# Patient Record
Sex: Female | Born: 1970 | Race: White | Hispanic: No | Smoking: Former smoker
Health system: Southern US, Community
[De-identification: ages and names within clinical notes are randomized; demographics above are authoritative.]

## PROBLEM LIST (undated history)

## (undated) DIAGNOSIS — Z9889 Other specified postprocedural states: Secondary | ICD-10-CM

## (undated) DIAGNOSIS — Z915 Personal history of self-harm: Secondary | ICD-10-CM

## (undated) DIAGNOSIS — R112 Nausea with vomiting, unspecified: Secondary | ICD-10-CM

## (undated) DIAGNOSIS — Z973 Presence of spectacles and contact lenses: Secondary | ICD-10-CM

## (undated) DIAGNOSIS — M793 Panniculitis, unspecified: Secondary | ICD-10-CM

## (undated) DIAGNOSIS — K219 Gastro-esophageal reflux disease without esophagitis: Secondary | ICD-10-CM

## (undated) DIAGNOSIS — F419 Anxiety disorder, unspecified: Secondary | ICD-10-CM

## (undated) DIAGNOSIS — I10 Essential (primary) hypertension: Secondary | ICD-10-CM

## (undated) DIAGNOSIS — N641 Fat necrosis of breast: Secondary | ICD-10-CM

## (undated) DIAGNOSIS — M1990 Unspecified osteoarthritis, unspecified site: Secondary | ICD-10-CM

## (undated) DIAGNOSIS — F329 Major depressive disorder, single episode, unspecified: Secondary | ICD-10-CM

## (undated) DIAGNOSIS — Z8719 Personal history of other diseases of the digestive system: Secondary | ICD-10-CM

## (undated) DIAGNOSIS — L409 Psoriasis, unspecified: Secondary | ICD-10-CM

## (undated) DIAGNOSIS — J189 Pneumonia, unspecified organism: Secondary | ICD-10-CM

## (undated) HISTORY — DX: Unspecified osteoarthritis, unspecified site: M19.90

## (undated) HISTORY — PX: LAPAROSCOPIC REPAIR AND REMOVAL OF GASTRIC BAND: SHX5919

## (undated) HISTORY — PX: TOTAL ABDOMINAL HYSTERECTOMY W/ BILATERAL SALPINGOOPHORECTOMY: SHX83

## (undated) HISTORY — PX: ACHILLES TENDON REPAIR: SUR1153

## (undated) HISTORY — PX: COMBINED HYSTEROSCOPY DIAGNOSTIC / D&C: SUR297

## (undated) HISTORY — PX: LAPAROSCOPIC GASTRIC BANDING: SHX1100

## (undated) HISTORY — PX: CARPOMETACARPAL (CMC) FUSION OF THUMB: SHX6290

## (undated) HISTORY — PX: LAPAROSCOPIC CHOLECYSTECTOMY: SUR755

## (undated) HISTORY — PX: TONSILLECTOMY: SUR1361

---
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
## 1998-09-13 ENCOUNTER — Ambulatory Visit (HOSPITAL_COMMUNITY): Admission: RE | Admit: 1998-09-13 | Discharge: 1998-09-13 | Payer: Self-pay | Admitting: Family Medicine

## 1998-09-13 ENCOUNTER — Encounter: Payer: Self-pay | Admitting: Family Medicine

## 1999-08-19 ENCOUNTER — Other Ambulatory Visit: Admission: RE | Admit: 1999-08-19 | Discharge: 1999-08-19 | Payer: Self-pay | Admitting: Obstetrics and Gynecology

## 2000-01-31 ENCOUNTER — Encounter (INDEPENDENT_AMBULATORY_CARE_PROVIDER_SITE_OTHER): Payer: Self-pay

## 2000-01-31 ENCOUNTER — Inpatient Hospital Stay (HOSPITAL_COMMUNITY): Admission: RE | Admit: 2000-01-31 | Discharge: 2000-02-02 | Payer: Self-pay | Admitting: Obstetrics and Gynecology

## 2000-09-29 ENCOUNTER — Other Ambulatory Visit: Admission: RE | Admit: 2000-09-29 | Discharge: 2000-09-29 | Payer: Self-pay | Admitting: Obstetrics and Gynecology

## 2000-11-07 ENCOUNTER — Emergency Department (HOSPITAL_COMMUNITY): Admission: EM | Admit: 2000-11-07 | Discharge: 2000-11-07 | Payer: Self-pay | Admitting: Emergency Medicine

## 2000-12-05 ENCOUNTER — Emergency Department (HOSPITAL_COMMUNITY): Admission: EM | Admit: 2000-12-05 | Discharge: 2000-12-05 | Payer: Self-pay | Admitting: Emergency Medicine

## 2001-04-12 ENCOUNTER — Other Ambulatory Visit: Admission: RE | Admit: 2001-04-12 | Discharge: 2001-04-12 | Payer: Self-pay | Admitting: Obstetrics and Gynecology

## 2001-05-10 ENCOUNTER — Encounter (INDEPENDENT_AMBULATORY_CARE_PROVIDER_SITE_OTHER): Payer: Self-pay | Admitting: *Deleted

## 2001-05-10 ENCOUNTER — Ambulatory Visit (HOSPITAL_BASED_OUTPATIENT_CLINIC_OR_DEPARTMENT_OTHER): Admission: RE | Admit: 2001-05-10 | Discharge: 2001-05-10 | Payer: Self-pay | Admitting: Plastic Surgery

## 2001-08-20 ENCOUNTER — Other Ambulatory Visit: Admission: RE | Admit: 2001-08-20 | Discharge: 2001-08-20 | Payer: Self-pay | Admitting: Obstetrics and Gynecology

## 2002-06-29 ENCOUNTER — Encounter: Payer: Self-pay | Admitting: Obstetrics and Gynecology

## 2002-06-29 ENCOUNTER — Ambulatory Visit (HOSPITAL_COMMUNITY): Admission: RE | Admit: 2002-06-29 | Discharge: 2002-06-29 | Payer: Self-pay | Admitting: Obstetrics and Gynecology

## 2002-07-10 ENCOUNTER — Ambulatory Visit (HOSPITAL_COMMUNITY): Admission: RE | Admit: 2002-07-10 | Discharge: 2002-07-10 | Payer: Self-pay | Admitting: Obstetrics and Gynecology

## 2002-07-10 ENCOUNTER — Encounter: Payer: Self-pay | Admitting: Obstetrics and Gynecology

## 2002-09-21 ENCOUNTER — Ambulatory Visit (HOSPITAL_COMMUNITY): Admission: RE | Admit: 2002-09-21 | Discharge: 2002-09-21 | Payer: Self-pay | Admitting: Family Medicine

## 2002-09-21 ENCOUNTER — Encounter: Payer: Self-pay | Admitting: Family Medicine

## 2002-10-19 ENCOUNTER — Observation Stay (HOSPITAL_COMMUNITY): Admission: RE | Admit: 2002-10-19 | Discharge: 2002-10-20 | Payer: Self-pay

## 2002-10-19 ENCOUNTER — Encounter (INDEPENDENT_AMBULATORY_CARE_PROVIDER_SITE_OTHER): Payer: Self-pay | Admitting: Specialist

## 2002-11-23 ENCOUNTER — Emergency Department (HOSPITAL_COMMUNITY): Admission: EM | Admit: 2002-11-23 | Discharge: 2002-11-23 | Payer: Self-pay | Admitting: Emergency Medicine

## 2002-11-25 ENCOUNTER — Encounter: Admission: RE | Admit: 2002-11-25 | Discharge: 2002-11-25 | Payer: Self-pay | Admitting: Surgery

## 2002-11-25 ENCOUNTER — Encounter: Payer: Self-pay | Admitting: Surgery

## 2002-11-28 ENCOUNTER — Encounter: Admission: RE | Admit: 2002-11-28 | Discharge: 2002-11-28 | Payer: Self-pay | Admitting: Surgery

## 2002-11-28 ENCOUNTER — Encounter: Payer: Self-pay | Admitting: Surgery

## 2003-03-13 ENCOUNTER — Ambulatory Visit (HOSPITAL_COMMUNITY): Admission: RE | Admit: 2003-03-13 | Discharge: 2003-03-13 | Payer: Self-pay | Admitting: Obstetrics and Gynecology

## 2003-06-23 ENCOUNTER — Encounter (INDEPENDENT_AMBULATORY_CARE_PROVIDER_SITE_OTHER): Payer: Self-pay | Admitting: Specialist

## 2003-06-23 ENCOUNTER — Ambulatory Visit (HOSPITAL_COMMUNITY): Admission: RE | Admit: 2003-06-23 | Discharge: 2003-06-23 | Payer: Self-pay | Admitting: Obstetrics and Gynecology

## 2003-09-13 ENCOUNTER — Encounter: Admission: RE | Admit: 2003-09-13 | Discharge: 2003-12-12 | Payer: Self-pay | Admitting: *Deleted

## 2003-09-18 ENCOUNTER — Ambulatory Visit (HOSPITAL_COMMUNITY): Admission: RE | Admit: 2003-09-18 | Discharge: 2003-09-18 | Payer: Self-pay | Admitting: Surgery

## 2003-09-25 ENCOUNTER — Inpatient Hospital Stay (HOSPITAL_COMMUNITY): Admission: RE | Admit: 2003-09-25 | Discharge: 2003-09-27 | Payer: Self-pay | Admitting: *Deleted

## 2003-12-08 ENCOUNTER — Ambulatory Visit (HOSPITAL_COMMUNITY): Admission: RE | Admit: 2003-12-08 | Discharge: 2003-12-08 | Payer: Self-pay | Admitting: Obstetrics and Gynecology

## 2003-12-26 ENCOUNTER — Encounter (INDEPENDENT_AMBULATORY_CARE_PROVIDER_SITE_OTHER): Payer: Self-pay | Admitting: Specialist

## 2003-12-26 ENCOUNTER — Inpatient Hospital Stay (HOSPITAL_COMMUNITY): Admission: RE | Admit: 2003-12-26 | Discharge: 2003-12-28 | Payer: Self-pay | Admitting: Obstetrics and Gynecology

## 2004-02-27 ENCOUNTER — Ambulatory Visit (HOSPITAL_COMMUNITY): Admission: RE | Admit: 2004-02-27 | Discharge: 2004-02-27 | Payer: Self-pay | Admitting: Obstetrics and Gynecology

## 2004-04-07 HISTORY — PX: VAGOTOMY: SUR1431

## 2004-04-23 ENCOUNTER — Encounter: Admission: RE | Admit: 2004-04-23 | Discharge: 2004-04-23 | Payer: Self-pay | Admitting: *Deleted

## 2004-04-23 ENCOUNTER — Inpatient Hospital Stay (HOSPITAL_COMMUNITY): Admission: AD | Admit: 2004-04-23 | Discharge: 2004-04-25 | Payer: Self-pay | Admitting: *Deleted

## 2004-05-02 ENCOUNTER — Encounter: Admission: RE | Admit: 2004-05-02 | Discharge: 2004-07-31 | Payer: Self-pay | Admitting: *Deleted

## 2004-06-17 ENCOUNTER — Encounter: Admission: RE | Admit: 2004-06-17 | Discharge: 2004-06-17 | Payer: Self-pay | Admitting: *Deleted

## 2004-08-22 ENCOUNTER — Ambulatory Visit (HOSPITAL_COMMUNITY): Admission: RE | Admit: 2004-08-22 | Discharge: 2004-08-22 | Payer: Self-pay | Admitting: Surgery

## 2004-08-29 ENCOUNTER — Encounter: Admission: RE | Admit: 2004-08-29 | Discharge: 2004-08-29 | Payer: Self-pay | Admitting: *Deleted

## 2004-09-20 ENCOUNTER — Encounter: Admission: RE | Admit: 2004-09-20 | Discharge: 2004-12-19 | Payer: Self-pay | Admitting: *Deleted

## 2004-11-05 ENCOUNTER — Inpatient Hospital Stay (HOSPITAL_COMMUNITY): Admission: RE | Admit: 2004-11-05 | Discharge: 2004-11-07 | Payer: Self-pay | Admitting: *Deleted

## 2004-11-05 HISTORY — PX: OTHER SURGICAL HISTORY: SHX169

## 2004-12-19 ENCOUNTER — Encounter: Admission: RE | Admit: 2004-12-19 | Discharge: 2004-12-19 | Payer: Self-pay | Admitting: *Deleted

## 2004-12-20 ENCOUNTER — Ambulatory Visit (HOSPITAL_COMMUNITY): Admission: RE | Admit: 2004-12-20 | Discharge: 2004-12-20 | Payer: Self-pay | Admitting: Surgery

## 2005-02-21 ENCOUNTER — Encounter: Admission: RE | Admit: 2005-02-21 | Discharge: 2005-04-06 | Payer: Self-pay | Admitting: *Deleted

## 2005-04-18 ENCOUNTER — Encounter: Admission: RE | Admit: 2005-04-18 | Discharge: 2005-07-17 | Payer: Self-pay | Admitting: *Deleted

## 2005-11-18 ENCOUNTER — Encounter: Admission: RE | Admit: 2005-11-18 | Discharge: 2005-11-18 | Payer: Self-pay | Admitting: *Deleted

## 2005-11-27 ENCOUNTER — Ambulatory Visit (HOSPITAL_COMMUNITY): Admission: RE | Admit: 2005-11-27 | Discharge: 2005-11-27 | Payer: Self-pay | Admitting: Obstetrics and Gynecology

## 2005-12-02 ENCOUNTER — Ambulatory Visit: Admission: RE | Admit: 2005-12-02 | Discharge: 2005-12-02 | Payer: Self-pay | Admitting: Gynecologic Oncology

## 2005-12-23 ENCOUNTER — Inpatient Hospital Stay (HOSPITAL_COMMUNITY): Admission: RE | Admit: 2005-12-23 | Discharge: 2005-12-25 | Payer: Self-pay | Admitting: Obstetrics and Gynecology

## 2005-12-23 ENCOUNTER — Encounter (INDEPENDENT_AMBULATORY_CARE_PROVIDER_SITE_OTHER): Payer: Self-pay | Admitting: *Deleted

## 2006-01-19 ENCOUNTER — Ambulatory Visit (HOSPITAL_COMMUNITY): Admission: RE | Admit: 2006-01-19 | Discharge: 2006-01-19 | Payer: Self-pay | Admitting: Surgery

## 2006-01-26 ENCOUNTER — Inpatient Hospital Stay (HOSPITAL_COMMUNITY): Admission: EM | Admit: 2006-01-26 | Discharge: 2006-01-28 | Payer: Self-pay | Admitting: Emergency Medicine

## 2006-02-05 ENCOUNTER — Inpatient Hospital Stay (HOSPITAL_COMMUNITY): Admission: RE | Admit: 2006-02-05 | Discharge: 2006-02-09 | Payer: Self-pay | Admitting: Surgery

## 2008-02-13 ENCOUNTER — Emergency Department (HOSPITAL_COMMUNITY): Admission: EM | Admit: 2008-02-13 | Discharge: 2008-02-13 | Payer: Self-pay | Admitting: Emergency Medicine

## 2008-02-21 ENCOUNTER — Encounter: Admission: RE | Admit: 2008-02-21 | Discharge: 2008-02-21 | Payer: Self-pay | Admitting: Family Medicine

## 2008-11-16 ENCOUNTER — Emergency Department (HOSPITAL_COMMUNITY): Admission: EM | Admit: 2008-11-16 | Discharge: 2008-11-16 | Payer: Self-pay | Admitting: Podiatry

## 2009-08-30 ENCOUNTER — Emergency Department (HOSPITAL_COMMUNITY): Admission: EM | Admit: 2009-08-30 | Discharge: 2009-08-30 | Payer: Self-pay | Admitting: Emergency Medicine

## 2009-12-17 ENCOUNTER — Inpatient Hospital Stay (HOSPITAL_COMMUNITY): Admission: EM | Admit: 2009-12-17 | Discharge: 2009-12-30 | Payer: Self-pay | Admitting: Emergency Medicine

## 2009-12-27 ENCOUNTER — Encounter (INDEPENDENT_AMBULATORY_CARE_PROVIDER_SITE_OTHER): Payer: Self-pay | Admitting: Surgery

## 2009-12-27 HISTORY — PX: UMBILICAL HERNIA REPAIR: SHX196

## 2010-06-20 LAB — BASIC METABOLIC PANEL
BUN: 5 mg/dL — ABNORMAL LOW (ref 6–23)
BUN: 5 mg/dL — ABNORMAL LOW (ref 6–23)
CO2: 26 mEq/L (ref 19–32)
CO2: 30 mEq/L (ref 19–32)
CO2: 31 mEq/L (ref 19–32)
CO2: 31 mEq/L (ref 19–32)
CO2: 31 mEq/L (ref 19–32)
Calcium: 8.6 mg/dL (ref 8.4–10.5)
Calcium: 8.7 mg/dL (ref 8.4–10.5)
Calcium: 8.7 mg/dL (ref 8.4–10.5)
Calcium: 8.7 mg/dL (ref 8.4–10.5)
Calcium: 9 mg/dL (ref 8.4–10.5)
Calcium: 9 mg/dL (ref 8.4–10.5)
Chloride: 102 mEq/L (ref 96–112)
Chloride: 103 mEq/L (ref 96–112)
Chloride: 105 mEq/L (ref 96–112)
Chloride: 105 mEq/L (ref 96–112)
Chloride: 107 mEq/L (ref 96–112)
Creatinine, Ser: 1 mg/dL (ref 0.4–1.2)
Creatinine, Ser: 1.06 mg/dL (ref 0.4–1.2)
Creatinine, Ser: 1.14 mg/dL (ref 0.4–1.2)
GFR calc Af Amer: >60 mL/min (ref >60)
GFR calc Af Amer: >60 mL/min (ref >60)
GFR calc Af Amer: >60 mL/min (ref >60)
GFR calc Af Amer: >60 mL/min (ref >60)
GFR calc Af Amer: >60 mL/min (ref >60)
GFR calc Af Amer: >60 mL/min (ref >60)
GFR calc non Af Amer: >60 mL/min (ref >60)
Glucose, Bld: 112 mg/dL — ABNORMAL HIGH (ref 70–99)
Glucose, Bld: 118 mg/dL — ABNORMAL HIGH (ref 70–99)
Potassium: 4 mEq/L (ref 3.5–5.1)
Potassium: 4.2 mEq/L (ref 3.5–5.1)
Potassium: 4.6 mEq/L (ref 3.5–5.1)
Sodium: 139 mEq/L (ref 135–145)
Sodium: 140 mEq/L (ref 135–145)
Sodium: 140 mEq/L (ref 135–145)
Sodium: 142 mEq/L (ref 135–145)
Sodium: 142 mEq/L (ref 135–145)

## 2010-06-20 LAB — CBC
HCT: 39.1 % (ref 36.0–46.0)
HCT: 39.8 % (ref 36.0–46.0)
HCT: 44.5 % (ref 36.0–46.0)
Hemoglobin: 13.5 g/dL (ref 12.0–15.0)
Hemoglobin: 13.8 g/dL (ref 12.0–15.0)
Hemoglobin: 13.9 g/dL (ref 12.0–15.0)
Hemoglobin: 14.7 g/dL (ref 12.0–15.0)
Hemoglobin: 15.6 g/dL — ABNORMAL HIGH (ref 12.0–15.0)
MCH: 33 pg (ref 26.0–34.0)
MCH: 33.1 pg (ref 26.0–34.0)
MCH: 33.2 pg (ref 26.0–34.0)
MCH: 33.5 pg (ref 26.0–34.0)
MCH: 33.4 pg (ref 26.0–34.0)
MCHC: 34.2 g/dL (ref 30.0–36.0)
MCHC: 34.5 g/dL (ref 30.0–36.0)
MCHC: 34.9 g/dL (ref 30.0–36.0)
MCV: 95.9 fL (ref 78.0–100.0)
MCV: 96.3 fL (ref 78.0–100.0)
MCV: 96.4 fL (ref 78.0–100.0)
MCV: 96.8 fL (ref 78.0–100.0)
MCV: 95.6 fL (ref 78.0–100.0)
MCV: 95.6 fL (ref 78.0–100.0)
MCV: 96.5 fL (ref 78.0–100.0)
Platelets: 164 10*3/uL (ref 150–400)
Platelets: 173 10*3/uL (ref 150–400)
Platelets: 183 10*3/uL (ref 150–400)
Platelets: 186 10*3/uL (ref 150–400)
Platelets: 164 10*3/uL (ref 150–400)
Platelets: 170 10*3/uL (ref 150–400)
RBC: 4.04 MIL/uL (ref 3.87–5.11)
RBC: 4.06 MIL/uL (ref 3.87–5.11)
RBC: 4.14 MIL/uL (ref 3.87–5.11)
RBC: 4.14 MIL/uL (ref 3.87–5.11)
RBC: 4.38 MIL/uL (ref 3.87–5.11)
RBC: 4.26 MIL/uL (ref 3.87–5.11)
RBC: 4.66 MIL/uL (ref 3.87–5.11)
RDW: 13.6 % (ref 11.5–15.5)
RDW: 12.8 % (ref 11.5–15.5)
RDW: 13.6 % (ref 11.5–15.5)
WBC: 6.5 10*3/uL (ref 4.0–10.5)
WBC: 6.9 10*3/uL (ref 4.0–10.5)
WBC: 7.4 10*3/uL (ref 4.0–10.5)
WBC: 7.7 10*3/uL (ref 4.0–10.5)
WBC: 7.9 10*3/uL (ref 4.0–10.5)

## 2010-06-20 LAB — DIFFERENTIAL
Basophils Absolute: 0 10*3/uL (ref 0.0–0.1)
Basophils Absolute: 0.1 10*3/uL (ref 0.0–0.1)
Basophils Relative: 1 % (ref 0–1)
Eosinophils Absolute: 0.2 10*3/uL (ref 0.0–0.7)
Eosinophils Absolute: 0.2 10*3/uL (ref 0.0–0.7)
Eosinophils Absolute: 0.2 10*3/uL (ref 0.0–0.7)
Eosinophils Relative: 4 % (ref 0–5)
Eosinophils Relative: 2 % (ref 0–5)
Eosinophils Relative: 3 % (ref 0–5)
Lymphocytes Relative: 36 % (ref 12–46)
Lymphocytes Relative: 28 % (ref 12–46)
Lymphs Abs: 2.4 10*3/uL (ref 0.7–4.0)
Lymphs Abs: 2.8 10*3/uL (ref 0.7–4.0)
Lymphs Abs: 2.9 10*3/uL (ref 0.7–4.0)
Monocytes Absolute: 0.4 10*3/uL (ref 0.1–1.0)
Monocytes Relative: 5 % (ref 3–12)
Neutrophils Relative %: 54 % (ref 43–77)
Neutrophils Relative %: 65 % (ref 43–77)

## 2010-06-20 LAB — URINE CULTURE: Culture  Setup Time: 201109140522

## 2010-06-20 LAB — URINALYSIS, ROUTINE W REFLEX MICROSCOPIC
Bilirubin Urine: NEGATIVE
Glucose, UA: NEGATIVE mg/dL
Glucose, UA: NEGATIVE mg/dL
Hgb urine dipstick: NEGATIVE
Ketones, ur: NEGATIVE mg/dL
Leukocytes, UA: NEGATIVE
Protein, ur: NEGATIVE mg/dL
pH: 5 (ref 5.0–8.0)
pH: 5.5 (ref 5.0–8.0)

## 2010-06-20 LAB — COMPREHENSIVE METABOLIC PANEL
ALT: 23 U/L (ref 0–35)
AST: 25 U/L (ref 0–37)
Albumin: 3.2 g/dL — ABNORMAL LOW (ref 3.5–5.2)
Alkaline Phosphatase: 60 U/L (ref 39–117)
BUN: 10 mg/dL (ref 6–23)
CO2: 28 mEq/L (ref 19–32)
Calcium: 9 mg/dL (ref 8.4–10.5)
Chloride: 105 mEq/L (ref 96–112)
Chloride: 107 mEq/L (ref 96–112)
Creatinine, Ser: 0.93 mg/dL (ref 0.4–1.2)
GFR calc non Af Amer: >60 mL/min (ref >60)
Glucose, Bld: 95 mg/dL (ref 70–99)
Potassium: 4 mEq/L (ref 3.5–5.1)
Sodium: 140 mEq/L (ref 135–145)
Total Bilirubin: 0.4 mg/dL (ref 0.3–1.2)
Total Bilirubin: 0.7 mg/dL (ref 0.3–1.2)
Total Protein: 6.2 g/dL (ref 6.0–8.3)

## 2010-06-20 LAB — LIPASE, BLOOD: Lipase: 22 U/L (ref 11–59)

## 2010-07-13 LAB — CBC
Platelets: 129 10*3/uL — ABNORMAL LOW (ref 150–400)
RDW: 12.4 % (ref 11.5–15.5)
WBC: 11.3 10*3/uL — ABNORMAL HIGH (ref 4.0–10.5)

## 2010-07-13 LAB — BASIC METABOLIC PANEL
BUN: 11 mg/dL (ref 6–23)
Calcium: 8.9 mg/dL (ref 8.4–10.5)
GFR calc non Af Amer: >60 mL/min (ref >60)
Glucose, Bld: 99 mg/dL (ref 70–99)
Sodium: 137 mEq/L (ref 135–145)

## 2010-07-13 LAB — DIFFERENTIAL
Basophils Absolute: 0.1 10*3/uL (ref 0.0–0.1)
Lymphocytes Relative: 21 % (ref 12–46)
Neutro Abs: 8.2 10*3/uL — ABNORMAL HIGH (ref 1.7–7.7)

## 2010-08-23 NOTE — Op Note (Signed)
[name], [name]              ACCOUNT NO.:  [id number]   MEDICAL RECORD NO.:  [id number]          PATIENT TYPE:  AMB   LOCATION:  ENDO                         FACILITY:  [facility]   PHYSICIAN:  Sandria Bales. Ezzard Standing, M.D.  DATE OF BIRTH:  1970/08/14   DATE OF PROCEDURE:  12/20/2004  DATE OF DISCHARGE:                                 OPERATIVE REPORT   PREOPERATIVE DIAGNOSIS:  Status post laparoscopic banding procedure and  repair of a hiatal hernia x3 with what appears to be pouch outlet  obstruction.   POSTOPERATIVE DIAGNOSIS:  1.  Status post laparoscopic banding procedure with history of hiatal hernia      repair.  2.  Mildly dilated gastric pouch with esophagogastric junction at      approximately 40 cm, and outlet to pouch at about 45 cm.   PROCEDURE:  Esophagogastroscopy with dilatation to 15 mm using a balloon  dilator.  Access lap band port.   SURGEON:  Sandria Bales. Ezzard Standing, M.D.   FIRST ASSISTANT:  None.   ANESTHESIA:  Demerol 50 mg, Versed 5 mg.   INDICATIONS FOR PROCEDURE:  [name] is a 40 year old white female who  underwent a laparoscopic repair of a hiatal hernia and laparoscopic  adjustable gastric banding by Dr. Luan Pulling in June, 2005.  She had a  recurrence of a hiatal hernia, and [name] repaired this laparoscopically  of January, 2006.  She had another recurrence of a hiatal hernia, and this  was repaired for the third time on November 05, 2004, so she is approximately  six weeks postop.  As far as I am aware, the band has never been replaced  since it was initially placed.   The band has also not been filled post op.  I accessed the port to the lap  band before beginning the endoscopy to confirm that there was no fluid in th  band.  And there was no fluid in the band.   She has had successful weight loss from her bariatric surgery which exceeded  110 pounds, but over the last week, she has had progressive nausea and  vomiting of almost everything except  for the most thin of fluids.   OPERATIVE NOTE:  She presents to the endoscopy suite for upper endoscopy.  She has been n.p.o.  The indications and potential complications have  already been explained to the patient.  An IV is started in her right wrist,  and she is monitored with a pulse oximetry, blood pressure cuff, EKG, and  had nasal oxygen flow during the procedure.   I anesthetized the back of her throat with Cetacaine.  I used an Olympus  endoscope which was passed down her esophagus into the gastric pouch and  into the stomach and beyond into the pylorus and duodenum.   The duodenum was normal.  The pylorus, although somewhat tight, was normal  without ulcer or mass.  Her stomach pouch was unremarkable.  I retroflexed  the scope in the stomach.  I could see the imprint of the band on the  proximal stomach but saw no ulcers or  abnormality of the band location.  As  I pulled the scope through the opening between the proximal gastric patch  and the stomach, this was narrowed but not overly tight.  Actually, the  scope went in fairly easily.  The Olympus endoscope is approximately 9 mm in  diamter.  I saw no ulcer.  No evidence of erosion.  The gastric pouch itself  was mildly dilated, but I would say minimally so.   Again, the outlet of her pouch was at 45.  Her esophagogastric junction was  at 40.  I saw no evidence of a hiatel hernia.  I took a 15 mm dilating  balloon, blew it up in the stomach, pulled it back into the outlet between  the gastric pouch and stomach.  The balloon actually came through fairly  easily.  I left the balloon in the lumen of the opening betweeen the pouch  and stomach for about 90 seconds to try to dilate the opening.  [name]  tolerated the procedure well.   I would be very hesitant to take her band down at this time to see how she  does with her diet.  She is already on an antiulcer medicine, which she will  continue.   I talked to Durenda Hurt at the end of the procedure.  Her family was  paged, but was never available.  I will speak to Dr. Luan Pulling about these  findings also.   [Her husband did call me and I talked to him regarding the findings.]      Sandria Bales. Ezzard Standing, M.D.  Electronically Signed     DHN/MEDQ  D:  12/20/2004  T:  12/20/2004  Job:  [id number]   cc:   Vikki Ports, MD  [address], [address]  [place]  [place] [address]

## 2010-08-23 NOTE — Op Note (Signed)
NAMEKIMORA, [name]              ACCOUNT NO.:  [id number]   MEDICAL RECORD NO.:  [id number]          PATIENT TYPE:  OBV   LOCATION:  0098                         FACILITY:  [facility]   PHYSICIAN:  Thornton Park. Daphine Deutscher, MD  DATE OF BIRTH:  03-17-1971   DATE OF PROCEDURE:  04/23/2004  DATE OF DISCHARGE:                                 OPERATIVE REPORT   PREOPERATIVE DIAGNOSIS:  Recurrent hiatal hernia in a patient with a  laparoscopic gastric band.   POSTOPERATIVE DIAGNOSIS:  Recurrent type 3 mixed hiatal hernia in a  laparoscopic gastric band patient.   PROCEDURES:  Laparoscopic repair of type 3 hiatal hernia with pledgeted  sutures posteriorly and a micromesh Gore-Tex patch anteriorly.   SURGEON:  Thornton Park. Daphine Deutscher, M.D.   ASSISTANTS:  1.  Sandria Bales. Ezzard Standing, M.D.  2.  Vikki Ports, M.D.   ANESTHESIA:  General endotracheal.   ESTIMATED BLOOD LOSS:  35 mL.   COMPLICATIONS:  None noted.   ANCILLARY PROCEDURES:  Endoscopy per Dr. Ezzard Standing dictated separately.   DESCRIPTION OF PROCEDURE:  [name] is a 40 year old lady who  previously had a large hiatal hernia which was repaired at the same time as  she underwent laparoscopic gastric banding.  This was done back in June of  2005.  Apparently over the weekend she got some kind of virus with nausea  and vomiting and then had protracted abdominal pain.  She had an upper GI  which was reviewed by me and Dr. Luan Pulling and was found to be consistent  with recurrent hiatal hernia with stomach about the band and the chest.   The patient had actually been scheduled and permitted by Dr. Luan Pulling, who  was operating, but has had some orthopedic injuries and as her assistant and  partner, she asked me to perform the case.  Thus, after we initially put in  our trocars for the case, I went ahead and assumed that responsibility.  Access to the abdomen was gained with an Optiview technique in the left  upper quadrant.  Then  multiple ports were placed to avoid the previously  placed port for the laparoscopic gastric band.  A Nathanson retractor was  inserted.  We first began by taking down a lot of adhesions to the liver and  then delineating the right and left crux and dissecting free the hernia sac  and getting the herniated stomach out of the chest.  I was then able to  dissected posteriorly, put a Penrose drain around the esophagogastric  junction above the band and thereby able to further dissect the crura.  Once  this had been performed, I felt like the stomach was in the abdomen and the  esophagus was freed and was under no tension.  No pledgets had been used  before and I placed the three pledgets posteriorly, the sutures  approximately in the posterior crura, but there still remained a very large  anterior crural defect.  It was in fact anteriorly where her hernia had  recurred.   I felt that I would be unable to approximate this primarily with  pledgets  and sutures.  I cut a piece of micromesh Gore-Tex, placing the smooth side  in the abdomen and sutured it to the parameter with approximately six  sutures of Surgitek using the EndoStitch.  Then I sutured it down onto the  esophagus with Vicryl.  A nice repair was done.  At the end of the case, Dr.  Ezzard Standing endoscoped the patient.  We submerged it and there was no evidence of  any leaks.  Endoscopically everything appeared to be in order and the  stomach appeared to be below the diaphragm.  The abdomen was then deflated.  The patient was awakened and taken to the recovery room in satisfactory  condition.     Matt   MBM/MEDQ  D:  04/23/2004  T:  04/23/2004  Job:  [id number]

## 2010-08-23 NOTE — Op Note (Signed)
[name], PHILSON              ACCOUNT NO.:  [id number]   MEDICAL RECORD NO.:  [id number]          PATIENT TYPE:  INP   LOCATION:  1603                         FACILITY:  [facility]   PHYSICIAN:  Thornton Park. Daphine Deutscher, MD  DATE OF BIRTH:  26-Sep-1970   DATE OF PROCEDURE:  02/06/2006  DATE OF DISCHARGE:                                 OPERATIVE REPORT   PREOPERATIVE DIAGNOSIS:  Lap band with reflux and possible prolapse.   POSTOPERATIVE DIAGNOSIS:  Lap band with reflux and possible prolapse, lower  midline ventral hernia from prior Pfannenstiel incision and hernia from  upper abdominal port which was closed.   PROCEDURE:  Removal of lap band.   SURGEON:  [name]   ASSISTANT:  Dr. Ezzard Standing   DESCRIPTION OF PROCEDURE:  [name] was taken to room 1 on February 06, 2006, given general anesthesia.  The abdomen was prepped with chlorhexidine  and draped sterilely.  Access to the abdomen was gained through the upper  midline below the xiphoid with a 5 mm Optiview and insufflating and then  once insufflated, placing 11 mm ports below.  This enabled me to visualize  the band area.  There was a stroma which stuck out through the anterior  abdominal wall where I had previously plicated it, and the band was readily  visible and appeared that maybe it had slipped or she had herniated above  it.  We worked down on the port to the collar, and I was unable to unsnap  the collar and then pull out the band.  It was brought out in pieces, but it  came out nevertheless.  The stomach was left in situ.  The port site and the  band were removed as well as the tubing.  There was a dimple in the fascia  at this port site, and I went ahead and approximated it with a single #1  Novofil done under laparoscopic vision.  No bleeding or injuries were noted.  The abdomen was deflated, and the wounds were closed with 4-0 Vicryl with  Benzoin and Steri-Strips.  The patient seemed to tolerate the procedure well  and  was taken to the recovery room in satisfactory condition.      Thornton Park Daphine Deutscher, MD  Electronically Signed     MBM/MEDQ  D:  02/06/2006  T:  02/06/2006  Job:  [id number]

## 2010-08-23 NOTE — Op Note (Signed)
[name], [name]              ACCOUNT NO.:  [id number]   MEDICAL RECORD NO.:  [id number]          PATIENT TYPE:  AMB   LOCATION:  ENDO                         FACILITY:  [facility]   PHYSICIAN:  Sandria Bales. Ezzard Standing, M.D.  DATE OF BIRTH:  1971-04-01   DATE OF PROCEDURE:  01/19/2006  DATE OF DISCHARGE:                                 OPERATIVE REPORT   PREOPERATIVE DIAGNOSIS:  Nausea and vomiting, status post lap Band and prior  hiatel hernia repair.   POSTOPERATIVE DIAGNOSES:  1. Distal esophagitis with small ulcers.  2. Retained fluid in gastric pouch (consistent with poor gastric      emptying).  3. Tight, but not occluded gastrice orifice at level of Lap Band.  4. Bile reflux into stomach.  5. Gastroesophageal junction at 40 cm, with minimal, if any, hiatal      hernia.   OPERATION PERFORMED:  Upper esophagogastroduodenoscopy.   SURGEON:  Sandria Bales. Ezzard Standing, M.D.   FIRST ASSISTANT:  None.   ANESTHESIA:  Demerol 50 mg, Versed 4 mg.   INDICATIONS FOR PROCEDURE:  [name] is a 40 year old white female, who  underwent an initial Lap-Band with a hiatal hernia repair in June 2005 for  morbid obesity.  This was done by Dr. Colin Benton.  She underwent her first  revision by Dr. Wenda Low in January 2006, and a second revision in August  2006, again [name].  I last endoscoped [name] on 20 December 2004.  She has had persistent nausea and vomiting daily with most meals that she  eats.  She has sustained weight loss and is at 169 pounds; so, from a weight  loss standpoint, her surgery has been successful.  But from a chronic nausea  and vomiting standpoint, her surgery has been unsuccessful.   She underwent an upper GI on 14 August, which showed a Band, which appeared  to be in good position.  There was a somewhat generous pouch and trickling  emptying of her gastric outlet.   The patient now comes for upper endoscopy to try to better document, if  possible, what is going on  with her band/pouch.   OPERATIVE NOTE:  The patient was in a supine position with the back of her  throat anesthetized with Cetacaine spray, being rolled to her left side.  She had a pulse oximetry, EKG, blood pressure cuff, and was given nasal O2  during the procedure.   A flexible Olympus endoscope was passed into the back of her throat without  difficulty.  This was advanced down her esophagus.  I got into her stomach  pouch, which seemed large from a Band standpoint; and my guess is that at  least some gastric pouch dilatation has occured due to her repetitive nausea  and vomiting.  I was able to advance my scope through her gastrice orifice  of her Band.  Though it was tight, it permitted a scope, which was about 10  mm in diameter to go through without a lot of resistance.   Upon entering the stomach, I advanced into the duodenum, which is normal.  But in the stomach, I did notice that she had bile reflux in her stomach,  which is again consistent with her repetitive retching and vomiting.  I  retroflexed the scope, but I could see nothing wrong from the underneath  side of the Band, and on pulling through the Band, I saw no evidence of  ulceration in the band channel.  However, when I got back into the  stomach, it was noted the patient had a dilated gastric pouch.  When I first  entered the stomach, she did have retained fluid, which showed the pouch was  not emptying well.  Her Band placement was at about 45 cm from the incisors.  Her gastroesophageal junction was at 40 cm.  She did have some superficial  ulceration of her distal esophagus, consistent with gastroesophageal reflux  disease.   It is hard to know what to suggest for Colleen Wilshire.  She has had successful  weight loss, but clearly is compromised by her chronic nausea and vomiting.  I am not sure anything can be done with the Band that is in place to make it  behave better.  My guess is that she may even have  slippage of the Band, or  she has pouch dilatation.  She has already had three operations of her upper  abdomen, so surgically adjusting her Band placement will not be easy.  I  guess the question is, do we take the whole Band out and see how she does  with it out, or to try to do some kind of revision such as for a slip band,  replace her band with a larger band, or to try to do a bypass on her.  But I  do not have an answer to these questions at this time.      Sandria Bales. Ezzard Standing, M.D.  Electronically Signed     DHN/MEDQ  D:  01/19/2006  T:  01/19/2006  Job:  [id number]   cc:   Ernestina Penna, M.D.  Thornton Park Daphine Deutscher, MD

## 2010-08-23 NOTE — Op Note (Signed)
NAMEBILLEE, BALCERZAK              ACCOUNT NO.:  [id number]   MEDICAL RECORD NO.:  [id number]          PATIENT TYPE:  AMB   LOCATION:  ENDO                         [facility]AC[facility]   PHYSICIAN:  Sandria Bales. Ezzard Standing, M.D.  DATE OF BIRTH:  [birth year]   DATE OF PROCEDURE:  08/22/2004  DATE OF DISCHARGE:                                 OPERATIVE REPORT   PREOPERATIVE DIAGNOSES:  Status post laparoscopic banding procedure and  repair of hiatal hernia with reflux esophagitis symptoms.   POSTOPERATIVE DIAGNOSIS:  1.  Status post laparoscopic banding procedure with history of repair of      hiatal hernia.  2.  Dilated gastric pouch with esophagogastric junction at 35 cm, and severe      erosive esophagitis of distal esophagus.   PROCEDURE:  Esophagogastroduodenoscopy with CLO biopsy of proximal stomach  pouch.   SURGEON:  Dr. Ezzard Standing   FIRST ASSISTANT:  None.   ANESTHESIA:  1.  Demerol 50 mg.  2.  Versed 5 mg.   COMPLICATIONS:  None.   INDICATIONS FOR PROCEDURE:  Ms. Brede is a 40 year old white female, who  underwent a laparoscopic repair of a hiatal hernia and laparoscopic  adjustable gastric banding by Dr. Danna Hefty the 20th of June 2005.  She had a recurrent type 3 hiatal hernia which was repaired laparoscopically  by Dr. Wenda Low on the 17th of January 2006.  She has had successful  weight loss from her bariatric surgery; however, she has developed  increasing symptoms of reflux esophagitis with burning of her esophagus and  she feels that her food hangs in her esophagus.  These symptoms have  developed over the last 2-3 months.  She underwent an upper GI in March  2006, which showed a small residual hiatal hernia.  Everything looked in  stable position.   She has been NPO since midnight.  The indications and potential  complications explained to the patient.  She had an IV started in her left  wrist.  She was monitored with a pulse oximetry, blood pressure cuff,  EKGs,  and had nasal oxygen flow during the procedure.    OPERATIVE NOTE:   The back of her throat was anesthetized with Cetacaine, deadened to touch of  a finger.  I used the Q160 Olympus endoscope and passed this down into her  esophagus.   What I noticed initially was at 25-30 cm, she had significant erosions of  her mid to distal esophagus with retained food particles.  As I got down to  her esophagogastric junction, there was an angulation of the esophagus into  a stomach pouch.  The stomach pouch also had retained fluid and appeared  larger than a normal stomach pouch should have after a lap band.  But the  stomach size was hard to quantify because there was a greater curvature  which just sort of bowed out.  I then was able to pop through the band site  into the distal stomach.  I went into the duodenum which was unremarkable.  There was no evidence of any ulcer disease or pyloric obstruction.  The  distal stomach pouch was unremarkable for any ulcer inflammation.  I  retroflexed the scope and could see the band placement from below which,  again, looked all normal.  I pulled the scope up at the band site which was  40 cm from the incisors.  I popped the scope through the band site into the  stomach pouch, then pulled the scope up to the GE junction which was 35 cm  into the erosive esophagitis.   There was drag on pushing and pulling the scope through the band. I could  get the scope through the band , but it was tight getting through.  I am  guessing that the band is presenting a relative obstruction, particularly to  solid food, and this is leading to her reflux of retained food particles.   At the end of the procedure, I talked to her and her parents.  She is  already on Prevacid which we will continue at twice a day dosing.  I wrote  her for some Carafate to see if this will help some of the erosive  esophagitis.  I told her she needed to make sure with solid foods that  she  well-chewed or pureed and that any kind of particulate food may hang up and  cause problems.   She will be in touch with Dr. Luan Pulling.  Probably the first consideration  would be to repeat the upper GI to see if there has been any anatomic change  since the March, 2006, upper GI study and then consideration whether the  band can be adjusted or moved or ought to be removed or how she ought to be  handling it.  I did do a CLOtest biopsy of the stomach pouch which will be  sent off and analyzed.      DHN/MEDQ  D:  08/22/2004  T:  08/22/2004  Job:  [id number]   cc:   Deeann Cree., PA  [place]. Fam. Audelia Hives.   Ernestina Penna, M.D.  [address] [place]  [place] [address]  Fax: [phone]   Vikki Ports, MD  [phone]  [place]  [place] [address]

## 2010-08-23 NOTE — Consult Note (Signed)
[name], [name]              ACCOUNT NO.:  [id number]   MEDICAL RECORD NO.:  [id number]          PATIENT TYPE:  OUT   LOCATION:  GYN                          FACILITY:  [facility]   PHYSICIAN:  Paola A. Duard Brady, MD    DATE OF BIRTH:  04/05/1971   DATE OF CONSULTATION:  12/02/2005  DATE OF DISCHARGE:                                   CONSULTATION   HISTORY:  Ms. Alleman is a 40 year old gravida 0 whose last cycle was November 13, 2005.  She has a long history of dysmenorrhea, irregular cycles.  Her  irregular cycles have improved with her weight loss.  She has lost about 150  pounds after undergoing her lap band surgery in June 2005.  While her  irregular bleeding is no longer an issues, she continues to have significant  pain and dyspareunia.  She underwent surgery in September 2005, by Dr. Edward Jolly  for a right-sided endometrioma.  At that time, it was noted that she had  stage IV endometriosis with obliteration of her cul-de-sac.  She is desiring  definitive treatment consisting of a total abdominal hysterectomy and BSO.  Because of the concern of adhesive disease, Dr. Edward Jolly would like our service  involved.  Per the patient's report, she, other than the pain, is doing  well.  The pain has caused her to have to be out of work at this time.  She  denies any dyschesia.  She does have some lower quadrant pain with coughing.  She denies any significant change in her bowel or bladder habits.  She does  complain of dyspareunia.  She does have some nausea and vomiting which she  feels is secondary to her lap band surgery in 2005.  She denies any change  in the caliber of her stools, any post-coital bleeding, any rectal bleeding.   PAST SURGICAL HISTORY:  Right-sided endometrioma done via Pfannenstiel in  September 2005, lap band procedure done in June 2005.  She has had two  failed Nissen fundoplications.  She has had laparoscopic cholecystectomy.  She had a myomectomy in either 2002 or  2003.   PAST GYN HISTORY:  She has had normal Pap smears.  Her most recent one was  in August.  She does have a history of abnormal Paps in either 2002 or 2003.  No treatment was needed.  The Pap resolved with repeat.   PAST MEDICAL HISTORY:  Significant for her hiatal hernia.   MEDICATIONS:  Aciphex, hydrocodone p.r.n.   ALLERGIES:  None.   FAMILY HISTORY:  Significant for hypertension in her mother and father.  Her  father has diabetes and coronary disease.  She has a maternal aunt who died  of melanoma.  Hypercholesterol and coronary artery disease are rampant  through her family.   SOCIAL HISTORY:  She is married.  She smokes a pack per day.  She has done  for 10 days.  She denies use of alcohol.  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   PHYSICAL EXAMINATION:  VITAL SIGNS:  Height 5 feet 11 inches, weight 181  pounds.  Blood pressure 110/70, pulse 84.  GENERAL:  Well-nourished, well-developed female in no acute distress.  NECK:  Supple.  There is no lymphadenopathy.  No thyromegaly.  LUNGS:  Clear to auscultation bilaterally.  CARDIOVASCULAR:  Regular rate and rhythm.  ABDOMEN:  Notable for redundant skin and well-healed surgical incisions.  Abdomen is soft, nontender.  In the upper quadrant, she does have some  diffuse tenderness.  In the lower quadrants, there are no palpable masses or  hepatosplenomegaly.  Groins are negative for adenopathy.  EXTREMITIES:  There is no edema.  She does have notable varicosities in the  thighs primarily bilaterally, left greater than right.  PELVIC:  External genitalia is within normal limits.  The vagina is well  epithelialized.  The cervix is visualized.  There is no physiologic  discharge.  It is nulliparous.  There are no visible lesions.  Bimanual  examination is poorly tolerated by the patient.  There is some voluntary  guarding.  However, the corpus does not appear to be appreciably enlarged.  There are no adnexal  masses.  The parametria are soft and free.   ASSESSMENT:  [age]-year-old with history of endometriosis who has  chronic pelvic pain and desires to have definitive therapy consisting of a  TAH/BSO.  Secondary to concerns of adhesive disease, Dr. Edward Jolly would like to  have GYN oncology involved.  Risks of surgery, including bleeding,  infection, injury to surrounding organs, and thromboembolic disease were  discussed with the patient.  She does understand that we do believe that  this could be done via Pfannenstiel skin incision.  She will need to have a  separate incision from her prior Pfannenstiel which is too low in the  abdomen for this to be performed.  Risks of injury to the bowel secondary to  endometriosis and adhesive disease were discussed with the patient.  Her  questions were elicited and answered to her satisfaction.  She wishes to  proceed.  We will coordinate a surgical date with Dr. Edward Jolly, as well as Dr.  Kyla Balzarine, as we are contemplating September 18.  The patient understands that  if this is  date that works for those parties, I will not be performing her  surgery, that she will have the opportunity to meet Dr. Kyla Balzarine prior to her  surgery.  She was given our cards and encouraged to call if she has any  further questions.      Paola A. Duard Brady, MD  Electronically Signed     PAG/MEDQ  D:  12/02/2005  T:  12/03/2005  Job:  [id number]   cc:   Randye Lobo, M.D.  Fax: [phone]   Jackquline Denmark. Kyla Balzarine, M.D.  [address]  [place]  [place] [address]   Telford Nab, R.N.  [address]  [place], [place] [address]

## 2010-08-23 NOTE — Op Note (Signed)
[place]  Patient:    [name], [name] Visit Number: [id number] MRN: [id number]          Service Type: DSU Location: [facility] Attending Physician:  Chapman Moss Dictated by:   Teena Irani. Odis Luster, M.D. Proc. Date: 05/10/01 Admit Date:  05/10/2001                             Operative Report  PREOPERATIVE DIAGNOSES: 1. Complicated open wound in the anterior abdominal wall. 2. Possible retained foreign body or foreign bodies.  POSTOPERATIVE DIAGNOSES: 1. Complicated open wound in the anterior abdominal wall. 2. Multiple retained foreign bodies. 3. Complex open wound of the abdomen.  PROCEDURE PERFORMED: 1. Excisional wound debridement of a complicated chronically open abdominal    wound including down to fascia. 2. Removal of three separate foreign bodies with the wound complicated. 3. A complex wound closure of the abdomen greater than 7.5 cm in length.  SURGEON:  Teena Irani. Odis Luster, M.D.  ANESTHESIA:  General.  ESTIMATED BLOOD LOSS:  Minimal.  DRAINS:  One 7 mm Blake drain was left.  CLINICAL NOTE:  A 40 year old woman who has had a myomectomy by her GYN surgeon approximately a year and a half ago.  She has had some chronic wound problems in the abdomen and has had that wound re-explored on one occasion. She said that a little over a month ago, a suture came out of the wound.  This was a Panacryl suture which her Company secretary said should have been gone by the nine month mark.  In any event, this was discussed with him and he did agree with an attempted wound debridement and exploration.  This was discussed with the patient in detail.  She understood that because of her morbid obesity, as well as the fact that she has continued to smoke despite multiple warnings wound healing that she was at higher risk.  She also understood that it is very difficult to locate foreign bodies within tissue like this.  She understood that she may  be left with continued wound healing problems and continued problems with foreign material.  She wished to proceed.  DESCRIPTION OF PROCEDURE:  The patient was taken to the operating room and placed supine.  After successful induction of general anesthesia, she was prepped with Betadine and draped with sterile drapes.  The wound was excised in its entirety.  The dissection was carried down to the subcutaneous tissue down to the anterior fascia.  Dissection was then carried deep to the fascia. Multiple Panacryl sutures were identified.  Three separate sutures were removed including a very large, long suture that appeared to be a woven stitch. There was no frank purulence and no evidence of any abscess.  Small areas of granulation were gently debrided and cauterized.  A thorough irrigation was done with copious amounts of saline.  Hemostasis having been confirmed, a Blake drain 7 mm flap was positioned in the depth of the wound and brought out through a separate stab wound inferiorly and secured with a 3-0 Prolene suture. The wound closure was with 3-0 Prolene interrupted vertical mattress sutures.  This allowed approximation of the wound without leaving any deep material or any type of suture material in the depth of the wound since this had been a chronically inflamed wound over a long period of time.  Xeroform gauze and dry sterile dressings were applied.  She tolerated the procedure  well.  DISPOSITION:  No lifting, no vigorous activities, no shower yet.  Empty the drain three times a day and record.  Will recheck in the office next week.  MEDICATIONS: 1. Percocet 5 mg tablets, a total of 40 given, 1-2 p.o. q6h p.r.n. for pain. 2. Keflex 500 mg p.o. q.i.d. Dictated by:   Teena Irani. Odis Luster, M.D. Attending Physician:  Chapman Moss DD:  05/10/01 TD:  05/10/01 Job: [id number]

## 2010-08-23 NOTE — Op Note (Signed)
NAME:  [name], [name]                        ACCOUNT NO.:  [id number]   MEDICAL RECORD NO.:  [id number]                   PATIENT TYPE:  INP   LOCATION:  0471                                 FACILITY:  [facility]   PHYSICIAN:  Sandria Bales. Ezzard Standing, M.D.               DATE OF BIRTH:  1970-11-26   DATE OF PROCEDURE:  09/25/2003  DATE OF DISCHARGE:  09/27/2003                                 OPERATIVE REPORT   PREOPERATIVE DIAGNOSIS:  Morbid obesity with large hiatal hernia.   POSTOPERATIVE DIAGNOSIS:  Morbid obesity with large hiatal hernia, status  post laparoscopic repair of hiatal hernia, laparoscopically placed  adjustable gastric band.   OPERATION/PROCEDURE:  Esophagogastroscopy.   SURGEON:  Sandria Bales. Ezzard Standing, M.D.   FIRST ASSISTANT:  None.   ANESTHESIA:  General.   INDICATIONS FOR PROCEDURE:  [name] is a 40 year old morbidly obese  female who also has a large hiatal hernia.  Dr. Luan Pulling has successfully  reduced the hiatal hernia and done a repair of the hiatus.  Has also placed  a laparoscopic adjustable gastric band, the lap band, around the proximal  stomach.  I am doing an endoscopy for a couple of reasons; first to document  the GE junction.  Secondly, to make sure that the band has not too  tightened.  It appears to be well placed from an endoscopic standpoint and  third, to make sure there is no residual ulceration or trauma to the stomach  during the procedure.   OPERATION/PROCEDURE:  With the patient in the supine position, Dr.  Luan Pulling had the endoscope in the abdomen.  The lap band had been closed  over the first notch.  I passed an endoscope without difficulty down to the  GE junction.  The Z-line or GE junction was noted to be at approximately 41-  42 cm from the incisors.  There appeared to be a good placement of position  of the band externally to the avoid __________ position the GE junction was  endoscopically and this was documented with Dr. Luan Pulling  pressing on the  outside of the stomach.  Thirdly, there was no obvious ulceration or trauma  to the esophageal gastric lining and doing the reduction.   The scope was then withdrawn.  We again visualized the stomach this side of  the Z-line where it again was approximated between 40 and 42 cm and the  esophagus itself.   The scope was then withdrawn.  Dr. Luan Pulling dictated the remainder of the  portion of the operation.                                               Sandria Bales. Ezzard Standing, M.D.    DHN/MEDQ  D:  09/29/2003  T:  09/29/2003  Job:  [id number]  cc:   Vikki Ports, M.D.  [address], [address]  [place]  [place] [address]  Fax: [phone]

## 2010-08-23 NOTE — Op Note (Signed)
[facility]  Patient:    [name], [name]                   MRN: [id number] Proc. Date: 01/31/00 Adm. Date:  [id number] Attending:  Trevor Iha                           Operative Report  PREOPERATIVE DIAGNOSES:         1. Severe pelvic pain not responsive to                                    conservative management.                                 2. Abnormal uterine bleeding.                                 3. Probable fibroids by ultrasound.  POSTOPERATIVE DIAGNOSES:        1. Severe pelvic pain not responsive to                                    conservative management.                                 2. Abnormal uterine bleeding.                                 3. Probable fibroids by ultrasound.                                 4. Pelvic adhesions and endometriosis.  OPERATION:                      Laparotomy with lysis of adhesions and uterine biopsy.  SURGEON:                        Trevor Iha, M.D.  ASSISTANT:                      Freddy Finner, M.D.  ANESTHESIA:                     General endotracheal.  ESTIMATED BLOOD LOSS:           75 cc.  INDICATIONS:                    [name] is a 40 year old nulligravida white female with worsening pelvic pain with the right side _________, abnormal uterine bleeding not responsive to oral contraceptive agents, anti-inflammatory medications, currently on double dose oral contraceptive agents and anti-inflammatory medications and narcotics for pain.  She has probable fibroids by ultrasound.  She presents today for laparotomy, probably myomectomy for evaluation and treatment of the pain.  Risks and benefits were discussed at length.  Informed consent was obtained.  FINDINGS:  At the time of surgery, the cul-de-sac is frozen with adhesions.  The right fallopian tube and ovary were adhesed to the bowel and to the fundus and back of the uterus.  There  is a normal appearing left tube and ovary.  The appendix is retrocecal and not identified. Right-sided anterior uterine wall nodule which was biopsied approximately 1 cm in size which may represent a degenerated fibroid versus endometriosis.  DESCRIPTION OF PROCEDURE:       After adequate anesthesia, the patient was placed in the supine position.  She was sterilely prepped and draped.  Foley catheter was sterilely placed.  The Pfannenstiel skin incision she was made and taken down sharply to the fascia which was incised transversely, extended superiorly and inferiorly at the rectus muscle which was separated sharply in the midline.  The peritoneum was entered sharply.  The OConnor-OSullivan retractor was placed.  The bowel was packed cephalad and the above findings were noted.  Sharp dissection was carried out across the fallopian tubes, ovary and down the cul-de-sac resecting bowel from the posterior uterine wall down to the level of the uterosacral ligament.  Hemostasis was achieved with Bovie cautery care taken to avoid lumen and also ureter and major blood vessels.  After the cul-de-sac was freed from adhesion sharply and hemostasis achieved, a copious amount of irrigation was applied.  At this time, careful examination of the uterus did not reveal any large fibroids, however, a small area to the right fundus approximately 1 cm in size, firm that had a brownish-blue tint was biopsied.  The uterine wall biopsy was closed with two figure-of-eights of 0 Monocryl with good hemostasis.  At this time after adequate hemostasis was assured, the left and right fallopian tubes were free and normal.  The ovaries appeared normal.  Interceed was placed in the posterior cul-de-sac.  At this time, the packing was removed.  The retractors were then removed. The peritoneum was closed with 0 Monocryl.  Rectus muscles were plicated in the midline.  Irrigation was applied and after adequate hemostasis the  fascia was closed with a single running layer of 0 Panacryl. After adequate hemostasis 0 plain was used to closed to Campers fascia. After adequate hemostasis was achieved, the skin was stapled and Steri-Strips applied.  The patient received Cefotan preoperatively.  The sponge, needle and instrument counts were normal x 3.  The patient was stable on transfer to the recovery room. DD:  01/31/00 TD:  01/31/00 Job: [id number]

## 2010-08-23 NOTE — Op Note (Signed)
[name], [name]              ACCOUNT NO.:  [id number]   MEDICAL RECORD NO.:  [id number]          PATIENT TYPE:  INP   LOCATION:  9320                          FACILITY:  [facility]   PHYSICIAN:  Randye Lobo, M.D.   DATE OF BIRTH:  10-Jun-1970   DATE OF PROCEDURE:  12/26/2003  DATE OF DISCHARGE:                                 OPERATIVE REPORT   PREOPERATIVE DIAGNOSES:  1.  Right adnexal mass.  2.  Pelvic pain.  3.  Menometrorrhagia.  4.  History of endometriosis.   POSTOPERATIVE DIAGNOSES:  1.  Extensive pelvic adhesions.  2.  Stage 4 endometriosis.  3.  Right hydrosalpinx.   PROCEDURE:  Hysteroscopy, dilation and curettage, open laparoscopy with  lysis of adhesions, conversion to a laparotomy with lysis of adhesions and  right salpingectomy.   SURGEON:  Randye Lobo, M.D.   ASSISTANT:  Carrington Clamp, M.D.   ANESTHESIA:  General endotracheal.   IV FLUIDS:  4000 mL Ringer's lactate.   ESTIMATED BLOOD LOSS:  300 mL.   URINE OUTPUT:  300 mL.   COMPLICATIONS:  None.   INDICATIONS FOR PROCEDURE:  The patient is a 40 year old gravida 41 Caucasian  female with a known history of endometriosis by laparotomy in 2001 who  presented to the office reporting vaginal and rectal pain, dysmenorrhea, six  weeks of prolonged vaginal bleeding and a desire for eventual future  childbearing.  The patient has a history of recent laparoscopic bariatric  surgery in June of 2005.  The patient has also had a previous D&C for  abnormal uterine bleeding.  The patient has had a hysterosalpingogram also  performed which has documented an obstruction of the right fallopian tube.   The patient's pain has been significant requiring the use of Darvocet.  A  recent pelvic ultrasound has documented a right adnexal mass measuring 4.7  cm in the largest diameter.  This was suspicious for an endometrioma or a  pedunculated fibroid.   The patient wishes for surgical evaluation and treatment of her  pain and  bleeding and the right adnexal mass, and a plan is made to proceed with an  open laparoscopy and possible laparotomy if necessary.  The patient has  agreed to proceed including potential removal of a tube or ovary if  necessary.  Risks, benefits, and alternatives have been discussed with the  patient.   FINDINGS:  Hysteroscopy demonstrated a normal uterine cavity.  There was no  evidence of any fibroids nor endometrial polyps. A small amount of  endometrial curettings were obtained on the curettage.   Laparoscopy demonstrated complete obliteration of the cul-de-sac due to  extensive adhesions between the posterior uterus and the rectum along with  the right and left fallopian tube.  These adhesions were noted to be dense  in nature.  There was a right hydrosalpinx appreciated.  There was a 1 cm  cyst of endometriosis between the right ovary and the right fallopian tube  and uterine fundus.  Each of the ovaries were adherent to the pelvic  sidewall. The left fallopian tube was noted to be  tethered to the rectum and  the posterior lower uterine segment.  The left fallopian tube otherwise had  a fairly normal appearance to it.  Both of the ovaries were not enlarged.  The uterus did not demonstrate any obvious subserosal fibroids.   In the upper abdomen, there was evidence of a white Silastic appearing  appliance which was consistent with the patient's previous surgery.   SPECIMENS:  The endometrial curettings and the right fallopian tube were  sent to pathology.   DESCRIPTION OF PROCEDURE:  The patient was reidentified in the preoperative  hold area.  She did receive Ancef 1 g intravenously for antibiotic  prophylaxis. The patient did receive TED hose and PAS stockings for DVT  prophylaxis as well.  The patient was brought to the operating room where  general endotracheal anesthesia was induced. The abdomen and vagina were  then sterilely prepped and draped.  A Foley catheter  was placed inside the  bladder.  The patient was examined under anesthesia.  The uterus was noted  to be small and anteverted. No adnexal masses could be appreciated.   A speculum was placed inside the vagina and a single tooth tenaculum was  placed on the anterior cervical lip.  The uterus was sounded. The cervix was  then dilated to a #21 Pratt dilator.  The hysteroscope was then inserted  through the cervix to the level of the uterine fundus under the continuous  infusion of Sorbitol.  The findings are as noted above.  The hysteroscopy  portion was complete, the sorbitol deficit was noted to be 140 mL.  The  uterine cavity was then curetted in all four quadrants and a small amount of  curettings were sent to pathology.   A Hulka tenaculum was then used to replace the single tooth tenaculum.   Attention was then turned to the abdomen where open laparoscopy was  performed.  An transverse infraumbilical incision was created sharply with  the scalpel and was carried down through the subcutaneous tissue using a  combination of sharp and blunt dissection to reach the fascia which was  grasped with Kocher clamps and was then incised in a transverse fashion.  The abdominal wall in this region was noted to be very deep.  The  parietoperitoneum was ultimately identified and grasped with two hemostat  clamps and then entered sharply. Digital exam confirmed the absence of any  adhesive disease around the infraumbilical incision.  The #0 Vicryl sutures  were used to grasp the fascia so that the Hasson cannula could be secured in  place. The pneumoperitoneum was achieved with CO2 gas and the laparoscope  was placed and the patient was then placed in the Trendelenburg position.  The 5 mm trocar was then placed in each the right and the left lower  quadrants under visualization of the laparoscope.  In looking at the left anterior abdominal wall, it was thought that it was possible the patient had   possible hernia formation due to the involuted nature of the peritoneum in  the region lateral to the left round ligament. Inspection of the pelvic and  abdominal organs was performed and the findings are as noted above.  Sharp  dissection with a laparoscopic scissors was used to begin addressing the  adhesions noted in the pelvis.  It became clear that these were extremely  dense in nature and that to complete the procedure, a laparotomy would be  necessary.  The pelvis was irrigated and suctioned and hemostasis  was noted  to be good and at this point I briefly left the operating room with the  patient stable in order to have a discussion with the family regarding the  findings.  The family chose to proceed with laparotomy as the patient had  also indicated interest in order to complete the surgery to treat her pain.  I also indicated removal for the right fallopian tube and they agreed to  proceed.   Back in the operating room, the patient then had removal of the laparoscopic  instruments and a Pfannenstiel incision was created sharply with the scalpel  along the line of the patient's previous Pfannenstiel incision.  This was  carried down to the fascia using sharp dissection and monopolar cautery.  The fascia was then incised in the midline and the incision was carried out  bilaterally using the Mayo scissors. The rectus muscles were dissected off  of the overlying fascia superiorly and inferiorly.  A previous opening in  the peritoneum was identified and the peritoneal incision was extended  cranially and caudally.   A self retaining retractor was then placed in the pelvis and the bowel was  packed into the upper abdomen using moistened lap pads.  A combination of  sharp and blunt dissection were then used to normalize the anatomy in the  pelvis.  The ovaries were mobilized free from the pelvic sidewall.  The left  fallopian tube regained 100% mobility.  There was a small amount of  bleeding  on the serosa of the ampullary portion of the left fallopian tube which  responded to interrupted sutures of 4-0 Vicryl and limited use of monopolar  cautery in one area.  The right fallopian tube was sharply dissected from  the right ovary, the uterus and the rectum and was mobilized so that Kelly  clamps could be placed across the mesosalpinx and the isthmic portion of the  fallopian tube. The specimen was then sharply excised and transfixing  sutures of #0 Vicryl were used to create hemostasis.   The posterior lower uterine segment and cervix were somewhat mobilized from  the rectum although this could not be completed in entirety due to the  extensive nature of these adhesions.  A decision was made at this time to  irrigate and evaluate for hemostasis which was excellent.  Intercede was  placed around the left fallopian tube and ovary and the right ovary in  conclusion of the surgery.  The abdomen was closed at this time.  The fascial incision underneath the  umbilicus was noted and was closed with a running suture of #0 Vicryl in a  transverse fashion.  The peritoneum of the Pfannenstiel incision was then  closed with a running suture of 3-0 Vicryl.  The fascia of the Pfannenstiel  incision was closed with a running suture of #0 Vicryl.  The subcutaneous  tissue was irrigated and suctioned and made hemostatic with monopolar  cautery.  The skin was closed with staples in this region.  The skin of the  infraumbilical incision was also closed with staples.  The right and left  lower quadrant 5 mm laparoscopic incisions were closed with Dermabond.   Sterile bandages were placed over the incision.  The Hulka tenaculum was  removed from the cervix.  The patient was taken out of the dorsal lithotomy  position, awakened, and extubated. She went to the recovery room in stable  and awake condition. There were no complications to the procedure. All  needle,  instrument and lap counts  were correct.       ___________________________________________  Randye Lobo, M.D.    BES/MEDQ  D:  12/26/2003  T:  12/27/2003  Job:  [id number]

## 2010-08-23 NOTE — Op Note (Signed)
[name], [name]              ACCOUNT NO.:  [id number]   MEDICAL RECORD NO.:  [id number]          PATIENT TYPE:  INP   LOCATION:  1607                         FACILITY:  [facility]   PHYSICIAN:  John T. Kyla Balzarine, M.D.    DATE OF BIRTH:  05/08/1970   DATE OF PROCEDURE:  DATE OF DISCHARGE:                                 OPERATIVE REPORT   SURGEONS:  1. Ronita Hipps, M.D.  2. Conley Simmonds, M.D.   PREOPERATIVE DIAGNOSIS:  Pelvic pain and stage IV endometriosis with severe  pelvic adhesions.   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   PROCEDURE:  Exploratory laparotomy with total abdominal  hysterectomy/bilateral salpingo-oophorectomy, bilateral ureterolysis and  extensive intra-abdominal adhesiolysis.   ANESTHESIA:  General endotracheal.   DESCRIPTION OF FINDINGS AND INDICATIONS FOR SURGERY:  This 40 year old  nulligravida had prior documented endometriosis with increasing  dysmenorrhea, constant pelvic pain and dyspareunia, and rectal pain with  tenesmus.  She had undergone laparoscopy a couple of years ago revealing  obliteration of the posterior cul-de-sac with dense adhesions.  She elected  definitive treatment.   She was also post gastric banding procedure and we obtained an  intraoperative consultation with Dr. Baruch Merl, without found dense  adhesions from the stomach and liver, and elected to perform no surgery at  this time because of her low Pfannenstiel's incision.   In the pelvis, the patient had small bilateral endometriomas and dense  adhesions involving the distal sigmoid, with scarring of the anterior  sigmoid wall.  There were small peritoneal implants of endometriosis.  There  was reactive retroperitoneal fibrosis bilaterally.  The patient is status  post right salpingectomy.   To facilitate dissection, a reverse hysterectomy was performed, allowing  development of  the rectovaginal septum transvaginally after the ureters had  been mobilized well away from the uterine artery pedicles.  This allowed  resection of the endometriosis of a small portion of rectum, without  significant serotomy or enterotomies.   DESCRIPTION OF PROCEDURE:  The patient was prepped and draped in the low  lithotomy position with an indwelling Foley catheter and was explored  through a transverse Pfannenstiel incision along her old scar, which was  extended slightly.  The incision was carried to this fascia with a scalpel  and electrocautery was used for hemostasis.  The fascia was opened  transversely approximately 2-3 cm above the pubis.  The anterior rectus  fascia was stripped away from the rectus muscles above and below, midline  developed and peritoneum entered atraumatically.  Abdominal contents were  examined and the inflation catheter for the gastric band was noted to be  free and unobstructed.  At this juncture, Dr. Baruch Merl was consulted  because the patient had complained specifically of problems with function.  Manual and visual exploration of the area around stomach revealed dense  adhesions and Dr. Colin Benton did not feel that there was need for operative  intervention at this state.   Bowel was packed out of the pelvis with the above-noted findings.  The  uterine cornua bilaterally were grasped with long Kelly clamps.  The round  ligaments bilaterally were divided with electrocautery.  Anterior and  posterior leaves of the broad ligament were opened and the right and left  pararectal spaces were developed, identifying each ureter.  The ureters were  densely adherent to the medial leaf of the broad ligament, particularly in  their distal pelvic course, where they were intimately applied to the  endometriomas.  Each ureter was mobilized through its course in the pelvis  to insertion into the ureteral tunnel and during performance of the  hysterectomy, were  visualized on multiple occasions.  The infundibulopelvic  ligaments bilaterally were skeletonized, isolated, crossclamped and divided.  Each pedicle was controlled with a free tie and suture ligature.  All  sutures and ligatures were 2-0 Vicryl unless otherwise specified.  The  bladder flap was developed with sharp and blunt dissection, mobilizing the  bladder far down the cervix and off of the upper vagina.  The uterine  vessels bilaterally were skeletonized retroperitoneally, crossclamped,  divided and suture-ligated.  Because of the densely adherent endometriosis  in the posterior cul-de-sac, the anterior fornix of the vagina was elevated  between 2 Kocher clamps, and entered with electrocautery.  Bilateral  cardinal ligament clamps were developed by applying clamps that included the  vaginal cuff.  Each ureter was noted to be widely free of the clamps.  These  were sutured with transfixing sutures of 2-0 Vicryl.  The posterior vagina  was incised and rectovaginal septum developed with sharp and blunt  dissection, using electrocautery for hemostasis.  Uterosacral ligament was  developed, clamped, divided and suture-ligated with 2-0 Vicryl.  The vaginal  cuff was closed with a locked running suture line of 0 Vicryl.  The uterus  and endometriomas were sharply dissected off of the rectosigmoid mesentery  and serosa.  No significant serotomies or enterotomies occurred during this  dissection, but there was considerable raw surface, particularly in the  mesentery.  The pathology specimen was delivered for permanent pathology.  Hemostasis was achieved where necessary with electrocautery.  All sponges  and retractors were removed and the abdominal wall was closed in layers  using a running suture line and 0 Vicryl to close the fascia, 3-0 Vicryl  interrupted sutures to close subcutaneous adipose tissue and clips to close  the skin.  The patient tolerated procedure well and was returned to  the recovery room in stable condition.   ESTIMATED BLOOD LOSS:  250 mL.   TRANSFUSIONS:  None.   DRAINS, PACKS, ETC:  Foley to dependent drainage.   SPONGE AND INSTRUMENT COUNTS:  Correct x2.   PATHOLOGY:  Uterus, tubes and ovaries to permanent pathology.      John T. Kyla Balzarine, M.D.  Electronically Signed     JTS/MEDQ  D:  12/23/2005  T:  12/24/2005  Job:  [id number]   cc:   Randye Lobo, M.D.  Fax: [phone]   Alfonse Ras, MD  [address]  [place]  [place] [address]   Telford Nab, R.N.  [phone] [address]  [place], [place] [address]

## 2010-08-23 NOTE — Op Note (Signed)
NAME:  [name], [name]                        ACCOUNT NO.:  [id number]   MEDICAL RECORD NO.:  [id number]                   PATIENT TYPE:  OBV   LOCATION:  0471                                 FACILITY:  [facility]   PHYSICIAN:  Vikki Ports, M.D.         DATE OF BIRTH:  [birth year]   DATE OF PROCEDURE:  09/25/2003  DATE OF DISCHARGE:                                 OPERATIVE REPORT   PREOPERATIVE DIAGNOSES:  1. Large hiatal hernia.  2. Gastroesophageal reflux disease.  3. Morbid obesity.   POSTOPERATIVE DIAGNOSES:  1. Large hiatal hernia.  2. Gastroesophageal reflux disease.  3. Morbid obesity.   PROCEDURE:  Laparoscopic hiatal hernia repair and laparoscopic adjustable  gastric banding.   SURGEON:  Vikki Ports, M.D.   ASSISTANT:  Sandria Bales. Ezzard Standing, M.D.   ANESTHESIA:  General.   DESCRIPTION OF PROCEDURE:  The patient was taken to the operating room,  placed in a supine position and after adequate general anesthesia was  induced using the endotracheal tube, the abdomen was prepped and draped in  the normal sterile fashion.  Using an 11 mm incision in the left upper  quadrant, an Optiview trocar was placed.  Under direct visualization, two  additional 11 mm trocars were placed in the abdomen and a 12 mm trocar was  placed just right of the midline.  A 5 mm port was placed in the left  lateral position.  After pneumoperitoneum was obtained and trocars were  placed, a small incision was made in the subxiphoid region and [facility]  liver retractor was placed. The left lateral segmental liver was retracted.  A very large hiatal hernia defect was evident with a significant portion of  her stomach in the chest.  This was easily reduced and the sac was excised  until the entire stomach was in the abdomen.  The defect was quite large and  it was closed with four separate #1 Surgidek sutures approximating the left  and right crus.  This created a good window posterior to  the esophagus and  at the angle of His. A laparoscopic right angle was placed.  A dilator tube  was placed down through the hiatus and into the stomach.  A #10 lap band was  then introduced by the lap band introducer into the abdomen. It was brought  posterior to the stomach and latched. The dilator was left in place and it  was firmly latched.  It moved easily.  The dilator was removed. At this  point, Dr. Ezzard Standing performed an EGD and the Z line and the EG junction was  verified to be in the abdomen at approximately 42 cm.  The endoscope was  removed and the anterior stomach was brought together over the band with  interrupted Vicryl sutures 1 cm apart. This was clearly away from the buckle  of the band.  The Nathanson retractor was removed, adequate hemostasis was  assured, pneumoperitoneum was  released. The tubing was brought out through  the 12 mm trocar site in the right mid abdomen.  This incision was extended  laterally.  The anterior abdominal fascia was identified and sutures were  placed after the port had been connected. The port was then placed down  within the pocket and attached to the anterior rectus fascia without  difficulty. The port lay nicely  and was somewhat inferior to the incision by approximately 1 cm.  That site  was closed with subcutaneous 3-0 Vicryl, skin incisions were closed with  subcuticular 4-0 Monocryl, Steri-Strips and sterile dressings were applied.  The patient tolerated the procedure well and went to PACU in good condition.                                               Vikki Ports, M.D.    KRH/MEDQ  D:  09/25/2003  T:  09/25/2003  Job:  [id number]

## 2010-08-23 NOTE — H&P (Signed)
NAMEDAPHNA, LAFUENTE              ACCOUNT NO.:  [id number]   MEDICAL RECORD NO.:  [id number]          PATIENT TYPE:  INP   LOCATION:  0101                         FACILITY:  [facility]   PHYSICIAN:  Sandria Bales. Ezzard Standing, M.D.  DATE OF BIRTH:  20-Jul-1970   DATE OF ADMISSION:  01/26/2006  DATE OF DISCHARGE:                                HISTORY & PHYSICAL   REASON FOR ADMISSION:  Nausea and vomiting.   HISTORY OF ILLNESS:  Ms. Heffler is a 40 year old white female who is a  patient of Dr. Vernon Prey who underwent a laparoscopic repair of hiatal  hernia and lap adjustable gastric band in June 2005.  Postoperatively, she  developed another hiatal hernia, required a revision.  The first surgery was  done by Dr. Baruch Merl.  Her second operation done by Dr. Wenda Low as  a revision was April 23, 2004.  She had a type 3 hiatal hernia that was  repaired with a micromesh Gore-Tex patch anteriorly.   She then recurred again and underwent a second revision of the hernia on  November 05, 2004.  At that time she underwent a laparoscopic takedown of  recurrent hiatal hernia from previous Gore-Tex mesh and she had a repair  with gastropexy.  Did not put any new mesh but tacked some of the sutures to  her old repair.   She did well for a while.  However, over the last several months has had  increasingly prolonged nausea and vomiting which happens just about daily.  It has gotten so bad that I even tried to endoscope her last week.   At endoscopy on October 15 she had:  1. Distal esophagitis with small ulcers.  2. Retained fluid in gastric pouch consistent with poor emptying pouch and      probably a pouch too large suggesting slippage.  3. Bile reflux into her distal gastric pouch but no evidence of a hiatal      hernia.   She had recently undergone an open total abdominal hysterectomy with  bilateral salpingo-oophorectomy, bilateral ureterolysis, and extensive  intraabdominal adhesiolysis by Dr.  Conley Simmonds and Dr. Ronita Hipps on  December 23, 2005.  At that time Dr. Colin Benton examined the patient  intraoperatively but really could not demonstrate very well how her band was  positioned.   So she has represented with persistent nausea and vomiting.  She was  recently put on some Flagyl which probably exacerbated this for a  vaginitis by Dr. Edward Jolly.  I am not sure how well she will be able to take  antibiotic pills without causing some type of local gastritis.   PAST MEDICAL HISTORY:  She has no allergies.   CURRENT MEDICATIONS:  Include Zegerid she is taking twice a day and the  Flagyl.   PAST HISTORY:  Other operations include she has had apparently a  laparoscopic cholecystectomy.  She has had a right-sided endometrioma  removed via Pfannenstiel incision in September 2005.  She denies any  significant pulmonary, cardiac, or urologic problem.   She does continue to smoke about a pack of cigarettes  a day.  She has noted  this has been bad for her reflux, for her band and for her weight loss.   PHYSICAL EXAMINATION:  VITAL SIGNS:  Her temperature is 97.4, blood pressure  124/91, pulse 90, respirations 20.  GENERAL:  She is a thin white female who really does not look in extremis.  She does not look to be dehydrated, though she is mildly emaciated.  NECK:  Supple.  I feel no mass, no thyromegaly.  LUNGS:  Clear to auscultation with symmetric breath sounds.  HEART:  Has a regular rate and rhythm, again with a pulse in about the 88 to  90 range.  ABDOMEN:  Has some redundant skin but she has no organomegaly, hernia, mass.  RECTAL:  I did not do a rectal exam on her.  EXTREMITIES:  Had good strength in all four extremities.  NEUROLOGIC:  Grossly intact.   Labs I have show a hemoglobin of 14.6, hematocrit of 43.3, white blood count  of 6200.  Sodium 140, potassium 3.6, chloride of 98, CO2 of 33, glucose of  93, BUN of 7, creatinine is 0.8.  Her albumin is 3.8 and her liver  functions  are all normal.   IMPRESSION:  Dehydration secondary to malfunction or maladjusted band.  The  thing has been decompressed.  I have spoken to [name] and really, the  next step is removal of the band and then you put a new band in place or do  you do a bypass, and this all has yet to be resolved.   PLAN:  1. Hydrate her overnight, try to put a nasal feeding tube beyond the band      tomorrow in radiology.  If the feeding tube cannot be done in      radiology, it maybe could be done in endoscopy.  The indications and      potential complications have all been explained to the patient.  We then arranged home health care to go by at home to help in feeding, give  her fluid, etc.  1. Questionable vaginitis, on Flagyl.  I will give her IV antibiotics      while here but we will have to figure out what to do when she goes      home.  2. Recent hysterectomy.  She appears to have done well.  3. Nutrition with albumin in normal range right now.  Will check a      prealbumin level on her.  4. Smokes cigarettes, knows this is bad for her health.      Sandria Bales. Ezzard Standing, M.D.  Electronically Signed     DHN/MEDQ  D:  01/26/2006  T:  01/26/2006  Job:  [id number]   cc:   Randye Lobo, M.D.  Fax: [phone]   Ernestina Penna, M.D.  Fax: [phone]   Thornton Park Daphine Deutscher, MD  [address], [address]  [place]  [place] [address]

## 2010-08-23 NOTE — Op Note (Signed)
NAME:  [name], [name]                        ACCOUNT NO.:  [id number]   MEDICAL RECORD NO.:  [id number]                   PATIENT TYPE:  AMB   LOCATION:  ENDO                                 FACILITY:  [facility]   PHYSICIAN:  Sandria Bales. Ezzard Standing, M.D.               DATE OF BIRTH:  08-02-1970   DATE OF PROCEDURE:  09/18/2003  DATE OF DISCHARGE:                                 OPERATIVE REPORT   PREOPERATIVE DIAGNOSIS:  Large hiatal hernia, anticipating a lap band for  weight loss.   POSTOPERATIVE DIAGNOSIS:  Large hiatal hernia with normal esophagus.  No  ulceration of stomach.   OPERATION PERFORMED:  Esophagogastroscopy.   SURGEON:  Sandria Bales. Ezzard Standing, M.D.   ANESTHESIA:  50 mg of Demerol, 5 mg of Versed.   COMPLICATIONS:  None.   INDICATIONS FOR PROCEDURE:  [name] is a 40 year old white female who  weighs about 317 pounds, has a BMI of 44, who is anticipating bariatric  surgery but she has a very large hiatal hernia.  She comes for preop  endoscopy for evaluation of any ulcer or any other abnormality.   DESCRIPTION OF PROCEDURE:  Patient given initially 4 mg of Versed and 50 mg  of Demerol.  The back of her throat was anesthetized with Cetacaine and an  additional 1 mg of Versed was given during the procedure.  She was monitored  with pulse oximetry, had an EKG and blood pressure cuff and was given nasal  oxygen during the procedure.  The patient was placed in the left lateral  decubitus position after the anesthetic was given. A flexible Olympus  endoscope was passed without difficulty down the throat; however, upon  getting through her distal esophagus, which was probably about 35 cm, she  had a large portion of her stomach herniated above the diaphragm.  It  actually had made it very difficult to cannulate her distal stomach because  of the angulation at the diaphragm.  It is hard to give a percentage of what  part of her stomach was above her diaphragm but certainly at least  60%  appeared to be and even though I could cannulate into the distal esophagus,  I was never able to successfully advance the scope into the duodenum.  I  could get to the antrum, however.  I saw no gastric ulcers or erosion.  Her  distal esophagus had no ulceration or erosions.  Her diaphragm is  approximately at 40 cm.  Again, a large portion of her stomach above  the diaphragm.  Her distal esophagus is approximately 35 cm.  The patient  tolerated the procedure well.  I spoke with her and her family at the end of  the procedure.  Again, in one week, I think June 20 she is scheduled for a  lap band procedure with reduction of her hernia.  Sandria Bales. Ezzard Standing, M.D.    DHN/MEDQ  D:  09/18/2003  T:  09/18/2003  Job:  [id number]   cc:   Vikki Ports, M.D.  [address]  [place]  [place] [address]  Fax: [phone]

## 2010-08-23 NOTE — Discharge Summary (Signed)
NAMETHURSA, EMME              ACCOUNT NO.:  [id number]   MEDICAL RECORD NO.:  [id number]          PATIENT TYPE:  INP   LOCATION:  1603                         FACILITY:  [facility]   PHYSICIAN:  Thornton Park. Daphine Deutscher, MD  DATE OF BIRTH:  30-Oct-1970   DATE OF ADMISSION:  02/05/2006  DATE OF DISCHARGE:  02/09/2006                                 DISCHARGE SUMMARY   ADMISSION DIAGNOSIS:  Nausea and vomiting with prior placement of gastric  band.   DISCHARGE DIAGNOSIS:  Obstruction from lap band.   PROCEDURE:  February 06, 2006, laparoscopic removal of lap band and port.   HOSPITAL COURSE:  Kelechi Astarita is a 40 year old who underwent laparoscopic  removal of her lap band on Friday February 06, 2006.  Postoperatively, she  had some nausea.  She eventually got along well.  She was taking oral  liquids and pain meds without problems on postop day #3 and she was ready  for discharge at that time.  Instructions were for her to come back and see  [place] in the office in about 2-3 weeks.      Thornton Park Daphine Deutscher, MD  Electronically Signed     MBM/MEDQ  D:  02/09/2006  T:  02/09/2006  Job:  [id number]

## 2010-08-23 NOTE — Op Note (Signed)
NAME:  [name], [name]                        ACCOUNT NO.:  [id number]   MEDICAL RECORD NO.:  [id number]                   PATIENT TYPE:  AMB   LOCATION:  DAY                                  FACILITY:  [facility]   PHYSICIAN:  Malachi Pro. Ambrose Mantle, M.D.              DATE OF BIRTH:  04-14-1970   DATE OF PROCEDURE:  06/23/2003  DATE OF DISCHARGE:                                 OPERATIVE REPORT   PREOPERATIVE DIAGNOSIS:  Abnormal uterine bleeding.  Pelvic endometriosis.   POSTOPERATIVE DIAGNOSIS:  Abnormal uterine bleeding.  Pelvic endometriosis.   OPERATION:  Fractional dilatation and curettage.  Hysteroscopy.   SURGEON:  Malachi Pro. Ambrose Mantle, M.D.   ANESTHESIA:  General.   The patient was brought to the operating room and placed under satisfactory  general anesthesia.  She was placed in the dorsal lithotomy position.  Exam  revealed the uterus to be hard to feel.  The cul de sac was nodular from  endometriosis.  The vulva, vagina, perineum, and urethra were prepped with  Betadine solution.  The area was draped in the sterile fashion with an under-  buttock sheet to collect the sorbitol.  The cervix was grasped with a  tenaculum and drawn into the operative field.  The uterus sounded a little  over 3 inches slightly anteriorly.  The uterine canal was then dilated up to  a 27 Pratt dilator.  A fractional D&C was done, producing minimal tissue  from both the endocervical canal and the endometrial cavity.  I used polyp  forceps to try to remove some old blood clots and just a little bit of  tissue.  At the fundus of the uterus, it felt to be a little bit irregular,  but after I had done the D&C, I did insert the hysteroscope.  I traveled up  the endocervical canal into the lower uterine segment and all the way to the  fundus of the uterus, and I did not see any evidence of a septate uterus, a  fibroid, or a polyp at the fundus.  I made a couple of biopsies with the  biopsy forceps, removed the  hysteroscope, and then another D&C.  The walls  of the cavity felt gritty.  I no longer could feel the irregularity of the  fundus of the uterus.  I did not see either tubal ostia, but I think this  was because there was blood in the endometrial cavity, and the tissue was  somewhat shaggy.  The tenaculum was removed.  There was no bleeding.  The  procedure was terminated.  Deficit of sorbitol was 20 cc.  The patient was  returned to recovery in satisfactory condition.  Malachi Pro. Ambrose Mantle, M.D.    TFH/MEDQ  D:  06/23/2003  T:  06/23/2003  Job:  [id number]

## 2010-08-23 NOTE — Op Note (Signed)
[name], [name]              ACCOUNT NO.:  [id number]   MEDICAL RECORD NO.:  [id number]          PATIENT TYPE:  INP   LOCATION:  1608                         FACILITY:  [facility]   PHYSICIAN:  Thornton Park. Daphine Deutscher, MD  DATE OF BIRTH:  07/21/1970   DATE OF PROCEDURE:  11/05/2004  DATE OF DISCHARGE:                                 OPERATIVE REPORT   PREOPERATIVE DIAGNOSIS:  Recurrent hiatal hernia with esophagitis reflux.   POSTOPERATIVE DIAGNOSIS:  Recurrent hiatal hernia with esophagitis reflux.   PROCEDURE:  Laparoscopic takedown of recurrent hiatal hernia from previous  Gore-Tex mesh repair of diaphragm, repair of hiatal hernia and the  gastropexy.   OPERATIVE TIME:  Six hours.   SURGEON:  [name].   ASSISTANT:  Dr. Luan Pulling.   ANESTHESIA:  General endotracheal.   DESCRIPTION OF PROCEDURE:  Eilee Schader was brought OR one on November 05, 2004 and given general anesthesia.  The abdomen was prepped widely with  Betadine and draped sterilely.  Access to the abdomen was gained through the  left upper quadrant with the OptiVu, and the was insufflated and then a  total of 6 trocar sites were used.  The with 5 mm in the upper midline, the  Nathanson retractor retracted the liver.  Initial dissection began with the  Harmonic scalpel taking down stomach from the liver exposing the band.  The  band seemed to have slipped up into the chest up to the level of the [facility] mesh that was used to repair the hiatal hernia before.  We spent the  next 5 hours tediously dissecting the stomach and then eventually the  esophagus away from the Gore-Tex patch and then getting up the chest and  freeing this up.  We also had Dr. Ezzard Standing endoscope and Dr. Luan Pulling  endoscope the patient to help reveal the anatomy of the esophagus.  At no  time during insufflation was there any evidence of a leak with bubbling or  loss of air.  This meticulous dissection was performed with a harmonic  scalpel, and bleeding was controlled.  A Penrose drain was passed from  behind the stomach, esophagus, and we used that to track down to where the  entire stomach was below the diaphragm.   The good part about the Gore-Tex in the anterior position was that it was  very well fixed and fused to the crura.  We then approximated this with  three sutures anteriorly through the Gore-Tex and then with figure-of-eight  suture posteriorly along with another simple suture.  This effectively  closed this defect.  The stomach was then packed anteriorly to try to help  keep it again from wanting to slide up into the chest.  In retrospect, it  appears that the  micromesh Gore-Tex had flapped underneath and allowed the stomach to go up  inside when it reherniated.  The patient seemed to tolerate the procedure  well, had the wounds then closed with 4-0 Vicryls with Benzoin and Steri-  Strips.  She was then taken to recovery room in satisfactory condition.  MBM/MEDQ  D:  11/05/2004  T:  11/05/2004  Job:  [id number]

## 2010-08-23 NOTE — Op Note (Signed)
NAME:  [name], [name]                        ACCOUNT NO.:  [id number]   MEDICAL RECORD NO.:  [id number]                   PATIENT TYPE:  OBV   LOCATION:  0378                                 FACILITY:  [facility]   PHYSICIAN:  Lorre Munroe., M.D.            DATE OF BIRTH:  1970-10-12   DATE OF PROCEDURE:  10/19/2002  DATE OF DISCHARGE:                                 OPERATIVE REPORT   PREOPERATIVE DIAGNOSIS:  Symptomatic gallstones.   POSTOPERATIVE DIAGNOSIS:  Symptomatic gallstones.   OPERATION:  Laparoscopic cholecystectomy.   SURGEON:  Zigmund Daniel, M.D.   ASSISTANT:  Angelia Mould. Derrell Lolling, M.D.   ANESTHESIA:  General.   DESCRIPTION OF PROCEDURE:  After the patient was monitored and anesthetized  and had routine preparation and draping of the abdomen, I made a short  infraumbilical transverse incision through an anesthetized site.  I incision  the midline fascia for about 1.5 cm and then bluntly opened the peritoneum,  dilated it with my finger.  I put in a 0 Prolene pursestring suture in the  fascia and secured a Hasson cannula with it, then inflated the abdomen with  CO2 and inspected the contents.  No abnormalities were noted except for  adhesions of the omentum to the undersurface of the gallbladder.  After  anesthetizing three additional sites and placing three additional ports  under direct vision to be sure there was no injury of the bowel, I  positioned the patient head-up, foot-down, and tilted to the left and  visualized the gallbladder.  I grasped the fundus of the gallbladder and  retracted it toward the right shoulder and took down the adhesions until I  could see the infundibulum of the gallbladder narrowing into the cystic  duct.  I made a window medial to the cystic duct and cystic artery, clearly  identified the cystic duct's emergence from the infundibulum.  I then  clipped the cystic duct with four clips and cut between the two which were  closest  to the gallbladder.  I similarly clipped and divided the cystic  artery and one additional small artery going to the gallbladder.  I then  dissected the gallbladder from the liver using the catheter for hemostasis  and for the dissection.  Hemostasis was good.  I inadvertently made one  small hole in the gallbladder, and a small amount of bile spilled out.  After getting good hemostasis and detaching the gallbladder from the liver,  I placed the gallbladder into a plastic pouch and removed it through the  umbilical incision and then tied the pursestring suture.  I then copiously  irrigated the right upper quadrant until the irrigant was clear, and I  removed all the irrigant that I could.  Sponge,  needle, and instrument counts were correct.  I removed the lateral ports  under direct vision, then allowed the CO2 to escape from the epigastric port  and removed  it as well.  I closed all of the skin incisions with Steri-  Strips.  The patient tolerated the operation well.                                               Lorre Munroe., M.D.    WB/MEDQ  D:  10/19/2002  T:  10/19/2002  Job:  [id number]   cc:   Ernestina Penna, M.D.  [address] [place]  [place] [address]  Fax: [phone]

## 2010-08-23 NOTE — Discharge Summary (Signed)
[name], [name]              ACCOUNT NO.:  [id number]   MEDICAL RECORD NO.:  [id number]          PATIENT TYPE:  INP   LOCATION:  9320                          FACILITY:  [facility]   PHYSICIAN:  Randye Lobo, M.D.   DATE OF BIRTH:  [birth year]   DATE OF ADMISSION:  12/26/2003  DATE OF DISCHARGE:  12/28/2003                                 DISCHARGE SUMMARY   ADMISSION DIAGNOSES:  1.  Right adnexal mass.  2.  Pelvic pain.  3.  Menometrorrhagia.  4.  History of endometriosis.   DISCHARGE DIAGNOSES:  1.  Stage 4 endometriosis.  2.  Right hydrosalpinx   SIGNIFICANT OPERATIONS AND PROCEDURES:  The patient underwent a hysteroscopy  with dilation and curettage, open laparoscopy with lysis of adhesions, and  conversion to laparotomy with lysis of adhesions and right salpingectomy  under the direction of Dr. Conley Simmonds and with the assistance of Dr.  Carrington Clamp at the [facility] on December 26, 2003.   ADMISSION HISTORY AND PHYSICAL EXAMINATION:  The patient is a 40 year old  gravida 0 Caucasian female status post laparotomy with lysis of adhesions  and uterine biopsy for endometriosis in October 2001 who presented with  right lower quadrant pain and vaginal and rectal pain since July 2005.  The  patient had also reported abnormal bleeding per vagina for the last 6 weeks.  The patient had previously had a D&C earlier this spring.  The patient  underwent an ultrasound documenting a right adnexal mass measuring 4.7 cm  which was suspicious for an endometrioma versus a pedunculated fibroid with  degeneration.  The uterus itself appeared to be normal with an endometrial  stripe of 5-6 mm and a normal left ovary.  The patient was requiring  Darvocet to treat her pain.  She was a smoker approaching age 58 and not a  candidate for oral contraceptive therapy.  The patient did have negative  gonorrhea and chlamydia cultures on December 07, 2003.  Of note, the  patient  had recently undergone laparoscopic bariatric surgery in June 2005.  The  patient has previously had a hysterosalpingogram documenting obstruction of  the right fallopian tube.   The patient desires surgical evaluation and treatment of her pain and agrees  to proceed with laparoscopy and laparotomy if necessary to perform both a  diagnostic and therapeutic surgery.  She has agreed to proceeding with  potential removal of a tube or ovary if necessary.  Risks, benefits, and  alternatives were discussed with the patient, who wished to proceed.   HOSPITAL COURSE:  The patient was admitted on December 26, 2003 for a  hysteroscopy, dilation and curettage, open laparoscopy with lysis of  adhesions, and ultimately conversion to a laparotomy with lysis of adhesions  and a right salpingo-oophorectomy for diagnosis of stage 4 endometriosis  with a right hydrosalpinx.  The patient's surgery was uncomplicated.   Postoperatively, the patient did well.  By postoperative day #1, the vaginal  and rectal pressure and pain that she had had preoperatively was gone.  She  was experiencing incisional discomfort for  which she was initially treated  with a morphine PCA.  By postoperative day #1, she was converted over to  Percocet and ibuprofen which controlled her pain adequately.  The patient on  postoperative day #1 began to ambulate independently.  Her Foley catheter  was removed and she voided spontaneously.   Her incision remained intact without any significant erythema or drainage  during her hospital course.  The patient did remain afebrile as well.   The patient's discharge hemoglobin was noted to be 13.6.   She was found to be in good condition and ready for discharge on  postoperative day #2.   INSTRUCTIONS AT DISCHARGE:  1.  Discharge to home.  2.  The patient will have decreased activity for the next 6 weeks.  3.  The patient will follow a regular diet.  4.  The patient will  follow up in the office on December 29, 2003 for      staple removal.  5.  The patient will call if she experiences any problems with fever, nausea      and vomiting, pain uncontrolled by her medication, incisional drainage      or redness, or any other concern.     Broo   BES/MEDQ  D:  01/12/2004  T:  01/12/2004  Job:  [id number]

## 2010-08-23 NOTE — Discharge Summary (Signed)
[facility]   Patient:    [name], [name]                   MRN: [id number] Adm. Date:  [id number] Disc. Date: 02/02/00 Attending:  Trevor Iha                           Discharge Summary  HISTORY OF PRESENT ILLNESS:     [name] is a 40 year old nulligravida white female with worsening abdominal pain and abnormal uterine bleeding not responsive to conservative medical management including oral contraceptive agents, anti-inflammatory medications, and narcotics.  She also had an ultrasound that shows probable fibroids.  She presents for laparotomy with myomectomy.  HOSPITAL COURSE:                The patient underwent a laparotomy.  Findings at that time were frozen pelvis consistent with endometriosis and pelvic adhesions.  She underwent lysis of adhesions and a uterine biopsy and it was felt it was either a degenerated fibroid or endometriosis.  The patients postoperative course was unremarkable with good return of bowel function and ambulation.  Postoperative hemoglobin on day #1 was 12.7.  By postoperative day #2 she was tolerating a regular diet, ambulating without difficulty, desiring discharge home.  Physical exam was within normal limits.  The patient received Depo-Lupron 3.75 mg IM before discharge.  DISPOSITION:                    The patient will be discharged home to follow up in the office in three days to have the staples removed and in two weeks for an incision check.  DISCHARGE DIAGNOSES:            1. Pelvic pain.                                 2. Abnormal uterine bleeding.                                 3. Endometriosis. DD:  02/02/00 TD:  02/02/00 Job: [id number]

## 2010-08-23 NOTE — H&P (Signed)
[facility]  Patient:    [name], [name]                     MRN: [id number] Attending:  Trevor Iha, M.D.                         History and Physical  DATE OF BIRTH:                08-24-1970  HISTORY OF PRESENT ILLNESS:   Ms. Fredderick Erb is a 40 year old nulligravida white female with worsening pelvic pain and abnormal uterine bleeding which are not responsive to oral contraceptive agents.  She has been on doubled up doses of oral contraceptive agents to achieve some regulation of her period.  She has known fibroids in the uterus, and again, pelvic not responding to either oral contraceptive agents or anti-inflammatory medications.  She presents for myomectomy today.  PAST MEDICAL HISTORY:         Significant for depression, currently on Effexor and Wellbutrin.  PAST SURGICAL HISTORY:        Negative.  PAST OBSTETRICAL HISTORY:     She is nulligravida.  PAST GYNECOLOGIC HISTORY:     History of fibroids.  MEDICATIONS:                  She is currently on Wellbutrin, Effexor, Vicodin, Ambien, and Ovcon as well as a multivitamin with iron.  PHYSICAL EXAMINATION:  VITAL SIGNS:                  Blood pressure 134/84.  HEART:                        Regular rate and rhythm.  LUNGS:                        Clear to auscultation bilaterally.  ABDOMEN:                      Obese, nondistended, nontender.  Uterus is anteverted, mobile, with more tenderness toward the right lower quadrant.  PELVIC:                       Slightly enlarged uterus by pelvic exam, difficult to tell and assess by pelvic exam due to patients size.  LABORATORY DATA:              Ultrasound shows 7.2 x 5.5 x 4.3 cm uterus with 8.6 mm stripe and 1.8 cm fibroid on right lateral wall with areas of degeneration, normal-appearing adnexa.  IMPRESSION AND PLAN:          1. Abnormal uterine bleeding.                               2. Fibroid by ultrasound.                      3. Pelvic pain not responsive to conservative                                  medical management including oral  contraceptive agents, anti-inflammatory                                  medications.  The patient is currently on doubled up dose of Ovcon 35 to control bleeding and currently on Vioxx and Vicodin for pain relief.  The patient desires fertility in the future.  She presents today for laparotomy with myomectomy. Risks and benefits were discussed at length including but not limited to risk of infection, bleeding, damage to tubes, uterus, ovaries, bowel, or bladder; risks associated with blood transfusion including but not limited to risk of infection such as HIV or hepatitis, reaction to the transfusion.  The patient was also offered autologous blood donation and declined.  Risks and benefits were discussed at length.  Informed consent was obtained.  The patient also understands that there is a possibility of recurrence of the fibroids in the future and also the need for cesarean section after the myomectomy.  She given her informed consent. DD:  01/30/00 TD:  01/30/00 Job: [id number]

## 2010-11-19 ENCOUNTER — Emergency Department (HOSPITAL_COMMUNITY)
Admission: EM | Admit: 2010-11-19 | Discharge: 2010-11-19 | Disposition: A | Discharge status: Home / Self Care | Payer: Medicare Other | Attending: Emergency Medicine | Admitting: Emergency Medicine

## 2010-11-19 ENCOUNTER — Encounter (HOSPITAL_COMMUNITY): Payer: Self-pay

## 2010-11-19 ENCOUNTER — Emergency Department (HOSPITAL_COMMUNITY): Payer: Medicare Other

## 2010-11-19 DIAGNOSIS — R51 Headache: Secondary | ICD-10-CM | POA: Insufficient documentation

## 2010-11-19 DIAGNOSIS — Z9889 Other specified postprocedural states: Secondary | ICD-10-CM | POA: Insufficient documentation

## 2010-11-19 DIAGNOSIS — M25569 Pain in unspecified knee: Secondary | ICD-10-CM | POA: Insufficient documentation

## 2010-11-19 DIAGNOSIS — J45909 Unspecified asthma, uncomplicated: Secondary | ICD-10-CM | POA: Insufficient documentation

## 2010-11-19 DIAGNOSIS — R10812 Left upper quadrant abdominal tenderness: Secondary | ICD-10-CM | POA: Insufficient documentation

## 2010-11-19 DIAGNOSIS — IMO0002 Reserved for concepts with insufficient information to code with codable children: Secondary | ICD-10-CM | POA: Insufficient documentation

## 2010-11-19 DIAGNOSIS — F17200 Nicotine dependence, unspecified, uncomplicated: Secondary | ICD-10-CM | POA: Insufficient documentation

## 2010-11-19 DIAGNOSIS — M542 Cervicalgia: Secondary | ICD-10-CM | POA: Insufficient documentation

## 2010-11-19 DIAGNOSIS — M25519 Pain in unspecified shoulder: Secondary | ICD-10-CM | POA: Insufficient documentation

## 2010-11-19 DIAGNOSIS — M549 Dorsalgia, unspecified: Secondary | ICD-10-CM | POA: Insufficient documentation

## 2010-11-19 DIAGNOSIS — S20219A Contusion of unspecified front wall of thorax, initial encounter: Secondary | ICD-10-CM | POA: Insufficient documentation

## 2010-11-19 DIAGNOSIS — R109 Unspecified abdominal pain: Secondary | ICD-10-CM | POA: Insufficient documentation

## 2010-11-19 DIAGNOSIS — F341 Dysthymic disorder: Secondary | ICD-10-CM | POA: Insufficient documentation

## 2010-11-19 DIAGNOSIS — I10 Essential (primary) hypertension: Secondary | ICD-10-CM | POA: Insufficient documentation

## 2010-11-19 DIAGNOSIS — K219 Gastro-esophageal reflux disease without esophagitis: Secondary | ICD-10-CM | POA: Insufficient documentation

## 2010-11-19 HISTORY — DX: Gastro-esophageal reflux disease without esophagitis: K21.9

## 2010-11-19 HISTORY — DX: Essential (primary) hypertension: I10

## 2010-11-19 MED ORDER — IOHEXOL 300 MG/ML  SOLN
100.0000 mL | Freq: Once | INTRAMUSCULAR | Status: AC | PRN
  Administered 2010-11-19: 100 mL via INTRAVENOUS

## 2010-12-01 ENCOUNTER — Emergency Department (HOSPITAL_COMMUNITY)
Admission: EM | Admit: 2010-12-01 | Discharge: 2010-12-01 | Disposition: A | Discharge status: Home / Self Care | Payer: No Typology Code available for payment source | Attending: Emergency Medicine | Admitting: Emergency Medicine

## 2010-12-01 ENCOUNTER — Encounter (HOSPITAL_COMMUNITY): Payer: Self-pay

## 2010-12-01 ENCOUNTER — Emergency Department (HOSPITAL_COMMUNITY): Payer: No Typology Code available for payment source

## 2010-12-01 DIAGNOSIS — Y92410 Unspecified street and highway as the place of occurrence of the external cause: Secondary | ICD-10-CM | POA: Insufficient documentation

## 2010-12-01 DIAGNOSIS — J45909 Unspecified asthma, uncomplicated: Secondary | ICD-10-CM | POA: Insufficient documentation

## 2010-12-01 DIAGNOSIS — S2220XA Unspecified fracture of sternum, initial encounter for closed fracture: Secondary | ICD-10-CM

## 2010-12-01 DIAGNOSIS — S20219A Contusion of unspecified front wall of thorax, initial encounter: Secondary | ICD-10-CM | POA: Insufficient documentation

## 2010-12-01 DIAGNOSIS — Z9079 Acquired absence of other genital organ(s): Secondary | ICD-10-CM | POA: Insufficient documentation

## 2010-12-01 DIAGNOSIS — K219 Gastro-esophageal reflux disease without esophagitis: Secondary | ICD-10-CM | POA: Insufficient documentation

## 2010-12-01 DIAGNOSIS — I10 Essential (primary) hypertension: Secondary | ICD-10-CM | POA: Insufficient documentation

## 2010-12-01 DIAGNOSIS — Z9884 Bariatric surgery status: Secondary | ICD-10-CM | POA: Insufficient documentation

## 2010-12-01 DIAGNOSIS — IMO0001 Reserved for inherently not codable concepts without codable children: Secondary | ICD-10-CM | POA: Insufficient documentation

## 2010-12-01 MED ORDER — METHOCARBAMOL 500 MG PO TABS
1000.0000 mg | ORAL_TABLET | Freq: Once | ORAL | Status: AC
  Administered 2010-12-01: 1000 mg via ORAL

## 2010-12-01 MED ORDER — OXYCODONE-ACETAMINOPHEN 5-325 MG PO TABS
1.0000 | ORAL_TABLET | Freq: Once | ORAL | Status: AC
  Administered 2010-12-01: 1 via ORAL
  Filled 2010-12-01: qty 1

## 2010-12-01 MED ORDER — OXYCODONE-ACETAMINOPHEN 5-325 MG PO TABS: 1.0000 | ORAL_TABLET | ORAL | Status: AC | PRN
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
## 2010-12-01 NOTE — ED Provider Notes (Signed)
11:41 PM Patient describes isolated pain in the upper sternum was gradually getting generalized left upper chest pain. No shortness of breath, dizziness, weakness. Vital signs here today are reassuring. Evaluation consistent with mild sternal fracture doubt cardiac or lung contusion.  Medical screening examination/treatment/procedure(s) were conducted as a shared visit with non-physician practitioner(s) and myself.  I personally evaluated the patient during the encounter  Flint Melter, MD 12/01/10 2342
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
## 2010-12-13 ENCOUNTER — Other Ambulatory Visit (INDEPENDENT_AMBULATORY_CARE_PROVIDER_SITE_OTHER): Payer: Self-pay | Admitting: General Surgery

## 2010-12-13 ENCOUNTER — Ambulatory Visit (INDEPENDENT_AMBULATORY_CARE_PROVIDER_SITE_OTHER): Payer: Medicaid Other | Admitting: Surgery

## 2010-12-13 ENCOUNTER — Encounter (INDEPENDENT_AMBULATORY_CARE_PROVIDER_SITE_OTHER): Payer: Self-pay | Admitting: Surgery

## 2010-12-13 VITALS — BP 116/86 | HR 112 | Temp 98.0°F | Ht 71.0 in | Wt 288.6 lb

## 2010-12-13 DIAGNOSIS — K219 Gastro-esophageal reflux disease without esophagitis: Secondary | ICD-10-CM

## 2010-12-13 DIAGNOSIS — K2100 Gastro-esophageal reflux disease with esophagitis, without bleeding: Secondary | ICD-10-CM
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
## 2010-12-20 ENCOUNTER — Ambulatory Visit
Admission: RE | Admit: 2010-12-20 | Discharge: 2010-12-20 | Disposition: A | Discharge status: Home / Self Care | Payer: Medicare Other | Source: Ambulatory Visit | Attending: Surgery | Admitting: Surgery

## 2010-12-20 DIAGNOSIS — K2100 Gastro-esophageal reflux disease with esophagitis, without bleeding: Secondary | ICD-10-CM

## 2010-12-23 ENCOUNTER — Telehealth (INDEPENDENT_AMBULATORY_CARE_PROVIDER_SITE_OTHER): Payer: Self-pay

## 2010-12-23 NOTE — Telephone Encounter (Signed)
Patient called in and said she took an 800mg  ibuprofen pill and it got stuck and wanted to know what she should do. I took this to [place] and she told me to tell her to try applesauce or pudding to help ease it down and if it didn't work she could go to ER to see if they can help get it unstuck.

## 2010-12-24 ENCOUNTER — Telehealth (INDEPENDENT_AMBULATORY_CARE_PROVIDER_SITE_OTHER): Payer: Self-pay | Admitting: General Surgery

## 2010-12-24 NOTE — Telephone Encounter (Signed)
Pt contacted office yesterday c/o an 800mg  motrin being stuck needing advise on how to get it to go down. Told her to try and drink something, eat some applesauce or pudding to help move it through. She called back today stating she still felt some discomfort in the middle of her chest. She would also like the results to her UGI and what are treatment plan is going to be.

## 2010-12-25 ENCOUNTER — Telehealth (INDEPENDENT_AMBULATORY_CARE_PROVIDER_SITE_OTHER): Payer: Self-pay | Admitting: General Surgery

## 2010-12-25 NOTE — Telephone Encounter (Signed)
Tried to contact pt lm on vm to call for results to UGI, pt needs to schedule a follow up appt to discuss findings and treatment plan

## 2011-02-01 ENCOUNTER — Inpatient Hospital Stay (HOSPITAL_COMMUNITY)
Admission: EM | Admit: 2011-02-01 | Discharge: 2011-02-06 | DRG: 354 | Disposition: A | Discharge status: Home / Self Care | Payer: Medicare Other | Attending: General Surgery | Admitting: General Surgery

## 2011-02-01 ENCOUNTER — Emergency Department (HOSPITAL_COMMUNITY): Payer: Medicare Other

## 2011-02-01 DIAGNOSIS — K430 Incisional hernia with obstruction, without gangrene: Principal | ICD-10-CM | POA: Diagnosis present

## 2011-02-01 DIAGNOSIS — F429 Obsessive-compulsive disorder, unspecified: Secondary | ICD-10-CM | POA: Diagnosis present

## 2011-02-01 DIAGNOSIS — K219 Gastro-esophageal reflux disease without esophagitis: Secondary | ICD-10-CM | POA: Diagnosis present

## 2011-02-01 DIAGNOSIS — I10 Essential (primary) hypertension: Secondary | ICD-10-CM | POA: Diagnosis present

## 2011-02-01 DIAGNOSIS — Z9884 Bariatric surgery status: Secondary | ICD-10-CM

## 2011-02-01 DIAGNOSIS — Z90710 Acquired absence of both cervix and uterus: Secondary | ICD-10-CM

## 2011-02-01 DIAGNOSIS — J449 Chronic obstructive pulmonary disease, unspecified: Secondary | ICD-10-CM | POA: Diagnosis present

## 2011-02-01 DIAGNOSIS — Z6841 Body Mass Index (BMI) 40.0 and over, adult: Secondary | ICD-10-CM

## 2011-02-01 DIAGNOSIS — J4489 Other specified chronic obstructive pulmonary disease: Secondary | ICD-10-CM | POA: Diagnosis present

## 2011-02-01 LAB — CBC
MCH: 33.5 pg (ref 26.0–34.0)
MCHC: 34 g/dL (ref 30.0–36.0)
Platelets: 196 10*3/uL (ref 150–400)
RDW: 12.8 % (ref 11.5–15.5)

## 2011-02-01 LAB — COMPREHENSIVE METABOLIC PANEL
ALT: 19 U/L (ref 0–35)
AST: 25 U/L (ref 0–37)
Albumin: 3.8 g/dL (ref 3.5–5.2)
Calcium: 9.3 mg/dL (ref 8.4–10.5)
GFR calc Af Amer: >90 mL/min (ref >90)
Potassium: 4.1 mEq/L (ref 3.5–5.1)
Sodium: 136 mEq/L (ref 135–145)
Total Protein: 7.4 g/dL (ref 6.0–8.3)

## 2011-02-01 LAB — DIFFERENTIAL
Basophils Absolute: 0 10*3/uL (ref 0.0–0.1)
Basophils Relative: 0 % (ref 0–1)
Eosinophils Absolute: 0.2 10*3/uL (ref 0.0–0.7)
Monocytes Absolute: 0.6 10*3/uL (ref 0.1–1.0)
Monocytes Relative: 5 % (ref 3–12)

## 2011-02-01 LAB — URINALYSIS, ROUTINE W REFLEX MICROSCOPIC
Hgb urine dipstick: NEGATIVE
Specific Gravity, Urine: 1.01 (ref 1.005–1.030)
Urobilinogen, UA: 0.2 mg/dL (ref 0.0–1.0)
pH: 6.5 (ref 5.0–8.0)

## 2011-02-02 ENCOUNTER — Inpatient Hospital Stay (HOSPITAL_COMMUNITY): Payer: Medicare Other

## 2011-02-02 DIAGNOSIS — K430 Incisional hernia with obstruction, without gangrene: Secondary | ICD-10-CM

## 2011-02-02 LAB — CBC
Hemoglobin: 13 g/dL (ref 12.0–15.0)
MCV: 98.8 fL (ref 78.0–100.0)
Platelets: 179 10*3/uL (ref 150–400)
RBC: 4.01 MIL/uL (ref 3.87–5.11)
WBC: 8.2 10*3/uL (ref 4.0–10.5)

## 2011-02-02 LAB — BASIC METABOLIC PANEL
CO2: 26 mEq/L (ref 19–32)
Chloride: 104 mEq/L (ref 96–112)
Creatinine, Ser: 0.71 mg/dL (ref 0.50–1.10)
Glucose, Bld: 99 mg/dL (ref 70–99)
Sodium: 137 mEq/L (ref 135–145)

## 2011-02-02 LAB — MRSA PCR SCREENING: MRSA by PCR: NEGATIVE

## 2011-02-02 MED ORDER — IOHEXOL 300 MG/ML  SOLN
100.0000 mL | Freq: Once | INTRAMUSCULAR | Status: AC | PRN
  Administered 2011-02-02: 100 mL via INTRAVENOUS
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
## 2011-02-03 LAB — BASIC METABOLIC PANEL
BUN: 8 mg/dL (ref 6–23)
Chloride: 105 mEq/L (ref 96–112)
Glucose, Bld: 119 mg/dL — ABNORMAL HIGH (ref 70–99)
Potassium: 4.1 mEq/L (ref 3.5–5.1)

## 2011-02-03 LAB — CBC
HCT: 36.1 % (ref 36.0–46.0)
Hemoglobin: 12 g/dL (ref 12.0–15.0)
WBC: 7.4 10*3/uL (ref 4.0–10.5)

## 2011-02-03 NOTE — Op Note (Signed)
NAMECHARMELLE, SOH              ACCOUNT NO.:  [id number]  MEDICAL RECORD NO.:  [id number]  LOCATION:  1508                         FACILITY:  [facility]  PHYSICIAN:  Sharlet Salina T. Blayne Frankie, M.D.DATE OF BIRTH:  1970-12-27  DATE OF PROCEDURE:  02/02/2011 DATE OF DISCHARGE:                              OPERATIVE REPORT   PREOPERATIVE DIAGNOSIS:  Incarcerated ventral incisional hernia.  POSTOPERATIVE DIAGNOSIS:  Incarcerated ventral incisional hernia.  PROCEDURE:  Open repair of incarcerated ventral incisional hernia with mesh.  SURGEON:  Lorne Skeens. Esmeralda Malay, MD  ANESTHESIA:  General.  BRIEF HISTORY:  Betzayda Braxton is a 40 year old female with multiple previous abdominal operations including several revisions of a Lap-Band and subsequent removal of Lap-Band laparoscopically.  She presents with acute onset of severe right upper quadrant epigastric abdominal pain. Plain x-ray has shown partial small bowel obstruction.  Examination revealed a tender mass in the right upper quadrant near one of her trocar sites.  I obtained an urgent CT scan which showed incarcerated small bowel and a ventral hernia at this site.  I have recommended immediate open repair under general anesthesia.  INDICATIONS FOR PROCEDURE:  Its nature, risks of bleeding, infection, anesthetic complications were discussed with the patient and her family. We discussed the possible need for small bowel resection.  She was then brought to the operating room for this procedure.  DESCRIPTION OF OPERATION:  The patient was brought to the operating room, placed in supine position on the operative table, and general endotracheal anesthesia was induced.  The abdomen was widely sterilely prepped and draped.  She received broad-spectrum preoperative antibiotics.  PAS were in place.  Correct patient and procedure were verified.  After induction of anesthesia, the palpable mass in the right upper quadrant resolved and I suspect  the hernia did reduce with general anesthesia.  I made an approximately 8 cm transverse incision over the area of the previous palpable abnormality and dissection was carried down through subcutaneous tissue with cautery.  I encountered a large hernia sac which was edematous and this was dissected free from surrounding tissue and freed up down to the fascial edge.  I then opened the hernia sac and it contained some serous fluid.  There were no contents in the hernia sac at this point.  The defect itself was not large, measuring about 3.5 cm circular and I suspect it was an old trocar site that had dilated.  I completely excised the hernia sac with cautery down to the level of fascia.  It was able to put a small Richardson in the defect and then carefully exploring through the defect, I brought up a loop of small bowel and this was run proximally and distally.  I encountered about a 20 cm segment of moderately hemorrhagic small bowel that was obviously area that was trapped in the hernia.  It was clearly viable.  The small bowel proximal to this was dilated, but otherwise normal.  The small bowel distal to this was decompressed.  There was certainly no evidence of necrosis or severe injury to the bowel.  The viscera was then returned to the abdomen.  I was able to bring the omentum down over the bowel  between this and the hernia defect.  Although the defect was small, the edges were somewhat fixed and I elected to use a mesh underlay repair.  I chose a piece of Parietex dual mesh, the 11 cm diameter circular piece and trimmed this down to about 9 cm in diameter.  I then placed 4 mattress sutures of 0 Novafil back well away from the defect about 4 cm in all directions, and incorporated the parietal side of the mesh with the sutures.  The mesh was then introduced into the abdomen.  The sutures were pulled up taut and tied down with nice deployment of the mesh in all directions out away from  the defect for about 4 cm.  At this point, the fascia and subcutaneous tissue were infiltrated with Exparel local anesthetic.  I then closed the fascia transversely over the top of the mesh with interrupted #1 Novafil.  The subcutaneous tissue was irrigated with complete hemostasis assured.  The skin was closed with staples.  Sponges and needle counts were correct.  The patient was taken to Recovery in good condition.     Lorne Skeens. Vanderbilt Ranieri, M.D.     Tory Emerald  D:  02/02/2011  T:  02/02/2011  Job:  [id number]  Electronically Signed by Glenna Fellows M.D. on 02/03/2011 12:39:10 PM
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
## 2011-02-04 LAB — BASIC METABOLIC PANEL
BUN: 4 mg/dL — ABNORMAL LOW (ref 6–23)
CO2: 29 mEq/L (ref 19–32)
Chloride: 104 mEq/L (ref 96–112)
Creatinine, Ser: 0.75 mg/dL (ref 0.50–1.10)
Glucose, Bld: 98 mg/dL (ref 70–99)

## 2011-02-06 LAB — BASIC METABOLIC PANEL
BUN: 6 mg/dL (ref 6–23)
Chloride: 104 mEq/L (ref 96–112)
Creatinine, Ser: 0.75 mg/dL (ref 0.50–1.10)
GFR calc Af Amer: >90 mL/min (ref >90)
GFR calc non Af Amer: >90 mL/min (ref >90)

## 2011-02-06 LAB — CBC
HCT: 37.9 % (ref 36.0–46.0)
MCHC: 33.2 g/dL (ref 30.0–36.0)
MCV: 97.4 fL (ref 78.0–100.0)
RDW: 12.7 % (ref 11.5–15.5)

## 2011-02-12 ENCOUNTER — Encounter (INDEPENDENT_AMBULATORY_CARE_PROVIDER_SITE_OTHER): Payer: Self-pay | Admitting: Surgery

## 2011-02-12 ENCOUNTER — Ambulatory Visit (INDEPENDENT_AMBULATORY_CARE_PROVIDER_SITE_OTHER): Payer: Medicare Other | Admitting: Surgery

## 2011-02-12 VITALS — BP 134/90 | HR 64 | Temp 97.2°F | Resp 18 | Ht 71.0 in | Wt 290.2 lb

## 2011-02-12 DIAGNOSIS — M6289 Other specified disorders of muscle: Secondary | ICD-10-CM

## 2011-02-12 DIAGNOSIS — M2420 Disorder of ligament, unspecified site: Secondary | ICD-10-CM

## 2011-02-12 DIAGNOSIS — M629 Disorder of muscle, unspecified: Secondary | ICD-10-CM

## 2011-02-12 NOTE — Progress Notes (Signed)
[name] 40 y.o.  Body mass index is 40.48 kg/(m^2).  There is no problem list on file for this patient.   No Known Allergies  Past Surgical History  Procedure Date  . Abdominal hysterectomy   . Abdominal surgery   . Cholecystectomy   . Lap band removal   . Hernia repair     hiatal hernia x3, abd hernia  . Umbilical hernia repair 01/10/11    emergency hernia repair due to hernia twisted in intestines   . Laparoscopic gastric banding 2005    was done with hiatus hernia repair.  redone in 2006   . Hiatal hernia repair     was done with lapband on two occasions.  [employer] mesh was used and removed   [employer], DO, DO No diagnosis found.  Christiann and her mother come in today after Dr.Hoxworth repair incarcerated trocar site hernia. Today we removed her staples. I spoke with her about long-term strategy regarding her weight and her recurrent hiatal hernia which is seen on the upper GI. She may well need a recurrent hiatal hernia repair but perhaps we could do a sleeve gastrectomy at the same time. All for her chart to [name] who can look into her eligibility for a sleeve resection. Return 4 weeks     [name], MD, [facility] [phone] beeper [phone]  02/12/2011 4:19 PM

## 2011-02-12 NOTE — Patient Instructions (Signed)
Tried to stay on a protein base.lose weight  See me back in 4 weeks and we can discuss weight-loss surgical options combined with hiatal hernia repair.

## 2011-03-19 ENCOUNTER — Encounter (INDEPENDENT_AMBULATORY_CARE_PROVIDER_SITE_OTHER): Payer: Self-pay | Admitting: Surgery

## 2011-03-19 ENCOUNTER — Ambulatory Visit (INDEPENDENT_AMBULATORY_CARE_PROVIDER_SITE_OTHER): Payer: Medicare Other | Admitting: Surgery

## 2011-03-19 VITALS — BP 134/96 | HR 64 | Temp 97.3°F | Resp 18 | Ht 71.0 in | Wt 289.2 lb

## 2011-03-19 DIAGNOSIS — E669 Obesity, unspecified: Secondary | ICD-10-CM

## 2011-03-19 NOTE — Patient Instructions (Signed)
Stay on 6 month weight loss plan See [name] in May

## 2011-03-19 NOTE — Progress Notes (Signed)
[name] 40 y.o.  Body mass index is 40.34 kg/(m^2).  There is no problem list on file for this patient.   No Known Allergies  Past Surgical History  Procedure Date  . Abdominal hysterectomy   . Abdominal surgery   . Cholecystectomy   . Lap band removal   . Hernia repair     hiatal hernia x3, abd hernia  . Umbilical hernia repair 01/10/11    emergency hernia repair due to hernia twisted in intestines   . Laparoscopic gastric banding 2005    was done with hiatus hernia repair.  redone in 2006   . Hiatal hernia repair     was done with lapband on two occasions.  [employer] mesh was used and removed   [employer], DO, DO No diagnosis found.  [name] and her mother come in today after [name] repair incarcerated trocar site hernia. Today we removed her staples. I spoke with her about long-term strategy regarding her weight and her recurrent hiatal hernia which is seen on the upper GI. She may well need a recurrent hiatal hernia repair but perhaps we could do a sleeve gastrectomy at the same time. All for her chart to [name] who can look into her eligibility for a sleeve resection. Return 4 weeks     Colleen Hamming, MD, [facility] [phone] beeper [phone]  03/19/2011 5:22 PM   [name] comes in today and we discussed potential sleeve gastrectomy. She has to do the six-month weight loss and will be doing a starting in January with Dr. Christell Walter. I'll see her again in May we can see how well she is done she'll qualify to move forward with sleeve gastrectomy. As need be done in conjunction with a hiatal hernia repair. We'll discuss the more immediate ramifications at that point.she's also had some pain in the size or [name] were prepared her trocar hernia. I do not feel any recurrent hernia if. She was having some pain with coughing. I told her this was likely due to the mesh pulling.  Plan return May 2013

## 2011-07-28 ENCOUNTER — Telehealth (INDEPENDENT_AMBULATORY_CARE_PROVIDER_SITE_OTHER): Payer: Self-pay | Admitting: General Surgery

## 2011-07-28 NOTE — Telephone Encounter (Signed)
Called patient back to advise that the request was received from the attorney's office. However, [name] was out of the office last week and scheduled for surgeries today and tomorrow. I advised her that he has clinic on Wednesday and I will call her once he has completed her request. Patient agreed and confirmed a message can be left on her cell phone.

## 2011-07-29 ENCOUNTER — Telehealth (INDEPENDENT_AMBULATORY_CARE_PROVIDER_SITE_OTHER): Payer: Self-pay | Admitting: Surgery

## 2011-08-05 ENCOUNTER — Telehealth (INDEPENDENT_AMBULATORY_CARE_PROVIDER_SITE_OTHER): Payer: Self-pay | Admitting: General Surgery
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
## 2011-08-11 ENCOUNTER — Telehealth (INDEPENDENT_AMBULATORY_CARE_PROVIDER_SITE_OTHER): Payer: Self-pay | Admitting: General Surgery

## 2011-08-11 NOTE — Telephone Encounter (Signed)
Pt calling to ask [name] to try to call her attorney (9am-5pm) at [phone], so they can settle her case.  Her attorney is [employer].  This is apparently the "last thing they need."  She understands [name] has attempted to call but was not able to get through to anyone.

## 2011-08-13 ENCOUNTER — Telehealth (INDEPENDENT_AMBULATORY_CARE_PROVIDER_SITE_OTHER): Payer: Self-pay | Admitting: Surgery

## 2011-08-15 ENCOUNTER — Telehealth (INDEPENDENT_AMBULATORY_CARE_PROVIDER_SITE_OTHER): Payer: Self-pay | Admitting: General Surgery

## 2011-08-15 NOTE — Telephone Encounter (Signed)
Called attorney's office, spoke with [name] directly and advised that [name] knows of her request, but they have been playing phone tag. [name] understood. I requested that they submit their questions in written format and fax over to our office to my attention, or order to get the information directly to [name] next  Week ( he is expected for a nurse only visit on (08/18/11) so they can get the questions answered for [name]'s case. [name] asked if there was a charge for the consultation request. I advised I did not know what if anything the charge would be, but would try to find out. I requested she submit that request on the cover sheet of the fax to be sent. [name] said she most likely could not get the fax to our office until next week. I apologized to her for duration of time her office has waited, but advised that due to his schedule it is difficult to get a specific time for this to be taken care of and written format would be best.

## 2011-08-20 ENCOUNTER — Ambulatory Visit (INDEPENDENT_AMBULATORY_CARE_PROVIDER_SITE_OTHER): Payer: Medicare Other | Admitting: Surgery

## 2011-08-20 ENCOUNTER — Telehealth (INDEPENDENT_AMBULATORY_CARE_PROVIDER_SITE_OTHER): Payer: Self-pay | Admitting: General Surgery

## 2011-08-20 ENCOUNTER — Encounter (INDEPENDENT_AMBULATORY_CARE_PROVIDER_SITE_OTHER): Payer: Self-pay | Admitting: Surgery

## 2011-08-20 VITALS — BP 124/68 | HR 80 | Temp 98.6°F | Resp 16 | Ht 71.0 in | Wt 280.0 lb

## 2011-08-20 DIAGNOSIS — K449 Diaphragmatic hernia without obstruction or gangrene: Secondary | ICD-10-CM
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
## 2011-08-20 NOTE — Patient Instructions (Signed)
Followup with Dr. Yevonne Pax for upper endoscopy

## 2011-08-20 NOTE — Telephone Encounter (Signed)
Pt states she was seen today by [name] and referred to gastroenterologist.  She is calling to let us know about her appt (on 08/28/11 with Zachery Dakins) and requesting we send records regarding the hiatal hernia to them.  FAX records to [phone].

## 2011-08-21 ENCOUNTER — Telehealth (INDEPENDENT_AMBULATORY_CARE_PROVIDER_SITE_OTHER): Payer: Self-pay | Admitting: General Surgery

## 2011-08-21 NOTE — Telephone Encounter (Signed)
Called patient back on cell phone and left a message (had prior approval) with regard to her request for information to be sent to her gastroenterologist after her appointment with [name] today. Advised she will need to sign a release allowing [place] to forward the information requested to Dr. Zachery Dakins (fax [phone]). Advised the form could be faxed to her or she can come to the office and sign it.

## 2011-08-25 ENCOUNTER — Other Ambulatory Visit (INDEPENDENT_AMBULATORY_CARE_PROVIDER_SITE_OTHER): Payer: Self-pay | Admitting: General Surgery

## 2011-08-25 DIAGNOSIS — K449 Diaphragmatic hernia without obstruction or gangrene: Secondary | ICD-10-CM

## 2011-09-12 ENCOUNTER — Emergency Department (HOSPITAL_COMMUNITY): Payer: Medicare Other

## 2011-09-12 ENCOUNTER — Emergency Department (HOSPITAL_COMMUNITY)
Admission: EM | Admit: 2011-09-12 | Discharge: 2011-09-12 | Disposition: A | Discharge status: Home / Self Care | Payer: Medicare Other | Attending: Emergency Medicine | Admitting: Emergency Medicine

## 2011-09-12 ENCOUNTER — Encounter (HOSPITAL_COMMUNITY): Payer: Self-pay | Admitting: Emergency Medicine

## 2011-09-12 DIAGNOSIS — K219 Gastro-esophageal reflux disease without esophagitis: Secondary | ICD-10-CM | POA: Insufficient documentation

## 2011-09-12 DIAGNOSIS — I10 Essential (primary) hypertension: Secondary | ICD-10-CM | POA: Insufficient documentation

## 2011-09-12 DIAGNOSIS — IMO0002 Reserved for concepts with insufficient information to code with codable children: Secondary | ICD-10-CM | POA: Insufficient documentation

## 2011-09-12 DIAGNOSIS — M25519 Pain in unspecified shoulder: Secondary | ICD-10-CM | POA: Insufficient documentation

## 2011-09-12 DIAGNOSIS — S93409A Sprain of unspecified ligament of unspecified ankle, initial encounter: Secondary | ICD-10-CM

## 2011-09-12 DIAGNOSIS — W19XXXA Unspecified fall, initial encounter: Secondary | ICD-10-CM

## 2011-09-12 DIAGNOSIS — J45909 Unspecified asthma, uncomplicated: Secondary | ICD-10-CM | POA: Insufficient documentation

## 2011-09-12 DIAGNOSIS — M79609 Pain in unspecified limb: Secondary | ICD-10-CM | POA: Insufficient documentation

## 2011-09-12 DIAGNOSIS — T07XXXA Unspecified multiple injuries, initial encounter: Secondary | ICD-10-CM

## 2011-09-12 DIAGNOSIS — W010XXA Fall on same level from slipping, tripping and stumbling without subsequent striking against object, initial encounter: Secondary | ICD-10-CM | POA: Insufficient documentation

## 2011-09-12 DIAGNOSIS — R209 Unspecified disturbances of skin sensation: Secondary | ICD-10-CM | POA: Insufficient documentation

## 2011-09-12 DIAGNOSIS — M25579 Pain in unspecified ankle and joints of unspecified foot: Secondary | ICD-10-CM | POA: Insufficient documentation

## 2011-09-12 DIAGNOSIS — S8263XA Displaced fracture of lateral malleolus of unspecified fibula, initial encounter for closed fracture: Secondary | ICD-10-CM | POA: Insufficient documentation

## 2011-09-12 MED ORDER — TETANUS-DIPHTH-ACELL PERTUSSIS 5-2.5-18.5 LF-MCG/0.5 IM SUSP
0.5000 mL | Freq: Once | INTRAMUSCULAR | Status: AC
  Administered 2011-09-12: 0.5 mL via INTRAMUSCULAR
  Filled 2011-09-12: qty 0.5

## 2011-09-12 MED ORDER — OXYCODONE-ACETAMINOPHEN 5-325 MG PO TABS: 1.0000 | ORAL_TABLET | Freq: Four times a day (QID) | ORAL | Status: AC | PRN

## 2011-09-12 MED ORDER — OXYCODONE-ACETAMINOPHEN 5-325 MG PO TABS
1.0000 | ORAL_TABLET | Freq: Once | ORAL | Status: AC
  Administered 2011-09-12: 1 via ORAL
  Filled 2011-09-12: qty 1
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
## 2011-09-12 NOTE — ED Provider Notes (Signed)
Medical screening examination/treatment/procedure(s) were conducted as a shared visit with non-physician practitioner(s) and myself.  I personally evaluated the patient during the encounter Inversion injury to right ankle, paresthesias in foot.  + avulsion to lat maleolus.  Will give cam walker.   Cheri Guppy, MD 09/12/11 [phone]

## 2011-09-12 NOTE — ED Provider Notes (Signed)
Medical screening examination/treatment/procedure(s) were conducted as a shared visit with non-physician practitioner(s) and myself.  I personally evaluated the patient during the encounter  Cheri Guppy, MD 09/12/11 1701

## 2011-09-12 NOTE — ED Provider Notes (Signed)
History     CSN: [id number]  Arrival date & time 09/12/11  1336   First MD Initiated Contact with Patient 09/12/11 1426      Chief Complaint  Patient presents with  . Fall    pt stumbled on a cement walk and fell , striking, l/shoulder, l/hand and r/foot  . Shoulder Pain  . Hand Pain  . Foot Injury    (Consider location/radiation/quality/duration/timing/severity/associated sxs/prior treatment) Patient is a 41 y.o. female presenting with fall. The history is provided by the patient.  Fall The accident occurred 1 to 2 hours ago. The fall occurred while walking. Distance fallen: tripped over a curb, fell onto concrete. There was no blood loss. Point of impact: left side of body. Pain location: left shoulder, left hand, right ankle. The pain is severe. She was not ambulatory at the scene. There was no entrapment after the fall. There was no drug use involved in the accident. There was no alcohol use involved in the accident. Associated symptoms include numbness and tingling. Pertinent negatives include no visual change, no fever, no abdominal pain, no nausea, no vomiting, no headaches, no hearing loss and no loss of consciousness. The symptoms are aggravated by use of the injured limb. Prehospitalization: none. She has tried ice for the symptoms. The treatment provided no relief.    Past Medical History  Diagnosis Date  . Hypertension   . Asthma   . GERD (gastroesophageal reflux disease) t  . Endometriosis t  . Gastric bypass status for obesity t  . S/P cholecystectomy t  . Hernia, diaphragmatic t  . Hx of hysterectomy t  . Arthritis     Past Surgical History  Procedure Date  . Abdominal hysterectomy   . Abdominal surgery   . Cholecystectomy   . Lap band removal   . Hernia repair     hiatal hernia x3, abd hernia  . Umbilical hernia repair 01/10/11    emergency hernia repair due to hernia twisted in intestines   . Laparoscopic gastric banding 2005    was done with hiatus  hernia repair.  redone in 2006   . Hiatal hernia repair     was done with lapband on two occasions.  [place] mesh was used and removed    Family History  Problem Relation Age of Onset  . Cancer Maternal Aunt     melanoma    History  Substance Use Topics  . Smoking status: Never Smoker   . Smokeless tobacco: Never Used  . Alcohol Use: No     Review of Systems  Constitutional: Negative for fever and activity change.  HENT: Negative for ear pain and neck pain.   Eyes: Negative for pain and visual disturbance.  Respiratory: Negative for shortness of breath.   Cardiovascular: Negative for chest pain.  Gastrointestinal: Negative for nausea, vomiting and abdominal pain.  Musculoskeletal:       See HPI, otherwise negative  Skin: Positive for wound (multiple abrasions).  Neurological: Positive for tingling, weakness (to left ankle, left shoulder) and numbness. Negative for dizziness, loss of consciousness, syncope, speech difficulty, light-headedness and headaches.  Psychiatric/Behavioral: Confusion: for a few moments after incident, resolved.    Allergies  Review of patient's allergies indicates no known allergies.  Home Medications   Current Outpatient Rx  Name Route Sig Dispense Refill  . ALBUTEROL SULFATE HFA 108 (90 BASE) MCG/ACT IN AERS Inhalation Inhale 2 puffs into the lungs every 6 (six) hours as needed. For shortness of breath    .  ALPRAZOLAM 1 MG PO TABS Oral Take 1 mg by mouth 3 (three) times daily as needed. anxiety     . CARBAMAZEPINE 200 MG PO TABS Oral Take 200 mg by mouth 2 (two) times daily.     Marland Kitchen CETIRIZINE HCL 10 MG PO CHEW Oral Chew 10 mg by mouth daily.    . CYCLOSPORINE 0.05 % OP EMUL Both Eyes Place 1 drop into both eyes daily.    Marland Kitchen DICLOFENAC SODIUM 75 MG PO TBEC Oral Take 75 mg by mouth 2 (two) times daily.      Marland Kitchen DICYCLOMINE HCL 20 MG PO TABS Oral Take 20 mg by mouth every 6 (six) hours.    Marland Kitchen ESOMEPRAZOLE MAGNESIUM 40 MG PO CPDR Oral Take 40 mg by  mouth daily before breakfast.      . FLUTICASONE PROPIONATE 50 MCG/ACT NA SUSP Nasal Place 2 sprays into the nose daily.    Marland Kitchen GABAPENTIN 300 MG PO CAPS Oral Take 300 mg by mouth 3 (three) times daily.    Marland Kitchen LAMOTRIGINE 100 MG PO TABS Oral Take 100 mg by mouth 2 (two) times daily.      Marland Kitchen LISINOPRIL-HYDROCHLOROTHIAZIDE 10-12.5 MG PO TABS Oral Take 1 tablet by mouth daily.    Marland Kitchen PROMETHAZINE HCL 25 MG PO TABS Oral Take 25 mg by mouth every 6 (six) hours as needed. For nausea    . TRAZODONE HCL 100 MG PO TABS Oral Take 200 mg by mouth at bedtime.        BP 110/66  Pulse 96  Resp 18  SpO2 99%  Physical Exam  Nursing note reviewed. Constitutional: She is oriented to person, place, and time. She appears well-developed and well-nourished.       Vital signs are reviewed and are normal. Uncomfortable appearing.  HENT:  Head: Normocephalic and atraumatic.  Right Ear: External ear normal.  Left Ear: External ear normal.       MMM  Eyes: Conjunctivae are normal. Pupils are equal, round, and reactive to light.  Neck: Normal range of motion. Neck supple. No spinous process tenderness and no muscular tenderness present.  Cardiovascular: Normal rate and regular rhythm.   Pulmonary/Chest: Effort normal and breath sounds normal. No respiratory distress. She has no wheezes. She exhibits no tenderness.  Abdominal: Soft. She exhibits no distension. There is no tenderness.  Musculoskeletal:       Right shoulder: Normal.       Left shoulder: She exhibits decreased range of motion, tenderness, bony tenderness, pain and decreased strength (4+/5 on left as compared to 5/5 on right). She exhibits no swelling, no deformity, no laceration and normal pulse.       Right elbow: Normal.      Left elbow: Normal.       Right wrist: Normal.       Left wrist: She exhibits tenderness, bony tenderness (over very distal aspect of ulna) and swelling. She exhibits normal range of motion, no deformity and no laceration.        Right hip: She exhibits no tenderness.       Left hip: She exhibits no tenderness.       Right knee: She exhibits erythema (abrasion). She exhibits normal range of motion, no swelling, no effusion, no deformity and normal patellar mobility. no tenderness found.       Left knee: She exhibits erythema (abrasion). She exhibits normal range of motion, no swelling, no effusion, no deformity, no laceration and normal patellar mobility. no tenderness  found.       Right ankle: She exhibits decreased range of motion, swelling, ecchymosis and deformity. She exhibits no laceration. Abnormal pulses: detectable by doppler, strong but difficult to palpate. tenderness. Medial malleolus, AITFL, CF ligament and posterior TFL tenderness found. No lateral malleolus (numbness over lateral malleolus) and no head of 5th metatarsal tenderness found.       Left ankle: Normal.       Cervical back: Normal.       Thoracic back: Normal.       Lumbar back: Normal.       Left hand: She exhibits decreased range of motion, tenderness (over entire 5th metacarpal, 5th proximal phalanx, 4th PIP- all on left), bony tenderness and deformity (5th digit held in full extension at MCP joint, pt able to bring to neutral but does not flex MCP further of flex PIP/DIP at all). She exhibits normal capillary refill, no laceration and no swelling. decreased sensation (to the 5th digit) noted. Decreased strength (in movement of 5th digit in all fields) noted.       Right foot: She exhibits decreased range of motion (unable to actively move digits 2-5. Sensation decreased to these digits.). She exhibits no tenderness and no deformity.       Left foot: Normal.       Feet:  Neurological: She is alert and oriented to person, place, and time. No cranial nerve deficit (3-12 intact).  Skin: Skin is warm.  Psychiatric: She has a normal mood and affect.    ED Course  Procedures (including critical care time)  Labs Reviewed - No data to display Dg  Ankle Complete Right  09/12/2011  *RADIOLOGY REPORT*  Clinical Data: Fall, foot injury  RIGHT ANKLE - COMPLETE 3+ VIEW  Comparison: None.  Findings: Three views of the right ankle submitted.  There is avulsion fracture at the tip of distal right fibula lateral malleolus.  There is adjacent soft tissue swelling.  Ankle mortise is preserved.  Dorsal spurring noted tarsal region.  Plantar spur of the calcaneus is noted.  IMPRESSION:    Avulsion fracture at the tip of distal fibula lateral malleolus with adjacent soft tissue swelling.  Original Report Authenticated By: Natasha Mead, M.D.   Dg Shoulder Left  09/12/2011  *RADIOLOGY REPORT*  Clinical Data: Fall  LEFT SHOULDER - 2+ VIEW  Comparison: None.  Findings: No acute fracture and no dislocation.  Unremarkable soft tissues.  IMPRESSION: No acute bony pathology.  Original Report Authenticated By: Donavan Burnet, M.D.   Dg Hand Complete Left  09/12/2011  *RADIOLOGY REPORT*  Clinical Data: Fall.  Pain in left hand.  LEFT HAND - COMPLETE 3+ VIEW  Comparison: None.  Findings: The joints of the hand are normally aligned.  No acute or healing fracture is identified.  There is a well corticated ulna styloid fragment that could reflect an old fracture with nonunion or congenital variation.  No radiopaque foreign body discrete soft tissue swelling appreciated.  IMPRESSION: No acute bony abnormality.  Original Report Authenticated By: Britta Mccreedy, M.D.     1. Fall   2. Avulsion fracture of lateral malleolus   3. Grade 3 ankle sprain   4. Abrasions of multiple sites       MDM  Mechanical fall. Imaging studies reviewed with findings as above. I personally viewed all images. Left shoulder and hand negative. On re-exam, pt has full ROM of both left shoulder and all digits of left hand with some pain. Ankle with  lateral malleolus tip avulsion fx suggestive of sig ankle sprain. CAM walker placed in ED. Decreased sensation likely secondary to swelling. No pain on  passive ROM of digits to suggest compartment syndrome. Foot warm with good pulse, not c/w arterial compromise. Pt advised to f/u with orthopedics for recheck.        Shaaron Adler, PA-C 09/12/11 1640

## 2011-09-12 NOTE — Discharge Instructions (Signed)
As we discussed, your ankle is very badly sprained to the point that there is a very small fracture at the end of your fibula. Use the cam walker boot for support and to allow you to walk on the ankle earlier. Apply ice to your injured areas for 15-20 minutes three times a day for the next several days to help reduce swelling and inflammation. As you are already taking voltaren, no additional anti-inflammatory is prescribed. If you are not taking this medication, you should take ibuprofen 3 times a day to reduce inflammation and swelling. Use the oxycodone as needed for severe pain. Follow-up with the orthopedic doctor for further care regarding your ankle.  If you do not begin to have improved sensation in your toes or if your pain becomes significantly worse, you should be seen again immediately.    Ankle Sprain An ankle sprain is an injury to the strong, fibrous tissues (ligaments) that hold the bones of your ankle joint together.  CAUSES Ankle sprain usually is caused by a fall or by twisting your ankle. People who participate in sports are more prone to these types of injuries.  SYMPTOMS  Symptoms of ankle sprain include:  Pain in your ankle. The pain may be present at rest or only when you are trying to stand or walk.   Swelling.   Bruising. Bruising may develop immediately or within 1 to 2 days after your injury.   Difficulty standing or walking.  DIAGNOSIS  Your caregiver will ask you details about your injury and perform a physical exam of your ankle to determine if you have an ankle sprain. During the physical exam, your caregiver will press and squeeze specific areas of your foot and ankle. Your caregiver will try to move your ankle in certain ways. An X-ray exam may be done to be sure a bone was not broken or a ligament did not separate from one of the bones in your ankle (avulsion).  TREATMENT  Certain types of braces can help stabilize your ankle. Your caregiver can make a  recommendation for this. Your caregiver may recommend the use of medication for pain. If your sprain is severe, your caregiver may refer you to a surgeon who helps to restore function to parts of your skeletal system (orthopedist) or a physical therapist. HOME CARE INSTRUCTIONS  Apply ice to your injury for 1 to 2 days or as directed by your caregiver. Applying ice helps to reduce inflammation and pain.  Put ice in a plastic bag.   Place a towel between your skin and the bag.   Leave the ice on for 15 to 20 minutes at a time, every 2 hours while you are awake.   Take over-the-counter or prescription medicines for pain, discomfort, or fever only as directed by your caregiver.   Keep your injured leg elevated, when possible, to lessen swelling.   If your caregiver recommends crutches, use them as instructed. Gradually, put weight on the affected ankle. Continue to use crutches or a cane until you can walk without feeling pain in your ankle.   If you have a plaster splint, wear the splint as directed by your caregiver. Do not rest it on anything harder than a pillow the first 24 hours. Do not put weight on it. Do not get it wet. You may take it off to take a shower or bath.   You may have been given an elastic bandage to wear around your ankle to provide support. If the  elastic bandage is too tight (you have numbness or tingling in your foot or your foot becomes cold and blue), adjust the bandage to make it comfortable.   If you have an air splint, you may blow more air into it or let air out to make it more comfortable. You may take your splint off at night and before taking a shower or bath.   Wiggle your toes in the splint several times per day if you are able.  SEEK MEDICAL CARE IF:   You have an increase in bruising, swelling, or pain.   Your toes feel cold.   Pain relief is not achieved with medication.  SEEK IMMEDIATE MEDICAL CARE IF: Your toes are numb or blue or you have severe  pain. MAKE SURE YOU:   Understand these instructions.   Will watch your condition.   Will get help right away if you are not doing well or get worse.  Document Released: 03/24/2005 Document Revised: 03/13/2011 Document Reviewed: 10/27/2007 [facility] Patient Information 2012 [place], [place].        Abrasions An abrasion is a scraped area on the skin. Abrasions do not go through all layers of the skin.  HOME CARE  Change any bandages (dressings) as told by your doctor. If the bandage sticks, soak it off with warm, soapy water. Change the bandage if it gets wet, dirty, or starts to smell.   Wash the area with soap and water twice a day. Rinse off the soap. Pat the area dry with a clean towel.   Look at the injured area for signs of infection. Infection signs include redness, puffiness (swelling), tenderness, or yellowish white fluid (pus) coming from the wound.   Apply medicated cream as told by your doctor.   Only take medicine as told by your doctor.   Follow up with your doctor as told.  GET HELP RIGHT AWAY IF:   You have more pain in your wound.   You have redness, puffiness (swelling), or tenderness around your wound.   You have yellowish white fluid (pus) coming from your wound.   You have a fever.   A bad smell is coming from the wound or bandage.  MAKE SURE YOU:   Understand these instructions.   Will watch your condition.   Will get help right away if you are not doing well or get worse.  Document Released: 09/10/2007 Document Revised: 03/13/2011 Document Reviewed: 02/25/2011 [facility] Patient Information 2012 [place], [place].

## 2011-09-18 ENCOUNTER — Encounter (INDEPENDENT_AMBULATORY_CARE_PROVIDER_SITE_OTHER): Payer: Medicare Other | Admitting: Surgery

## 2011-10-30 ENCOUNTER — Encounter (INDEPENDENT_AMBULATORY_CARE_PROVIDER_SITE_OTHER): Payer: Medicare Other | Admitting: Surgery

## 2013-06-23 ENCOUNTER — Emergency Department (HOSPITAL_COMMUNITY)
Admission: EM | Admit: 2013-06-23 | Discharge: 2013-06-23 | Disposition: A | Discharge status: Home / Self Care | Payer: PRIVATE HEALTH INSURANCE | Attending: Emergency Medicine | Admitting: Emergency Medicine

## 2013-06-23 ENCOUNTER — Encounter (HOSPITAL_COMMUNITY): Payer: Self-pay | Admitting: Emergency Medicine

## 2013-06-23 ENCOUNTER — Emergency Department (HOSPITAL_COMMUNITY): Payer: PRIVATE HEALTH INSURANCE

## 2013-06-23 DIAGNOSIS — F17200 Nicotine dependence, unspecified, uncomplicated: Secondary | ICD-10-CM | POA: Insufficient documentation

## 2013-06-23 DIAGNOSIS — Z79899 Other long term (current) drug therapy: Secondary | ICD-10-CM | POA: Insufficient documentation

## 2013-06-23 DIAGNOSIS — M129 Arthropathy, unspecified: Secondary | ICD-10-CM | POA: Insufficient documentation

## 2013-06-23 DIAGNOSIS — Z8742 Personal history of other diseases of the female genital tract: Secondary | ICD-10-CM | POA: Insufficient documentation

## 2013-06-23 DIAGNOSIS — S93409A Sprain of unspecified ligament of unspecified ankle, initial encounter: Secondary | ICD-10-CM | POA: Insufficient documentation

## 2013-06-23 DIAGNOSIS — Y939 Activity, unspecified: Secondary | ICD-10-CM | POA: Insufficient documentation

## 2013-06-23 DIAGNOSIS — S93401A Sprain of unspecified ligament of right ankle, initial encounter: Secondary | ICD-10-CM

## 2013-06-23 DIAGNOSIS — I10 Essential (primary) hypertension: Secondary | ICD-10-CM | POA: Insufficient documentation

## 2013-06-23 DIAGNOSIS — W010XXA Fall on same level from slipping, tripping and stumbling without subsequent striking against object, initial encounter: Secondary | ICD-10-CM | POA: Insufficient documentation

## 2013-06-23 DIAGNOSIS — Z8781 Personal history of (healed) traumatic fracture: Secondary | ICD-10-CM | POA: Insufficient documentation

## 2013-06-23 DIAGNOSIS — Z791 Long term (current) use of non-steroidal anti-inflammatories (NSAID): Secondary | ICD-10-CM | POA: Insufficient documentation

## 2013-06-23 DIAGNOSIS — Z8719 Personal history of other diseases of the digestive system: Secondary | ICD-10-CM | POA: Insufficient documentation

## 2013-06-23 DIAGNOSIS — Y929 Unspecified place or not applicable: Secondary | ICD-10-CM | POA: Insufficient documentation

## 2013-06-23 DIAGNOSIS — J45909 Unspecified asthma, uncomplicated: Secondary | ICD-10-CM | POA: Insufficient documentation

## 2013-06-23 MED ORDER — NAPROXEN 500 MG PO TABS: 500.0000 mg | ORAL_TABLET | Freq: Two times a day (BID) | ORAL | Status: DC

## 2013-06-23 MED ORDER — KETOROLAC TROMETHAMINE 60 MG/2ML IM SOLN
60.0000 mg | Freq: Once | INTRAMUSCULAR | Status: AC
  Administered 2013-06-23: 60 mg via INTRAMUSCULAR
  Filled 2013-06-23: qty 2

## 2013-06-23 NOTE — ED Notes (Signed)
Pt states her cat and dog were playing and got up under her causing her to fall  Pt states she landed in the closet  Pt states she has pain in her right ankle  Pt has a bruise on her left side  No other injuries to note

## 2013-06-23 NOTE — Discharge Instructions (Signed)

## 2013-06-23 NOTE — ED Provider Notes (Signed)
CSN: [id number]     Arrival date & time 06/23/13  0245 History   First MD Initiated Contact with Patient 06/23/13 0305     Chief Complaint  Patient presents with  . Ankle Injury     (Consider location/radiation/quality/duration/timing/severity/associated sxs/prior Treatment) HPI Comments: 43 year old female, history of right ankle fracture in the past presents after having an injury to her right ankle this evening when she was accidentally tripped by her 2 pads. She is unsure how she landed on her ankle but she fell to the floor and had acute onset of right ankle pain. The pain is located across the dorsum of the ankle as well as the lateral surface of the right ankle. There is no associated knee pain and she states that she is able to walk but extremely gingerly because of the severe pain. There is associated swelling and bruising laterally. This occurred approximately 3 hours prior to evaluation  Patient is a 43 y.o. female presenting with lower extremity injury. The history is provided by the patient.  Ankle Injury    Past Medical History  Diagnosis Date  . Hypertension   . Asthma   . GERD (gastroesophageal reflux disease) t  . Endometriosis t  . Gastric bypass status for obesity t  . S/P cholecystectomy t  . Hernia, diaphragmatic t  . Hx of hysterectomy t  . Arthritis    Past Surgical History  Procedure Laterality Date  . Abdominal hysterectomy    . Abdominal surgery    . Cholecystectomy    . Lap band removal    . Hernia repair      hiatal hernia x3, abd hernia  . Umbilical hernia repair  01/10/11    emergency hernia repair due to hernia twisted in intestines   . Laparoscopic gastric banding  2005    was done with hiatus hernia repair.  redone in 2006   . Hiatal hernia repair      was done with lapband on two occasions.  [place] mesh was used and removed   Family History  Problem Relation Age of Onset  . Cancer Maternal Aunt     melanoma  . Diabetes Father   .  Heart attack Father    History  Substance Use Topics  . Smoking status: Current Some Day Smoker    Types: Cigarettes  . Smokeless tobacco: Never Used  . Alcohol Use: No   OB History   Grav Para Term Preterm Abortions TAB SAB Ect Mult Living                 Review of Systems  Musculoskeletal: Positive for joint swelling.  Skin: Positive for color change.  Neurological: Negative for numbness.      Allergies  Review of patient's allergies indicates no known allergies.  Home Medications   Current Outpatient Rx  Name  Route  Sig  Dispense  Refill  . acetaminophen (TYLENOL) 500 MG tablet   Oral   Take 1,000 mg by mouth every 6 (six) hours as needed for mild pain or headache.         . albuterol (PROVENTIL HFA;VENTOLIN HFA) 108 (90 BASE) MCG/ACT inhaler   Inhalation   Inhale 2 puffs into the lungs every 6 (six) hours as needed. For shortness of breath         . ALPRAZolam (XANAX) 1 MG tablet   Oral   Take 0.5 mg by mouth 3 (three) times daily as needed for anxiety or sleep. anxiety         .  azelastine (ASTELIN) 137 MCG/SPRAY nasal spray   Each Nare   Place 2 sprays into both nostrils 2 (two) times daily as needed for rhinitis. Use in each nostril as directed         . cholecalciferol (VITAMIN D) 1000 UNITS tablet   Oral   Take 1,000 Units by mouth daily.         . diclofenac (VOLTAREN) 75 MG EC tablet   Oral   Take 75 mg by mouth 2 (two) times daily.           Marland Kitchen gabapentin (NEURONTIN) 100 MG capsule   Oral   Take 100 mg by mouth 3 (three) times daily.         Marland Kitchen lamoTRIgine (LAMICTAL) 100 MG tablet   Oral   Take 100 mg by mouth 2 (two) times daily.           Marland Kitchen lisinopril-hydrochlorothiazide (PRINZIDE,ZESTORETIC) 10-12.5 MG per tablet   Oral   Take 1 tablet by mouth daily.         . promethazine (PHENERGAN) 25 MG tablet   Oral   Take 25 mg by mouth every 6 (six) hours as needed. For nausea         . senna (SENOKOT) 8.6 MG TABS tablet    Oral   Take 1 tablet by mouth daily.         . Sodium Fluoride (PREVIDENT 5000 BOOSTER) 1.1 % PSTE   dental   Place 1 application onto teeth 2 (two) times daily.         Marland Kitchen tiZANidine (ZANAFLEX) 4 MG tablet   Oral   Take 4 mg by mouth every 8 (eight) hours as needed for muscle spasms.         . traZODone (DESYREL) 100 MG tablet   Oral   Take 100 mg by mouth at bedtime.          Marland Kitchen venlafaxine (EFFEXOR) 75 MG tablet   Oral   Take 75 mg by mouth daily.         . naproxen (NAPROSYN) 500 MG tablet   Oral   Take 1 tablet (500 mg total) by mouth 2 (two) times daily with a meal.   30 tablet   0    BP 108/67  Pulse 91  Temp(Src) 98.7 F (37.1 C) (Oral)  Resp 16  SpO2 98% Physical Exam  Nursing note and vitals reviewed. Constitutional: She appears well-developed and well-nourished. No distress.  HENT:  Head: Normocephalic and atraumatic.  Eyes: Conjunctivae are normal. No scleral icterus.  Cardiovascular: Intact distal pulses.   Pulmonary/Chest: Effort normal.  Musculoskeletal: She exhibits tenderness ( Swelling and tenderness to the right ankle over the bilateral malleolus and across the dorsum of the foot at the ankle.). She exhibits no edema.  No tenderness over the base of the fifth metatarsal, decreased range of motion of the right ankle, no pain over the head of the fibula  Neurological: She is alert.  Skin: Skin is warm and dry. No rash noted. She is not diaphoretic.    ED Course  Procedures (including critical care time) Labs Review Labs Reviewed - No data to display Imaging Review Dg Ankle Complete Right  06/23/2013   CLINICAL DATA:  Injury, right ankle pain.  EXAM: RIGHT ANKLE - COMPLETE 3+ VIEW  COMPARISON:  Plain films right ankle 09/12/2011.  FINDINGS: There is some soft tissue swelling about the lateral aspect of the ankle. Well corticated bony fragments are  consistent with old fracture as seen on the comparison examination. There is no acute fracture.  Plantar calcaneal spur is noted. There is some midfoot degenerative disease.  IMPRESSION: Lateral soft tissue swelling without acute fracture. Old lateral malleolar fracture is identified.  Plantar calcaneal spur and midfoot degenerative change.   Electronically Signed   By: Drusilla Kannerhomas  Dalessio M.D.   On: 06/23/2013 03:34      MDM   Final diagnoses:  Sprain of right ankle    The patient has a exam which is concerning for possible fracture, possible sprain, ice, elevation, imaging. Toradol ordered for pain, patient states it has helped in the past. She has fractured this same ankle in the past according to her report.  I personally reviewed the complete right ankle x-rays, there is no signs of acute fracture, there is evidence of an old distal lateral malleolus fracture which was seen on prior x-rays as any bulging fracture. The patient will be informed of these findings, a brace, crutches, rice therapy, home with anti-inflammatories.   Meds given in ED:  Medications  ketorolac (TORADOL) injection 60 mg (60 mg Intramuscular Given 06/23/13 0320)    New Prescriptions   NAPROXEN (NAPROSYN) 500 MG TABLET    Take 1 tablet (500 mg total) by mouth 2 (two) times daily with a meal.      Vida RollerBrian D Aman Batley, MD 06/23/13 [phone]

## 2014-01-19 ENCOUNTER — Other Ambulatory Visit (INDEPENDENT_AMBULATORY_CARE_PROVIDER_SITE_OTHER): Payer: Self-pay

## 2014-01-19 DIAGNOSIS — R109 Unspecified abdominal pain: Secondary | ICD-10-CM

## 2014-01-19 DIAGNOSIS — K469 Unspecified abdominal hernia without obstruction or gangrene: Secondary | ICD-10-CM

## 2014-01-19 DIAGNOSIS — R1084 Generalized abdominal pain: Secondary | ICD-10-CM

## 2014-01-24 ENCOUNTER — Other Ambulatory Visit: Payer: Medicare Other

## 2014-02-01 ENCOUNTER — Ambulatory Visit
Admission: RE | Admit: 2014-02-01 | Discharge: 2014-02-01 | Disposition: A | Discharge status: Home / Self Care | Payer: PRIVATE HEALTH INSURANCE | Source: Ambulatory Visit | Attending: General Surgery | Admitting: General Surgery

## 2014-02-01 MED ORDER — IOHEXOL 300 MG/ML  SOLN
150.0000 mL | Freq: Once | INTRAMUSCULAR | Status: AC | PRN
  Administered 2014-02-01: 150 mL via INTRAVENOUS

## 2014-03-07 ENCOUNTER — Telehealth (INDEPENDENT_AMBULATORY_CARE_PROVIDER_SITE_OTHER): Payer: Self-pay

## 2014-03-07 NOTE — Telephone Encounter (Signed)
Called and left message fr patient to call our office re:  CT results and follow up appointment date & time for 03/29/14 @ 10:50 am w/Dr. Daphine DeutscherMartin.  Dr. Daphine DeutscherMartin has reviewed patients CT and advised for patient to follow up with office appointment.  Patient needs to be advised she needs to stop smoking if she hasn't already.

## 2015-03-20 ENCOUNTER — Emergency Department (HOSPITAL_COMMUNITY): Payer: Medicare Other

## 2015-03-20 ENCOUNTER — Encounter (HOSPITAL_COMMUNITY): Payer: Self-pay | Admitting: Emergency Medicine

## 2015-03-20 ENCOUNTER — Emergency Department (HOSPITAL_COMMUNITY)
Admission: EM | Admit: 2015-03-20 | Discharge: 2015-03-20 | Disposition: A | Discharge status: Home / Self Care | Payer: Medicare Other | Attending: Emergency Medicine | Admitting: Emergency Medicine

## 2015-03-20 DIAGNOSIS — T23072A Burn of unspecified degree of left wrist, initial encounter: Secondary | ICD-10-CM | POA: Diagnosis present

## 2015-03-20 DIAGNOSIS — Z791 Long term (current) use of non-steroidal anti-inflammatories (NSAID): Secondary | ICD-10-CM | POA: Diagnosis not present

## 2015-03-20 DIAGNOSIS — S0990XA Unspecified injury of head, initial encounter: Secondary | ICD-10-CM | POA: Diagnosis not present

## 2015-03-20 DIAGNOSIS — Z880 Allergy status to penicillin: Secondary | ICD-10-CM | POA: Insufficient documentation

## 2015-03-20 DIAGNOSIS — S6991XA Unspecified injury of right wrist, hand and finger(s), initial encounter: Secondary | ICD-10-CM | POA: Insufficient documentation

## 2015-03-20 DIAGNOSIS — Y9389 Activity, other specified: Secondary | ICD-10-CM | POA: Insufficient documentation

## 2015-03-20 DIAGNOSIS — T148 Other injury of unspecified body region: Secondary | ICD-10-CM | POA: Insufficient documentation

## 2015-03-20 DIAGNOSIS — Y998 Other external cause status: Secondary | ICD-10-CM | POA: Insufficient documentation

## 2015-03-20 DIAGNOSIS — F17210 Nicotine dependence, cigarettes, uncomplicated: Secondary | ICD-10-CM | POA: Insufficient documentation

## 2015-03-20 DIAGNOSIS — Z79899 Other long term (current) drug therapy: Secondary | ICD-10-CM | POA: Diagnosis not present

## 2015-03-20 DIAGNOSIS — S8991XA Unspecified injury of right lower leg, initial encounter: Secondary | ICD-10-CM | POA: Insufficient documentation

## 2015-03-20 DIAGNOSIS — T23272A Burn of second degree of left wrist, initial encounter: Secondary | ICD-10-CM | POA: Insufficient documentation

## 2015-03-20 DIAGNOSIS — M1990 Unspecified osteoarthritis, unspecified site: Secondary | ICD-10-CM | POA: Diagnosis not present

## 2015-03-20 DIAGNOSIS — Z8742 Personal history of other diseases of the female genital tract: Secondary | ICD-10-CM | POA: Diagnosis not present

## 2015-03-20 DIAGNOSIS — S8992XA Unspecified injury of left lower leg, initial encounter: Secondary | ICD-10-CM | POA: Insufficient documentation

## 2015-03-20 DIAGNOSIS — S1081XA Abrasion of other specified part of neck, initial encounter: Secondary | ICD-10-CM | POA: Diagnosis not present

## 2015-03-20 DIAGNOSIS — Y92410 Unspecified street and highway as the place of occurrence of the external cause: Secondary | ICD-10-CM | POA: Diagnosis not present

## 2015-03-20 DIAGNOSIS — R52 Pain, unspecified: Secondary | ICD-10-CM

## 2015-03-20 DIAGNOSIS — I10 Essential (primary) hypertension: Secondary | ICD-10-CM | POA: Insufficient documentation

## 2015-03-20 DIAGNOSIS — J45909 Unspecified asthma, uncomplicated: Secondary | ICD-10-CM | POA: Diagnosis not present

## 2015-03-20 DIAGNOSIS — T07XXXA Unspecified multiple injuries, initial encounter: Secondary | ICD-10-CM

## 2015-03-20 DIAGNOSIS — T23202A Burn of second degree of left hand, unspecified site, initial encounter: Secondary | ICD-10-CM

## 2015-03-20 DIAGNOSIS — S3992XA Unspecified injury of lower back, initial encounter: Secondary | ICD-10-CM | POA: Insufficient documentation

## 2015-03-20 DIAGNOSIS — Z8719 Personal history of other diseases of the digestive system: Secondary | ICD-10-CM | POA: Diagnosis not present

## 2015-03-20 LAB — CBC WITH DIFFERENTIAL/PLATELET
Basophils Absolute: 0 10*3/uL (ref 0.0–0.1)
Basophils Relative: 0 %
Eosinophils Absolute: 0.2 10*3/uL (ref 0.0–0.7)
Eosinophils Relative: 2 %
HEMATOCRIT: 45 % (ref 36.0–46.0)
HEMOGLOBIN: 15.2 g/dL — AB (ref 12.0–15.0)
LYMPHS ABS: 3.6 10*3/uL (ref 0.7–4.0)
Lymphocytes Relative: 33 %
MCH: 33.2 pg (ref 26.0–34.0)
MCHC: 33.8 g/dL (ref 30.0–36.0)
MCV: 98.3 fL (ref 78.0–100.0)
MONOS PCT: 7 %
Monocytes Absolute: 0.8 10*3/uL (ref 0.1–1.0)
NEUTROS PCT: 58 %
Neutro Abs: 6.2 10*3/uL (ref 1.7–7.7)
Platelets: 194 10*3/uL (ref 150–400)
RBC: 4.58 MIL/uL (ref 3.87–5.11)
RDW: 13 % (ref 11.5–15.5)
WBC: 10.8 10*3/uL — ABNORMAL HIGH (ref 4.0–10.5)

## 2015-03-20 LAB — I-STAT CHEM 8, ED
BUN: 12 mg/dL (ref 6–20)
CALCIUM ION: 1.07 mmol/L — AB (ref 1.12–1.23)
CHLORIDE: 103 mmol/L (ref 101–111)
CREATININE: 0.9 mg/dL (ref 0.44–1.00)
Glucose, Bld: 96 mg/dL (ref 65–99)
HEMATOCRIT: 47 % — AB (ref 36.0–46.0)
Hemoglobin: 16 g/dL — ABNORMAL HIGH (ref 12.0–15.0)
Potassium: 3.6 mmol/L (ref 3.5–5.1)
Sodium: 142 mmol/L (ref 135–145)
TCO2: 25 mmol/L (ref 0–100)

## 2015-03-20 LAB — I-STAT BETA HCG BLOOD, ED (MC, WL, AP ONLY): I-stat hCG, quantitative: <5 m[IU]/mL (ref <5)

## 2015-03-20 MED ORDER — SILVER SULFADIAZINE 1 % EX CREA: 1.0000 "application " | TOPICAL_CREAM | Freq: Every day | CUTANEOUS | Status: DC

## 2015-03-20 MED ORDER — SILVER SULFADIAZINE 1 % EX CREA
TOPICAL_CREAM | Freq: Once | CUTANEOUS | Status: AC
  Administered 2015-03-20: 1 via TOPICAL
  Filled 2015-03-20: qty 50

## 2015-03-20 MED ORDER — HYDROCODONE-ACETAMINOPHEN 5-325 MG PO TABS: 2.0000 | ORAL_TABLET | ORAL | Status: DC | PRN

## 2015-03-20 MED ORDER — FENTANYL CITRATE (PF) 100 MCG/2ML IJ SOLN
100.0000 ug | Freq: Once | INTRAMUSCULAR | Status: AC
  Administered 2015-03-20: 100 ug via INTRAVENOUS
  Filled 2015-03-20: qty 2

## 2015-03-20 MED ORDER — IBUPROFEN 800 MG PO TABS: 800.0000 mg | ORAL_TABLET | Freq: Three times a day (TID) | ORAL | Status: DC

## 2015-03-20 MED ORDER — HYDROCODONE-ACETAMINOPHEN 5-325 MG PO TABS
2.0000 | ORAL_TABLET | Freq: Once | ORAL | Status: AC
  Administered 2015-03-20: 2 via ORAL
  Filled 2015-03-20: qty 2

## 2015-03-20 MED ORDER — IOHEXOL 300 MG/ML  SOLN
90.0000 mL | Freq: Once | INTRAMUSCULAR | Status: AC | PRN
  Administered 2015-03-20: 100 mL via INTRAVENOUS

## 2015-03-20 MED ORDER — CYCLOBENZAPRINE HCL 10 MG PO TABS
10.0000 mg | ORAL_TABLET | Freq: Two times a day (BID) | ORAL | Status: DC | PRN
Start: 2015-03-20 — End: 2017-03-24

## 2015-03-20 MED ORDER — IOHEXOL 350 MG/ML SOLN
60.0000 mL | Freq: Once | INTRAVENOUS | Status: AC | PRN
  Administered 2015-03-20: 60 mL via INTRAVENOUS
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
## 2015-03-20 NOTE — Discharge Instructions (Signed)
xrays show no signs of fractures  May take 2 weeks to totally heal up  Please obtain all of your results from medical records or have your doctors office obtain the results - share them with your doctor - you should be seen at your doctors office in the next 2 days. Call today to arrange your follow up. Take the medications as prescribed. Please review all of the medicines and only take them if you do not have an allergy to them. Please be aware that if you are taking birth control pills, taking other prescriptions, ESPECIALLY ANTIBIOTICS may make the birth control ineffective - if this is the case, either do not engage in sexual activity or use alternative methods of birth control such as condoms until you have finished the medicine and your family doctor says it is OK to restart them. If you are on a blood thinner such as COUMADIN, be aware that any other medicine that you take may cause the coumadin to either work too much, or not enough - you should have your coumadin level rechecked in next 7 days if this is the case.  ?  It is also a possibility that you have an allergic reaction to any of the medicines that you have been prescribed - Everybody reacts differently to medications and while MOST people have no trouble with most medicines, you may have a reaction such as nausea, vomiting, rash, swelling, shortness of breath. If this is the case, please stop taking the medicine immediately and contact your physician.  ?  You should return to the ER if you develop severe or worsening symptoms.

## 2015-03-20 NOTE — ED Notes (Signed)
Cleaned wound to left hand applied silverdine and dressing, vital signs stable, patient wheeled to waiting vehicle, no distress noted.
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
## 2015-11-01 ENCOUNTER — Other Ambulatory Visit: Payer: Self-pay | Admitting: Surgery

## 2015-11-01 DIAGNOSIS — K439 Ventral hernia without obstruction or gangrene: Secondary | ICD-10-CM

## 2015-11-08 ENCOUNTER — Ambulatory Visit
Admission: RE | Admit: 2015-11-08 | Discharge: 2015-11-08 | Disposition: A | Discharge status: Home / Self Care | Payer: Medicare Other | Source: Ambulatory Visit | Attending: Surgery | Admitting: Surgery

## 2015-11-08 DIAGNOSIS — K439 Ventral hernia without obstruction or gangrene: Secondary | ICD-10-CM

## 2015-11-08 MED ORDER — IOPAMIDOL (ISOVUE-300) INJECTION 61%
125.0000 mL | Freq: Once | INTRAVENOUS | Status: AC | PRN
  Administered 2015-11-08: 125 mL via INTRAVENOUS

## 2015-11-16 ENCOUNTER — Other Ambulatory Visit: Payer: Self-pay | Admitting: Surgery

## 2015-12-19 ENCOUNTER — Encounter (HOSPITAL_BASED_OUTPATIENT_CLINIC_OR_DEPARTMENT_OTHER): Payer: Self-pay | Admitting: *Deleted

## 2015-12-19 DIAGNOSIS — K219 Gastro-esophageal reflux disease without esophagitis: Secondary | ICD-10-CM

## 2015-12-19 HISTORY — DX: Gastro-esophageal reflux disease without esophagitis: K21.9

## 2015-12-24 ENCOUNTER — Other Ambulatory Visit: Payer: Self-pay

## 2015-12-24 ENCOUNTER — Encounter (HOSPITAL_BASED_OUTPATIENT_CLINIC_OR_DEPARTMENT_OTHER)
Admission: RE | Admit: 2015-12-24 | Discharge: 2015-12-24 | Disposition: A | Discharge status: Home / Self Care | Payer: Medicare Other | Source: Ambulatory Visit | Attending: Plastic Surgery | Admitting: Plastic Surgery

## 2015-12-24 ENCOUNTER — Ambulatory Visit: Payer: Self-pay | Admitting: Plastic Surgery

## 2015-12-24 DIAGNOSIS — N62 Hypertrophy of breast: Secondary | ICD-10-CM

## 2015-12-24 DIAGNOSIS — I10 Essential (primary) hypertension: Secondary | ICD-10-CM | POA: Diagnosis not present

## 2015-12-24 LAB — BASIC METABOLIC PANEL
ANION GAP: 9 (ref 5–15)
BUN: 13 mg/dL (ref 6–20)
CO2: 27 mmol/L (ref 22–32)
Calcium: 9.6 mg/dL (ref 8.9–10.3)
Chloride: 100 mmol/L — ABNORMAL LOW (ref 101–111)
Creatinine, Ser: 0.95 mg/dL (ref 0.44–1.00)
GFR calc Af Amer: >60 mL/min (ref >60)
GLUCOSE: 110 mg/dL — AB (ref 65–99)
POTASSIUM: 4.4 mmol/L (ref 3.5–5.1)
Sodium: 136 mmol/L (ref 135–145)

## 2015-12-25 ENCOUNTER — Other Ambulatory Visit: Payer: Self-pay | Admitting: Physician Assistant

## 2015-12-25 DIAGNOSIS — F17200 Nicotine dependence, unspecified, uncomplicated: Secondary | ICD-10-CM | POA: Insufficient documentation

## 2015-12-25 DIAGNOSIS — F17210 Nicotine dependence, cigarettes, uncomplicated: Secondary | ICD-10-CM

## 2016-01-02 ENCOUNTER — Encounter (HOSPITAL_BASED_OUTPATIENT_CLINIC_OR_DEPARTMENT_OTHER): Payer: Self-pay | Admitting: *Deleted

## 2016-01-06 ENCOUNTER — Ambulatory Visit: Payer: Self-pay | Admitting: Plastic Surgery

## 2016-01-09 ENCOUNTER — Ambulatory Visit (HOSPITAL_BASED_OUTPATIENT_CLINIC_OR_DEPARTMENT_OTHER): Payer: Medicare Other | Admitting: Certified Registered"

## 2016-01-09 ENCOUNTER — Encounter (HOSPITAL_BASED_OUTPATIENT_CLINIC_OR_DEPARTMENT_OTHER)
Admission: RE | Disposition: A | Discharge status: Home / Self Care | Payer: Self-pay | Source: Ambulatory Visit | Attending: Plastic Surgery

## 2016-01-09 ENCOUNTER — Ambulatory Visit (HOSPITAL_BASED_OUTPATIENT_CLINIC_OR_DEPARTMENT_OTHER)
Admission: RE | Admit: 2016-01-09 | Discharge: 2016-01-10 | Disposition: A | Discharge status: Home / Self Care | Payer: Medicare Other | Source: Ambulatory Visit | Attending: Plastic Surgery | Admitting: Plastic Surgery

## 2016-01-09 ENCOUNTER — Encounter (HOSPITAL_BASED_OUTPATIENT_CLINIC_OR_DEPARTMENT_OTHER): Payer: Self-pay | Admitting: *Deleted

## 2016-01-09 DIAGNOSIS — M542 Cervicalgia: Secondary | ICD-10-CM | POA: Insufficient documentation

## 2016-01-09 DIAGNOSIS — N62 Hypertrophy of breast: Secondary | ICD-10-CM | POA: Diagnosis present

## 2016-01-09 DIAGNOSIS — Z880 Allergy status to penicillin: Secondary | ICD-10-CM | POA: Insufficient documentation

## 2016-01-09 DIAGNOSIS — Z6841 Body Mass Index (BMI) 40.0 and over, adult: Secondary | ICD-10-CM | POA: Insufficient documentation

## 2016-01-09 DIAGNOSIS — K219 Gastro-esophageal reflux disease without esophagitis: Secondary | ICD-10-CM | POA: Insufficient documentation

## 2016-01-09 DIAGNOSIS — I10 Essential (primary) hypertension: Secondary | ICD-10-CM | POA: Diagnosis not present

## 2016-01-09 DIAGNOSIS — Z87891 Personal history of nicotine dependence: Secondary | ICD-10-CM | POA: Diagnosis not present

## 2016-01-09 DIAGNOSIS — Z887 Allergy status to serum and vaccine status: Secondary | ICD-10-CM | POA: Diagnosis not present

## 2016-01-09 HISTORY — DX: Other specified postprocedural states: Z98.890

## 2016-01-09 HISTORY — DX: Other specified postprocedural states: R11.2

## 2016-01-09 HISTORY — PX: BREAST REDUCTION SURGERY: SHX8

## 2016-01-09 HISTORY — DX: Anxiety disorder, unspecified: F41.9

## 2016-01-09 SURGERY — MAMMOPLASTY, REDUCTION
Anesthesia: General | Site: Breast | Laterality: Bilateral

## 2016-01-09 MED ORDER — PHENYLEPHRINE 40 MCG/ML (10ML) SYRINGE FOR IV PUSH (FOR BLOOD PRESSURE SUPPORT)
PREFILLED_SYRINGE | INTRAVENOUS | Status: AC
  Filled 2016-01-09: qty 10

## 2016-01-09 MED ORDER — DEXAMETHASONE SODIUM PHOSPHATE 10 MG/ML IJ SOLN
INTRAMUSCULAR | Status: AC
  Filled 2016-01-09: qty 1

## 2016-01-09 MED ORDER — FENTANYL CITRATE (PF) 100 MCG/2ML IJ SOLN
INTRAMUSCULAR | Status: AC
Start: 2016-01-09 — End: 2016-01-09
  Filled 2016-01-09: qty 2

## 2016-01-09 MED ORDER — HYDROMORPHONE HCL 1 MG/ML IJ SOLN
0.2500 mg | INTRAMUSCULAR | Status: DC | PRN
  Administered 2016-01-09 (×3): 0.5 mg via INTRAVENOUS

## 2016-01-09 MED ORDER — ALBUTEROL SULFATE HFA 108 (90 BASE) MCG/ACT IN AERS: 2.0000 | INHALATION_SPRAY | Freq: Four times a day (QID) | RESPIRATORY_TRACT | Status: DC | PRN

## 2016-01-09 MED ORDER — ONDANSETRON HCL 4 MG/2ML IJ SOLN: 4.0000 mg | Freq: Four times a day (QID) | INTRAMUSCULAR | Status: DC | PRN

## 2016-01-09 MED ORDER — CIPROFLOXACIN IN D5W 400 MG/200ML IV SOLN
400.0000 mg | Freq: Two times a day (BID) | INTRAVENOUS | Status: DC
  Administered 2016-01-09: 400 mg via INTRAVENOUS
  Filled 2016-01-09: qty 200

## 2016-01-09 MED ORDER — MIDAZOLAM HCL 2 MG/2ML IJ SOLN
INTRAMUSCULAR | Status: AC
  Filled 2016-01-09: qty 2

## 2016-01-09 MED ORDER — BUPIVACAINE-EPINEPHRINE (PF) 0.25% -1:200000 IJ SOLN
INTRAMUSCULAR | Status: AC
  Filled 2016-01-09: qty 30

## 2016-01-09 MED ORDER — ONDANSETRON 4 MG PO TBDP
4.0000 mg | ORAL_TABLET | Freq: Four times a day (QID) | ORAL | Status: DC | PRN
  Administered 2016-01-10: 4 mg via ORAL
  Filled 2016-01-09: qty 1

## 2016-01-09 MED ORDER — SODIUM CHLORIDE 0.9 % IJ SOLN
INTRAMUSCULAR | Status: AC
  Filled 2016-01-09: qty 10

## 2016-01-09 MED ORDER — ONDANSETRON HCL 4 MG/2ML IJ SOLN
4.0000 mg | Freq: Once | INTRAMUSCULAR | Status: DC | PRN
Start: 2016-01-09 — End: 2016-01-09

## 2016-01-09 MED ORDER — CHLORHEXIDINE GLUCONATE CLOTH 2 % EX PADS: 6.0000 | MEDICATED_PAD | Freq: Once | CUTANEOUS | Status: DC

## 2016-01-09 MED ORDER — PROPOFOL 500 MG/50ML IV EMUL
INTRAVENOUS | Status: AC
  Filled 2016-01-09: qty 50

## 2016-01-09 MED ORDER — DEXAMETHASONE SODIUM PHOSPHATE 4 MG/ML IJ SOLN
INTRAMUSCULAR | Status: DC | PRN
  Administered 2016-01-09: 10 mg via INTRAVENOUS

## 2016-01-09 MED ORDER — ACETAMINOPHEN 500 MG PO TABS: 1000.0000 mg | ORAL_TABLET | Freq: Four times a day (QID) | ORAL | Status: DC | PRN

## 2016-01-09 MED ORDER — NAPROXEN 500 MG PO TABS
500.0000 mg | ORAL_TABLET | Freq: Two times a day (BID) | ORAL | Status: DC | PRN
  Administered 2016-01-09: 500 mg via ORAL
  Filled 2016-01-09: qty 2

## 2016-01-09 MED ORDER — SUCCINYLCHOLINE CHLORIDE 20 MG/ML IJ SOLN
INTRAMUSCULAR | Status: DC | PRN
  Administered 2016-01-09: 100 mg via INTRAVENOUS

## 2016-01-09 MED ORDER — HYDROMORPHONE HCL 1 MG/ML IJ SOLN
INTRAMUSCULAR | Status: AC
  Filled 2016-01-09: qty 1

## 2016-01-09 MED ORDER — NITROGLYCERIN 2 % TD OINT
TOPICAL_OINTMENT | TRANSDERMAL | Status: DC | PRN
  Administered 2016-01-09: 1 [in_us] via TOPICAL

## 2016-01-09 MED ORDER — PROMETHAZINE HCL 25 MG/ML IJ SOLN
INTRAMUSCULAR | Status: AC
  Filled 2016-01-09: qty 1

## 2016-01-09 MED ORDER — PHENYLEPHRINE HCL 10 MG/ML IJ SOLN
INTRAMUSCULAR | Status: DC | PRN
  Administered 2016-01-09: 40 ug via INTRAVENOUS
  Administered 2016-01-09: 80 ug via INTRAVENOUS

## 2016-01-09 MED ORDER — NITROGLYCERIN 2 % TD OINT
1.0000 [in_us] | TOPICAL_OINTMENT | Freq: Four times a day (QID) | TRANSDERMAL | Status: DC
  Administered 2016-01-09 – 2016-01-10 (×2): 1 [in_us] via TOPICAL

## 2016-01-09 MED ORDER — KCL IN DEXTROSE-NACL 20-5-0.45 MEQ/L-%-% IV SOLN
INTRAVENOUS | Status: DC
  Administered 2016-01-09 – 2016-01-10 (×2): via INTRAVENOUS
  Filled 2016-01-09 (×2): qty 1000

## 2016-01-09 MED ORDER — AZELASTINE HCL 0.1 % NA SOLN: 2.0000 | Freq: Two times a day (BID) | NASAL | Status: DC | PRN

## 2016-01-09 MED ORDER — GLYCOPYRROLATE 0.2 MG/ML IJ SOLN: 0.2000 mg | Freq: Once | INTRAMUSCULAR | Status: DC | PRN

## 2016-01-09 MED ORDER — ONDANSETRON HCL 4 MG/2ML IJ SOLN
INTRAMUSCULAR | Status: DC | PRN
  Administered 2016-01-09: 4 mg via INTRAVENOUS

## 2016-01-09 MED ORDER — DIPHENHYDRAMINE HCL 12.5 MG/5ML PO ELIX: 12.5000 mg | ORAL_SOLUTION | Freq: Four times a day (QID) | ORAL | Status: DC | PRN

## 2016-01-09 MED ORDER — FENTANYL CITRATE (PF) 100 MCG/2ML IJ SOLN
INTRAMUSCULAR | Status: AC
  Filled 2016-01-09: qty 2

## 2016-01-09 MED ORDER — SCOPOLAMINE 1 MG/3DAYS TD PT72
1.0000 | MEDICATED_PATCH | Freq: Once | TRANSDERMAL | Status: AC | PRN
  Administered 2016-01-09: 1 via TRANSDERMAL

## 2016-01-09 MED ORDER — LIDOCAINE HCL (PF) 1 % IJ SOLN
INTRAMUSCULAR | Status: AC
  Filled 2016-01-09: qty 30

## 2016-01-09 MED ORDER — EPINEPHRINE HCL 1 MG/ML IJ SOLN
INTRAMUSCULAR | Status: AC
  Filled 2016-01-09: qty 1

## 2016-01-09 MED ORDER — BUPIVACAINE-EPINEPHRINE 0.25% -1:200000 IJ SOLN
INTRAMUSCULAR | Status: DC | PRN
  Administered 2016-01-09: 20 mL

## 2016-01-09 MED ORDER — PROMETHAZINE HCL 25 MG/ML IJ SOLN
6.2500 mg | Freq: Once | INTRAMUSCULAR | Status: AC
  Administered 2016-01-09: 6.25 mg via INTRAVENOUS

## 2016-01-09 MED ORDER — LIDOCAINE 2% (20 MG/ML) 5 ML SYRINGE
INTRAMUSCULAR | Status: AC
  Filled 2016-01-09: qty 5

## 2016-01-09 MED ORDER — CIPROFLOXACIN IN D5W 400 MG/200ML IV SOLN
INTRAVENOUS | Status: AC
  Filled 2016-01-09: qty 200

## 2016-01-09 MED ORDER — VENLAFAXINE HCL 75 MG PO TABS
75.0000 mg | ORAL_TABLET | Freq: Every day | ORAL | Status: DC
  Administered 2016-01-09: 75 mg via ORAL

## 2016-01-09 MED ORDER — LACTATED RINGERS IV SOLN
INTRAVENOUS | Status: DC
  Administered 2016-01-09: 15:00:00 via INTRAVENOUS
  Administered 2016-01-09: 10 mL/h via INTRAVENOUS
  Administered 2016-01-09: 12:00:00 via INTRAVENOUS

## 2016-01-09 MED ORDER — SENNA 8.6 MG PO TABS: 1.0000 | ORAL_TABLET | Freq: Two times a day (BID) | ORAL | Status: DC

## 2016-01-09 MED ORDER — OXYCODONE HCL 5 MG PO TABS: 5.0000 mg | ORAL_TABLET | Freq: Once | ORAL | Status: DC | PRN

## 2016-01-09 MED ORDER — MEPERIDINE HCL 25 MG/ML IJ SOLN: 6.2500 mg | INTRAMUSCULAR | Status: DC | PRN

## 2016-01-09 MED ORDER — ALUM & MAG HYDROXIDE-SIMETH 200-200-20 MG/5ML PO SUSP
15.0000 mL | Freq: Three times a day (TID) | ORAL | Status: DC | PRN
  Administered 2016-01-09: 15 mL via ORAL
  Filled 2016-01-09: qty 30

## 2016-01-09 MED ORDER — LACTATED RINGERS IV SOLN
Freq: Once | INTRAVENOUS | Status: AC
  Administered 2016-01-09: 17:00:00 via INTRAVENOUS

## 2016-01-09 MED ORDER — ONDANSETRON HCL 4 MG/2ML IJ SOLN
INTRAMUSCULAR | Status: AC
  Filled 2016-01-09: qty 2

## 2016-01-09 MED ORDER — HYDROMORPHONE HCL 1 MG/ML IJ SOLN
1.0000 mg | INTRAMUSCULAR | Status: DC | PRN
  Administered 2016-01-09: 1 mg via INTRAVENOUS
  Filled 2016-01-09: qty 1

## 2016-01-09 MED ORDER — SUCCINYLCHOLINE CHLORIDE 200 MG/10ML IV SOSY
PREFILLED_SYRINGE | INTRAVENOUS | Status: AC
  Filled 2016-01-09: qty 10

## 2016-01-09 MED ORDER — DIPHENHYDRAMINE HCL 50 MG/ML IJ SOLN: 12.5000 mg | Freq: Four times a day (QID) | INTRAMUSCULAR | Status: DC | PRN

## 2016-01-09 MED ORDER — MIDAZOLAM HCL 2 MG/2ML IJ SOLN
1.0000 mg | INTRAMUSCULAR | Status: DC | PRN
  Administered 2016-01-09: 2 mg via INTRAVENOUS

## 2016-01-09 MED ORDER — OXYCODONE HCL 5 MG/5ML PO SOLN: 5.0000 mg | Freq: Once | ORAL | Status: DC | PRN

## 2016-01-09 MED ORDER — BUPIVACAINE-EPINEPHRINE (PF) 0.25% -1:200000 IJ SOLN
INTRAMUSCULAR | Status: AC
  Filled 2016-01-09: qty 60

## 2016-01-09 MED ORDER — LIDOCAINE HCL (CARDIAC) 20 MG/ML IV SOLN
INTRAVENOUS | Status: DC | PRN
  Administered 2016-01-09: 60 mg via INTRAVENOUS

## 2016-01-09 MED ORDER — DIAZEPAM 2 MG PO TABS: 2.0000 mg | ORAL_TABLET | Freq: Two times a day (BID) | ORAL | Status: DC | PRN

## 2016-01-09 MED ORDER — ESMOLOL HCL 100 MG/10ML IV SOLN
INTRAVENOUS | Status: AC
  Filled 2016-01-09: qty 10

## 2016-01-09 MED ORDER — ALPRAZOLAM 0.5 MG PO TABS
0.5000 mg | ORAL_TABLET | Freq: Three times a day (TID) | ORAL | Status: DC | PRN
  Administered 2016-01-09: 0.5 mg via ORAL
  Filled 2016-01-09: qty 2

## 2016-01-09 MED ORDER — HYDROCODONE-ACETAMINOPHEN 5-325 MG PO TABS
1.0000 | ORAL_TABLET | ORAL | Status: DC | PRN
  Administered 2016-01-09 – 2016-01-10 (×3): 2 via ORAL
  Filled 2016-01-09 (×3): qty 2

## 2016-01-09 MED ORDER — CIPROFLOXACIN IN D5W 400 MG/200ML IV SOLN
400.0000 mg | INTRAVENOUS | Status: AC
  Administered 2016-01-09 (×2): 400 mg via INTRAVENOUS

## 2016-01-09 MED ORDER — PROPOFOL 10 MG/ML IV BOLUS
INTRAVENOUS | Status: DC | PRN
  Administered 2016-01-09: 200 mg via INTRAVENOUS

## 2016-01-09 MED ORDER — FENTANYL CITRATE (PF) 100 MCG/2ML IJ SOLN
50.0000 ug | INTRAMUSCULAR | Status: AC | PRN
  Administered 2016-01-09 (×2): 50 ug via INTRAVENOUS
  Administered 2016-01-09 (×2): 100 ug via INTRAVENOUS
  Administered 2016-01-09: 50 ug via INTRAVENOUS

## 2016-01-09 SURGICAL SUPPLY — 72 items
ADH SKN CLS APL DERMABOND .7 (GAUZE/BANDAGES/DRESSINGS) ×2
BAG DECANTER FOR FLEXI CONT (MISCELLANEOUS) ×2 IMPLANT
BINDER BREAST 3XL (BIND) ×1 IMPLANT
BINDER BREAST LRG (GAUZE/BANDAGES/DRESSINGS) ×1 IMPLANT
BINDER BREAST MEDIUM (GAUZE/BANDAGES/DRESSINGS) ×1 IMPLANT
BINDER BREAST XLRG (GAUZE/BANDAGES/DRESSINGS) ×1 IMPLANT
BINDER BREAST XXLRG (GAUZE/BANDAGES/DRESSINGS) IMPLANT
BIOPATCH RED 1 DISK 7.0 (GAUZE/BANDAGES/DRESSINGS) ×2 IMPLANT
BLADE HEX COATED 2.75 (ELECTRODE) ×2 IMPLANT
BLADE KNIFE PERSONA 10 (BLADE) ×10 IMPLANT
BLADE SURG 15 STRL LF DISP TIS (BLADE) IMPLANT
BLADE SURG 15 STRL SS (BLADE)
BNDG GAUZE ELAST 4 BULKY (GAUZE/BANDAGES/DRESSINGS) ×4 IMPLANT
CANISTER SUCT 1200ML W/VALVE (MISCELLANEOUS) ×2 IMPLANT
CATH ROBINSON RED A/P 14FR (CATHETERS) ×1 IMPLANT
CHLORAPREP W/TINT 26ML (MISCELLANEOUS) ×3 IMPLANT
COVER BACK TABLE 60X90IN (DRAPES) ×2 IMPLANT
COVER MAYO STAND STRL (DRAPES) ×2 IMPLANT
DECANTER SPIKE VIAL GLASS SM (MISCELLANEOUS) IMPLANT
DERMABOND ADVANCED (GAUZE/BANDAGES/DRESSINGS) ×2
DERMABOND ADVANCED .7 DNX12 (GAUZE/BANDAGES/DRESSINGS) IMPLANT
DRAIN CHANNEL 19F RND (DRAIN) ×2 IMPLANT
DRAPE LAPAROSCOPIC ABDOMINAL (DRAPES) ×2 IMPLANT
DRSG PAD ABDOMINAL 8X10 ST (GAUZE/BANDAGES/DRESSINGS) ×4 IMPLANT
ELECT BLADE 4.0 EZ CLEAN MEGAD (MISCELLANEOUS)
ELECT REM PT RETURN 9FT ADLT (ELECTROSURGICAL) ×2
ELECTRODE BLDE 4.0 EZ CLN MEGD (MISCELLANEOUS) IMPLANT
ELECTRODE REM PT RTRN 9FT ADLT (ELECTROSURGICAL) ×1 IMPLANT
EVACUATOR SILICONE 100CC (DRAIN) ×2 IMPLANT
FILTER LIPOSUCTION (MISCELLANEOUS) IMPLANT
GAUZE SPONGE 4X4 12PLY STRL (GAUZE/BANDAGES/DRESSINGS) ×2 IMPLANT
GLOVE BIO SURGEON STRL SZ 6.5 (GLOVE) ×4 IMPLANT
GLOVE BIOGEL PI IND STRL 7.0 (GLOVE) IMPLANT
GLOVE BIOGEL PI INDICATOR 7.0 (GLOVE) ×3
GLOVE ECLIPSE 6.5 STRL STRAW (GLOVE) ×2 IMPLANT
GOWN STRL REUS W/ TWL LRG LVL3 (GOWN DISPOSABLE) ×2 IMPLANT
GOWN STRL REUS W/TWL LRG LVL3 (GOWN DISPOSABLE) ×4
NDL HYPO 25X1 1.5 SAFETY (NEEDLE) ×1 IMPLANT
NDL SAFETY ECLIPSE 18X1.5 (NEEDLE) IMPLANT
NEEDLE HYPO 18GX1.5 SHARP (NEEDLE)
NEEDLE HYPO 25X1 1.5 SAFETY (NEEDLE) ×2 IMPLANT
NS IRRIG 1000ML POUR BTL (IV SOLUTION) ×2 IMPLANT
PACK BASIN DAY SURGERY FS (CUSTOM PROCEDURE TRAY) ×2 IMPLANT
PAD ALCOHOL SWAB (MISCELLANEOUS) IMPLANT
PENCIL BUTTON HOLSTER BLD 10FT (ELECTRODE) ×2 IMPLANT
SHEET MEDIUM DRAPE 40X70 STRL (DRAPES) ×1 IMPLANT
SLEEVE SCD COMPRESS KNEE MED (MISCELLANEOUS) ×2 IMPLANT
SPONGE LAP 18X18 X RAY DECT (DISPOSABLE) ×8 IMPLANT
STRIP SUTURE WOUND CLOSURE 1/2 (SUTURE) ×4 IMPLANT
SUT MNCRL AB 4-0 PS2 18 (SUTURE) ×10 IMPLANT
SUT MON AB 3-0 SH 27 (SUTURE) ×2
SUT MON AB 3-0 SH27 (SUTURE) ×1 IMPLANT
SUT MON AB 5-0 PS2 18 (SUTURE) ×11 IMPLANT
SUT PDS 3-0 CT2 (SUTURE)
SUT PDS AB 2-0 CT2 27 (SUTURE) IMPLANT
SUT PDS II 3-0 CT2 27 ABS (SUTURE) IMPLANT
SUT SILK 3 0 PS 1 (SUTURE) ×2 IMPLANT
SUT VIC AB 3-0 SH 27 (SUTURE) ×4
SUT VIC AB 3-0 SH 27X BRD (SUTURE) IMPLANT
SUT VICRYL 4-0 PS2 18IN ABS (SUTURE) IMPLANT
SYR 3ML 23GX1 SAFETY (SYRINGE) ×2 IMPLANT
SYR 50ML LL SCALE MARK (SYRINGE) IMPLANT
SYR BULB IRRIGATION 50ML (SYRINGE) ×2 IMPLANT
SYR CONTROL 10ML LL (SYRINGE) ×2 IMPLANT
TAPE MEASURE VINYL STERILE (MISCELLANEOUS) ×2 IMPLANT
TOWEL OR 17X24 6PK STRL BLUE (TOWEL DISPOSABLE) ×4 IMPLANT
TRAY DSU PREP LF (CUSTOM PROCEDURE TRAY) ×1 IMPLANT
TUBE CONNECTING 20X1/4 (TUBING) ×2 IMPLANT
TUBING INFILTRATION IT-10001 (TUBING) IMPLANT
TUBING SET GRADUATE ASPIR 12FT (MISCELLANEOUS) IMPLANT
UNDERPAD 30X30 (UNDERPADS AND DIAPERS) ×5 IMPLANT
YANKAUER SUCT BULB TIP NO VENT (SUCTIONS) ×2 IMPLANT

## 2016-01-09 NOTE — Op Note (Signed)
Breast Reduction Op note:    DATE OF PROCEDURE: 01/09/2016  [facility]  SURGEON: Alan Ripperlaire Sanger Reubin Bushnell, DO  ASSISTANT: Shawn Rayburn, PA  PREOPERATIVE DIAGNOSIS 1. Macromastia 2. Neck Pain 3. Back Pain  POSTOPERATIVE DIAGNOSIS 1. Macromastia 2. Neck Pain 3. Back Pain  PROCEDURES 1. Bilateral breast reduction.  Right reduction 1541g, Left reduction 1597g  COMPLICATIONS: None.  DRAINS: bilateral  INDICATIONS FOR PROCEDURE @FNAMEA @ [name] is a 45 y.o. year-old female born on Mar 23, 1971,with a history of symptomatic macromastia with concominant back pain, neck pain, shoulder grooving from her bra.   MRN: [id number]  CONSENT Informed consent was obtained directly from the patient. The risks, benefits and alternatives were fully discussed. Specific risks including but not limited to bleeding, infection, hematoma, seroma, scarring, pain, nipple necrosis, asymmetry, poor cosmetic results, and need for further surgery were discussed. The patient had ample opportunity to have her questions answered to her satisfaction.  DESCRIPTION OF PROCEDURE  Patient was brought into the operating room and placed in a supine position.  SCDs were placed and appropriate padding was performed.  Antibiotics were given. The patient underwent general anesthesia and the chest was prepped and draped in a sterile fashion.  A timeout was performed and all information was confirmed to be correct.  Right side: Preoperative markings were confirmed.  Incision lines were injected with 1% Xylocaine with epinephrine.  After waiting for vasoconstriction, the marked lines were incised.  A Wise-pattern superomedial breast reduction was performed by de-epithelializing the pedicle, using bovie to create the superomedial pedicle, and removing breast tissue from the superior, lateral, and inferior portions of the breast.  Care was taken to not undermine the breast pedicle. Hemostasis was  achieved.  The nipple was gently rotated into position and the soft tissue closed with 4-0 Monocryl.   The pocket was irrigated and hemostasis confirmed.  The deep tissues were approximated with 3-0 monocryl sutures and the skin was closed with deep dermal and subcuticular 4-0 Monocryl sutures.  The skin flaps had good capillary refill at the end of the procedure.    Left side: Preoperative markings were confirmed.  Incision lines were injected with 1% Xylocaine with epinephrine.  After waiting for vasoconstriction, the marked lines were incised.  A Wise-pattern superomedial breast reduction was performed by de-epithelializing the pedicle, using bovie to create the superomedial pedicle, and removing breast tissue from the superior, lateral, and inferior portions of the breast.  Care was taken to not undermine the breast pedicle. Hemostasis was achieved.  The nipple was gently rotated into position and the soft tissue was closed with 4-0 Monocryl.  The patient was sat upright and size and shape symmetry was confirmed.  The pocket was irrigated and hemostasis confirmed.  The deep tissues were approximated with 3-0 monocryl sutures and the skin was closed with deep dermal and subcuticular 4-0 Monocryl sutures.  Dermabond was applied.  A breast binder and ABDs were placed.  The skin flaps had good capillary refill at the end of the procedure. The nipple areola were slightly dusky but only at times so nitro paste was added.  Drains were placed on both sides and secured to the skin with 4-0 silk.  The patient tolerated the procedure well. The patient was allowed to wake from anesthesia and taken to the recovery room in satisfactory condition

## 2016-01-09 NOTE — Brief Op Note (Signed)
01/09/2016  3:38 PM  PATIENT:  [name]  45 y.o. female  PRE-OPERATIVE DIAGNOSIS:  breast hypertrophy  POST-OPERATIVE DIAGNOSIS:  breast hypertrophy  PROCEDURE:  Procedure(s): MAMMARY REDUCTION  (BREAST) BILATERAL (Bilateral)  SURGEON:  Surgeon(s) and Role:    * Alena Billslaire S Charise Leinbach, DO - Primary  PHYSICIAN ASSISTANT: Shawn Rayburn, JPA  ASSISTANTS: none   ANESTHESIA:   general  EBL:  Total I/O In: 2000 [I.V.:2000] Out: 225 [Urine:100; Blood:125]  BLOOD ADMINISTERED:none  DRAINS: (2) Jackson-Pratt drain(s) with closed bulb suction in the breast pocket   LOCAL MEDICATIONS USED:  MARCAINE     SPECIMEN:  Source of Specimen:  bilateral breast tissue  DISPOSITION OF SPECIMEN:  PATHOLOGY  COUNTS:  YES  TOURNIQUET:  * No tourniquets in log *  DICTATION: .Dragon Dictation  PLAN OF CARE: Admit for overnight observation  PATIENT DISPOSITION:  PACU - hemodynamically stable.   Delay start of Pharmacological VTE agent (>24hrs) due to surgical blood loss or risk of bleeding: no

## 2016-01-09 NOTE — Anesthesia Procedure Notes (Signed)
Procedure Name: Intubation Date/Time: 01/09/2016 11:00 AM Performed by: Toure Edmonds D Pre-anesthesia Checklist: Patient identified, Emergency Drugs available, Suction available and Patient being monitored Patient Re-evaluated:Patient Re-evaluated prior to inductionOxygen Delivery Method: Circle system utilized Preoxygenation: Pre-oxygenation with 100% oxygen Intubation Type: IV induction Ventilation: Mask ventilation without difficulty Laryngoscope Size: Mac and 3 Grade View: Grade I Tube type: Oral Tube size: 7.0 mm Number of attempts: 1 Airway Equipment and Method: Stylet and Oral airway Placement Confirmation: ETT inserted through vocal cords under direct vision,  positive ETCO2 and breath sounds checked- equal and bilateral Secured at: 21 cm Tube secured with: Tape Dental Injury: Teeth and Oropharynx as per pre-operative assessment

## 2016-01-09 NOTE — Anesthesia Preprocedure Evaluation (Signed)
Anesthesia Evaluation  Patient identified by MRN, date of birth, ID band Patient awake    Reviewed: Allergy & Precautions, NPO status , Patient's Chart, lab work & pertinent test results  History of Anesthesia Complications (+) PONV  Airway Mallampati: I  TM Distance: >3 FB Neck ROM: Full    Dental  (+) Teeth Intact, Dental Advisory Given   Pulmonary former smoker,    breath sounds clear to auscultation       Cardiovascular hypertension, Pt. on medications  Rhythm:Regular Rate:Normal     Neuro/Psych    GI/Hepatic GERD  ,  Endo/Other  Morbid obesity  Renal/GU      Musculoskeletal   Abdominal   Peds  Hematology   Anesthesia Other Findings   Reproductive/Obstetrics                             Anesthesia Physical Anesthesia Plan  ASA: III  Anesthesia Plan: General   Post-op Pain Management:    Induction: Intravenous  Airway Management Planned: Oral ETT  Additional Equipment:   Intra-op Plan:   Post-operative Plan: Extubation in OR  Informed Consent: I have reviewed the patients History and Physical, chart, labs and discussed the procedure including the risks, benefits and alternatives for the proposed anesthesia with the patient or authorized representative who has indicated his/her understanding and acceptance.   Dental advisory given  Plan Discussed with: CRNA, Anesthesiologist and Surgeon  Anesthesia Plan Comments:         Anesthesia Quick Evaluation

## 2016-01-09 NOTE — Anesthesia Postprocedure Evaluation (Signed)
Anesthesia Post Note  Patient: [name]  Procedure(s) Performed: Procedure(s) (LRB): MAMMARY REDUCTION  (BREAST) BILATERAL (Bilateral)  Patient location during evaluation: PACU Anesthesia Type: General Level of consciousness: awake and alert Pain management: pain level controlled Vital Signs Assessment: post-procedure vital signs reviewed and stable Respiratory status: spontaneous breathing, nonlabored ventilation, respiratory function stable and patient connected to nasal cannula oxygen Cardiovascular status: blood pressure returned to baseline and stable Postop Assessment: no signs of nausea or vomiting Anesthetic complications: no    Last Vitals:  Vitals:   01/09/16 1700 01/09/16 1715  BP: 118/87 (!) 139/119  Pulse: (!) 117 (!) 104  Resp: 10 (!) 31  Temp:      Last Pain:  Vitals:   01/09/16 1645  TempSrc:   PainSc: Asleep                 Breanna Mcdaniel A

## 2016-01-09 NOTE — Transfer of Care (Signed)
Immediate Anesthesia Transfer of Care Note  Patient: [name]  Procedure(s) Performed: Procedure(s): MAMMARY REDUCTION  (BREAST) BILATERAL (Bilateral)  Patient Location: PACU  Anesthesia Type:General  Level of Consciousness: awake, alert , oriented and patient cooperative  Airway & Oxygen Therapy: Patient Spontanous Breathing and Patient connected to face mask oxygen  Post-op Assessment: Report given to RN and Post -op Vital signs reviewed and stable  Post vital signs: Reviewed and stable  Last Vitals:  Vitals:   01/09/16 0944  BP: 118/81  Pulse: 95  Resp: 20  Temp: 36.8 C    Last Pain:  Vitals:   01/09/16 0944  TempSrc: Oral         Complications: No apparent anesthesia complications

## 2016-01-09 NOTE — H&P (Signed)
[name] is an 45 y.o. female.   Chief Complaint: mammary hypertrophy HPI: [name] is a 45 y.o. female here for a pre operative history and physical for bilateral breast reduction surgery. She has had a history of mammary hyperplasia for several years. She had been smoking but was able to quit and has been non smoking for the past several months.   She has extremely large breasts causing symptoms that include the following: Back pain (upper and lower) and neck pain. She frequently pins bra cups higher on straps for better lift and relief. Notices relief when holding breast up in her hands. Shoulder straps causing grooves, pain occasionally requiring padding. Pain medication is sometimes required with motrin and tylenol.  Her breasts are extremely large and fairly symmetric.  She has hyperpigmentation of the inframammary area on both sides.  The sternal to nipple distance on the right is 49 and the left is 50.  The IMF distance is 20 cm.  She is 5 feet 11 inches tall and weighs 295 pounds.  Preoperative bra size = 48G cup.  The estimated excess breast tissue to be removed at the time of surgery is 850 grams on the left and 850 grams on the right.  Past Medical History:  Diagnosis Date  . Anxiety   . Arthritis   . Asthma   . Depression   . Endometriosis t  . Gastric bypass status for obesity t  . GERD (gastroesophageal reflux disease) t  . Hernia, diaphragmatic t  . Hx of hysterectomy t  . Hypertension   . PONV (postoperative nausea and vomiting)   . S/P cholecystectomy t    Past Surgical History:  Procedure Laterality Date  . ABDOMINAL HYSTERECTOMY    . ABDOMINAL SURGERY    . CHOLECYSTECTOMY    . HERNIA REPAIR     hiatal hernia x3, abd hernia  . HIATAL HERNIA REPAIR     was done with lapband on two occasions.  [employer] mesh was used and removed  . lap band removal    . LAPAROSCOPIC GASTRIC BANDING  2005   was done with hiatus hernia repair.  redone in 2006   .  UMBILICAL HERNIA REPAIR  01/10/11   emergency hernia repair due to hernia twisted in intestines     Family History  Problem Relation Age of Onset  . Cancer Maternal Aunt     melanoma  . Diabetes Father   . Heart attack Father    Social History:  reports that she quit smoking about 7 months ago. Her smoking use included Cigarettes. She has never used smokeless tobacco. She reports that she does not drink alcohol or use drugs.  Allergies:  Allergies  Allergen Reactions  . Amoxicillin   . Influenza Vaccines     Medications Prior to Admission  Medication Sig Dispense Refill  . acetaminophen (TYLENOL) 500 MG tablet Take 1,000 mg by mouth every 6 (six) hours as needed for mild pain or headache.    . ALPRAZolam (XANAX) 1 MG tablet Take 0.5 mg by mouth 3 (three) times daily as needed for anxiety or sleep. anxiety    . azelastine (ASTELIN) 137 MCG/SPRAY nasal spray Place 2 sprays into both nostrils 2 (two) times daily as needed for rhinitis. Use in each nostril as directed    . cholecalciferol (VITAMIN D) 1000 UNITS tablet Take 1,000 Units by mouth daily.    . cyclobenzaprine (FLEXERIL) 10 MG tablet Take 1 tablet (10 mg total) by  mouth 2 (two) times daily as needed for muscle spasms. 20 tablet 0  . diclofenac (VOLTAREN) 75 MG EC tablet Take 75 mg by mouth 2 (two) times daily.      Marland Kitchen. ibuprofen (ADVIL,MOTRIN) 800 MG tablet Take 1 tablet (800 mg total) by mouth 3 (three) times daily. 21 tablet 0  . lisinopril-hydrochlorothiazide (PRINZIDE,ZESTORETIC) 10-12.5 MG per tablet Take 1 tablet by mouth daily.    . naproxen (NAPROSYN) 500 MG tablet Take 1 tablet (500 mg total) by mouth 2 (two) times daily with a meal. 30 tablet 0  . promethazine (PHENERGAN) 25 MG tablet Take 25 mg by mouth every 6 (six) hours as needed. For nausea    . senna (SENOKOT) 8.6 MG TABS tablet Take 1 tablet by mouth daily.    Marland Kitchen. venlafaxine (EFFEXOR) 75 MG tablet Take 75 mg by mouth daily.    Marland Kitchen. albuterol (PROVENTIL HFA;VENTOLIN  HFA) 108 (90 BASE) MCG/ACT inhaler Inhale 2 puffs into the lungs every 6 (six) hours as needed. For shortness of breath      No results found for this or any previous visit (from the past 48 hour(s)). No results found.  Review of Systems  Constitutional: Negative.   HENT: Negative.   Eyes: Negative.   Respiratory: Negative.   Cardiovascular: Negative.   Gastrointestinal: Negative.   Genitourinary: Negative.   Musculoskeletal: Positive for back pain and neck pain.  Skin: Negative.   Neurological: Negative.   Psychiatric/Behavioral: Negative.     Blood pressure 118/81, pulse 95, temperature 98.2 F (36.8 C), temperature source Oral, resp. rate 20, height 5\' 11"  (1.803 m), weight (!) 138.8 kg (306 lb), SpO2 99 %. Physical Exam  Constitutional: She is oriented to person, place, and time. She appears well-developed and well-nourished.  HENT:  Head: Normocephalic and atraumatic.  Eyes: EOM are normal. Pupils are equal, round, and reactive to light.  Cardiovascular: Normal rate.   Respiratory: Effort normal.  GI: Soft.  Neurological: She is alert and oriented to person, place, and time.  Skin: Skin is warm.  Psychiatric: She has a normal mood and affect. Her behavior is normal. Thought content normal.     Assessment/Plan Plan for bilateral breast reduction.  Peggye FormLAIRE S Hooper Petteway, DO 01/09/2016, 10:17 AM

## 2016-01-10 DIAGNOSIS — N62 Hypertrophy of breast: Secondary | ICD-10-CM | POA: Diagnosis not present

## 2016-01-10 MED ORDER — NITROGLYCERIN 2 % TD OINT: 1.0000 [in_us] | TOPICAL_OINTMENT | Freq: Four times a day (QID) | TRANSDERMAL | 0 refills | Status: DC

## 2016-01-10 NOTE — Discharge Instructions (Signed)
About my Jackson-Pratt Bulb Drain ° °What is a Jackson-Pratt bulb? °A Jackson-Pratt is a soft, round device used to collect drainage. It is connected to a long, thin drainage catheter, which is held in place by one or two small stiches near your surgical incision site. When the bulb is squeezed, it forms a vacuum, forcing the drainage to empty into the bulb. ° °Emptying the Jackson-Pratt bulb- °To empty the bulb: °1. Release the plug on the top of the bulb. °2. Pour the bulb's contents into a measuring container which your nurse will provide. °3. Record the time emptied and amount of drainage. Empty the drain(s) as often as your     doctor or nurse recommends. ° °Date                  Time                    Amount (Drain 1)                 Amount (Drain 2) ° °_____________________________________________________________________ ° °_____________________________________________________________________ ° °_____________________________________________________________________ ° °_____________________________________________________________________ ° °_____________________________________________________________________ ° °_____________________________________________________________________ ° °_____________________________________________________________________ ° °_____________________________________________________________________ ° °Squeezing the Jackson-Pratt Bulb- °To squeeze the bulb: °1. Make sure the plug at the top of the bulb is open. °2. Squeeze the bulb tightly in your fist. You will hear air squeezing from the bulb. °3. Replace the plug while the bulb is squeezed. °4. Use a safety pin to attach the bulb to your clothing. This will keep the catheter from     pulling at the bulb insertion site. ° °When to call your doctor- °Call your doctor if: °· Drain site becomes red, swollen or hot. °· You have a fever greater than 101 degrees F. °· There is oozing at the drain site. °· Drain falls out (apply a guaze  bandage over the drain hole and secure it with tape). °· Drainage increases daily not related to activity patterns. (You will usually have more drainage when you are active than when you are resting.) °· Drainage has a bad odor. ° ° °Post Anesthesia Home Care Instructions ° °Activity: °Get plenty of rest for the remainder of the day. A responsible adult should stay with you for 24 hours following the procedure.  °For the next 24 hours, DO NOT: °-Drive a car °-Operate machinery °-Drink alcoholic beverages °-Take any medication unless instructed by your physician °-Make any legal decisions or sign important papers. ° °Meals: °Start with liquid foods such as gelatin or soup. Progress to regular foods as tolerated. Avoid greasy, spicy, heavy foods. If nausea and/or vomiting occur, drink only clear liquids until the nausea and/or vomiting subsides. Call your physician if vomiting continues. ° °Special Instructions/Symptoms: °Your throat may feel dry or sore from the anesthesia or the breathing tube placed in your throat during surgery. If this causes discomfort, gargle with warm salt water. The discomfort should disappear within 24 hours. ° °If you had a scopolamine patch placed behind your ear for the management of post- operative nausea and/or vomiting: ° °1. The medication in the patch is effective for 72 hours, after which it should be removed.  Wrap patch in a tissue and discard in the trash. Wash hands thoroughly with soap and water. °2. You may remove the patch earlier than 72 hours if you experience unpleasant side effects which may include dry mouth, dizziness or visual disturbances. °3. Avoid touching the patch. Wash your hands with soap and water after contact with the   patch. °  ° ° °

## 2016-01-10 NOTE — Addendum Note (Signed)
Addendum  created 01/10/16 [id number] by [place] DesanctisMargaret L Shaney Deckman, CRNA   Charge Capture section accepted

## 2016-01-10 NOTE — Addendum Note (Signed)
Addendum  created 01/10/16 0654 by [place] DesanctisMargaret L Serigne Kubicek, CRNA   Charge Capture section accepted

## 2016-01-11 ENCOUNTER — Encounter (HOSPITAL_BASED_OUTPATIENT_CLINIC_OR_DEPARTMENT_OTHER): Payer: Self-pay | Admitting: Plastic Surgery

## 2016-10-09 ENCOUNTER — Other Ambulatory Visit: Payer: Self-pay | Admitting: Plastic Surgery

## 2017-03-07 DIAGNOSIS — Z9151 Personal history of suicidal behavior: Secondary | ICD-10-CM

## 2017-03-07 HISTORY — DX: Personal history of suicidal behavior: Z91.51

## 2017-03-23 ENCOUNTER — Other Ambulatory Visit: Payer: Self-pay

## 2017-03-23 ENCOUNTER — Emergency Department (HOSPITAL_COMMUNITY): Payer: Medicare Other

## 2017-03-23 ENCOUNTER — Encounter (HOSPITAL_COMMUNITY): Payer: Self-pay

## 2017-03-23 ENCOUNTER — Emergency Department (HOSPITAL_COMMUNITY)
Admission: EM | Admit: 2017-03-23 | Discharge: 2017-03-24 | Disposition: A | Discharge status: Other Acute Inpatient Hospital | Payer: Medicare Other | Attending: Emergency Medicine | Admitting: Emergency Medicine

## 2017-03-23 DIAGNOSIS — R4182 Altered mental status, unspecified: Secondary | ICD-10-CM | POA: Diagnosis not present

## 2017-03-23 DIAGNOSIS — I10 Essential (primary) hypertension: Secondary | ICD-10-CM | POA: Diagnosis not present

## 2017-03-23 DIAGNOSIS — Z79899 Other long term (current) drug therapy: Secondary | ICD-10-CM | POA: Diagnosis not present

## 2017-03-23 DIAGNOSIS — W19XXXA Unspecified fall, initial encounter: Secondary | ICD-10-CM

## 2017-03-23 DIAGNOSIS — R410 Disorientation, unspecified: Secondary | ICD-10-CM

## 2017-03-23 DIAGNOSIS — Z87891 Personal history of nicotine dependence: Secondary | ICD-10-CM | POA: Insufficient documentation

## 2017-03-23 DIAGNOSIS — J45909 Unspecified asthma, uncomplicated: Secondary | ICD-10-CM | POA: Diagnosis not present

## 2017-03-23 DIAGNOSIS — T50902A Poisoning by unspecified drugs, medicaments and biological substances, intentional self-harm, initial encounter: Secondary | ICD-10-CM

## 2017-03-23 DIAGNOSIS — R000 Tachycardia, unspecified: Secondary | ICD-10-CM | POA: Insufficient documentation

## 2017-03-23 LAB — URINALYSIS, ROUTINE W REFLEX MICROSCOPIC
BILIRUBIN URINE: NEGATIVE
Glucose, UA: NEGATIVE mg/dL
Hgb urine dipstick: NEGATIVE
Ketones, ur: NEGATIVE mg/dL
LEUKOCYTES UA: NEGATIVE
Nitrite: NEGATIVE
PH: 5 (ref 5.0–8.0)
Protein, ur: NEGATIVE mg/dL
SPECIFIC GRAVITY, URINE: 1.006 (ref 1.005–1.030)

## 2017-03-23 LAB — COMPREHENSIVE METABOLIC PANEL
ALBUMIN: 4.1 g/dL (ref 3.5–5.0)
ALT: 21 U/L (ref 14–54)
AST: 28 U/L (ref 15–41)
Alkaline Phosphatase: 85 U/L (ref 38–126)
Anion gap: 8 (ref 5–15)
BILIRUBIN TOTAL: 0.6 mg/dL (ref 0.3–1.2)
BUN: 9 mg/dL (ref 6–20)
CHLORIDE: 106 mmol/L (ref 101–111)
CO2: 24 mmol/L (ref 22–32)
CREATININE: 0.81 mg/dL (ref 0.44–1.00)
Calcium: 9.3 mg/dL (ref 8.9–10.3)
GFR calc Af Amer: >60 mL/min (ref >60)
GLUCOSE: 100 mg/dL — AB (ref 65–99)
POTASSIUM: 3.7 mmol/L (ref 3.5–5.1)
Sodium: 138 mmol/L (ref 135–145)
Total Protein: 8.1 g/dL (ref 6.5–8.1)

## 2017-03-23 LAB — PROTIME-INR
INR: 1.05
Prothrombin Time: 13.6 seconds (ref 11.4–15.2)

## 2017-03-23 LAB — DIFFERENTIAL
BASOS ABS: 0 10*3/uL (ref 0.0–0.1)
BASOS PCT: 0 %
Eosinophils Absolute: 0 10*3/uL (ref 0.0–0.7)
Eosinophils Relative: 0 %
LYMPHS ABS: 1.3 10*3/uL (ref 0.7–4.0)
Lymphocytes Relative: 12 %
MONOS PCT: 6 %
Monocytes Absolute: 0.7 10*3/uL (ref 0.1–1.0)
NEUTROS ABS: 9.5 10*3/uL — AB (ref 1.7–7.7)
NEUTROS PCT: 82 %

## 2017-03-23 LAB — MAGNESIUM: Magnesium: 2 mg/dL (ref 1.7–2.4)

## 2017-03-23 LAB — RAPID URINE DRUG SCREEN, HOSP PERFORMED
Amphetamines: NOT DETECTED
BARBITURATES: NOT DETECTED
BENZODIAZEPINES: POSITIVE — AB
COCAINE: NOT DETECTED
Opiates: NOT DETECTED
TETRAHYDROCANNABINOL: NOT DETECTED

## 2017-03-23 LAB — CBC
HEMATOCRIT: 48.6 % — AB (ref 36.0–46.0)
HEMOGLOBIN: 16.1 g/dL — AB (ref 12.0–15.0)
MCH: 32.5 pg (ref 26.0–34.0)
MCHC: 33.1 g/dL (ref 30.0–36.0)
MCV: 98.2 fL (ref 78.0–100.0)
Platelets: 217 10*3/uL (ref 150–400)
RBC: 4.95 MIL/uL (ref 3.87–5.11)
RDW: 13 % (ref 11.5–15.5)
WBC: 11.5 10*3/uL — AB (ref 4.0–10.5)

## 2017-03-23 LAB — ETHANOL

## 2017-03-23 LAB — APTT: aPTT: 32 seconds (ref 24–36)

## 2017-03-23 LAB — TROPONIN I: Troponin I: <0.03 ng/mL (ref <0.03)

## 2017-03-23 LAB — SALICYLATE LEVEL

## 2017-03-23 LAB — ACETAMINOPHEN LEVEL

## 2017-03-23 LAB — CBG MONITORING, ED: GLUCOSE-CAPILLARY: 88 mg/dL (ref 65–99)

## 2017-03-23 LAB — TSH: TSH: 4.095 u[IU]/mL (ref 0.350–4.500)

## 2017-03-23 MED ORDER — VITAMIN D3 25 MCG (1000 UNIT) PO TABS: 1000.0000 [IU] | ORAL_TABLET | Freq: Every day | ORAL | Status: DC

## 2017-03-23 MED ORDER — TRAZODONE HCL 50 MG PO TABS: 50.0000 mg | ORAL_TABLET | Freq: Every day | ORAL | Status: DC

## 2017-03-23 MED ORDER — ONDANSETRON HCL 4 MG PO TABS: 4.0000 mg | ORAL_TABLET | Freq: Three times a day (TID) | ORAL | Status: DC | PRN

## 2017-03-23 MED ORDER — VENLAFAXINE HCL 75 MG PO TABS: 75.0000 mg | ORAL_TABLET | Freq: Every day | ORAL | Status: DC

## 2017-03-23 MED ORDER — SODIUM CHLORIDE 0.9 % IV BOLUS (SEPSIS)
1000.0000 mL | Freq: Once | INTRAVENOUS | Status: AC
  Administered 2017-03-23: 1000 mL via INTRAVENOUS

## 2017-03-23 MED ORDER — SODIUM CHLORIDE 0.9 % IV SOLN: INTRAVENOUS | Status: DC

## 2017-03-23 MED ORDER — PROMETHAZINE HCL 12.5 MG PO TABS: 25.0000 mg | ORAL_TABLET | Freq: Four times a day (QID) | ORAL | Status: DC | PRN

## 2017-03-23 MED ORDER — POTASSIUM CHLORIDE CRYS ER 20 MEQ PO TBCR
40.0000 meq | EXTENDED_RELEASE_TABLET | Freq: Once | ORAL | Status: AC
  Administered 2017-03-23: 40 meq via ORAL
  Filled 2017-03-23: qty 2

## 2017-03-23 MED ORDER — IBUPROFEN 400 MG PO TABS: 600.0000 mg | ORAL_TABLET | Freq: Three times a day (TID) | ORAL | Status: DC | PRN

## 2017-03-23 MED ORDER — PANTOPRAZOLE SODIUM 40 MG PO TBEC: 40.0000 mg | DELAYED_RELEASE_TABLET | Freq: Every day | ORAL | Status: DC

## 2017-03-23 MED ORDER — ACETAMINOPHEN 500 MG PO TABS: 1000.0000 mg | ORAL_TABLET | Freq: Four times a day (QID) | ORAL | Status: DC | PRN

## 2017-03-23 MED ORDER — BUPROPION HCL ER (XL) 300 MG PO TB24: 300.0000 mg | ORAL_TABLET | Freq: Every day | ORAL | Status: DC

## 2017-03-23 MED ORDER — DICLOFENAC SODIUM 75 MG PO TBEC: 75.0000 mg | DELAYED_RELEASE_TABLET | Freq: Two times a day (BID) | ORAL | Status: DC

## 2017-03-23 MED ORDER — MONTELUKAST SODIUM 10 MG PO TABS: 10.0000 mg | ORAL_TABLET | Freq: Every day | ORAL | Status: DC

## 2017-03-23 MED ORDER — FLUOXETINE HCL 40 MG PO CAPS: 40.0000 mg | ORAL_CAPSULE | Freq: Every day | ORAL | Status: DC

## 2017-03-23 MED ORDER — LISINOPRIL-HYDROCHLOROTHIAZIDE 10-12.5 MG PO TABS: 1.0000 | ORAL_TABLET | Freq: Every day | ORAL | Status: DC

## 2017-03-23 MED ORDER — ALBUTEROL SULFATE HFA 108 (90 BASE) MCG/ACT IN AERS: 2.0000 | INHALATION_SPRAY | Freq: Four times a day (QID) | RESPIRATORY_TRACT | Status: DC | PRN

## 2017-03-23 MED ORDER — IRBESARTAN 150 MG PO TABS: 150.0000 mg | ORAL_TABLET | Freq: Every day | ORAL | Status: DC

## 2017-03-23 NOTE — ED Notes (Signed)
Patient was dizzy and rated pain an 8 upon ambulation. She reported that her hip and her back her upon standing.

## 2017-03-23 NOTE — Progress Notes (Signed)
Asked to consult on this patient.  In review of the chart, the patient was brought in for AMS and had an extensive evaluation including CT and MRI.  After completion of the evaluation, a text message was discovered that reportedly acknowledged SI with overdose of 100 Benadryl and 30 Xanax written at 10:30 this AM.  Half-life of Benadryl is 3.4-9.2 hours and the half-life of Xanax is 11.2 hours - meaning that within the next few hours both of these medications should be effectively cleared from her system.  Her labs are normal; her vital signs are normalizing; and her prolonged QT has resolved.  As such, I recommend continued monitoring of the patient in the ER for the next few hours and consulting psychiatry for placement.  Unless she develops new/worsening symptoms, the patient appears to be improving and therefore should not require inpatient monitoring.  Instead, this may delay her ability to be placed in a psychiatric facility that is better equipped to handle her suicidality.  [name], M.D.

## 2017-03-23 NOTE — ED Triage Notes (Signed)
Patient brought in from home by [facility]. Per EMS patient was last seen normal last night at 2100 by patient mother. States patients mother herd patient fall this morning at 11:30-1200. EMS stated patient mother stated patient had "jerking movement". Patients has altered mentation, gazed to the right and generalized weakness with more weakness of left side.

## 2017-03-23 NOTE — ED Notes (Signed)
Patient to MRI.

## 2017-03-23 NOTE — ED Provider Notes (Signed)
[facility]GENCY DEPARTMENT Provider Note   CSN: [id number] Arrival date & time: 03/23/17  1259     History   Chief Complaint Chief Complaint  Patient presents with  . Altered Mental Status    HPI [name] is a 46 y.o. female.  Patient brought in by EMS for altered mental status.  Last normal according to patient's mother 2100 last evening.  Patient did not come out of her room this morning door was locked.  Mother knocked on the dorsa that she was fine and up the patient was to be taking mother to a medical appointment today.  Then the mother kind of heard some commotion in the room and felt that patient probably fell did not some pictures off the wall.  Mother went in and noted that she was very confused and not acting normal but she was awake.  Apparently there was a call to a significant person to her in the middle of the night that may have suggested she may have been contemplating suicide.  Patient denies that currently.  The patient acting somewhat bizarre.  Upon arrival here patient would move all 4 extremities.  EMS was concerned about weakness to the left arm and abnormal speech.  Based on her presentation certainly not a TPA candidate based on the time.  Not clear if this is actually stroke or not.  But stroke order set ordered.      Past Medical History:  Diagnosis Date  . Anxiety   . Arthritis   . Asthma   . Depression   . Endometriosis t  . Gastric bypass status for obesity t  . GERD (gastroesophageal reflux disease) t  . Hernia, diaphragmatic t  . Hx of hysterectomy t  . Hypertension   . PONV (postoperative nausea and vomiting)   . S/P cholecystectomy t    Patient Active Problem List   Diagnosis Date Noted  . Symptomatic mammary hypertrophy 01/09/2016  . Nicotine dependence 12/25/2015    Past Surgical History:  Procedure Laterality Date  . ABDOMINAL HYSTERECTOMY    . ABDOMINAL SURGERY    . BREAST REDUCTION SURGERY Bilateral 01/09/2016   Procedure: MAMMARY REDUCTION  (BREAST) BILATERAL;  Surgeon: Peggye Formlaire S Dillingham, DO;  Location: [facility];  Service: Plastics;  Laterality: Bilateral;  . CHOLECYSTECTOMY    . HERNIA REPAIR     hiatal hernia x3, abd hernia  . HIATAL HERNIA REPAIR     was done with lapband on two occasions.  [employer] mesh was used and removed  . lap band removal    . LAPAROSCOPIC GASTRIC BANDING  2005   was done with hiatus hernia repair.  redone in 2006   . UMBILICAL HERNIA REPAIR  01/10/11   emergency hernia repair due to hernia twisted in intestines     OB History    No data available       Home Medications    Prior to Admission medications   Medication Sig Start Date End Date Taking? Authorizing Provider  acetaminophen (TYLENOL) 500 MG tablet Take 1,000 mg by mouth every 6 (six) hours as needed for mild pain or headache.   Yes [provider]  albuterol (PROVENTIL HFA;VENTOLIN HFA) 108 (90 BASE) MCG/ACT inhaler Inhale 2 puffs into the lungs every 6 (six) hours as needed. For shortness of breath   Yes [provider]  ALPRAZolam (XANAX) 1 MG tablet Take 0.5 mg by mouth 3 (three) times daily as needed for anxiety or  sleep. anxiety   Yes [provider]  azelastine (ASTELIN) 137 MCG/SPRAY nasal spray Place 2 sprays into both nostrils 2 (two) times daily as needed for rhinitis. Use in each nostril as directed   Yes [provider]  buPROPion (WELLBUTRIN XL) 300 MG 24 hr tablet Take 300 mg by mouth daily.   Yes [provider]  cholecalciferol (VITAMIN D) 1000 UNITS tablet Take 1,000 Units by mouth daily.   Yes [provider]  cyclobenzaprine (FLEXERIL) 10 MG tablet Take 1 tablet (10 mg total) by mouth 2 (two) times daily as needed for muscle spasms. 03/20/15  Yes Eber HongMiller, Brian, MD  diclofenac (VOLTAREN) 75 MG EC tablet Take 75 mg by mouth 2 (two) times daily.     Yes [provider]  FLUoxetine (PROZAC) 40 MG capsule Take 40  mg by mouth daily.   Yes [provider]  lisinopril-hydrochlorothiazide (PRINZIDE,ZESTORETIC) 10-12.5 MG per tablet Take 1 tablet by mouth daily.   Yes [provider]  montelukast (SINGULAIR) 10 MG tablet Take 10 mg by mouth at bedtime.   Yes [provider]  nitroGLYCERIN (NITROGLYN) 2 % ointment Apply 1 inch topically every 6 (six) hours. 01/10/16  Yes Rayburn, Fanny BienShawn Montgomery, PA-C  olmesartan (BENICAR) 20 MG tablet Take 20 mg by mouth daily.   Yes [provider]  pantoprazole (PROTONIX) 40 MG tablet Take 40 mg by mouth daily.   Yes [provider]  promethazine (PHENERGAN) 25 MG tablet Take 25 mg by mouth every 6 (six) hours as needed. For nausea   Yes [provider]  senna (SENOKOT) 8.6 MG TABS tablet Take 1 tablet by mouth daily.   Yes [provider]  traZODone (DESYREL) 50 MG tablet Take 50 mg by mouth at bedtime.   Yes [provider]  venlafaxine (EFFEXOR) 75 MG tablet Take 75 mg by mouth daily.   Yes [provider]    Family History Family History  Problem Relation Age of Onset  . Cancer Maternal Aunt        melanoma  . Diabetes Father   . Heart attack Father     Social History Social History   Tobacco Use  . Smoking status: Former Smoker    Types: Cigarettes    Last attempt to quit: 05/20/2015    Years since quitting: 1.8  . Smokeless tobacco: Never Used  Substance Use Topics  . Alcohol use: No  . Drug use: No     Allergies   Amoxicillin and Influenza vaccines   Review of Systems Review of Systems  Unable to perform ROS: Mental status change     Physical Exam Updated Vital Signs BP (!) 137/92   Pulse (!) 118   Temp 97.8 F (36.6 C) (Oral)   Resp (!) 22   Ht 1.803 m (5\' 11" )   Wt (!) 138.8 kg (306 lb)   SpO2 97%   BMI 42.68 kg/m   Physical Exam  Constitutional: She appears well-developed and well-nourished. She appears distressed.  HENT:  Head: Normocephalic  and atraumatic.  Mucous membranes slightly dry  Eyes: Conjunctivae and EOM are normal. Pupils are equal, round, and reactive to light.  Patient eyes are moving a little bit aimlessly.  But she will focusing and follow-up finger.  No true nystagmus.  Neck: Normal range of motion. Neck supple.  Cardiovascular: Normal heart sounds.  Tachycardic regular  Pulmonary/Chest: Effort normal and breath sounds normal. No respiratory distress.  Abdominal: Soft. Bowel sounds are  normal. She exhibits no distension. There is no tenderness.  Musculoskeletal: She exhibits no tenderness or deformity.  Neurological: She is alert. No cranial nerve deficit.  Confused will follow commands.  Will move all 4 extremities.  Maybe a hint of some bilateral lower extremity weakness.  No upper extremity weakness at all.  Skin: Skin is warm. Capillary refill takes less than 2 seconds. No rash noted.  Nursing note and vitals reviewed.    ED Treatments / Results  Labs (all labs ordered are listed, but only abnormal results are displayed) Labs Reviewed  CBC - Abnormal; Notable for the following components:      Result Value   WBC 11.5 (*)    Hemoglobin 16.1 (*)    HCT 48.6 (*)    All other components within normal limits  DIFFERENTIAL - Abnormal; Notable for the following components:   Neutro Abs 9.5 (*)    All other components within normal limits  COMPREHENSIVE METABOLIC PANEL - Abnormal; Notable for the following components:   Glucose, Bld 100 (*)    All other components within normal limits  ACETAMINOPHEN LEVEL - Abnormal; Notable for the following components:   Acetaminophen (Tylenol), Serum <10 (*)    All other components within normal limits  PROTIME-INR  APTT  TROPONIN I  ETHANOL  RAPID URINE DRUG SCREEN, HOSP PERFORMED  URINALYSIS, ROUTINE W REFLEX MICROSCOPIC  TSH  CBG MONITORING, ED    EKG  EKG Interpretation  Date/Time:  Monday March 23 2017 13:02:59 EST Ventricular Rate:  121 PR  Interval:    QRS Duration: 88 QT Interval:  353 QTC Calculation: 501 R Axis:   71 Text Interpretation:  Sinus tachycardia Borderline repolarization abnormality Borderline prolonged QT interval Confirmed by Vanetta Mulders [phone]) on 03/23/2017 1:42:22 PM       Radiology Ct Head Wo Contrast  Result Date: 03/23/2017 CLINICAL DATA:  46 year old female with altered mental status and level of consciousness. EXAM: CT HEAD WITHOUT CONTRAST TECHNIQUE: Contiguous axial images were obtained from the base of the skull through the vertex without intravenous contrast. COMPARISON:  None. FINDINGS: Brain: No evidence of acute infarction, hemorrhage, hydrocephalus, extra-axial collection or mass lesion/mass effect. Vascular: No hyperdense vessel or unexpected calcification. Skull: Normal. Negative for fracture or focal lesion. Sinuses/Orbits: No acute finding. Other: None. IMPRESSION: Unremarkable noncontrast head CT. Electronically Signed   By: Harmon Pier M.D.   On: 03/23/2017 13:34   Mr Brain Wo Contrast (neuro Protocol)  Result Date: 03/23/2017 CLINICAL DATA:  Focal neuro deficit. Fall this morning. Possible seizure. EXAM: MRI HEAD WITHOUT CONTRAST TECHNIQUE: Multiplanar, multiecho pulse sequences of the brain and surrounding structures were obtained without intravenous contrast. COMPARISON:  Head CT from earlier today FINDINGS: Significantly motion degraded study to a degree that findings could be easily obscured. Sagittal T1, axial T2, diffusion imaging was acquired. Diffusion imaging is reasonable quality and negative for infarct. There is no hydrocephalus, visible swelling, or visible mass. Major flow voids are grossly preserved. IMPRESSION: Partial and significantly motion degraded study. Diffusion imaging is diagnostic and negative for infarct. No detected abnormality. Electronically Signed   By: Marnee Spring M.D.   On: 03/23/2017 14:34   Dg Chest Port 1 View  Result Date: 03/23/2017 CLINICAL  DATA:  Altered mental status EXAM: PORTABLE CHEST 1 VIEW COMPARISON:  Chest x-ray dated December 01, 2010 FINDINGS: The lungs are mildly hypo inflated. The interstitial markings are increased. The cardiac silhouette is not enlarged. There is soft tissue fullness in the left  infrahilar region. There is no pleural effusion. IMPRESSION: Limited study due to hypoinflation and the patient positioning. Probable left infrahilar infiltrate or less likely a mass. There is a known hiatal hernia which may be contributing to this density as well. No overt CHF. When the patient can tolerate the procedure, a well-positioned frontal chest x-ray would be useful. Electronically Signed   By: David  [place] M.D.   On: 03/23/2017 14:13    Procedures Procedures (including critical care time)  CRITICAL CARE Performed by: Vanetta Mulders Total critical care time: 30 critical  minutes Critical care time was exclusive of separately billable procedures and treating other patients. Critical care was necessary to treat or prevent imminent or life-threatening deterioration. Critical care was time spent personally by me on the following activities: development of treatment plan with patient and/or surrogate as well as nursing, discussions with consultants, evaluation of patient's response to treatment, examination of patient, obtaining history from patient or surrogate, ordering and performing treatments and interventions, ordering and review of laboratory studies, ordering and review of radiographic studies, pulse oximetry and re-evaluation of patient's condition.   Medications Ordered in ED Medications  sodium chloride 0.9 % bolus 1,000 mL (not administered)  0.9 %  sodium chloride infusion (not administered)     Initial Impression / Assessment and Plan / ED Course  I have reviewed the triage vital signs and the nursing notes.  Pertinent labs & imaging results that were available during my care of the patient were reviewed  by me and considered in my medical decision making (see chart for details).    Patient last seen normal at 2100 last night.  Originally arrived with concerns for possible stroke.  Patient more with altered mental status.  Some question initially of some left arm weakness.  However shortly after arrival to emergency department patient moving everything spontaneously and strength seems good.  No cranial nerve deficits.  Perhaps may be some slurred speech or speech abnormality.  Patient acting somewhat bizarre.  Family a little suspicious she may have taken not to much medication possibly in a suicide attempt no notes.  But made an unusual phone call to somebody she is close to in the middle of the night suggesting that she was planning not to be here any longer.  Workup here head CT negative MRI brain negative.  Chest x-ray without any acute findings.  Patient now more cooperative more alert but still acting unusual.  Still tachycardic with complaint of left hip area pain so we will get x-rays of pelvis and bilateral hips.  The rule out any fracture.  Urinalysis and urine drug screen still pending.  EKG as mentioned shows sinus tach little bit of borderline prolonged QT.  No other significant findings.  Alcohol negative Tylenol negative liver function test without any significant abnormal doses.  CBC without any acute findings.  As noted above.  Patient was initially consider is possible stroke.  Clearly not a TPA candidate.  But after further evaluation was unlikely there was any significant focal neuro deficit more just altered mental status and some concerns for possible overdose.  As times gone on patient has become more awake and alert.  Patient still talking strangely MRI of brain without any acute findings.  Feel the patient's probably can require medical admission.  Not clear that this is all psychiatric at this time.  No concerns for any significant Tylenol overdose liver function test are  normal.  Tylenol level was negative no signs of metabolic acidosis.  Some concern EKG with prolonged QT borderline.  But no widening of the QRS.  Final Clinical Impressions(s) / ED Diagnoses   Final diagnoses:  Altered mental status, unspecified altered mental status type  Confusion  Tachycardia  Fall, initial encounter    ED Discharge Orders    None       Vanetta Mulders, MD 03/23/17 [phone]

## 2017-03-23 NOTE — ED Provider Notes (Signed)
Please see previous physicians note regarding patient's presenting history and physical, initial ED course, and associated medical decision making.   In short this is a 46 year old female with history of anxiety who presents with altered mental status.  During ED course, mental status is improving however she does have inappropriate speech at times.  Is persistently tachycardic.  I did speak to patient's family.  More recently they found a text message that she had sent a friend.  She wrote "I am dying.  I took a bottle of Benadryl 100 and 30 xanax." This message was written at about 10:29 AM. AMS and tachycardic could be explained by anticholinergic toxicity from benadryl.   We did speak to poison control, who recommending observing until mental status clears, vital signs normal, goal EKG Qtc < 500, and potassium/magnesium optimization.   Patient also received XR hip to rule out fracture. Question possible lucency versus overlying soft tissue. She has been able to ambulate without significant pain. Doubt fracture at this time.   Patient with improving vital signs and mental status while in the ED. Placed on suicide precautions. Will observe until medically cleared for TTS consult.   Patient observed over 6 hours in ED, with clearing mental status and normalizing tachycardia. She is felt to be medically cleared for TTS consult.  TTS does recommend inpatient placement.  Temporarily she is accepted at [facility], but they will reevaluate in the morning.   Lavera GuiseLiu, Jayda White Duo, MD 03/23/17 [phone]

## 2017-03-23 NOTE — ED Notes (Signed)
Pt taken to MRI  

## 2017-03-23 NOTE — ED Notes (Signed)
Writer called poison control  And spoke with [place]phanie. Per stephanie, patients  Magnesium needs to be on the higher side of WNL Potassium needs to be on the high side of WNL ( 4.5 -5) EKG = QT <500 Provide supportive care until vital signs are normal

## 2017-03-23 NOTE — ED Notes (Addendum)
Patient has been changed into scrubs, wanded by security, [facility] and requested EDP to restart home medications. Ok for pt to be removed from monitoring and IV removed per dr Verdie Mosherliu

## 2017-03-23 NOTE — ED Notes (Signed)
Per Dr. Jacqulyn BathLong, patient is not a code stroke.

## 2017-03-23 NOTE — ED Notes (Signed)
Patient to CT.
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
## 2017-03-23 NOTE — ED Notes (Signed)
Dr. Long at bedside to assess patient.

## 2017-03-23 NOTE — ED Notes (Signed)
Patient to xray.

## 2017-03-23 NOTE — BHH Counselor (Addendum)
Writer called [facility] TTS. Writer informed [facility] is at capacity tonight but will review in the AM.

## 2017-03-23 NOTE — ED Notes (Signed)
Patients son came to this Clinical research associatewriter and requested I look at patients social media account. Writer observed messages patient sent another person at 1029 AM (does not show date) that patients message read that she took a whole bottle of benadryl and 30 xanax and that she wanted to end end her life. Patient denies to writer taking any medication or wanting to harm herself. Dr. Dierdre SearlesLi made aware. Family at bedside.

## 2017-03-23 NOTE — ED Notes (Signed)
Dr. Zackowski at bedside  

## 2017-03-24 ENCOUNTER — Inpatient Hospital Stay (HOSPITAL_COMMUNITY)
Admission: AD | Admit: 2017-03-24 | Discharge: 2017-03-27 | DRG: 885 | Disposition: A | Discharge status: Home / Self Care | Payer: Medicare Other | Source: Intra-hospital | Attending: Psychiatry | Admitting: Psychiatry

## 2017-03-24 ENCOUNTER — Encounter (HOSPITAL_COMMUNITY): Payer: Self-pay | Admitting: *Deleted

## 2017-03-24 DIAGNOSIS — F332 Major depressive disorder, recurrent severe without psychotic features: Principal | ICD-10-CM | POA: Diagnosis present

## 2017-03-24 DIAGNOSIS — F39 Unspecified mood [affective] disorder: Secondary | ICD-10-CM | POA: Diagnosis not present

## 2017-03-24 DIAGNOSIS — Z8249 Family history of ischemic heart disease and other diseases of the circulatory system: Secondary | ICD-10-CM | POA: Diagnosis not present

## 2017-03-24 DIAGNOSIS — Z9884 Bariatric surgery status: Secondary | ICD-10-CM | POA: Diagnosis not present

## 2017-03-24 DIAGNOSIS — K589 Irritable bowel syndrome without diarrhea: Secondary | ICD-10-CM | POA: Diagnosis not present

## 2017-03-24 DIAGNOSIS — K3184 Gastroparesis: Secondary | ICD-10-CM | POA: Diagnosis not present

## 2017-03-24 DIAGNOSIS — F419 Anxiety disorder, unspecified: Secondary | ICD-10-CM | POA: Diagnosis present

## 2017-03-24 DIAGNOSIS — I10 Essential (primary) hypertension: Secondary | ICD-10-CM | POA: Diagnosis not present

## 2017-03-24 DIAGNOSIS — Z87891 Personal history of nicotine dependence: Secondary | ICD-10-CM | POA: Diagnosis not present

## 2017-03-24 DIAGNOSIS — Z823 Family history of stroke: Secondary | ICD-10-CM | POA: Diagnosis not present

## 2017-03-24 DIAGNOSIS — F17210 Nicotine dependence, cigarettes, uncomplicated: Secondary | ICD-10-CM | POA: Diagnosis not present

## 2017-03-24 DIAGNOSIS — G4700 Insomnia, unspecified: Secondary | ICD-10-CM | POA: Diagnosis not present

## 2017-03-24 DIAGNOSIS — Z818 Family history of other mental and behavioral disorders: Secondary | ICD-10-CM | POA: Diagnosis not present

## 2017-03-24 MED ORDER — VITAMIN B-1 100 MG PO TABS
100.0000 mg | ORAL_TABLET | Freq: Every day | ORAL | Status: DC
  Administered 2017-03-24: 100 mg via ORAL
  Filled 2017-03-24 (×3): qty 1

## 2017-03-24 MED ORDER — LOPERAMIDE HCL 2 MG PO CAPS: 2.0000 mg | ORAL_CAPSULE | ORAL | Status: DC | PRN

## 2017-03-24 MED ORDER — CHLORDIAZEPOXIDE HCL 25 MG PO CAPS: 25.0000 mg | ORAL_CAPSULE | ORAL | Status: DC

## 2017-03-24 MED ORDER — GABAPENTIN 100 MG PO CAPS
100.0000 mg | ORAL_CAPSULE | Freq: Two times a day (BID) | ORAL | Status: DC
  Administered 2017-03-24 – 2017-03-27 (×6): 100 mg via ORAL
  Filled 2017-03-24 (×12): qty 1

## 2017-03-24 MED ORDER — VENLAFAXINE HCL ER 37.5 MG PO CP24
37.5000 mg | ORAL_CAPSULE | Freq: Once | ORAL | Status: AC
  Administered 2017-03-24: 37.5 mg via ORAL
  Filled 2017-03-24 (×2): qty 1

## 2017-03-24 MED ORDER — GABAPENTIN 400 MG PO CAPS
400.0000 mg | ORAL_CAPSULE | Freq: Every day | ORAL | Status: DC
  Administered 2017-03-24 – 2017-03-26 (×3): 400 mg via ORAL
  Filled 2017-03-24 (×8): qty 1

## 2017-03-24 MED ORDER — LISINOPRIL-HYDROCHLOROTHIAZIDE 10-12.5 MG PO TABS: 1.0000 | ORAL_TABLET | Freq: Every day | ORAL | Status: DC

## 2017-03-24 MED ORDER — MIRTAZAPINE 15 MG PO TABS
15.0000 mg | ORAL_TABLET | Freq: Every day | ORAL | Status: DC
  Administered 2017-03-24: 15 mg via ORAL
  Filled 2017-03-24 (×6): qty 1

## 2017-03-24 MED ORDER — HYDROXYZINE HCL 25 MG PO TABS: 25.0000 mg | ORAL_TABLET | Freq: Four times a day (QID) | ORAL | Status: DC | PRN

## 2017-03-24 MED ORDER — CHLORDIAZEPOXIDE HCL 25 MG PO CAPS: 25.0000 mg | ORAL_CAPSULE | Freq: Four times a day (QID) | ORAL | Status: DC

## 2017-03-24 MED ORDER — PANTOPRAZOLE SODIUM 40 MG PO TBEC
40.0000 mg | DELAYED_RELEASE_TABLET | Freq: Every day | ORAL | Status: DC
  Administered 2017-03-24 – 2017-03-27 (×4): 40 mg via ORAL
  Filled 2017-03-24 (×6): qty 1

## 2017-03-24 MED ORDER — LORAZEPAM 0.5 MG PO TABS: 0.5000 mg | ORAL_TABLET | Freq: Once | ORAL | Status: DC

## 2017-03-24 MED ORDER — CHLORDIAZEPOXIDE HCL 25 MG PO CAPS: 25.0000 mg | ORAL_CAPSULE | Freq: Every day | ORAL | Status: DC

## 2017-03-24 MED ORDER — ADULT MULTIVITAMIN W/MINERALS CH
1.0000 | ORAL_TABLET | Freq: Every day | ORAL | Status: DC
  Administered 2017-03-24 – 2017-03-27 (×4): 1 via ORAL
  Filled 2017-03-24 (×6): qty 1

## 2017-03-24 MED ORDER — HYDROCHLOROTHIAZIDE 12.5 MG PO CAPS
12.5000 mg | ORAL_CAPSULE | Freq: Every day | ORAL | Status: DC
  Administered 2017-03-24: 12.5 mg via ORAL
  Filled 2017-03-24 (×4): qty 1

## 2017-03-24 MED ORDER — ACETAMINOPHEN 325 MG PO TABS
650.0000 mg | ORAL_TABLET | Freq: Four times a day (QID) | ORAL | Status: DC | PRN
  Administered 2017-03-24 – 2017-03-25 (×2): 650 mg via ORAL
  Filled 2017-03-24 (×2): qty 2

## 2017-03-24 MED ORDER — TRAZODONE HCL 50 MG PO TABS
50.0000 mg | ORAL_TABLET | Freq: Every evening | ORAL | Status: DC | PRN
  Administered 2017-03-24 – 2017-03-26 (×3): 50 mg via ORAL
  Filled 2017-03-24 (×12): qty 1

## 2017-03-24 MED ORDER — NICOTINE POLACRILEX 2 MG MT GUM
CHEWING_GUM | OROMUCOSAL | Status: AC
  Filled 2017-03-24: qty 1

## 2017-03-24 MED ORDER — ONDANSETRON 4 MG PO TBDP: 4.0000 mg | ORAL_TABLET | Freq: Four times a day (QID) | ORAL | Status: DC | PRN

## 2017-03-24 MED ORDER — CHLORDIAZEPOXIDE HCL 25 MG PO CAPS: 25.0000 mg | ORAL_CAPSULE | Freq: Three times a day (TID) | ORAL | Status: DC

## 2017-03-24 MED ORDER — CHLORDIAZEPOXIDE HCL 25 MG PO CAPS: 25.0000 mg | ORAL_CAPSULE | Freq: Four times a day (QID) | ORAL | Status: DC | PRN

## 2017-03-24 MED ORDER — VENLAFAXINE HCL ER 75 MG PO CP24
75.0000 mg | ORAL_CAPSULE | Freq: Every day | ORAL | Status: DC
  Administered 2017-03-25 – 2017-03-27 (×3): 75 mg via ORAL
  Filled 2017-03-24 (×6): qty 1

## 2017-03-24 MED ORDER — ALUM & MAG HYDROXIDE-SIMETH 200-200-20 MG/5ML PO SUSP: 30.0000 mL | ORAL | Status: DC | PRN

## 2017-03-24 MED ORDER — LISINOPRIL 10 MG PO TABS
10.0000 mg | ORAL_TABLET | Freq: Every day | ORAL | Status: DC
  Administered 2017-03-24: 10 mg via ORAL
  Filled 2017-03-24 (×2): qty 1
  Filled 2017-03-24: qty 2
  Filled 2017-03-24: qty 1

## 2017-03-24 MED ORDER — IRBESARTAN 150 MG PO TABS
150.0000 mg | ORAL_TABLET | Freq: Every day | ORAL | Status: DC
  Administered 2017-03-24 – 2017-03-27 (×4): 150 mg via ORAL
  Filled 2017-03-24 (×6): qty 1

## 2017-03-24 MED ORDER — NICOTINE POLACRILEX 2 MG MT GUM
2.0000 mg | CHEWING_GUM | OROMUCOSAL | Status: DC | PRN
  Administered 2017-03-24 – 2017-03-27 (×4): 2 mg via ORAL
  Filled 2017-03-24 (×3): qty 1

## 2017-03-24 MED ORDER — MAGNESIUM HYDROXIDE 400 MG/5ML PO SUSP
30.0000 mL | Freq: Every day | ORAL | Status: DC | PRN
Start: 2017-03-24 — End: 2017-03-27

## 2017-03-24 NOTE — H&P (Signed)
Psychiatric Admission Assessment Adult  Patient Identification: [name] MRN:  [id number] Date of Evaluation:  03/24/2017 Chief Complaint:  Suicidal attempt Principal Diagnosis: MDD Recurrent Diagnosis:   Patient Active Problem List   Diagnosis Date Noted  . MDD (major depressive disorder), recurrent episode, severe ([place]) [F33.2] 03/24/2017  . Symptomatic mammary hypertrophy [N62] 01/09/2016  . Nicotine dependence [F17.200] 12/25/2015   History of Present Illness:  46 y.o Caucasian female, separated, lives wit her mother and God son. Background history of MDD and anxiety disorder. Presented to the ER via emergency services. She presented with altered mental status. She overdosed on forty pills of Benadryl. She sent a friend text " I ma dying". Her mother heard her fall in the house. She had clonic tonic seizure activity at home. At the ER, patient regreted suicidal behavior. She denied any intent to kill herself. She reported being stressed by family demands. She takes care of her ailing parents and her God son.  Routine labs are significant for stable elevated Hct, mildly elevated WBCC. Toxicology is negative,  UDS is positive for benzodiazepines. No alcohol or substance use.   At interview, patient tells me that she had been suffering from insomnia. Says she had not slept for over seven days. Says she took the benadryl hoping she would get some sleep. Says gradually she took more and more. Denies any intent to kill self. Says she started feeling funny hence she sent a text to her friend. Patient says she is glad to be alive. Says she had been overwhelmed with day to day activity. Says her father recently had stroke. He has become forgetful since then. Says her step mother was recently diagnosed with lung cancer. Patient says she has been driving everyone around for their various appointments. Says she had not been eating much, she has been feeling more tired. Patient reports worsening  depression since she came off her antidepressant three years ago. Says she was recently restarted on another combination of medication. No associated hallucinations. No delusional preoccupation. No associated manic symptoms. Patient has suffered from anxiety all her life. Tells me her family has a history of cardiac disease and death from cardiac disease. Says she has always had tachycardia and this runs in the family. Patient says she wants to get better and be back with her family. She denies any current death wish. No new stressors. No thoughts of violence. No homicidal thoughts. No history of substance use. No access to weapons.   Total Time spent with patient: 1 hour  Past Psychiatric History: MDD and Anxiety disorder. She did well on combination of Prozac, Bupropion and Xanax in the past. Her PCP has always prescribed and managed her medications. Says Venlafaxine was recently added because she was having vasomotor symptoms. No past history of mania. No past history of psychosis. No past history of suicidal behavior. No past history of violent behavior. She has never had any inpatient treatment in the past. No past psychological or physical treatment.   Is the patient at risk to self? No.  Has the patient been a risk to self in the past 6 months? No.  Has the patient been a risk to self within the distant past? No.  Is the patient a risk to others? No  Has the patient been a risk to others in the past 6 months? No.  Has the patient been a risk to others within the distant past? No.   Prior Inpatient Therapy:   Prior Outpatient Therapy:  Alcohol Screening: 1. How often do you have a drink containing alcohol?: Never 2. How many drinks containing alcohol do you have on a typical day when you are drinking?: 1 or 2 3. How often do you have six or more drinks on one occasion?: Never AUDIT-C Score: 0 Intervention/Follow-up: AUDIT Score <7 follow-up not indicated Substance Abuse History in the  last 12 months:  No. Consequences of Substance Abuse: NA Previous Psychotropic Medications: Yes  Psychological Evaluations: Yes  Past Medical History:  Past Medical History:  Diagnosis Date  . Anxiety   . Arthritis   . Asthma   . Depression   . Endometriosis t  . Gastric bypass status for obesity t  . GERD (gastroesophageal reflux disease) t  . Hernia, diaphragmatic t  . Hx of hysterectomy t  . Hypertension   . PONV (postoperative nausea and vomiting)   . S/P cholecystectomy t    Past Surgical History:  Procedure Laterality Date  . ABDOMINAL HYSTERECTOMY    . ABDOMINAL SURGERY    . BREAST REDUCTION SURGERY Bilateral 01/09/2016   Procedure: MAMMARY REDUCTION  (BREAST) BILATERAL;  Surgeon: Wallace Going, DO;  Location: [facility];  Service: Plastics;  Laterality: Bilateral;  . CHOLECYSTECTOMY    . HERNIA REPAIR     hiatal hernia x3, abd hernia  . HIATAL HERNIA REPAIR     was done with lapband on two occasions.  [employer] mesh was used and removed  . lap band removal    . LAPAROSCOPIC GASTRIC BANDING  2005   was done with hiatus hernia repair.  redone in 2006   . UMBILICAL HERNIA REPAIR  01/10/11   emergency hernia repair due to hernia twisted in intestines    Family History:  Family History  Problem Relation Age of Onset  . Cancer Maternal Aunt        melanoma  . Diabetes Father   . Heart attack Father    Family Psychiatric  History: Has an uncle with Bipolar Disorder. No family history of suicide. No family history of addiction Tobacco Screening: Have you used any form of tobacco in the last 30 days? (Cigarettes, Smokeless Tobacco, Cigars, and/or Pipes): Yes Tobacco use, Select all that apply: 5 or more cigarettes per day Are you interested in Tobacco Cessation Medications?: Yes, will notify MD for an order Counseled patient on smoking cessation including recognizing danger situations, developing coping skills and basic information about quitting  provided: Refused/Declined practical counseling Social History:  Social History   Substance and Sexual Activity  Alcohol Use No     Social History   Substance and Sexual Activity  Drug Use No    Additional Social History: Marital status: Separated Separated, when?: 2 years ago Divorced, when?: not yet divorced What types of issues is patient dealing with in the relationship?: no current relationship Are you sexually active?: Yes What is your sexual orientation?: heterosexual Has your sexual activity been affected by drugs, alcohol, medication, or emotional stress?: no Does patient have children?: No    Pain Medications: See MAR Prescriptions: See MAR Over the Counter: See MAR History of alcohol / drug use?: No history of alcohol / drug abuse                    Allergies:   Allergies  Allergen Reactions  . Amoxicillin   . Influenza Vaccines     States her "arm had a knot and turned red"   Lab Results:  Results for orders placed or performed during the hospital encounter of 03/23/17 (from the past 48 hour(s))  Protime-INR     Status: None   Collection Time: 03/23/17  1:40 PM  Result Value Ref Range   Prothrombin Time 13.6 11.4 - 15.2 seconds   INR 1.05   APTT     Status: None   Collection Time: 03/23/17  1:40 PM  Result Value Ref Range   aPTT 32 24 - 36 seconds  CBC     Status: Abnormal   Collection Time: 03/23/17  1:40 PM  Result Value Ref Range   WBC 11.5 (H) 4.0 - 10.5 K/uL   RBC 4.95 3.87 - 5.11 MIL/uL   Hemoglobin 16.1 (H) 12.0 - 15.0 g/dL   HCT 48.6 (H) 36.0 - 46.0 %   MCV 98.2 78.0 - 100.0 fL   MCH 32.5 26.0 - 34.0 pg   MCHC 33.1 30.0 - 36.0 g/dL   RDW 13.0 11.5 - 15.5 %   Platelets 217 150 - 400 K/uL  Differential     Status: Abnormal   Collection Time: 03/23/17  1:40 PM  Result Value Ref Range   Neutrophils Relative % 82 %   Neutro Abs 9.5 (H) 1.7 - 7.7 K/uL   Lymphocytes Relative 12 %   Lymphs Abs 1.3 0.7 - 4.0 K/uL   Monocytes Relative  6 %   Monocytes Absolute 0.7 0.1 - 1.0 K/uL   Eosinophils Relative 0 %   Eosinophils Absolute 0.0 0.0 - 0.7 K/uL   Basophils Relative 0 %   Basophils Absolute 0.0 0.0 - 0.1 K/uL  Comprehensive metabolic panel     Status: Abnormal   Collection Time: 03/23/17  1:40 PM  Result Value Ref Range   Sodium 138 135 - 145 mmol/L   Potassium 3.7 3.5 - 5.1 mmol/L   Chloride 106 101 - 111 mmol/L   CO2 24 22 - 32 mmol/L   Glucose, Bld 100 (H) 65 - 99 mg/dL   BUN 9 6 - 20 mg/dL   Creatinine, Ser 0.81 0.44 - 1.00 mg/dL   Calcium 9.3 8.9 - 10.3 mg/dL   Total Protein 8.1 6.5 - 8.1 g/dL   Albumin 4.1 3.5 - 5.0 g/dL   AST 28 15 - 41 U/L   ALT 21 14 - 54 U/L   Alkaline Phosphatase 85 38 - 126 U/L   Total Bilirubin 0.6 0.3 - 1.2 mg/dL   GFR calc non Af Amer >60 >60 mL/min   GFR calc Af Amer >60 >60 mL/min    Comment: (NOTE) The eGFR has been calculated using the CKD EPI equation. This calculation has not been validated in all clinical situations. eGFR's persistently <60 mL/min signify possible Chronic Kidney Disease.    Anion gap 8 5 - 15  Troponin I     Status: None   Collection Time: 03/23/17  1:40 PM  Result Value Ref Range   Troponin I <0.03 <0.03 ng/mL  Ethanol     Status: None   Collection Time: 03/23/17  1:40 PM  Result Value Ref Range   Alcohol, Ethyl (B) <10 <10 mg/dL    Comment:        LOWEST DETECTABLE LIMIT FOR SERUM ALCOHOL IS 10 mg/dL FOR MEDICAL PURPOSES ONLY   Acetaminophen level     Status: Abnormal   Collection Time: 03/23/17  1:40 PM  Result Value Ref Range   Acetaminophen (Tylenol), Serum <10 (L) 10 - 30 ug/mL    Comment:  THERAPEUTIC CONCENTRATIONS VARY SIGNIFICANTLY. A RANGE OF 10-30 ug/mL MAY BE AN EFFECTIVE CONCENTRATION FOR MANY PATIENTS. HOWEVER, SOME ARE BEST TREATED AT CONCENTRATIONS OUTSIDE THIS RANGE. ACETAMINOPHEN CONCENTRATIONS >150 ug/mL AT 4 HOURS AFTER INGESTION AND >50 ug/mL AT 12 HOURS AFTER INGESTION ARE OFTEN ASSOCIATED WITH  TOXIC REACTIONS.   Magnesium     Status: None   Collection Time: 03/23/17  1:43 PM  Result Value Ref Range   Magnesium 2.0 1.7 - 2.4 mg/dL  Salicylate level     Status: None   Collection Time: 03/23/17  1:43 PM  Result Value Ref Range   Salicylate Lvl <5.2 2.8 - 30.0 mg/dL  Rapid urine drug screen (hospital performed)     Status: Abnormal   Collection Time: 03/23/17  1:44 PM  Result Value Ref Range   Opiates NONE DETECTED NONE DETECTED   Cocaine NONE DETECTED NONE DETECTED   Benzodiazepines POSITIVE (A) NONE DETECTED   Amphetamines NONE DETECTED NONE DETECTED   Tetrahydrocannabinol NONE DETECTED NONE DETECTED   Barbiturates NONE DETECTED NONE DETECTED  Urinalysis, Routine w reflex microscopic     Status: Abnormal   Collection Time: 03/23/17  1:44 PM  Result Value Ref Range   Color, Urine YELLOW YELLOW   APPearance HAZY (A) CLEAR   Specific Gravity, Urine 1.006 1.005 - 1.030   pH 5.0 5.0 - 8.0   Glucose, UA NEGATIVE NEGATIVE mg/dL   Hgb urine dipstick NEGATIVE NEGATIVE   Bilirubin Urine NEGATIVE NEGATIVE   Ketones, ur NEGATIVE NEGATIVE mg/dL   Protein, ur NEGATIVE NEGATIVE mg/dL   Nitrite NEGATIVE NEGATIVE   Leukocytes, UA NEGATIVE NEGATIVE   RBC / HPF 0-5 0 - 5 RBC/hpf   WBC, UA 0-5 0 - 5 WBC/hpf   Bacteria, UA RARE (A) NONE SEEN   Squamous Epithelial / LPF 6-30 (A) NONE SEEN   Mucus PRESENT   TSH     Status: None   Collection Time: 03/23/17  1:44 PM  Result Value Ref Range   TSH 4.095 0.350 - 4.500 uIU/mL    Comment: Performed by a 3rd Generation assay with a functional sensitivity of <=0.01 uIU/mL.  CBG monitoring, ED     Status: None   Collection Time: 03/23/17  2:39 PM  Result Value Ref Range   Glucose-Capillary 88 65 - 99 mg/dL    Blood Alcohol level:  Lab Results  Component Value Date   ETH <10 11/07/2334    Metabolic Disorder Labs:  No results found for: HGBA1C, MPG No results found for: PROLACTIN No results found for: CHOL, TRIG, HDL, CHOLHDL, VLDL,  LDLCALC  Current Medications: Current Facility-Administered Medications  Medication Dose Route Frequency Provider Last Rate Last Dose  . acetaminophen (TYLENOL) tablet 650 mg  650 mg Oral Q6H PRN Laverle Hobby, PA-C      . alum & mag hydroxide-simeth (MAALOX/MYLANTA) 200-200-20 MG/5ML suspension 30 mL  30 mL Oral Q4H PRN Patriciaann Clan E, PA-C      . chlordiazePOXIDE (LIBRIUM) capsule 25 mg  25 mg Oral Q6H PRN ,  A, MD      . chlordiazePOXIDE (LIBRIUM) capsule 25 mg  25 mg Oral QID , Laruth Bouchard, MD       Followed by  . [START ON 03/25/2017] chlordiazePOXIDE (LIBRIUM) capsule 25 mg  25 mg Oral TID Artist Beach, MD       Followed by  . [START ON 03/26/2017] chlordiazePOXIDE (LIBRIUM) capsule 25 mg  25 mg Oral BH-qamhs , Laruth Bouchard, MD  Followed by  . [START ON 03/27/2017] chlordiazePOXIDE (LIBRIUM) capsule 25 mg  25 mg Oral Daily ,  A, MD      . hydrOXYzine (ATARAX/VISTARIL) tablet 25 mg  25 mg Oral Q6H PRN , Laruth Bouchard, MD      . irbesartan (AVAPRO) tablet 150 mg  150 mg Oral Daily Money, Darnelle Maffucci B, FNP      . loperamide (IMODIUM) capsule 2-4 mg  2-4 mg Oral PRN , Laruth Bouchard, MD      . magnesium hydroxide (MILK OF MAGNESIA) suspension 30 mL  30 mL Oral Daily PRN Laverle Hobby, PA-C      . multivitamin with minerals tablet 1 tablet  1 tablet Oral Daily ,  A, MD      . ondansetron (ZOFRAN-ODT) disintegrating tablet 4 mg  4 mg Oral Q6H PRN , Laruth Bouchard, MD      . pantoprazole (PROTONIX) EC tablet 40 mg  40 mg Oral Daily Money, Travis B, FNP      . thiamine (VITAMIN B-1) tablet 100 mg  100 mg Oral Daily ,  A, MD      . traZODone (DESYREL) tablet 50 mg  50 mg Oral QHS,MR X 1 Simon, Spencer E, PA-C       PTA Medications: Medications Prior to Admission  Medication Sig Dispense Refill Last Dose  . acetaminophen (TYLENOL) 500 MG tablet Take 1,000 mg by mouth every 6 (six) hours as needed  for mild pain or headache.   Past Week at Unknown time  . azelastine (ASTELIN) 137 MCG/SPRAY nasal spray Place 2 sprays into both nostrils 2 (two) times daily as needed for rhinitis. Use in each nostril as directed   03/23/2017 at Unknown time  . buPROPion (WELLBUTRIN XL) 300 MG 24 hr tablet Take 300 mg by mouth daily.   03/23/2017 at Unknown time  . diclofenac (VOLTAREN) 75 MG EC tablet Take 75 mg by mouth 2 (two) times daily.     03/23/2017 at Unknown time  . FLUoxetine (PROZAC) 40 MG capsule Take 40 mg by mouth daily.   03/23/2017 at Unknown time  . lisinopril-hydrochlorothiazide (PRINZIDE,ZESTORETIC) 10-12.5 MG per tablet Take 1 tablet by mouth daily.   03/23/2017 at Unknown time  . montelukast (SINGULAIR) 10 MG tablet Take 10 mg by mouth at bedtime.   03/23/2017 at Unknown time  . pantoprazole (PROTONIX) 40 MG tablet Take 40 mg by mouth daily.   03/23/2017 at Unknown time  . senna (SENOKOT) 8.6 MG TABS tablet Take 1 tablet by mouth daily.   Past Month at Unknown time  . venlafaxine (EFFEXOR) 75 MG tablet Take 75 mg by mouth daily.   03/23/2017 at Unknown time  . albuterol (PROVENTIL HFA;VENTOLIN HFA) 108 (90 BASE) MCG/ACT inhaler Inhale 2 puffs into the lungs every 6 (six) hours as needed. For shortness of breath   More than a month at Unknown time  . ALPRAZolam (XANAX) 1 MG tablet Take 0.5 mg by mouth 3 (three) times daily as needed for anxiety or sleep. anxiety   03/21/2017  . traZODone (DESYREL) 50 MG tablet Take 50 mg by mouth at bedtime.   03/21/2017    Musculoskeletal: Strength & Muscle Tone: within normal limits Gait & Station: normal Patient leans: N/A  Psychiatric Specialty Exam: Physical Exam  Constitutional: She is oriented to person, place, and time. She appears well-developed and well-nourished.  HENT:  Head: Normocephalic and atraumatic.  Respiratory: Effort normal.  Neurological: She is alert and oriented to person,  place, and time.  Psychiatric:  As above    ROS   Blood pressure (!) 142/107, pulse 100, temperature 97.8 F (36.6 C), temperature source Oral, resp. rate 18, height 5' 9.25" (1.759 m), weight 124.7 kg (275 lb).Body mass index is 40.32 kg/m.  General Appearance: In hospital clothing, pleasant calm and cooperative. Good relatedness. Not in any distress.   Eye Contact:  Good  Speech: Normal rate and tone   Volume:  Normal  Mood:  Says she feels better now she has slept  Affect:  Appropriate and Full Range  Thought Process:  Linear  Orientation:  Full (Time, Place, and Person)  Thought Content:  Focused on getting better. No violent thoughts. No hallucination in any modality.   Suicidal Thoughts:  No  Homicidal Thoughts:  No  Memory:  Immediate;   Good Recent;   Good Remote;   Good  Judgement:  Good  Insight:  Good  Psychomotor Activity:  Normal  Concentration:  Concentration: Good and Attention Span: Good  Recall:  Good  Fund of Knowledge:  Good  Language:  Good  Akathisia:  Negative  Handed:    AIMS (if indicated):     Assets:  Communication Skills Desire for Improvement Housing Resilience  ADL's:  Intact  Cognition:  WNL  Sleep:  Number of Hours: 1.75    Treatment Plan Summary: Unipolar depression perpetuated by family dynamics. She is not expressing any suicidal thoughts. She is future oriented. We discussed medication adjustment as below. She  consented to treatment after we reviewed the risks and benefits respectively.  Psychiatric: MDD Recurrent  Medical: HTN IBS Gastroparesis  Psychosocial:  Taking care of her aging parents  PLAN: 1. Increase Venlafaxine XL 37.5 mg daily. Would titrate as tolerated 2. Mirtazapine 15 mg at bedtime 3. Gabapentin 100 mg BID and 400 mg at bedtime 4. Encourage unit groups and activities 5. Monitor mood, behavior and interaction with peers 6. SW would gather collateral from her family and facilitate aftercare.      Observation Level/Precautions:  15 minute checks   Laboratory:    Psychotherapy:    Medications:    Consultations:    Discharge Concerns:    Estimated LOS:  Other:     Physician Treatment Plan for Primary Diagnosis: <principal problem not specified> Long Term Goal(s): Improvement in symptoms so as ready for discharge  Short Term Goals: Ability to identify changes in lifestyle to reduce recurrence of condition will improve, Ability to verbalize feelings will improve, Ability to disclose and discuss suicidal ideas, Ability to demonstrate self-control will improve, Ability to identify and develop effective coping behaviors will improve, Ability to maintain clinical measurements within normal limits will improve and Compliance with prescribed medications will improve  Physician Treatment Plan for Secondary Diagnosis: Active Problems:   MDD (major depressive disorder), recurrent episode, severe ([place])  Long Term Goal(s): Improvement in symptoms so as ready for discharge  Short Term Goals: Ability to identify changes in lifestyle to reduce recurrence of condition will improve, Ability to verbalize feelings will improve, Ability to disclose and discuss suicidal ideas, Ability to demonstrate self-control will improve, Ability to identify and develop effective coping behaviors will improve, Ability to maintain clinical measurements within normal limits will improve and Compliance with prescribed medications will improve  I certify that inpatient services furnished can reasonably be expected to improve the patient's condition.    Artist Beach, MD 12/18/20183:13 PM

## 2017-03-24 NOTE — Progress Notes (Signed)
Recreation Therapy Notes  Animal-Assisted Activity (AAA) Program Checklist/Progress Notes Patient Eligibility Criteria Checklist & Daily Group note for Rec TxIntervention  Date: 12.18.2018 Time: 2:45pm [address]   AAA/T Program Assumption of Risk Form signed by Patient/ or Parent Legal Guardian Yes  Patient is free of allergies or sever asthma Yes  Patient reports no fear of animals Yes  Patient reports no history of cruelty to animals Yes  Patient understands his/her participation is voluntary Yes  Behavioral Response: Did not attend.   Marykay Lexenise L Zuha Dejonge, LRT/CTRS        Amaka Gluth L 03/24/2017 3:11 PM

## 2017-03-24 NOTE — Progress Notes (Addendum)
Patient ID: [name], female   DOB: [birth year], 46 y.o.   MRN: [id number]  Patient presents as tremulous at this time. Her BP has remained elevated and pulse is elevated. Patient reports a mild headache. Writer spoke with MD Izediuno about patient's presentation and Clinical research associatewriter verified that patient has a prescription that was filled on 01/05/17 for Xanax TID 0.5 mg as needed. [phone]- [phone]. Patient denies that she did not overdose on Xanax. Writer received a verbal order for Librium protocol from MD Izediuno.

## 2017-03-24 NOTE — ED Notes (Signed)
[facility] ARRIVED, PT AMBULATORY TO VEHICLE FOR D/C WITH STEADY GAIT

## 2017-03-24 NOTE — Progress Notes (Signed)
Adult Psychoeducational Group Note  Date:  03/24/2017 Time:  9:23 PM  Group Topic/Focus:  Wrap-Up Group:   The focus of this group is to help patients review their daily goal of treatment and discuss progress on daily workbooks.  Participation Level:  Active  Participation Quality:  Appropriate  Affect:  Appropriate  Cognitive:  Appropriate  Insight: Good  Engagement in Group:  Engaged  Modes of Intervention:  Activity  Additional Comments:  Patient rated her day a 10 and goal is to have a better day.  Natasha MeadKiara M Liberti Appleton 03/24/2017, 9:23 PM

## 2017-03-24 NOTE — Progress Notes (Signed)
Patient ID: [name], female   DOB: July 05, 1970, 46 y.o.   MRN: [id number]  EKG performed and tolerated well. Results shown to NP Money.

## 2017-03-24 NOTE — Tx Team (Addendum)
Initial Treatment Plan 03/24/2017 3:31 AM [name] [id number]    PATIENT STRESSORS: Marital or family conflict   PATIENT STRENGTHS: Average or above average intelligence Capable of independent living Communication skills General fund of knowledge Motivation for treatment/growth Physical Health Supportive family/friends   PATIENT IDENTIFIED PROBLEMS: "Just being able to handle a situation whether walking away or meditating, to deal with stress factors better"    Increased risk for suicide  Depression               DISCHARGE CRITERIA:  Ability to meet basic life and health needs Adequate post-discharge living arrangements Improved stabilization in mood, thinking, and/or behavior Medical problems require only outpatient monitoring Motivation to continue treatment in a less acute level of care Need for constant or close observation no longer present Reduction of life-threatening or endangering symptoms to within safe limits Safe-care adequate arrangements made Verbal commitment to aftercare and medication compliance  PRELIMINARY DISCHARGE PLAN: Outpatient therapy Return to previous living arrangement  PATIENT/FAMILY INVOLVEMENT: This treatment plan has been presented to and reviewed with the patient, [name], and/or family member.  The patient and family have been given the opportunity to ask questions and make suggestions.  Doristine JohnsBrooks, Tyhir Schwan Laverne, RN 03/24/2017, 3:31 AM
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
## 2017-03-24 NOTE — Progress Notes (Signed)
Patient ID: [name], female   DOB: 10-Apr-1970, 46 y.o.   MRN: [id number]  DAR: Pt. Denies SI/HI and A/V Hallucinations. She reports that her sleep was poor, her appetite is poor, her energy level is low, and her concentration is good. She rates her depression level 1/10, her hopelessness level 0/10, and her anxiety level 0/10. Patient does report some left sided pain related to her fall PTA and a mild headache which she received Tylenol for. Upon reassessment she reports some relief. Support and encouragement provided to the patient. Scheduled medications administered to patient per physician's orders. Patient is receptive and cooperative. She is seen in the milieu and interacting with peers. Q15 minute checks are maintained for safety.

## 2017-03-24 NOTE — ED Notes (Signed)
PATIENT HAS HOME MEDICATIONS LOCKED UP IN THE PHARMACY.

## 2017-03-24 NOTE — Progress Notes (Signed)
Patient ID: [name], female   DOB: 04/05/1971, 46 y.o.   MRN: [id number]  Patient signed a 72 hour request for discharge on 03/24/17 at 1612.

## 2017-03-24 NOTE — Progress Notes (Signed)
Pt was flat depressed and also apprehensive when answering questions during the adm process. Denied having any major stressors when answering questions on the treatment plan, however when initially asked about the circumstances surrounding her adm pt stated, "Just being stressed out and over whelmed. Everything that's going on at the house and just had a complete breakdown". Pt denied SI, HI, and A/V/H during the adm. Per report pt ingested 80+ benadryl. Insisted that she wasn't attempting to kill herself, but wanted to sleep".  Pt has no questions or concerns.

## 2017-03-24 NOTE — Progress Notes (Signed)
Patient ID: [name], female   DOB: [birth year], 46 y.o.   MRN: [id number]  D: Patient pleasant and cooperative with care this shift and is noted to interact well with peers in the milieu. Pt with no noted confusion. Pt states she has a lot of stress in her life. Pt states she is worried about both of her aging parents who each need full time care. Pt states "I am just stretched too thin and I have a lot on my plate". A: Q 15 minute safety checks, encourage staff/peer interaction and group participation. Administer medications as ordered. R: Pt compliant with medications and attended group session. Pt denies SI at this time and verbally contracts for safety. No signs/symptoms of distress noted.

## 2017-03-24 NOTE — Plan of Care (Signed)
  Progressing Education: Knowledge of [facility] Education information/materials will improve 03/24/2017 1158 - Progressing by Lenord Fellersopson, Durene Dodge Elizabeth, RN

## 2017-03-25 DIAGNOSIS — Z87891 Personal history of nicotine dependence: Secondary | ICD-10-CM

## 2017-03-25 DIAGNOSIS — G4700 Insomnia, unspecified: Secondary | ICD-10-CM

## 2017-03-25 DIAGNOSIS — F39 Unspecified mood [affective] disorder: Secondary | ICD-10-CM

## 2017-03-25 NOTE — Progress Notes (Signed)
[facility] MD Progress Note  03/25/2017 3:41 PM [name]  MRN:  [id number]   Subjective:  Patient reports "That was the best night of sleep I have gotten in a really long time." She reports that this is the best she has felt, "I feel normal. I don't feel jittery or any of that stuff I had when I came in." She denies any SI/HI/AVH and contracts for safety.   Objective: Patient's chart and findings reviewed and discussed with treatment team. Patient has been in the day room interacting with peers and staff appropriately. Patient has been attending groups and participating.     Principal Problem: MDD (major depressive disorder), recurrent episode, severe (HCC) Diagnosis:   Patient Active Problem List   Diagnosis Date Noted  . MDD (major depressive disorder), recurrent episode, severe (HCC) [F33.2] 03/24/2017  . Symptomatic mammary hypertrophy [N62] 01/09/2016  . Nicotine dependence [F17.200] 12/25/2015   Total Time spent with patient: 15 minutes  Past Psychiatric History: See H&P  Past Medical History:  Past Medical History:  Diagnosis Date  . Anxiety   . Arthritis   . Asthma   . Depression   . Endometriosis t  . Gastric bypass status for obesity t  . GERD (gastroesophageal reflux disease) t  . Hernia, diaphragmatic t  . Hx of hysterectomy t  . Hypertension   . PONV (postoperative nausea and vomiting)   . S/P cholecystectomy t    Past Surgical History:  Procedure Laterality Date  . ABDOMINAL HYSTERECTOMY    . ABDOMINAL SURGERY    . BREAST REDUCTION SURGERY Bilateral 01/09/2016   Procedure: MAMMARY REDUCTION  (BREAST) BILATERAL;  Surgeon: Peggye Form, DO;  Location: [facility];  Service: Plastics;  Laterality: Bilateral;  . CHOLECYSTECTOMY    . HERNIA REPAIR     hiatal hernia x3, abd hernia  . HIATAL HERNIA REPAIR     was done with lapband on two occasions.  [employer] mesh was used and removed  . lap band removal    . LAPAROSCOPIC GASTRIC BANDING   2005   was done with hiatus hernia repair.  redone in 2006   . UMBILICAL HERNIA REPAIR  01/10/11   emergency hernia repair due to hernia twisted in intestines    Family History:  Family History  Problem Relation Age of Onset  . Cancer Maternal Aunt        melanoma  . Diabetes Father   . Heart attack Father    Family Psychiatric  History: See H&P Social History:  Social History   Substance and Sexual Activity  Alcohol Use No     Social History   Substance and Sexual Activity  Drug Use No    Social History   Socioeconomic History  . Marital status: Legally Separated    Spouse name: None  . Number of children: None  . Years of education: None  . Highest education level: None  Social Needs  . Financial resource strain: None  . Food insecurity - worry: None  . Food insecurity - inability: None  . Transportation needs - medical: None  . Transportation needs - non-medical: None  Occupational History  . None  Tobacco Use  . Smoking status: Former Smoker    Packs/day: 0.25    Types: Cigarettes    Last attempt to quit: 05/20/2015    Years since quitting: 1.8  . Smokeless tobacco: Never Used  Substance and Sexual Activity  . Alcohol use: No  . Drug  use: No  . Sexual activity: Yes    Birth control/protection: Surgical  Other Topics Concern  . None  Social History Narrative  . None   Additional Social History:    Pain Medications: See MAR Prescriptions: See MAR Over the Counter: See MAR History of alcohol / drug use?: No history of alcohol / drug abuse                    Sleep: Good  Appetite:  Good  Current Medications: Current Facility-Administered Medications  Medication Dose Route Frequency Provider Last Rate Last Dose  . acetaminophen (TYLENOL) tablet 650 mg  650 mg Oral Q6H PRN Kerry HoughSimon, Spencer E, PA-C   650 mg at 03/24/17 1604  . alum & mag hydroxide-simeth (MAALOX/MYLANTA) 200-200-20 MG/5ML suspension 30 mL  30 mL Oral Q4H PRN Donell SievertSimon, Spencer E,  PA-C      . chlordiazePOXIDE (LIBRIUM) capsule 25 mg  25 mg Oral Q6H PRN Izediuno, Vincent A, MD      . gabapentin (NEURONTIN) capsule 100 mg  100 mg Oral BID Georgiann CockerIzediuno, Vincent A, MD   100 mg at 03/25/17 0806  . gabapentin (NEURONTIN) capsule 400 mg  400 mg Oral QHS Izediuno, Delight OvensVincent A, MD   400 mg at 03/24/17 2131  . irbesartan (AVAPRO) tablet 150 mg  150 mg Oral Daily Alano Blasco, Gerlene Burdockravis B, FNP   150 mg at 03/25/17 0806  . loperamide (IMODIUM) capsule 2-4 mg  2-4 mg Oral PRN Izediuno, Delight OvensVincent A, MD      . magnesium hydroxide (MILK OF MAGNESIA) suspension 30 mL  30 mL Oral Daily PRN Donell SievertSimon, Spencer E, PA-C      . mirtazapine (REMERON) tablet 15 mg  15 mg Oral QHS Izediuno, Delight OvensVincent A, MD   15 mg at 03/24/17 2131  . multivitamin with minerals tablet 1 tablet  1 tablet Oral Daily Georgiann CockerIzediuno, Vincent A, MD   1 tablet at 03/25/17 0806  . nicotine polacrilex (NICORETTE) gum 2 mg  2 mg Oral PRN Georgiann CockerIzediuno, Vincent A, MD   2 mg at 03/25/17 0808  . ondansetron (ZOFRAN-ODT) disintegrating tablet 4 mg  4 mg Oral Q6H PRN Izediuno, Vincent A, MD      . pantoprazole (PROTONIX) EC tablet 40 mg  40 mg Oral Daily Tyreisha Ungar, Gerlene Burdockravis B, FNP   40 mg at 03/25/17 0806  . traZODone (DESYREL) tablet 50 mg  50 mg Oral QHS,MR X 1 Kerry HoughSimon, Spencer E, PA-C   50 mg at 03/24/17 2131  . venlafaxine XR (EFFEXOR-XR) 24 hr capsule 75 mg  75 mg Oral Daily Izediuno, Delight OvensVincent A, MD   75 mg at 03/25/17 [id number]    Lab Results: No results found for this or any previous visit (from the past 48 hour(s)).  Blood Alcohol level:  Lab Results  Component Value Date   ETH <10 03/23/2017    Metabolic Disorder Labs: No results found for: HGBA1C, MPG No results found for: PROLACTIN No results found for: CHOL, TRIG, HDL, CHOLHDL, VLDL, LDLCALC  Physical Findings: AIMS: Facial and Oral Movements Muscles of Facial Expression: None, normal Lips and Perioral Area: None, normal Jaw: None, normal Tongue: None, normal,Extremity Movements Upper (arms, wrists,  hands, fingers): None, normal Lower (legs, knees, ankles, toes): None, normal, Trunk Movements Neck, shoulders, hips: None, normal, Overall Severity Severity of abnormal movements (highest score from questions above): None, normal Incapacitation due to abnormal movements: None, normal Patient's awareness of abnormal movements (rate only patient's report): No Awareness, Dental Status Current problems  with teeth and/or dentures?: No Does patient usually wear dentures?: No  CIWA:  CIWA-Ar Total: 1 COWS:     Musculoskeletal: Strength & Muscle Tone: within normal limits Gait & Station: normal Patient leans: N/A  Psychiatric Specialty Exam: Physical Exam  Nursing note and vitals reviewed. Constitutional: She is oriented to person, place, and time. She appears well-developed and well-nourished.  Respiratory: Effort normal.  Musculoskeletal: Normal range of motion.  Neurological: She is alert and oriented to person, place, and time.  Skin: Skin is warm.    Review of Systems  Constitutional: Negative.   HENT: Negative.   Eyes: Negative.   Respiratory: Negative.   Cardiovascular: Negative.   Gastrointestinal: Negative.   Genitourinary: Negative.   Musculoskeletal: Negative.   Skin: Negative.   Neurological: Negative.   Endo/Heme/Allergies: Negative.   Psychiatric/Behavioral: Negative.     Blood pressure 114/81, pulse (!) 129, temperature 98.1 F (36.7 C), temperature source Oral, resp. rate 18, height 5' 9.25" (1.759 m), weight 124.7 kg (275 lb).Body mass index is 40.32 kg/m.  General Appearance: Casual  Eye Contact:  Good  Speech:  Clear and Coherent and Normal Rate  Volume:  Normal  Mood:  Euthymic  Affect:  Appropriate  Thought Process:  Goal Directed and Descriptions of Associations: Intact  Orientation:  Full (Time, Place, and Person)  Thought Content:  WDL  Suicidal Thoughts:  No  Homicidal Thoughts:  No  Memory:  Immediate;   Good Recent;   Good Remote;   Good   Judgement:  Good  Insight:  Good  Psychomotor Activity:  Normal  Concentration:  Concentration: Good and Attention Span: Good  Recall:  Good  Fund of Knowledge:  Good  Language:  Good  Akathisia:  No  Handed:  Right  AIMS (if indicated):     Assets:  Communication Skills Desire for Improvement Financial Resources/Insurance Housing Social Support  ADL's:  Intact  Cognition:  WNL  Sleep:  Number of Hours: 6.75   Problems Addressed: MDD severe  Treatment Plan Summary: Daily contact with patient to assess and evaluate symptoms and progress in treatment, Medication management and Plan is to:  -Continue Effexor-XR 75 mg PO Daily for mood stability -Continue Trazodone 50 mg PO QHS PRN for insomnia -Continue Remeron 15 mg PO QHS for mood stability -Continue Gabapentin 400 mg PO QHS  -Continue Gabapentin 100 mg PO BID for withdrawal symptoms -Encourage group therapy participation  Maryfrances Bunnellravis B Morgane Joerger, FNP 03/25/2017, 3:41 PM

## 2017-03-25 NOTE — Progress Notes (Signed)
Patient ID: [name], female   DOB: April 30, 1970, 46 y.o.   MRN: [id number]  Pt currently presents with a flat affect and cooperative behavior. Pt remains in the dayroom, interacts minimally with peers. Pt reports good sleep with current medication regimen. Refused her Remeron. States "I sleep like a baby, I already told the doctor I don't need that." Pt trending tachycardic, currently asymptomatic.   Pt provided with medications per providers orders. Pt's labs and vitals were monitored throughout the night. Pt given a 1:1 about emotional and mental status. Pt supported and encouraged to express concerns and questions. Pt educated on medications.  Pt's safety ensured with 15 minute and environmental checks. Pt currently denies SI/HI and A/V hallucinations. Pt verbally agrees to seek staff if SI/HI or A/VH occurs and to consult with staff before acting on any harmful thoughts. Will continue POC.

## 2017-03-25 NOTE — Tx Team (Signed)
Interdisciplinary Treatment and Diagnostic Plan Update  03/25/2017 Time of Session: [address] [name] MRN: [id number]  Principal Diagnosis: MDD (major depressive disorder), recurrent episode, severe (HCC)  Secondary Diagnoses: Principal Problem:   MDD (major depressive disorder), recurrent episode, severe (HCC)   Current Medications:  Current Facility-Administered Medications  Medication Dose Route Frequency Provider Last Rate Last Dose  . acetaminophen (TYLENOL) tablet 650 mg  650 mg Oral Q6H PRN Kerry Hough, PA-C   650 mg at 03/24/17 1604  . alum & mag hydroxide-simeth (MAALOX/MYLANTA) 200-200-20 MG/5ML suspension 30 mL  30 mL Oral Q4H PRN Donell Sievert E, PA-C      . chlordiazePOXIDE (LIBRIUM) capsule 25 mg  25 mg Oral Q6H PRN Izediuno, Vincent A, MD      . gabapentin (NEURONTIN) capsule 100 mg  100 mg Oral BID Georgiann Cocker, MD   100 mg at 03/25/17 0806  . gabapentin (NEURONTIN) capsule 400 mg  400 mg Oral QHS Izediuno, Delight Ovens, MD   400 mg at 03/24/17 2131  . irbesartan (AVAPRO) tablet 150 mg  150 mg Oral Daily Money, Gerlene Burdock, FNP   150 mg at 03/25/17 0806  . loperamide (IMODIUM) capsule 2-4 mg  2-4 mg Oral PRN Izediuno, Delight Ovens, MD      . magnesium hydroxide (MILK OF MAGNESIA) suspension 30 mL  30 mL Oral Daily PRN Donell Sievert E, PA-C      . mirtazapine (REMERON) tablet 15 mg  15 mg Oral QHS Izediuno, Delight Ovens, MD   15 mg at 03/24/17 2131  . multivitamin with minerals tablet 1 tablet  1 tablet Oral Daily Georgiann Cocker, MD   1 tablet at 03/25/17 0806  . nicotine polacrilex (NICORETTE) gum 2 mg  2 mg Oral PRN Georgiann Cocker, MD   2 mg at 03/25/17 0808  . ondansetron (ZOFRAN-ODT) disintegrating tablet 4 mg  4 mg Oral Q6H PRN Izediuno, Vincent A, MD      . pantoprazole (PROTONIX) EC tablet 40 mg  40 mg Oral Daily Money, Gerlene Burdock, FNP   40 mg at 03/25/17 0806  . traZODone (DESYREL) tablet 50 mg  50 mg Oral QHS,MR X 1 Kerry Hough, PA-C   50 mg at  03/24/17 2131  . venlafaxine XR (EFFEXOR-XR) 24 hr capsule 75 mg  75 mg Oral Daily Izediuno, Delight Ovens, MD   75 mg at 03/25/17 0806   PTA Medications: Medications Prior to Admission  Medication Sig Dispense Refill Last Dose  . acetaminophen (TYLENOL) 500 MG tablet Take 1,000 mg by mouth every 6 (six) hours as needed for mild pain or headache.   Past Week at Unknown time  . azelastine (ASTELIN) 137 MCG/SPRAY nasal spray Place 2 sprays into both nostrils 2 (two) times daily as needed for rhinitis. Use in each nostril as directed   03/23/2017 at Unknown time  . buPROPion (WELLBUTRIN XL) 300 MG 24 hr tablet Take 300 mg by mouth daily.   03/23/2017 at Unknown time  . diclofenac (VOLTAREN) 75 MG EC tablet Take 75 mg by mouth 2 (two) times daily.     03/23/2017 at Unknown time  . FLUoxetine (PROZAC) 40 MG capsule Take 40 mg by mouth daily.   03/23/2017 at Unknown time  . lisinopril-hydrochlorothiazide (PRINZIDE,ZESTORETIC) 10-12.5 MG per tablet Take 1 tablet by mouth daily.   03/23/2017 at Unknown time  . montelukast (SINGULAIR) 10 MG tablet Take 10 mg by mouth at bedtime.   03/23/2017 at Unknown time  .  pantoprazole (PROTONIX) 40 MG tablet Take 40 mg by mouth daily.   03/23/2017 at Unknown time  . senna (SENOKOT) 8.6 MG TABS tablet Take 1 tablet by mouth daily.   Past Month at Unknown time  . venlafaxine (EFFEXOR) 75 MG tablet Take 75 mg by mouth daily.   03/23/2017 at Unknown time  . albuterol (PROVENTIL HFA;VENTOLIN HFA) 108 (90 BASE) MCG/ACT inhaler Inhale 2 puffs into the lungs every 6 (six) hours as needed. For shortness of breath   More than a month at Unknown time  . ALPRAZolam (XANAX) 1 MG tablet Take 0.5 mg by mouth 3 (three) times daily as needed for anxiety or sleep. anxiety   03/21/2017  . traZODone (DESYREL) 50 MG tablet Take 50 mg by mouth at bedtime.   03/21/2017    Patient Stressors: Marital or family conflict  Patient Strengths: Average or above average intelligence Capable of  independent living Communication skills General fund of knowledge Motivation for treatment/growth Physical Health Supportive family/friends  Treatment Modalities: Medication Management, Group therapy, Case management,  1 to 1 session with clinician, Psychoeducation, Recreational therapy.   Physician Treatment Plan for Primary Diagnosis: MDD (major depressive disorder), recurrent episode, severe (HCC) Long Term Goal(s): Improvement in symptoms so as ready for discharge Improvement in symptoms so as ready for discharge   Short Term Goals: Ability to identify changes in lifestyle to reduce recurrence of condition will improve Ability to verbalize feelings will improve Ability to disclose and discuss suicidal ideas Ability to demonstrate self-control will improve Ability to identify and develop effective coping behaviors will improve Ability to maintain clinical measurements within normal limits will improve Compliance with prescribed medications will improve Ability to identify changes in lifestyle to reduce recurrence of condition will improve Ability to verbalize feelings will improve Ability to disclose and discuss suicidal ideas Ability to demonstrate self-control will improve Ability to identify and develop effective coping behaviors will improve Ability to maintain clinical measurements within normal limits will improve Compliance with prescribed medications will improve  Medication Management: Evaluate patient's response, side effects, and tolerance of medication regimen.  Therapeutic Interventions: 1 to 1 sessions, Unit Group sessions and Medication administration.  Evaluation of Outcomes: Progressing  Physician Treatment Plan for Secondary Diagnosis: Principal Problem:   MDD (major depressive disorder), recurrent episode, severe (HCC)  Long Term Goal(s): Improvement in symptoms so as ready for discharge Improvement in symptoms so as ready for discharge   Short Term  Goals: Ability to identify changes in lifestyle to reduce recurrence of condition will improve Ability to verbalize feelings will improve Ability to disclose and discuss suicidal ideas Ability to demonstrate self-control will improve Ability to identify and develop effective coping behaviors will improve Ability to maintain clinical measurements within normal limits will improve Compliance with prescribed medications will improve Ability to identify changes in lifestyle to reduce recurrence of condition will improve Ability to verbalize feelings will improve Ability to disclose and discuss suicidal ideas Ability to demonstrate self-control will improve Ability to identify and develop effective coping behaviors will improve Ability to maintain clinical measurements within normal limits will improve Compliance with prescribed medications will improve     Medication Management: Evaluate patient's response, side effects, and tolerance of medication regimen.  Therapeutic Interventions: 1 to 1 sessions, Unit Group sessions and Medication administration.  Evaluation of Outcomes: Progressing   RN Treatment Plan for Primary Diagnosis: MDD (major depressive disorder), recurrent episode, severe (HCC) Long Term Goal(s): Knowledge of disease and therapeutic regimen to maintain health  will improve  Short Term Goals: Ability to identify and develop effective coping behaviors will improve and Compliance with prescribed medications will improve  Medication Management: RN will administer medications as ordered by provider, will assess and evaluate patient's response and provide education to patient for prescribed medication. RN will report any adverse and/or side effects to prescribing provider.  Therapeutic Interventions: 1 on 1 counseling sessions, Psychoeducation, Medication administration, Evaluate responses to treatment, Monitor vital signs and CBGs as ordered, Perform/monitor CIWA, COWS, AIMS and  Fall Risk screenings as ordered, Perform wound care treatments as ordered.  Evaluation of Outcomes: Progressing   LCSW Treatment Plan for Primary Diagnosis: MDD (major depressive disorder), recurrent episode, severe (HCC) Long Term Goal(s): Safe transition to appropriate next level of care at discharge, Engage patient in therapeutic group addressing interpersonal concerns.  Short Term Goals: Engage patient in aftercare planning with referrals and resources, Increase social support and Increase skills for wellness and recovery  Therapeutic Interventions: Assess for all discharge needs, 1 to 1 time with Social worker, Explore available resources and support systems, Assess for adequacy in community support network, Educate family and significant other(s) on suicide prevention, Complete Psychosocial Assessment, Interpersonal group therapy.  Evaluation of Outcomes: Progressing   Progress in Treatment: Attending groups: No. Participating in groups: No. Taking medication as prescribed: Yes. Toleration medication: Yes. Family/Significant other contact made: No, will contact:  mother Patient understands diagnosis: Yes. Discussing patient identified problems/goals with staff: Yes. Medical problems stabilized or resolved: Yes. Denies suicidal/homicidal ideation: Yes. Issues/concerns per patient self-inventory: No. Other: none  New problem(s) identified: No, Describe:  none  New Short Term/Long Term Goal(s):  Discharge Plan or Barriers:   Reason for Continuation of Hospitalization: Depression Medication stabilization  Estimated Length of Stay: 3-5 days.  Attendees: Patient: 03/25/2017   Physician: Dr Tawni MillersIzediunu, MD 03/25/2017   Nursing: Joslyn Devonaroline Beaudry, RN 03/25/2017   RN Care Manager: 03/25/2017   Social Worker: Daleen SquibbGreg Zaiah Credeur, LCSW 03/25/2017   Recreational Therapist:  03/25/2017   Other:  03/25/2017   Other:  03/25/2017   Other: 03/25/2017       Scribe for Treatment  Team: Lorri FrederickWierda, Adain Geurin Jon, LCSW 03/25/2017 4:04 PM

## 2017-03-25 NOTE — Progress Notes (Signed)
Patient ID: [name], female   DOB: 10/22/1970, 46 y.o.   MRN: [id number]  Patient reports that her mother is at home looking for patient's purse and she cannot find it. [facility] to see if any of patient's items were left. The ED does not report having any of those items. Patient was notified.

## 2017-03-25 NOTE — Progress Notes (Signed)
Patient ID: [name], female   DOB: 1971/03/02, 46 y.o.   MRN: [id number]  MD Izediuno and NP Money notified of patient's trending elevated pulse during treatment team this morning (approximately 0900).

## 2017-03-25 NOTE — Progress Notes (Signed)
Recreation Therapy Notes  Date: 03/25/17 Time: 0930 Location: [address] Dayroom  Group Topic: Stress Management  Goal Area(s) Addresses:  Patient will verbalize importance of using healthy stress management.  Patient will identify positive emotions associated with healthy stress management.   Intervention: Stress Management  Activity : Guided Imagery.  LRT introduced the stress management technique of guided imagery.  Patients were to listen and follow along as LRT read script to fully engage in the technique.  Education:  Stress Management, Discharge Planning.   Education Outcome: Acknowledges edcuation/In group clarification offered/Needs additional education  Clinical Observations/Feedback: Pt did not attend group.    Caroll RancherMarjette Antigone Crowell, LRT/CTRS         Caroll RancherLindsay, Jamila Slatten A 03/25/2017 12:44 PM

## 2017-03-25 NOTE — Plan of Care (Signed)
  Progressing Activity: Sleeping patterns will improve 03/25/2017 1409 - Progressing by Lenord Fellersopson, Cornelis Kluver Elizabeth, RN Medication: Compliance with prescribed medication regimen will improve 03/25/2017 1409 - Progressing by Lenord Fellersopson, Promise Weldin Elizabeth, RN Health Behavior/Discharge Planning: Compliance with therapeutic regimen will improve 03/25/2017 1409 - Progressing by Lenord Fellersopson, Allenmichael Mcpartlin Elizabeth, RN

## 2017-03-25 NOTE — [facility] Suicide Risk Assessment (Signed)
[facility] INPATIENT:  Family/Significant Other Suicide Prevention Education  Suicide Prevention Education:  Education Completed; Raenette RoverRuth Duggins, mother, [phone],  has been identified by the patient as the family member/significant other with whom the patient will be residing, and identified as the person(s) who will aid the patient in the event of a mental health crisis (suicidal ideations/suicide attempt).  With written consent from the patient, the family member/significant other has been provided the following suicide prevention education, prior to the and/or following the discharge of the patient.  The suicide prevention education provided includes the following:  Suicide risk factors  Suicide prevention and interventions  National Suicide Hotline telephone number  [facility] assessment telephone number  [facility] 911  [facility] and/or [facility]idential Mobile Crisis Unit telephone number  Request made of family/significant other to:  Remove weapons (e.g., guns, rifles, knives), all items previously/currently identified as safety concern.  No guns in the home, per ruth.  Remove drugs/medications (over-the-counter, prescriptions, illicit drugs), all items previously/currently identified as a safety concern. Windell MouldingRuth has already locked up medications in the home.  The family member/significant other verbalizes understanding of the suicide prevention education information provided.  The family member/significant other agrees to remove the items of safety concern listed above.  Windell MouldingRuth will be leaving town early Friday and won't return until after Christmas.    Lorri FrederickWierda, Mahasin Riviere Jon, LCSW 03/25/2017, 4:38 PM

## 2017-03-25 NOTE — Progress Notes (Signed)
Adult Psychoeducational Group Note  Date:  03/25/2017 Time:  11:17 PM  Group Topic/Focus:  Wrap-Up Group:   The focus of this group is to help patients review their daily goal of treatment and discuss progress on daily workbooks.  Participation Level:  Active  Participation Quality:  Appropriate  Affect:  Appropriate  Cognitive:  Appropriate  Insight: Appropriate  Engagement in Group:  Engaged  Modes of Intervention:  Discussion  Additional Comments:  Patient attended group and said that her day was a 10.  Her goal for today was to have a good day and she did. Tomorrow she wishes to discuss her discharge plan with her physician.   Shahram Alexopoulos W Sheridan Gettel 03/25/2017, 11:17 PM

## 2017-03-25 NOTE — Progress Notes (Signed)
Patient ID: [name], female   DOB: Apr 07, 1971, 46 y.o.   MRN: [id number]  DAR: Pt. Denies SI/HI and A/V Hallucinations. She reports that her sleep last night was very well. She states, "I slept better than I have in a long time." She reports that her appetite is fair, her energy level is normal, and her concentration level is good.Patient does report som pain and received PRN Tylenol. She is also utilizing her PRN order for Nicorette. Support and encouragement provided to the patient. Scheduled medication administered to patient per physician's orders. Patient is receptive and cooperative. She is seen in the milieu and is interacting with her peers. Q15 minute checks are maintained for safety.

## 2017-03-26 NOTE — [facility] Group Notes (Signed)
[facility] 03/26/2017 1:15pm  Type of Therapy: Mental [employer] Presentation  Participation Level: Active  Participation Quality: Attentive  Affect: Appropriate  Cognitive: Oriented  Insight: Developing/Improving  Engagement in Therapy: Engaged  Modes of Intervention: Discussion, Education and Socialization  Summary of Progress/Problems: Mental [employer] Association (MHA) Speaker came to talk about his personal journey with mental health. The pt processed ways by which to relate to the speaker. MHA speaker provided handouts and educational information pertaining to groups and services offered by the [facility]. Pt was engaged in speaker's presentation and was receptive to resources provided.    Lorri FrederickWierda, [name] Jon, LCSW 03/26/2017 3:03 PM

## 2017-03-26 NOTE — Progress Notes (Signed)
[facility] MD Progress Note  03/26/2017 4:59 PM [name]  MRN:  [id number] Subjective:   46 y.o Caucasian female, separated, lives wit her mother and God son. Background history of MDD and anxiety disorder. Presented to the ER via emergency services. She presented with altered mental status. She overdosed on forty pills of Benadryl. She sent a friend text " I'm dying". Her mother heard her fall in the house. She had clonic tonic seizure activity at home. At the ER, patient regreted suicidal behavior. She denied any intent to kill herself. She reported being stressed by family demands. She takes care of her ailing parents and her God son.  Routine labs are significant for stable elevated Hct, mildly elevated WBCC. Toxicology is negative,  UDS is positive for benzodiazepines. No alcohol or substance use.   Chart reviewed today. Patient discussed at team today. Reported to be doing well. Team feels she is ready for discharge soon.  Staff reports she has been appropriate. She has been interacting well with peers. No behavioral issues. Not expressing any suicidal thoughts. Normal biological functions.  Seen today. Tells me that she feels like a new person. Says she is much calmer and relaxed. Says she is now sleeping well at night. She is no longer feeling depressed. Says her God son and mother came to visit. She feels well supported. Patient says she has gathered a lot of coping skills here. She feels she is ready for home. Says she does not feel anxious or overwhelmed as she felt before she came in. Says her step mother has at most four months to live. Says her parents divorced when she was [age] years of age. She had been conflicted on taking her father in with her mom as he is getting demented. Says her father had told her before he had the stroke that he plans to go into a nursing home if he losses his mind. Patient says she feels at peace making that decision. Patient denies any other stressors. She is  tolerating her medication well. No lingering suicidal thoughts. No evidence of mania. No evidence of psychosis.   Principal Problem: MDD (major depressive disorder), recurrent episode, severe (HCC) Diagnosis:   Patient Active Problem List   Diagnosis Date Noted  . MDD (major depressive disorder), recurrent episode, severe (HCC) [F33.2] 03/24/2017  . Symptomatic mammary hypertrophy [N62] 01/09/2016  . Nicotine dependence [F17.200] 12/25/2015   Total Time spent with patient: 30 minutes  Past Psychiatric History: As in H&P  Past Medical History:  Past Medical History:  Diagnosis Date  . Anxiety   . Arthritis   . Asthma   . Depression   . Endometriosis t  . Gastric bypass status for obesity t  . GERD (gastroesophageal reflux disease) t  . Hernia, diaphragmatic t  . Hx of hysterectomy t  . Hypertension   . PONV (postoperative nausea and vomiting)   . S/P cholecystectomy t    Past Surgical History:  Procedure Laterality Date  . ABDOMINAL HYSTERECTOMY    . ABDOMINAL SURGERY    . BREAST REDUCTION SURGERY Bilateral 01/09/2016   Procedure: MAMMARY REDUCTION  (BREAST) BILATERAL;  Surgeon: Peggye Form, DO;  Location: [facility];  Service: Plastics;  Laterality: Bilateral;  . CHOLECYSTECTOMY    . HERNIA REPAIR     hiatal hernia x3, abd hernia  . HIATAL HERNIA REPAIR     was done with lapband on two occasions.  [employer] mesh was used and removed  .  lap band removal    . LAPAROSCOPIC GASTRIC BANDING  2005   was done with hiatus hernia repair.  redone in 2006   . UMBILICAL HERNIA REPAIR  01/10/11   emergency hernia repair due to hernia twisted in intestines    Family History:  Family History  Problem Relation Age of Onset  . Cancer Maternal Aunt        melanoma  . Diabetes Father   . Heart attack Father    Family Psychiatric  History: As in H&P Social History:  Social History   Substance and Sexual Activity  Alcohol Use No     Social History    Substance and Sexual Activity  Drug Use No    Social History   Socioeconomic History  . Marital status: Legally Separated    Spouse name: None  . Number of children: None  . Years of education: None  . Highest education level: None  Social Needs  . Financial resource strain: None  . Food insecurity - worry: None  . Food insecurity - inability: None  . Transportation needs - medical: None  . Transportation needs - non-medical: None  Occupational History  . None  Tobacco Use  . Smoking status: Former Smoker    Packs/day: 0.25    Types: Cigarettes    Last attempt to quit: 05/20/2015    Years since quitting: 1.8  . Smokeless tobacco: Never Used  Substance and Sexual Activity  . Alcohol use: No  . Drug use: No  . Sexual activity: Yes    Birth control/protection: Surgical  Other Topics Concern  . None  Social History Narrative  . None   Additional Social History:    Pain Medications: See MAR Prescriptions: See MAR Over the Counter: See MAR History of alcohol / drug use?: No history of alcohol / drug abuse                    Sleep: Good  Appetite:  Good  Current Medications: Current Facility-Administered Medications  Medication Dose Route Frequency Provider Last Rate Last Dose  . acetaminophen (TYLENOL) tablet 650 mg  650 mg Oral Q6H PRN Kerry Hough, PA-C   650 mg at 03/25/17 1637  . alum & mag hydroxide-simeth (MAALOX/MYLANTA) 200-200-20 MG/5ML suspension 30 mL  30 mL Oral Q4H PRN Donell Sievert E, PA-C      . chlordiazePOXIDE (LIBRIUM) capsule 25 mg  25 mg Oral Q6H PRN Izediuno, Vincent A, MD      . gabapentin (NEURONTIN) capsule 100 mg  100 mg Oral BID Izediuno, Delight Ovens, MD   100 mg at 03/26/17 1641  . gabapentin (NEURONTIN) capsule 400 mg  400 mg Oral QHS Izediuno, Delight Ovens, MD   400 mg at 03/25/17 2120  . irbesartan (AVAPRO) tablet 150 mg  150 mg Oral Daily Money, Gerlene Burdock, FNP   150 mg at 03/26/17 0754  . loperamide (IMODIUM) capsule 2-4 mg   2-4 mg Oral PRN Izediuno, Delight Ovens, MD      . magnesium hydroxide (MILK OF MAGNESIA) suspension 30 mL  30 mL Oral Daily PRN Donell Sievert E, PA-C      . mirtazapine (REMERON) tablet 15 mg  15 mg Oral QHS Izediuno, Delight Ovens, MD   15 mg at 03/24/17 2131  . multivitamin with minerals tablet 1 tablet  1 tablet Oral Daily Georgiann Cocker, MD   1 tablet at 03/26/17 0754  . nicotine polacrilex (NICORETTE) gum 2 mg  2  mg Oral PRN Georgiann CockerIzediuno, Vincent A, MD   2 mg at 03/25/17 1637  . ondansetron (ZOFRAN-ODT) disintegrating tablet 4 mg  4 mg Oral Q6H PRN Izediuno, Vincent A, MD      . pantoprazole (PROTONIX) EC tablet 40 mg  40 mg Oral Daily Money, Gerlene Burdockravis B, FNP   40 mg at 03/26/17 0754  . traZODone (DESYREL) tablet 50 mg  50 mg Oral QHS,MR X 1 Kerry HoughSimon, Spencer E, PA-C   50 mg at 03/25/17 2120  . venlafaxine XR (EFFEXOR-XR) 24 hr capsule 75 mg  75 mg Oral Daily Izediuno, Vincent A, MD   75 mg at 03/26/17 [id number]    Lab Results: No results found for this or any previous visit (from the past 48 hour(s)).  Blood Alcohol level:  Lab Results  Component Value Date   ETH <10 03/23/2017    Metabolic Disorder Labs: No results found for: HGBA1C, MPG No results found for: PROLACTIN No results found for: CHOL, TRIG, HDL, CHOLHDL, VLDL, LDLCALC  Physical Findings: AIMS: Facial and Oral Movements Muscles of Facial Expression: None, normal Lips and Perioral Area: None, normal Jaw: None, normal Tongue: None, normal,Extremity Movements Upper (arms, wrists, hands, fingers): None, normal Lower (legs, knees, ankles, toes): None, normal, Trunk Movements Neck, shoulders, hips: None, normal, Overall Severity Severity of abnormal movements (highest score from questions above): None, normal Incapacitation due to abnormal movements: None, normal Patient's awareness of abnormal movements (rate only patient's report): No Awareness, Dental Status Current problems with teeth and/or dentures?: No Does patient usually  wear dentures?: No  CIWA:  CIWA-Ar Total: 1 COWS:     Musculoskeletal: Strength & Muscle Tone: within normal limits Gait & Station: normal Patient leans: N/A  Psychiatric Specialty Exam: Physical Exam  Constitutional: She is oriented to person, place, and time. She appears well-developed and well-nourished.  HENT:  Head: Normocephalic and atraumatic.  Respiratory: Effort normal.  Neurological: She is alert and oriented to person, place, and time.  Psychiatric:  As above     ROS  Blood pressure 119/71, pulse (!) 133, temperature 98.5 F (36.9 C), resp. rate 18, height 5' 9.25" (1.759 m), weight 124.7 kg (275 lb).Body mass index is 40.32 kg/m.  General Appearance: pleasant, neatly dressed. Calm and cooperative. Good relatedness. Appropriate behavior.  Eye Contact:  Good  Speech:  Clear and Coherent and Normal Rate  Volume:  Normal  Mood:  Euthymic  Affect:  Appropriate and Full Range  Thought Process:  Linear  Orientation:  Full (Time, Place, and Person)  Thought Content:  No delusional theme. No preoccupation with violent thoughts. No negative ruminations. No obsession.  No hallucination in any modality.   Suicidal Thoughts:  No  Homicidal Thoughts:  No  Memory:  Immediate;   Good Recent;   Good Remote;   Good  Judgement:  Good  Insight:  Good  Psychomotor Activity:  Normal  Concentration:  Concentration: Good and Attention Span: Good  Recall:  Good  Fund of Knowledge:  Good  Language:  Good  Akathisia:  Negative  Handed:    AIMS (if indicated):     Assets:  Communication Skills Desire for Improvement Housing Physical Health Resilience Transportation  ADL's:  Intact  Cognition:  WNL  Sleep:  Number of Hours: 6.75     Treatment Plan Summary: Patient has responded well to treatment. She is tolerating her medication well. No new stressors. No suicidal thoughts. No homicidal or violent thoughts. We plan to evaluate her overnight. Hopeful discharge tomorrow.  Psychiatric: MDD Recurrent  Medical: HTN IBS Gastroparesis  Psychosocial:  Taking care of her aging parents  PLAN: 1. Continue current regimen 2. Continue to monitor mood, behavior and interaction with peers.     Georgiann CockerVincent A Izediuno, MD 03/26/2017, 4:59 PM

## 2017-03-26 NOTE — Plan of Care (Signed)
Patient verbalizes understanding of information, education provided.   Patient has not engaged in self harm.  Patient is med compliant. Expresses needs for prn when appropriate.

## 2017-03-26 NOTE — Progress Notes (Signed)
Adult Psychoeducational Group Note  Date:  03/26/2017 Time:  9:33 PM  Group Topic/Focus:  Wrap-Up Group:   The focus of this group is to help patients review their daily goal of treatment and discuss progress on daily workbooks.  Participation Level:  Active  Participation Quality:  Appropriate  Affect:  Appropriate  Cognitive:  Appropriate  Insight: Appropriate  Engagement in Group:  Engaged  Modes of Intervention:  Discussion  Additional Comments:  Patient attended group and said that his day was a 10.  Patient was excited that she will be discharged tomorrow.   Brandyn Lowrey W Jacquelynne Guedes 03/26/2017, 9:33 PM

## 2017-03-26 NOTE — Progress Notes (Signed)
D: Patient observed up and visible in the dayroom. Interactions appropriate with staff and peers. Patient denies any issues this morning. States she would like to discharge. Affect masked, mood depressed, anxious and patient is cautious on approach. States she has spoken with family and she has identified a safe should she feel suicidal again. Patient does not appear to understand the seriousness of her OD prior to admit.  Per self inventory and discussions with writer, rates depression, hopelessness and anxiety all at a 0/10. Rates sleep as good, appetite as good, energy as normal and concentration as good.  States goal for today is "going home, staying positive." Denies pain, physical complaints.   A: Medicated per orders, no prns requested or required. Level III obs in place for safety. Emotional support offered and self inventory reviewed. Encouraged completion of Suicide Safety Plan and programming participation. Discussed POC with MD, SW.  Fall prevention plan in place and reviewed with patient as pt is a high fall risk due to falls in the last 6 months.   R: Patient verbalizes understanding of POC, falls prevention education. Patient denies SI/HI/AVH and remains safe on level III obs. Will continue to monitor closely and make verbal contact frequently.

## 2017-03-26 NOTE — Progress Notes (Signed)
Patient ID: [name], female   DOB: 11/30/1970, 46 y.o.   MRN: [id number]   Pt currently presents with an appropriate affect and pleasant behavior. Pt interacts positively with peers on the unit. Pt reports to writer that their goal is to "go home tomorrow." Pt states "I had a good day today." Pt reports good sleep with current medication regimen.   Pt provided with medications per providers orders. Pt's labs and vitals were monitored throughout the night. Pt given a 1:1 about emotional and mental status. Pt supported and encouraged to express concerns and questions. Pt educated on medications.  Pt's safety ensured with 15 minute and environmental checks. Pt currently denies SI/HI and A/V hallucinations. Pt verbally agrees to seek staff if SI/HI or A/VH occurs and to consult with staff before acting on any harmful thoughts. Will continue POC.

## 2017-03-27 MED ORDER — GABAPENTIN 100 MG PO CAPS: 100.0000 mg | ORAL_CAPSULE | Freq: Two times a day (BID) | ORAL | 0 refills | Status: DC

## 2017-03-27 MED ORDER — VENLAFAXINE HCL ER 75 MG PO CP24: 75.0000 mg | ORAL_CAPSULE | Freq: Every day | ORAL | 0 refills | Status: DC

## 2017-03-27 MED ORDER — TRAZODONE HCL 50 MG PO TABS: 50.0000 mg | ORAL_TABLET | Freq: Every evening | ORAL | 0 refills | Status: DC | PRN

## 2017-03-27 MED ORDER — MIRTAZAPINE 15 MG PO TABS: 15.0000 mg | ORAL_TABLET | Freq: Every day | ORAL | 0 refills | Status: DC

## 2017-03-27 MED ORDER — GABAPENTIN 400 MG PO CAPS: 400.0000 mg | ORAL_CAPSULE | Freq: Every day | ORAL | 0 refills | Status: DC

## 2017-03-27 NOTE — Progress Notes (Addendum)
Recreation Therapy Notes  Date: 03/27/17 Time: 0930 Location: 300 Hall Dayroom  Group Topic: Stress Management  Goal Area(s) Addresses:  Patient will verbalize importance of using healthy stress management.  Patient will identify positive emotions associated with healthy stress management.   Intervention: Stress Management  Activity :  Progressive Muscle Relaxation.  LRT read a script to lead patients through the stress management technique of progressive muscle relaxation.  Patients were to follow along to engage in the activity as LRT read the script.  Education:  Stress Management, Discharge Planning.   Education Outcome: Acknowledges edcuation/In group clarification offered/Needs additional education  Clinical Observations/Feedback: Pt did not attend group.    Caroll RancherMarjette Roshawn Ayala, LRT/CTRS          Lillia AbedLindsay, Jamecia Lerman A 03/27/2017 11:15 AM

## 2017-03-27 NOTE — Progress Notes (Signed)
  [facility] Dis[facility] :  Will you be returning to the same living situation after discharge:  Yes,  home At discharge, do you have transportation home?: Yes,  husband Do you have the ability to pay for your medications: Yes,  insurance  Release of information consent forms completed and in the chart;  Patient's signature needed at discharge.  Patient to Follow up at: Follow-up Information    [facility]. Go on 04/02/2017.   Why:  Please attend your follow up appointment on Thursday, 04/02/17, at 9:00am.  Please bring photo ID, social security card, insurance card, and a bank statement or pay  stub. [place] option [place] is [facility][phone] Contact information: [address] [place] [place] [address] [phone]           Next level of care provider has access to [facility] Link:no  Safety Planning and Suicide Prevention discussed: Yes,  yes  Have you used any form of tobacco in the last 30 days? (Cigarettes, Smokeless Tobacco, Cigars, and/or Pipes): Yes  Has patient been referred to the Quitline?: Patient refused referral  Patient has been referred for addiction treatment: N/A  Ida RogueRodney B Shanitra Phillippi, LCSW 03/27/2017, 10:05 AM

## 2017-03-27 NOTE — [facility] Suicide Risk Assessment (Signed)
[facility]   Principal Problem: MDD (major depressive disorder), recurrent episode, severe (HCC) Discharge Diagnoses:  Patient Active Problem List   Diagnosis Date Noted  . MDD (major depressive disorder), recurrent episode, severe (HCC) [F33.2] 03/24/2017  . Symptomatic mammary hypertrophy [N62] 01/09/2016  . Nicotine dependence [F17.200] 12/25/2015    Total Time spent with patient: 30 minutes  Musculoskeletal: Strength & Muscle Tone: within normal limits Gait & Station: normal Patient leans: N/A  Psychiatric Specialty Exam: ROS  Blood pressure 123/88, pulse 100, temperature 98.4 F (36.9 C), temperature source Oral, resp. rate 20, height 5' 9.25" (1.759 m), weight 124.7 kg (275 lb).Body mass index is 40.32 kg/m.  General Appearance: Neatly dressed, pleasant, engaging well and cooperative. Appropriate behavior. Not in any distress. Good relatedness. Not internally stimulated.  Eye Contact::  Good  Speech:  Spontaneous, normal prosody. Normal tone and rate.   Volume:  Normal  Mood:  Euthymic  Affect:  Appropriate and Full Range  Thought Process:  Linear  Orientation:  Full (Time, Place, and Person)  Thought Content:  Future oriented. No delusional theme. No preoccupation with violent thoughts. No negative ruminations. No obsession.  No hallucination in any modality.   Suicidal Thoughts:  No  Homicidal Thoughts:  No  Memory:  Immediate;   Good Recent;   Good Remote;   Good  Judgement:  Good  Insight:  Good  Psychomotor Activity:  Normal  Concentration:  Good  Recall:  Good  Fund of Knowledge:Good  Language: Good  Akathisia:  Negative  Handed:    AIMS (if indicated):     Assets:  Communication Skills Desire for Improvement Financial Resources/Insurance Housing Physical Health Resilience Transportation  Sleep:  Number of Hours: 6.25  Cognition: WNL  ADL's:  Intact   Clinical  Assessment::   46y.o Caucasian female, separated, lives  wit her mother and God son. Background history ofMDD and anxiety disorder. [facility] She presented with altered mental status. She overdosed on forty pills of Benadryl. She sent a friend text " I'm dying". Her mother heard her fall in the house. She had clonic tonic seizure activity at home. At the ER, patient regreted suicidal behavior. She denied any intent to kill herself. She reported being stressed by family demands. She takes care of her ailing parents and her God son.Routine labsare significant for stable elevated Hct, mildly elevated WBCC.Toxicology is negative, UDS is positive for benzodiazepines. No alcohol or substance use.   Seen today. Patient reports that she is in good spirits.depression Reports normal energy and interest. Has been maintaining normal biological functions. She is able to think clearly. She is able to focus on task. Her thoughts are not crowded or racing. No evidence of mania. No hallucination in any modality. She is not making any delusional statement. No passivity of will/thought. She is fully in touch with reality. No thoughts of suicide. No thoughts of homicide. No violent thoughts. No overwhelming anxiety. No access to weapons.  Nursing staff reports that patient has been appropriate on the unit. Patient has been interacting well with peers. No behavioral issues. Patient has not voiced any suicidal thoughts. Patient has not been observed to be internally stimulated. Patient has been adherent with treatment recommendations. Patient has been tolerating their medication well.   Patient was discussed at team. Team members feels that patient is back to her baseline level of function. Team agrees with plan to discharge patient today.     Demographic Factors:  NA  Loss Factors: anticipatory bereavment  Historical Factors: Impulsivity  Risk Reduction Factors:   Responsible for children under 47 years of age, Sense of  responsibility to family, Living with another person, especially a relative, Positive social support, Positive therapeutic relationship and Positive coping skills or problem solving skills  Continued Clinical Symptoms:  As above   Cognitive Features That Contribute To Risk:  None    Suicide Risk:  Minimal: No identifiable suicidal ideation.  Patient is not having any thoughts of suicide at this time. Modifiable risk factors targeted during this admission includes depression and adjustment disorder. Demographical and historical risk factors cannot be modified. Patient is now engaging well. Patient is reliable and is future oriented. We have buffered patient's support structures. At this point, patient is at low risk of suicide. Patient is aware of the effects of psychoactive substances on decision making process. Patient has been provided with emergency contacts. Patient acknowledges to use resources provided if unforseen circumstances changes their current risk stratification.    Follow-up Information    [facility]. Go on 04/02/2017.   Why:  Please attend your follow up appointment on Thursday, 04/02/17, at 9:00am.  Please bring photo ID, social security card, insurance card, and a bank statement or pay stub. Contact information: [address] [place] [address] [phone]           Plan Of Care/Follow-up recommendations:  1. Continue current psychotropic medications 2. Mental health and addiction follow up as arranged.  3. Discharge in care of her family 4. Provided limited quantity of prescriptions   Georgiann CockerVincent A Izediuno, MD 03/27/2017, 9:47 AM

## 2017-03-27 NOTE — Progress Notes (Signed)
Patient was discharged per order after a morning in which she denied SI, HI, AVH and appeared euthymic. AVS, SRA, medications, scripts and transition summary were all reviewed with patient. Pt was given an opportunity to ask questions and verbalized understanding of all discharge paperwork. Belongings were returned, and patient signed for receipt. Patient verbalized readiness for discharge and appeared in no acute distress when escorted to lobby.

## 2017-03-27 NOTE — Discharge Summary (Signed)
Physician Discharge Summary Note  Patient:  [name] is an 46 y.o., female MRN:  [id number] DOB:  09-20-1970 Patient phone:  [phone] (home)  Patient address[place]   [address] [place] [id number],  Total Time spent with patient: 20 minutes  Date of Admission:  03/24/2017 Date of Discharge: 03/27/17   Reason for Admission:  Severe anxiety with MDD and SI  Principal Problem: MDD (major depressive disorder), recurrent episode, severe [facility](HCC) Discharge Diagnoses: Patient Active Problem List   Diagnosis Date Noted  . MDD (major depressive disorder), recurrent episode, severe (HCC) [F33.2] 03/24/2017  . Symptomatic mammary hypertrophy [N62] 01/09/2016  . Nicotine dependence [F17.200] 12/25/2015    Past Psychiatric History: MDD and Anxiety disorder. She did well on combination of Prozac, Bupropion and Xanax in the past. Her PCP has always prescribed and managed her medications. Says Venlafaxine was recently added because she was having vasomotor symptoms. No past history of mania. No past history of psychosis. No past history of suicidal behavior. No past history of violent behavior. She has never had any inpatient treatment in the past. No past psychological or physical treatment  Past Medical History:  Past Medical History:  Diagnosis Date  . Anxiety   . Arthritis   . Asthma   . Depression   . Endometriosis t  . Gastric bypass status for obesity t  . GERD (gastroesophageal reflux disease) t  . Hernia, diaphragmatic t  . Hx of hysterectomy t  . Hypertension   . PONV (postoperative nausea and vomiting)   . S/P cholecystectomy t    Past Surgical History:  Procedure Laterality Date  . ABDOMINAL HYSTERECTOMY    . ABDOMINAL SURGERY    . BREAST REDUCTION SURGERY Bilateral 01/09/2016   Procedure: MAMMARY REDUCTION  (BREAST) BILATERAL;  Surgeon: Peggye Formlaire S Dillingham, DO;  Location: [facility];  Service: Plastics;  Laterality: Bilateral;  . CHOLECYSTECTOMY    .  HERNIA REPAIR     hiatal hernia x3, abd hernia  . HIATAL HERNIA REPAIR     was done with lapband on two occasions.  [employer] mesh was used and removed  . lap band removal    . LAPAROSCOPIC GASTRIC BANDING  2005   was done with hiatus hernia repair.  redone in 2006   . UMBILICAL HERNIA REPAIR  01/10/11   emergency hernia repair due to hernia twisted in intestines    Family History:  Family History  Problem Relation Age of Onset  . Cancer Maternal Aunt        melanoma  . Diabetes Father   . Heart attack Father    Family Psychiatric  History: Has an uncle with Bipolar Disorder. No family history of suicide. No family history of addiction Social History:  Social History   Substance and Sexual Activity  Alcohol Use No     Social History   Substance and Sexual Activity  Drug Use No    Social History   Socioeconomic History  . Marital status: Legally Separated    Spouse name: None  . Number of children: None  . Years of education: None  . Highest education level: None  Social Needs  . Financial resource strain: None  . Food insecurity - worry: None  . Food insecurity - inability: None  . Transportation needs - medical: None  . Transportation needs - non-medical: None  Occupational History  . None  Tobacco Use  . Smoking status: Former Smoker    Packs/day: 0.25  Types: Cigarettes    Last attempt to quit: 05/20/2015    Years since quitting: 1.8  . Smokeless tobacco: Never Used  Substance and Sexual Activity  . Alcohol use: No  . Drug use: No  . Sexual activity: Yes    Birth control/protection: Surgical  Other Topics Concern  . None  Social History Narrative  . None    Hospital Course:   03/24/17 [facility] MD Assessment: 46 y.o Caucasian female, separated, lives wit her mother and God son. Background history of MDD and anxiety disorder. Presented to the ER via emergency services. She presented with altered mental status. She overdosed on forty pills of Benadryl. She  sent a friend text " I ma dying". Her mother heard her fall in the house. She had clonic tonic seizure activity at home. At the ER, patient regreted suicidal behavior. She denied any intent to kill herself. She reported being stressed by family demands. She takes care of her ailing parents and her God son.  Routine labs are significant for stable elevated Hct, mildly elevated WBCC. Toxicology is negative,  UDS is positive for benzodiazepines. No alcohol or substance use.  At interview, patient tells me that she had been suffering from insomnia. Says she had not slept for over seven days. Says she took the benadryl hoping she would get some sleep. Says gradually she took more and more. Denies any intent to kill self. Says she started feeling funny hence she sent a text to her friend. Patient says she is glad to be alive. Says she had been overwhelmed with day to day activity. Says her father recently had stroke. He has become forgetful since then. Says her step mother was recently diagnosed with lung cancer. Patient says she has been driving everyone around for their various appointments. Says she had not been eating much, she has been feeling more tired. Patient reports worsening depression since she came off her antidepressant three years ago. Says she was recently restarted on another combination of medication. No associated hallucinations. No delusional preoccupation. No associated manic symptoms. Patient has suffered from anxiety all her life. Tells me her family has a history of cardiac disease and death from cardiac disease. Says she has always had tachycardia and this runs in the family. Patient says she wants to get better and be back with her family. She denies any current death wish. No new stressors. No thoughts of violence. No homicidal thoughts. No history of substance use. No access to weapons.  Patient remained on the [facility] unit for 3 days and stabilized with medications and therapy. Patient was  started on Gabapentin and titrated to 100 mg BID and 400 mg QHS, Remeron 15 mg QHS, Effexor-XR 75 mg Daily, and Trazodone 50 mg PO QHS. She was placed on the Librium PRN detox protocol. Patient continued her HTN medication as well. Patient showed improvement with improved mood, affect, sleep, appetite, and interaction. Patient was seen attending group sand participating. Patient denies any SI/HI/AVH and contracts for safety. Patient agrees to follow up at [facility]. Patient is provided with prescriptions for her medications upon discharge.    Physical Findings: AIMS: Facial and Oral Movements Muscles of Facial Expression: None, normal Lips and Perioral Area: None, normal Jaw: None, normal Tongue: None, normal,Extremity Movements Upper (arms, wrists, hands, fingers): None, normal Lower (legs, knees, ankles, toes): None, normal, Trunk Movements Neck, shoulders, hips: None, normal, Overall Severity Severity of abnormal movements (highest score from questions above): None, normal Incapacitation due to abnormal  movements: None, normal Patient's awareness of abnormal movements (rate only patient's report): No Awareness, Dental Status Current problems with teeth and/or dentures?: No Does patient usually wear dentures?: No  CIWA:  CIWA-Ar Total: 1 COWS:     Musculoskeletal: Strength & Muscle Tone: within normal limits Gait & Station: normal Patient leans: N/A  Psychiatric Specialty Exam: Physical Exam  Nursing note and vitals reviewed. Constitutional: She is oriented to person, place, and time. She appears well-developed and well-nourished.  Respiratory: Effort normal.  Musculoskeletal: Normal range of motion.  Neurological: She is alert and oriented to person, place, and time.  Skin: Skin is warm.    Review of Systems  Constitutional: Negative.   HENT: Negative.   Eyes: Negative.   Respiratory: Negative.   Cardiovascular: Negative.   Gastrointestinal: Negative.    Genitourinary: Negative.   Musculoskeletal: Negative.   Skin: Negative.   Neurological: Negative.   Endo/Heme/Allergies: Negative.   Psychiatric/Behavioral: Negative.     Blood pressure 117/71, pulse (!) 115, temperature 98.5 F (36.9 C), resp. rate 18, height 5' 9.25" (1.759 m), weight 124.7 kg (275 lb).Body mass index is 40.32 kg/m.  General Appearance: Casual  Eye Contact:  Good  Speech:  Clear and Coherent and Normal Rate  Volume:  Normal  Mood:  Euthymic  Affect:  Appropriate  Thought Process:  Goal Directed and Descriptions of Associations: Intact  Orientation:  Full (Time, Place, and Person)  Thought Content:  WDL  Suicidal Thoughts:  No  Homicidal Thoughts:  No  Memory:  Immediate;   Good Recent;   Good Remote;   Good  Judgement:  Good  Insight:  Good  Psychomotor Activity:  Normal  Concentration:  Concentration: Good and Attention Span: Good  Recall:  Good  Fund of Knowledge:  Good  Language:  Good  Akathisia:  No  Handed:  Right  AIMS (if indicated):     Assets:  Communication Skills Desire for Improvement Financial Resources/Insurance Housing Physical Health Social Support Transportation  ADL's:  Intact  Cognition:  WNL  Sleep:  Number of Hours: 6.25     Have you used any form of tobacco in the last 30 days? (Cigarettes, Smokeless Tobacco, Cigars, and/or Pipes): Yes  Has this patient used any form of tobacco in the last 30 days? (Cigarettes, Smokeless Tobacco, Cigars, and/or Pipes) Yes, Yes, A prescription for an FDA-approved tobacco cessation medication was offered at discharge and the patient refused  Blood Alcohol level:  Lab Results  Component Value Date   ETH <10 03/23/2017    Metabolic Disorder Labs:  No results found for: HGBA1C, MPG No results found for: PROLACTIN No results found for: CHOL, TRIG, HDL, CHOLHDL, VLDL, LDLCALC  See Psychiatric Specialty Exam and Suicide Risk Assessment completed by Attending Physician prior to  discharge.  Discharge destination:  Home  Is patient on multiple antipsychotic therapies at discharge:  No   Has Patient had three or more failed trials of antipsychotic monotherapy by history:  No  Recommended Plan for Multiple Antipsychotic Therapies: NA   Allergies as of 03/27/2017      Reactions   Amoxicillin    Influenza Vaccines    States her "arm had a knot and turned red"      Medication List    STOP taking these medications   acetaminophen 500 MG tablet Commonly known as:  TYLENOL   ALPRAZolam 1 MG tablet Commonly known as:  XANAX   buPROPion 300 MG 24 hr tablet Commonly known as:  WalgreenWELLBUTRIN  XL   diclofenac 75 MG EC tablet Commonly known as:  VOLTAREN   FLUoxetine 40 MG capsule Commonly known as:  PROZAC   lisinopril-hydrochlorothiazide 10-12.5 MG tablet Commonly known as:  PRINZIDE,ZESTORETIC   montelukast 10 MG tablet Commonly known as:  SINGULAIR   senna 8.6 MG Tabs tablet Commonly known as:  SENOKOT   venlafaxine 75 MG tablet Commonly known as:  EFFEXOR Replaced by:  venlafaxine XR 75 MG 24 hr capsule     TAKE these medications     Indication  albuterol 108 (90 Base) MCG/ACT inhaler Commonly known as:  PROVENTIL HFA;VENTOLIN HFA Inhale 2 puffs into the lungs every 6 (six) hours as needed. For shortness of breath  Indication:  Asthma   azelastine 0.1 % nasal spray Commonly known as:  ASTELIN Place 2 sprays into both nostrils 2 (two) times daily as needed for rhinitis. Use in each nostril as directed  Indication:  Hayfever   gabapentin 100 MG capsule Commonly known as:  NEURONTIN Take 1 capsule (100 mg total) by mouth 2 (two) times daily. For agitation  Indication:  Agitation   gabapentin 400 MG capsule Commonly known as:  NEURONTIN Take 1 capsule (400 mg total) by mouth at bedtime. For sleep and agitation  Indication:  Agitation, Trouble Sleeping   mirtazapine 15 MG tablet Commonly known as:  REMERON Take 1 tablet (15 mg total) by  mouth at bedtime. For mood control  Indication:  mood stability   PROTONIX 40 MG tablet Generic drug:  pantoprazole Take 40 mg by mouth daily.  Indication:  Gastroesophageal Reflux Disease   traZODone 50 MG tablet Commonly known as:  DESYREL Take 1 tablet (50 mg total) by mouth at bedtime as needed for sleep. For sleep What changed:    when to take this  reasons to take this  additional instructions  Indication:  Trouble Sleeping   venlafaxine XR 75 MG 24 hr capsule Commonly known as:  EFFEXOR-XR Take 1 capsule (75 mg total) by mouth daily. For mood control Start taking on:  03/28/2017 Replaces:  venlafaxine 75 MG tablet  Indication:  mood stability      Follow-up Information    Services, [facility]. Go on 04/02/2017.   Why:  Please attend your follow up appointment on Thursday, 04/02/17, at 9:00am.  Please bring photo ID, social security card, insurance card, and a bank statement or pay stub. Contact information: [address] [place] [address] [phone]           Follow-up recommendations:  Continue activity as tolerated. Continue diet as recommended by your PCP. Ensure to keep all appointments with outpatient providers.  Comments:  Patient is instructed prior to discharge to: Take all medications as prescribed by his/her mental healthcare provider. Report any adverse effects and or reactions from the medicines to his/her outpatient provider promptly. Patient has been instructed & cautioned: To not engage in alcohol and or illegal drug use while on prescription medicines. In the event of worsening symptoms, patient is instructed to call the crisis hotline, 911 and or go to the nearest ED for appropriate evaluation and treatment of symptoms. To follow-up with his/her primary care provider for your other medical issues, concerns and or health care needs.    Signed: Gerlene Burdock Shanesha Bednarz, FNP 03/27/2017, 8:33 AM

## 2017-04-02 DIAGNOSIS — F329 Major depressive disorder, single episode, unspecified: Secondary | ICD-10-CM | POA: Insufficient documentation

## 2017-10-21 ENCOUNTER — Ambulatory Visit (INDEPENDENT_AMBULATORY_CARE_PROVIDER_SITE_OTHER): Payer: Medicare Other | Admitting: Psychology

## 2017-10-21 DIAGNOSIS — F411 Generalized anxiety disorder: Secondary | ICD-10-CM | POA: Diagnosis not present

## 2017-10-21 DIAGNOSIS — F4310 Post-traumatic stress disorder, unspecified: Secondary | ICD-10-CM

## 2017-11-04 ENCOUNTER — Ambulatory Visit: Payer: Medicare Other | Admitting: Psychology

## 2018-01-11 ENCOUNTER — Other Ambulatory Visit: Payer: Self-pay | Admitting: Radiology

## 2018-01-21 ENCOUNTER — Encounter (HOSPITAL_COMMUNITY): Payer: Self-pay | Admitting: Emergency Medicine

## 2018-01-21 ENCOUNTER — Emergency Department (HOSPITAL_COMMUNITY)
Admission: EM | Admit: 2018-01-21 | Discharge: 2018-01-21 | Disposition: A | Discharge status: Home / Self Care | Payer: Medicare Other | Attending: Emergency Medicine | Admitting: Emergency Medicine

## 2018-01-21 ENCOUNTER — Emergency Department (HOSPITAL_COMMUNITY): Payer: Medicare Other

## 2018-01-21 DIAGNOSIS — J209 Acute bronchitis, unspecified: Secondary | ICD-10-CM

## 2018-01-21 DIAGNOSIS — Z79899 Other long term (current) drug therapy: Secondary | ICD-10-CM | POA: Diagnosis not present

## 2018-01-21 DIAGNOSIS — R05 Cough: Secondary | ICD-10-CM | POA: Diagnosis present

## 2018-01-21 DIAGNOSIS — Z87891 Personal history of nicotine dependence: Secondary | ICD-10-CM | POA: Diagnosis not present

## 2018-01-21 DIAGNOSIS — J9801 Acute bronchospasm: Secondary | ICD-10-CM | POA: Insufficient documentation

## 2018-01-21 MED ORDER — ALBUTEROL SULFATE HFA 108 (90 BASE) MCG/ACT IN AERS
1.0000 | INHALATION_SPRAY | Freq: Four times a day (QID) | RESPIRATORY_TRACT | Status: DC | PRN
  Administered 2018-01-21: 2 via RESPIRATORY_TRACT
  Filled 2018-01-21: qty 6.7

## 2018-01-21 MED ORDER — PREDNISONE 20 MG PO TABS
60.0000 mg | ORAL_TABLET | Freq: Once | ORAL | Status: AC
  Administered 2018-01-21: 60 mg via ORAL
  Filled 2018-01-21: qty 3

## 2018-01-21 MED ORDER — PREDNISONE 50 MG PO TABS: 50.0000 mg | ORAL_TABLET | Freq: Every day | ORAL | 0 refills | Status: DC

## 2018-01-21 MED ORDER — ALBUTEROL SULFATE (2.5 MG/3ML) 0.083% IN NEBU
5.0000 mg | INHALATION_SOLUTION | Freq: Once | RESPIRATORY_TRACT | Status: AC
  Administered 2018-01-21: 5 mg via RESPIRATORY_TRACT
  Filled 2018-01-21: qty 6

## 2018-01-21 MED ORDER — IPRATROPIUM BROMIDE 0.02 % IN SOLN
0.5000 mg | Freq: Once | RESPIRATORY_TRACT | Status: AC
  Administered 2018-01-21: 0.5 mg via RESPIRATORY_TRACT
  Filled 2018-01-21: qty 2.5

## 2018-01-21 NOTE — Discharge Instructions (Addendum)
Take the inhaler as prescribed.  Pick up your steroid prescription tomorrow and take it for the next 5 days.  Follow-up with your doctor if you are not improving.  Return to the emergency need for increasing shortness of breath, worsening symptoms.  Try to quit smoking since these episodes can become more severe as time goes on

## 2018-01-21 NOTE — ED Triage Notes (Signed)
Pt c/o cough, congestion, sinus pressure, and wheezing that started Monday.

## 2018-01-21 NOTE — ED Provider Notes (Signed)
[facility] EMERGENCY DEPARTMENT Provider Note   CSN: [id number] Arrival date & time: 01/21/18  1705     History   Chief Complaint Chief Complaint  Patient presents with  . URI    HPI [name] is a 47 y.o. female.  HPI Patient presents to the emergency room for evaluation of cough, congestion that started on Monday.  Patient felt like her symptoms started with sinus inflammation and a URI.  Her symptoms have progressed however and she started to become short of breath and felt like she was wheezing.  Patient does smoke but denies any history of asthma.  She has had trouble with bronchitis in the past.  She denies any trouble with fevers.  No chest pain.  No leg swelling. Past Medical History:  Diagnosis Date  . Anxiety   . Arthritis   . Asthma   . Depression   . Endometriosis t  . Gastric bypass status for obesity t  . GERD (gastroesophageal reflux disease) t  . Hernia, diaphragmatic t  . Hx of hysterectomy t  . Hypertension   . PONV (postoperative nausea and vomiting)   . S/P cholecystectomy t    Patient Active Problem List   Diagnosis Date Noted  . MDD (major depressive disorder), recurrent episode, severe (HCC) 03/24/2017  . Symptomatic mammary hypertrophy 01/09/2016  . Nicotine dependence 12/25/2015    Past Surgical History:  Procedure Laterality Date  . ABDOMINAL HYSTERECTOMY    . ABDOMINAL SURGERY    . BREAST REDUCTION SURGERY Bilateral 01/09/2016   Procedure: MAMMARY REDUCTION  (BREAST) BILATERAL;  Surgeon: Peggye Form, DO;  Location: [facility];  Service: Plastics;  Laterality: Bilateral;  . CHOLECYSTECTOMY    . HERNIA REPAIR     hiatal hernia x3, abd hernia  . HIATAL HERNIA REPAIR     was done with lapband on two occasions.  [employer] mesh was used and removed  . lap band removal    . LAPAROSCOPIC GASTRIC BANDING  2005   was done with hiatus hernia repair.  redone in 2006   . UMBILICAL HERNIA REPAIR   01/10/11   emergency hernia repair due to hernia twisted in intestines      OB History   None      Home Medications    Prior to Admission medications   Medication Sig Start Date End Date Taking? Authorizing Provider  albuterol (PROVENTIL HFA;VENTOLIN HFA) 108 (90 BASE) MCG/ACT inhaler Inhale 2 puffs into the lungs every 6 (six) hours as needed. For shortness of breath    [provider]  azelastine (ASTELIN) 137 MCG/SPRAY nasal spray Place 2 sprays into both nostrils 2 (two) times daily as needed for rhinitis. Use in each nostril as directed    [provider]  gabapentin (NEURONTIN) 100 MG capsule Take 1 capsule (100 mg total) by mouth 2 (two) times daily. For agitation 03/27/17   Money, Gerlene Burdock, FNP  gabapentin (NEURONTIN) 400 MG capsule Take 1 capsule (400 mg total) by mouth at bedtime. For sleep and agitation 03/27/17   Money, Gerlene Burdock, FNP  mirtazapine (REMERON) 15 MG tablet Take 1 tablet (15 mg total) by mouth at bedtime. For mood control 03/27/17   Money, Gerlene Burdock, FNP  pantoprazole (PROTONIX) 40 MG tablet Take 40 mg by mouth daily.    [provider]  predniSONE (DELTASONE) 50 MG tablet Take 1 tablet (50 mg total) by mouth daily. 01/21/18   Linwood Dibbles, MD  traZODone (DESYREL) 50 MG tablet Take 1 tablet (50 mg total) by mouth at bedtime as needed for sleep. For sleep 03/27/17   Money, Gerlene Burdock, FNP  venlafaxine XR (EFFEXOR-XR) 75 MG 24 hr capsule Take 1 capsule (75 mg total) by mouth daily. For mood control 03/28/17   Money, Gerlene Burdock, FNP    Family History Family History  Problem Relation Age of Onset  . Cancer Maternal Aunt        melanoma  . Diabetes Father   . Heart attack Father     Social History Social History   Tobacco Use  . Smoking status: Former Smoker    Packs/day: 1.00    Types: Cigarettes    Last attempt to quit: 05/20/2015    Years since quitting: 2.6  . Smokeless tobacco: Never Used  Substance Use Topics  . Alcohol use: No    . Drug use: No     Allergies   Amoxicillin and Influenza vaccines   Review of Systems Review of Systems  All other systems reviewed and are negative.    Physical Exam Updated Vital Signs BP 135/77   Pulse (!) 102   Temp 98 F (36.7 C)   Resp 18   Ht 1.803 m (5\' 11" )   Wt 133.8 kg   SpO2 98%   BMI 41.14 kg/m   Physical Exam  Constitutional: She appears well-developed and well-nourished. No distress.  HENT:  Head: Normocephalic and atraumatic.  Right Ear: External ear normal.  Left Ear: External ear normal.  Mouth/Throat: No oropharyngeal exudate.  Eyes: Conjunctivae are normal. Right eye exhibits no discharge. Left eye exhibits no discharge. No scleral icterus.  Neck: Neck supple. No tracheal deviation present.  Cardiovascular: Regular rhythm and intact distal pulses. Tachycardia present.  Pulmonary/Chest: Effort normal. No stridor. No respiratory distress. She has wheezes. She has no rales.  Abdominal: Soft. Bowel sounds are normal. She exhibits no distension. There is no tenderness. There is no rebound and no guarding.  Musculoskeletal: She exhibits no edema or tenderness.  Neurological: She is alert. She has normal strength. No cranial nerve deficit (no facial droop, extraocular movements intact, no slurred speech) or sensory deficit. She exhibits normal muscle tone. She displays no seizure activity. Coordination normal.  Skin: Skin is warm and dry. No rash noted.  Psychiatric: She has a normal mood and affect.  Nursing note and vitals reviewed.    ED Treatments / Results  Labs (all labs ordered are listed, but only abnormal results are displayed) Labs Reviewed - No data to display  EKG EKG Interpretation  Date/Time:  Thursday January 21 2018 17:38:12 EDT Ventricular Rate:  111 PR Interval:  140 QRS Duration: 82 QT Interval:  334 QTC Calculation: 454 R Axis:   73 Text Interpretation:  Sinus tachycardia Otherwise normal ECG No significant change since  last tracing Confirmed by Linwood Dibbles [phone]) on 01/21/2018 7:56:37 PM   Radiology Dg Chest 2 View  Result Date: 01/21/2018 CLINICAL DATA:  Cough wheezing and short of breath EXAM: CHEST - 2 VIEW COMPARISON:  05/24/2017 FINDINGS: Mild lingular atelectasis. No acute consolidation or effusion. Normal heart size. No pneumothorax. Moderate hiatal hernia IMPRESSION: 1. Lingular atelectasis or scarring 2. Moderate hiatal hernia Electronically Signed   By: Jasmine Pang M.D.   On: 01/21/2018 18:39    Procedures Procedures (including critical care time)  Medications Ordered in ED Medications  albuterol (PROVENTIL HFA;VENTOLIN HFA) 108 (90 Base) MCG/ACT inhaler 1-2 puff (has no administration in time  range)  albuterol (PROVENTIL) (2.5 MG/3ML) 0.083% nebulizer solution 5 mg (5 mg Nebulization Given 01/21/18 1740)  ipratropium (ATROVENT) nebulizer solution 0.5 mg (0.5 mg Nebulization Given 01/21/18 2055)  albuterol (PROVENTIL) (2.5 MG/3ML) 0.083% nebulizer solution 5 mg (5 mg Nebulization Given 01/21/18 2055)  predniSONE (DELTASONE) tablet 60 mg (60 mg Oral Given 01/21/18 2055)     Initial Impression / Assessment and Plan / ED Course  I have reviewed the triage vital signs and the nursing notes.  Pertinent labs & imaging results that were available during my care of the patient were reviewed by me and considered in my medical decision making (see chart for details).  Clinical Course as of Jan 22 2155  Thu Jan 21, 2018  2156 Patient is feeling better after breathing treatments.  On repeat exam no wheezing noted   [JK]    Clinical Course User Index [JK] Linwood Dibbles, MD    She presented to the emergency room for shortness of breath.  Symptoms are consistent with bronchitis with bronchospasm.  This is likely related to her current smoking.  I counseled the patient on smoking cessation.  She improved with treatment.  Plan on discharge home with an albuterol inhaler and a prescription for  prednisone.  Final Clinical Impressions(s) / ED Diagnoses   Final diagnoses:  Bronchitis with bronchospasm    ED Discharge Orders         Ordered    predniSONE (DELTASONE) 50 MG tablet  Daily     01/21/18 2155           Linwood Dibbles, MD 01/21/18 2156

## 2018-01-21 NOTE — ED Notes (Signed)
Patient verbalizes understanding of discharge instructions. Opportunity for questioning and answers were provided. Ambulatory at discharge in NAD.  

## 2018-02-23 ENCOUNTER — Ambulatory Visit: Payer: Self-pay | Admitting: Plastic Surgery

## 2018-03-09 ENCOUNTER — Ambulatory Visit (INDEPENDENT_AMBULATORY_CARE_PROVIDER_SITE_OTHER): Payer: Medicare Other | Admitting: Plastic Surgery

## 2018-03-09 ENCOUNTER — Encounter: Payer: Self-pay | Admitting: Plastic Surgery

## 2018-03-09 DIAGNOSIS — M793 Panniculitis, unspecified: Secondary | ICD-10-CM | POA: Diagnosis not present

## 2018-03-10 ENCOUNTER — Encounter: Payer: Self-pay | Admitting: Plastic Surgery

## 2018-03-10 DIAGNOSIS — M793 Panniculitis, unspecified: Secondary | ICD-10-CM | POA: Insufficient documentation

## 2018-03-10 NOTE — Progress Notes (Signed)
Patient ID: [name], female    DOB: [birth year], 47 y.o.   MRN: [id number]   Chief Complaint  Patient presents with  . Skin Problem    The patient is a 47 year old white female here with her mom for evaluation of her pannus.  She underwent a breast reduction last year and is pleased surgery. She has a hanging pannus and complains of lower back pain. She finds it difficult to exercise, walk and run due to the excess weight of the pannus.  She complains of yeast infections intermittently in the skin folds and requires treatment for fungal infections.  She has been doing well with not increasing her weight and trying to eat healthy.  At the first of the year her insurance is changing and wants to wait to submit.  She has been working with [name] as well.  She underwent a hernia repair in the past.  She has several medical conditions listed below. Of particular importance in the Humira treatment which can be a challenge with healing after surgery.   Review of Systems  Constitutional: Negative.   HENT: Negative.   Eyes: Negative.   Respiratory: Negative.   Endocrine: Negative.   Genitourinary: Negative.   Musculoskeletal: Positive for back pain and gait problem.  Skin: Positive for color change and rash. Negative for pallor and wound.  Psychiatric/Behavioral: Negative.     Past Medical History:  Diagnosis Date  . Anxiety   . Arthritis   . Asthma   . Depression   . Endometriosis t  . Gastric bypass status for obesity t  . GERD (gastroesophageal reflux disease) t  . Hernia, diaphragmatic t  . Hx of hysterectomy t  . Hypertension   . PONV (postoperative nausea and vomiting)   . S/P cholecystectomy t    Past Surgical History:  Procedure Laterality Date  . ABDOMINAL HYSTERECTOMY    . ABDOMINAL SURGERY    . BREAST REDUCTION SURGERY Bilateral 01/09/2016   Procedure: MAMMARY REDUCTION  (BREAST) BILATERAL;  Surgeon: Peggye Form, DO;  Location: [facility];  Service: Plastics;  Laterality: Bilateral;  . CHOLECYSTECTOMY    . HERNIA REPAIR     hiatal hernia x3, abd hernia  . HIATAL HERNIA REPAIR     was done with lapband on two occasions.  [employer] mesh was used and removed  . lap band removal    . LAPAROSCOPIC GASTRIC BANDING  2005   was done with hiatus hernia repair.  redone in 2006   . UMBILICAL HERNIA REPAIR  01/10/11   emergency hernia repair due to hernia twisted in intestines       Current Outpatient Medications:  .  Adalimumab (HUMIRA PEN) 40 MG/0.8ML PNKT, Inject into the skin., Disp: , Rfl:  .  Adalimumab 40 MG/0.4ML PSKT, Inject into the skin., Disp: , Rfl:  .  buPROPion (WELLBUTRIN) 100 MG tablet, Take 100 mg by mouth 1 day or 1 dose., Disp: , Rfl:  .  montelukast (SINGULAIR) 10 MG tablet, one tablet at bedtime, Disp: , Rfl:  .  albuterol (PROVENTIL HFA;VENTOLIN HFA) 108 (90 BASE) MCG/ACT inhaler, Inhale 2 puffs into the lungs every 6 (six) hours as needed. For shortness of breath, Disp: , Rfl:  .  ALPRAZolam (XANAX) 1 MG tablet, Take by mouth., Disp: , Rfl:  .  azelastine (ASTELIN) 137 MCG/SPRAY nasal spray, Place 2 sprays into both nostrils 2 (two) times daily as needed for rhinitis. Use in each  nostril as directed, Disp: , Rfl:  .  gabapentin (NEURONTIN) 100 MG capsule, Take 1 capsule (100 mg total) by mouth 2 (two) times daily. For agitation, Disp: 60 capsule, Rfl: 0 .  gabapentin (NEURONTIN) 400 MG capsule, Take 1 capsule (400 mg total) by mouth at bedtime. For sleep and agitation, Disp: 30 capsule, Rfl: 0 .  HUMIRA PEN 40 MG/0.4ML PNKT, , Disp: , Rfl:  .  mirtazapine (REMERON) 15 MG tablet, Take 1 tablet (15 mg total) by mouth at bedtime. For mood control, Disp: 30 tablet, Rfl: 0 .  pantoprazole (PROTONIX) 40 MG tablet, Take 40 mg by mouth daily., Disp: , Rfl:  .  predniSONE (DELTASONE) 50 MG tablet, Take 1 tablet (50 mg total) by mouth daily., Disp: 5 tablet, Rfl: 0 .  traZODone (DESYREL) 50 MG tablet, Take 1 tablet  (50 mg total) by mouth at bedtime as needed for sleep. For sleep, Disp: 30 tablet, Rfl: 0 .  venlafaxine XR (EFFEXOR-XR) 75 MG 24 hr capsule, Take 1 capsule (75 mg total) by mouth daily. For mood control (Patient taking differently: Take 150 mg by mouth daily. For mood control), Disp: 30 capsule, Rfl: 0   Objective:   Vitals:   03/09/18 1333  BP: 130/82  Pulse: 86  Resp: 16  SpO2: 97%    Physical Exam  Constitutional: She is oriented to person, place, and time. She appears well-developed and well-nourished.  HENT:  Head: Normocephalic and atraumatic.  Eyes: Pupils are equal, round, and reactive to light. EOM are normal.  Cardiovascular: Normal rate.  Pulmonary/Chest: Effort normal. No respiratory distress.  Abdominal: Soft. She exhibits no distension. There is no tenderness. A hernia is present.  Musculoskeletal: She exhibits no edema.  Neurological: She is alert and oriented to person, place, and time.  Skin: Skin is warm.  Psychiatric: She has a normal mood and affect. Her behavior is normal. Judgment and thought content normal.    Assessment & Plan:  Panniculitis   I recommend that she come off the Humira prior to any surgery for at least a month prior and 6 weeks after surgery.  We can coordinate with Dr. Daphine DeutscherMartin.  Call placed to him to discuss.  Will see patient back in January for further discussion.  She would benefit from the panniculectomy but needs to understand she is likely to have skin breakdown and slow healing.  She of course must be tobacco free.  I recommend high protein diet and vitamins in preparation of getting healthy for any surgery.  Alena Billslaire S Dillingham, DO

## 2018-04-05 DIAGNOSIS — F4310 Post-traumatic stress disorder, unspecified: Secondary | ICD-10-CM | POA: Insufficient documentation

## 2018-05-11 ENCOUNTER — Ambulatory Visit (INDEPENDENT_AMBULATORY_CARE_PROVIDER_SITE_OTHER): Payer: Medicare HMO | Admitting: Plastic Surgery

## 2018-05-11 ENCOUNTER — Encounter: Payer: Self-pay | Admitting: Plastic Surgery

## 2018-05-11 VITALS — BP 122/84 | HR 96 | Temp 98.4°F | Ht 71.0 in | Wt 317.0 lb

## 2018-05-11 DIAGNOSIS — N641 Fat necrosis of breast: Secondary | ICD-10-CM | POA: Insufficient documentation

## 2018-05-11 DIAGNOSIS — M793 Panniculitis, unspecified: Secondary | ICD-10-CM | POA: Diagnosis not present

## 2018-05-11 NOTE — Progress Notes (Addendum)
   Subjective:    Patient ID: [name], female    DOB: 1970/09/30, 48 y.o.   MRN: [id number]  The patient is a 48 year old white female here for follow-up and further evaluation of her pannus and breast.  She underwent a breast reduction over a year ago she still very pleased.  She does have an area of fat necrosis on the right breast lower medial aspect.  This is tender to touch.  She underwent a biopsy of the area and it was negative.  She is also interested in a panniculectomy.  She has 2 hernias that she is planning to have fixed at the same time by [name].  He has fixed 1 in the past.  She complains of intermittent yeast infections in the folds of the pannus. She has been on Humira and her last dose was 2 weeks ago.  She has started taking her vitamins.  No new issues noted today.      Review of Systems  Constitutional: Positive for activity change. Negative for appetite change.  HENT: Negative.   Eyes: Negative.   Respiratory: Negative.   Cardiovascular: Negative.   Gastrointestinal: Negative.  Negative for abdominal distention and abdominal pain.  Endocrine: Negative.   Genitourinary: Negative.   Musculoskeletal: Positive for back pain.  Skin: Negative for color change and wound.  Psychiatric/Behavioral: Negative.        Objective:   Physical Exam Vitals signs and nursing note reviewed.  Constitutional:      Appearance: Normal appearance.  HENT:     Head: Normocephalic and atraumatic.     Nose: Nose normal.     Mouth/Throat:     Mouth: Mucous membranes are moist.  Eyes:     Extraocular Movements: Extraocular movements intact.     Pupils: Pupils are equal, round, and reactive to light.  Cardiovascular:     Rate and Rhythm: Normal rate.  Pulmonary:     Effort: Pulmonary effort is normal.  Abdominal:     General: Abdomen is flat. There is no distension.     Tenderness: There is no abdominal tenderness.  Neurological:     General: No focal deficit present.       Mental Status: She is alert.  Psychiatric:        Mood and Affect: Mood normal.        Thought Content: Thought content normal.        Judgment: Judgment normal.        Assessment & Plan:  Panniculitis  Breast, fat necrosis Recommend panniculectomy with [name] at the same time as he prepares her hernias.  We can also do the right breast fat necrosis excision at the same time.  This will need to be done after she has been off the Humira for a minimum of 6 weeks.  She was also instructed to not restart the Humira until a minimum of 6 weeks after the surgery. I reiterated today that she has a very high likelihood of a wound complication.  I believe that we can resolve it over time.  But it will be something we have to deal with postoperatively.

## 2018-06-09 DIAGNOSIS — K439 Ventral hernia without obstruction or gangrene: Secondary | ICD-10-CM

## 2018-06-09 NOTE — H&P (Signed)
Chief Complaint:  Panniculitis and ventral hernia3  History of Present Illness:  [name] is an 48 y.o. female who has seen Dr. Ulice Bold and me regarding her ventral herniae and her panniculitis.  She has some burning in her right lower quadrant and may have a RIH as well.  This might be better approached after panniculectomy.    Past Medical History:  Diagnosis Date  . Anxiety   . Arthritis   . Asthma   . Depression   . Endometriosis t  . Gastric bypass status for obesity t  . GERD (gastroesophageal reflux disease) t  . Hernia, diaphragmatic t  . Hx of hysterectomy t  . Hypertension   . PONV (postoperative nausea and vomiting)   . S/P cholecystectomy t    Past Surgical History:  Procedure Laterality Date  . ABDOMINAL HYSTERECTOMY    . ABDOMINAL SURGERY    . BREAST REDUCTION SURGERY Bilateral 01/09/2016   Procedure: MAMMARY REDUCTION  (BREAST) BILATERAL;  Surgeon: Peggye Form, DO;  Location: [facility];  Service: Plastics;  Laterality: Bilateral;  . CHOLECYSTECTOMY    . HERNIA REPAIR     hiatal hernia x3, abd hernia  . HIATAL HERNIA REPAIR     was done with lapband on two occasions.  [employer] mesh was used and removed  . lap band removal    . LAPAROSCOPIC GASTRIC BANDING  2005   was done with hiatus hernia repair.  redone in 2006   . UMBILICAL HERNIA REPAIR  01/10/11   emergency hernia repair due to hernia twisted in intestines     No current facility-administered medications for this encounter.    Current Outpatient Medications  Medication Sig Dispense Refill  . Adalimumab (HUMIRA PEN) 40 MG/0.8ML PNKT Inject into the skin.    Marland Kitchen albuterol (PROVENTIL HFA) 108 (90 Base) MCG/ACT inhaler inhale 2 puff by inhalation route  every  6 hours as needed for shortness of breath    . albuterol (PROVENTIL HFA;VENTOLIN HFA) 108 (90 BASE) MCG/ACT inhaler Inhale 2 puffs into the lungs every 6 (six) hours as needed. For shortness of breath    . ALPRAZolam  (XANAX) 0.5 MG tablet Take 1/2 tablet by mouth as needed    . azelastine (ASTELIN) 137 MCG/SPRAY nasal spray Place 2 sprays into both nostrils 2 (two) times daily as needed for rhinitis. Use in each nostril as directed    . buPROPion (WELLBUTRIN) 100 MG tablet Take 100 mg by mouth 1 day or 1 dose.    . cetirizine (ZYRTEC) 10 MG tablet Take 1 tablet by mouth daily.    . diclofenac (VOLTAREN) 75 MG EC tablet Take 1 tablet by mouth 2 (two) times daily.    Marland Kitchen gabapentin (NEURONTIN) 100 MG capsule Take 1 capsule (100 mg total) by mouth 2 (two) times daily. For agitation 60 capsule 0  . mirtazapine (REMERON) 15 MG tablet Take 1 tablet (15 mg total) by mouth at bedtime. For mood control 30 tablet 0  . montelukast (SINGULAIR) 10 MG tablet one tablet at bedtime    . Multiple Vitamin (MULTIVITAMIN) tablet Take 1 tablet by mouth daily.    Marland Kitchen olmesartan (BENICAR) 40 MG tablet Take 1 tablet by mouth daily.    . pantoprazole (PROTONIX) 40 MG tablet Take 40 mg by mouth daily.    . promethazine (PHENERGAN) 25 MG tablet Take 1 tablet by mouth daily as needed.    . RESTASIS 0.05 % ophthalmic emulsion Use as directed.    Marland Kitchen  traZODone (DESYREL) 50 MG tablet Take 1 tablet (50 mg total) by mouth at bedtime as needed for sleep. For sleep 30 tablet 0  . venlafaxine XR (EFFEXOR-XR) 150 MG 24 hr capsule Take 1 capsule by mouth daily.    Marland Kitchen venlafaxine XR (EFFEXOR-XR) 75 MG 24 hr capsule Take 1 capsule (75 mg total) by mouth daily. For mood control (Patient taking differently: Take 150 mg by mouth daily. For mood control) 30 capsule 0   Amoxicillin and Influenza vaccines Family History  Problem Relation Age of Onset  . Cancer Maternal Aunt        melanoma  . Diabetes Father   . Heart attack Father    Social History:   reports that she quit smoking about 3 years ago. Her smoking use included cigarettes. She smoked 1.00 pack per day. She has never used smokeless tobacco. She reports that she does not drink alcohol or use  drugs.   REVIEW OF SYSTEMS : Negative except for see problem list  Physical Exam:   There were no vitals taken for this visit. There is no height or weight on file to calculate BMI.  Gen:  WDWN WF NAD  Neurological: Alert and oriented to person, place, and time. Motor and sensory function is grossly intact  Head: Normocephalic and atraumatic.  Eyes: Conjunctivae are normal. Pupils are equal, round, and reactive to light. No scleral icterus.  Neck: Normal range of motion. Neck supple. No tracheal deviation or thyromegaly present.  Cardiovascular:  SR without murmurs or gallops.  No carotid bruits Breast:  Knot in right breast to be addressed by Dr. Ulice Bold Respiratory: Effort normal.  No respiratory distress. No chest wall tenderness. Breath sounds normal.  No wheezes, rales or rhonchi.  Abdomen:  Recurrent ventral hernia in the mid abdomen GU:  Probable RIH Musculoskeletal: Normal range of motion. Extremities are nontender. No cyanosis, edema or clubbing noted Lymphadenopathy: No cervical, preauricular, postauricular or axillary adenopathy is present Skin: Skin is warm and dry. No rash noted. No diaphoresis. No erythema. No pallor. Pscyh: Normal mood and affect. Behavior is normal. Judgment and thought content normal.   LABORATORY RESULTS: No results found for this or any previous visit (from the past 48 hour(s)).   RADIOLOGY RESULTS: No results found.  Problem List: Patient Active Problem List   Diagnosis Date Noted  . Ventral hernia 06/09/2018  . Breast, fat necrosis 05/11/2018  . Posttraumatic stress disorder 04/05/2018  . Panniculitis 03/10/2018  . Major depressive disorder, single episode, unspecified 04/02/2017  . MDD (major depressive disorder), recurrent episode, severe (HCC) 03/24/2017  . Symptomatic mammary hypertrophy 01/09/2016  . Nicotine dependence 12/25/2015    Assessment & Plan: pannicultitis and ventral hernia to be managed by Dr. Ulice Bold and me  later this month.      Matt B. Daphine Deutscher, MD, [facility] [phone] beeper [phone]  06/09/2018 11:23 AM

## 2018-06-17 NOTE — Patient Instructions (Signed)
[name]  06/17/2018       Your procedure is scheduled on:  06-28-2018   Report to [facility] Main  Entrance,  Report to admitting at  5:30 AM    Call this number if you have problems the morning of surgery [phone]        Remember: NO SOLID FOOD AFTER MIDNIGHT THE NIGHT PRIOR TO SURGERY. NOTHING BY MOUTH EXCEPT CLEAR LIQUIDS UNTIL 3 HOURS PRIOR TO SCHEULED SURGERY @4 :30 AM. PLEASE FINISH ENSURE DRINK PER SURGEON ORDER 3 HOURS PRIOR TO SCHEDULED SURGERY TIME WHICH NEEDS TO BE COMPLETED @ 4:30 AM.  NOTHING BY MOUTH AFTER 4:30 AM INCLUDING WATER, CANDY, GUM, MINTS   BRUSH YOUR TEETH MORNING OF SURGERY AND RINSE YOUR MOUTH OUT        Take these medicines the morning of surgery with A SIP OF WATER:  Bupropion (wellbutrin),  Pantoprazole (protonix),  Venlafaxine (effexor),  Albuterol inhaler if needed and bring with you day of surgery.                                   You may not have any metal on your body including hair pins and piercings               Do not wear jewelry, make-up, lotions, powders or perfumes, deodorant              Do not wear nail polish.  Do not shave  48 hours prior to surgery.                  Do not bring valuables to the hospital. [place] IS NOT             RESPONSIBLE   FOR VALUABLES.  Contacts, dentures or bridgework may not be worn into surgery.  Leave suitcase in the car. After surgery it may be brought to your room.                _____________________________________________________________________      CLEAR LIQUID DIET   Foods Allowed                                                                     Foods Excluded  Coffee and tea, regular and decaf                             liquids that you cannot  Plain Jell-O in any flavor                                             see through such as: Fruit ices (not with fruit pulp)                                     milk, soups, orange  juice  Iced Popsicles  All solid food Carbonated beverages, regular and diet                                    Cranberry, grape and apple juices Sports drinks like Gatorade Lightly seasoned clear broth or consume(fat free) Sugar, honey syrup    _____________________________________________________________________            [facility] - Preparing for Surgery Before surgery, you can play an important role.  Because skin is not sterile, your skin needs to be as free of germs as possible.  You can reduce the number of germs on your skin by washing with CHG (chlorahexidine gluconate) soap before surgery.  CHG is an antiseptic cleaner which kills germs and bonds with the skin to continue killing germs even after washing. Please DO NOT use if you have an allergy to CHG or antibacterial soaps.  If your skin becomes reddened/irritated stop using the CHG and inform your nurse when you arrive at Short Stay. Do not shave (including legs and underarms) for at least 48 hours prior to the first CHG shower.  You may shave your face/neck. Please follow these instructions carefully:  1.  Shower with CHG Soap the night before surgery and the  morning of Surgery.  2.  If you choose to wash your hair, wash your hair first as usual with your  normal  shampoo.  3.  After you shampoo, rinse your hair and body thoroughly to remove the  shampoo.                           4.  Use CHG as you would any other liquid soap.  You can apply chg directly  to the skin and wash                       Gently with a scrungie or clean washcloth.  5.  Apply the CHG Soap to your body ONLY FROM THE NECK DOWN.   Do not use on face/ open                           Wound or open sores. Avoid contact with eyes, ears mouth and genitals (private parts).                       Wash face,  Genitals (private parts) with your normal soap.             6.  Wash thoroughly, paying special attention to the area  where your surgery  will be performed.  7.  Thoroughly rinse your body with warm water from the neck down.  8.  DO NOT shower/wash with your normal soap after using and rinsing off  the CHG Soap.             9.  Pat yourself dry with a clean towel.            10.  Wear clean pajamas.            11.  Place clean sheets on your bed the night of your first shower and do not  sleep with pets. Day of Surgery : Do not apply any lotions/deodorants the morning of surgery.  Please wear clean clothes to the hospital/surgery center.  FAILURE TO FOLLOW THESE  INSTRUCTIONS MAY RESULT IN THE CANCELLATION OF YOUR SURGERY PATIENT SIGNATURE_________________________________  NURSE SIGNATURE__________________________________  ________________________________________________________________________

## 2018-06-18 ENCOUNTER — Ambulatory Visit (INDEPENDENT_AMBULATORY_CARE_PROVIDER_SITE_OTHER): Payer: Self-pay | Admitting: Plastic Surgery

## 2018-06-18 ENCOUNTER — Encounter: Payer: Self-pay | Admitting: Plastic Surgery

## 2018-06-18 ENCOUNTER — Other Ambulatory Visit: Payer: Self-pay

## 2018-06-18 VITALS — BP 118/82 | HR 100 | Temp 98.5°F | Ht 71.0 in | Wt 317.2 lb

## 2018-06-18 DIAGNOSIS — M793 Panniculitis, unspecified: Secondary | ICD-10-CM

## 2018-06-18 DIAGNOSIS — K439 Ventral hernia without obstruction or gangrene: Secondary | ICD-10-CM

## 2018-06-18 DIAGNOSIS — N62 Hypertrophy of breast: Secondary | ICD-10-CM

## 2018-06-18 MED ORDER — CEPHALEXIN 500 MG PO CAPS: 500.0000 mg | ORAL_CAPSULE | Freq: Four times a day (QID) | ORAL | 0 refills | Status: AC

## 2018-06-18 MED ORDER — HYDROCODONE-ACETAMINOPHEN 5-325 MG PO TABS: 1.0000 | ORAL_TABLET | Freq: Four times a day (QID) | ORAL | 0 refills | Status: DC | PRN

## 2018-06-18 MED ORDER — ONDANSETRON HCL 4 MG PO TABS: 4.0000 mg | ORAL_TABLET | Freq: Three times a day (TID) | ORAL | 0 refills | Status: AC | PRN

## 2018-06-18 NOTE — Progress Notes (Signed)
Patient ID: [name], female    DOB: 02-Apr-1971, 48 y.o.   MRN: [id number]   Chief Complaint  Patient presents with  . Pre-op Exam    Panniculectomy and excision of (R) breast fat necrosis    [name] is a 48 year old female here for her preoperative H&P.  She is scheduled for a combined case with Dr. Daphine DeutscherMartin for hernia repair and myself for a panniculectomy.  We will also excise the fat necrosis of the right breast areola.  She had a breast reduction a year ago and has some residual fat necrosis of the lower medial aspect in the 6 o'clock position of the right nipple area [name].  The area was biopsied and was negative.  She has 2 hernias of the abdomen that Dr. Daphine DeutscherMartin will fix.  We will assist in access to the area and then perform a panniculectomy.  She has been holding the Humira.    The patient is a 48 year old white female here for follow-up and further evaluation of her pannus and breast.  She underwent a breast reduction over a year ago she still very pleased.  She does have an area of fat necrosis on the right breast lower medial aspect.  This is tender to touch.  She underwent a biopsy of the area and it was negative.  She is also interested in a panniculectomy.  She has 2 hernias that she is planning to have fixed at the same time by Dr. Daphine DeutscherMartin.  He has fixed 1 in the past.  She complains of intermittent yeast infections in the folds of the pannus. She has been on Humira and her last dose was 2 weeks ago.  She has started taking her vitamins.  No new issues noted today.      Review of Systems  Constitutional: Negative.   HENT: Negative.  Negative for congestion.   Respiratory: Negative.   Cardiovascular: Negative for chest pain.  Gastrointestinal: Negative.  Negative for abdominal distention, abdominal pain and anal bleeding.  Endocrine: Negative.   Genitourinary: Negative.   Musculoskeletal: Negative.   Neurological: Negative.   Hematological: Negative.    Psychiatric/Behavioral: Negative.     Past Medical History:  Diagnosis Date  . Anxiety   . Arthritis   . Asthma   . Depression   . Endometriosis t  . Gastric bypass status for obesity t  . GERD (gastroesophageal reflux disease) t  . Hernia, diaphragmatic t  . Hx of hysterectomy t  . Hypertension   . PONV (postoperative nausea and vomiting)   . S/P cholecystectomy t    Past Surgical History:  Procedure Laterality Date  . ABDOMINAL HYSTERECTOMY    . ABDOMINAL SURGERY    . BREAST REDUCTION SURGERY Bilateral 01/09/2016   Procedure: MAMMARY REDUCTION  (BREAST) BILATERAL;  Surgeon: Peggye Formlaire S Dillingham, DO;  Location: [facility];  Service: Plastics;  Laterality: Bilateral;  . CHOLECYSTECTOMY    . HERNIA REPAIR     hiatal hernia x3, abd hernia  . HIATAL HERNIA REPAIR     was done with lapband on two occasions.  [employer] mesh was used and removed  . lap band removal    . LAPAROSCOPIC GASTRIC BANDING  2005   was done with hiatus hernia repair.  redone in 2006   . UMBILICAL HERNIA REPAIR  01/10/11   emergency hernia repair due to hernia twisted in intestines       Current Outpatient Medications:  .  Adalimumab (HUMIRA  PEN) 40 MG/0.8ML PNKT, Inject into the skin., Disp: , Rfl:  .  albuterol (PROVENTIL HFA;VENTOLIN HFA) 108 (90 BASE) MCG/ACT inhaler, Inhale 2 puffs into the lungs every 6 (six) hours as needed. For shortness of breath, Disp: , Rfl:  .  ALPRAZolam (XANAX) 0.5 MG tablet, Take 0.5 mg by mouth 3 (three) times daily as needed for anxiety. , Disp: , Rfl:  .  azelastine (ASTELIN) 137 MCG/SPRAY nasal spray, Place 2 sprays into both nostrils 2 (two) times daily as needed for rhinitis. Use in each nostril as directed, Disp: , Rfl:  .  b complex vitamins tablet, Take 1 tablet by mouth daily., Disp: , Rfl:  .  Biotin 38329 MCG TABS, Take 10,000 mcg by mouth daily., Disp: , Rfl:  .  buPROPion (WELLBUTRIN XL) 300 MG 24 hr tablet, Take 300 mg by mouth daily., Disp: ,  Rfl:  .  cetirizine (ZYRTEC) 10 MG tablet, Take 10 mg by mouth daily as needed for allergies. , Disp: , Rfl:  .  diclofenac (VOLTAREN) 75 MG EC tablet, Take 75 mg by mouth 2 (two) times daily. , Disp: , Rfl:  .  mirtazapine (REMERON) 15 MG tablet, Take 1 tablet (15 mg total) by mouth at bedtime. For mood control, Disp: 30 tablet, Rfl: 0 .  montelukast (SINGULAIR) 10 MG tablet, Take 10 mg by mouth at bedtime. , Disp: , Rfl:  .  Multiple Vitamin (MULTIVITAMIN) tablet, Take 1 tablet by mouth daily., Disp: , Rfl:  .  olmesartan (BENICAR) 40 MG tablet, Take 40 mg by mouth every morning. , Disp: , Rfl:  .  pantoprazole (PROTONIX) 40 MG tablet, Take 40 mg by mouth 2 (two) times daily. , Disp: , Rfl:  .  promethazine (PHENERGAN) 25 MG tablet, Take 25 mg by mouth every 8 (eight) hours as needed for nausea or vomiting. , Disp: , Rfl:  .  traZODone (DESYREL) 50 MG tablet, Take 1 tablet (50 mg total) by mouth at bedtime as needed for sleep. For sleep (Patient taking differently: Take 50 mg by mouth at bedtime. For sleep), Disp: 30 tablet, Rfl: 0 .  venlafaxine XR (EFFEXOR-XR) 150 MG 24 hr capsule, Take 1 capsule by mouth daily with breakfast. , Disp: , Rfl:  .  vitamin A 8000 UNIT capsule, Take 8,000 Units by mouth daily., Disp: , Rfl:  .  vitamin B-12 (CYANOCOBALAMIN) 1000 MCG tablet, Take 1,000 mcg by mouth daily., Disp: , Rfl:    Objective:   There were no vitals filed for this visit.  Physical Exam Vitals signs and nursing note reviewed.  Constitutional:      Appearance: Normal appearance.  HENT:     Head: Normocephalic and atraumatic.     Left Ear: External ear normal.     Nose: Nose normal.     Mouth/Throat:     Mouth: Mucous membranes are moist.  Eyes:     Extraocular Movements: Extraocular movements intact.  Neck:     Musculoskeletal: Normal range of motion.  Cardiovascular:     Rate and Rhythm: Normal rate.  Pulmonary:     Effort: Pulmonary effort is normal.  Chest:    Abdominal:      General: Abdomen is flat. There is no distension.     Tenderness: There is no guarding or rebound.     Hernia: A hernia is present.  Skin:    General: Skin is warm.     Coloration: Skin is not jaundiced.  Neurological:  General: No focal deficit present.     Mental Status: She is alert.  Psychiatric:        Mood and Affect: Mood normal.        Thought Content: Thought content normal.        Judgment: Judgment normal.     Assessment & Plan:  Symptomatic mammary hypertrophy  Ventral hernia without obstruction or gangrene  Panniculitis Plan for panniculectomy and right breast fat necrosis excision in conjunction with hernia repair by [name]. The risk that can be encountered for this procedure were discussed and include the following but not limited to these: asymmetry, fluid accumulation, firmness of the tissue, skin loss, decrease or no sensation, fat necrosis, bleeding, infection, healing delay.  Deep vein thrombosis, cardiac and pulmonary complications are risks to any procedure.  There are risks of anesthesia, changes to skin sensation and injury to nerves or blood vessels.  The muscle can be temporarily or permanently injured.  You may have an allergic reaction to tape, suture, glue, blood products which can result in skin discoloration, swelling, pain, skin lesions, poor healing.  Any of these can lead to the need for revisonal surgery or stage procedures.  Weight gain and weigh loss can also effect the long term appearance. The results are not guaranteed to last a lifetime.  Future surgery may be required.      [name] Dillingham, DO

## 2018-06-22 ENCOUNTER — Other Ambulatory Visit: Payer: Self-pay

## 2018-06-22 ENCOUNTER — Encounter (HOSPITAL_COMMUNITY): Payer: Self-pay

## 2018-06-22 ENCOUNTER — Encounter (HOSPITAL_COMMUNITY)
Admission: RE | Admit: 2018-06-22 | Discharge: 2018-06-22 | Disposition: A | Discharge status: Home / Self Care | Payer: Medicare HMO | Source: Ambulatory Visit | Attending: Surgery | Admitting: Surgery

## 2018-06-22 DIAGNOSIS — Z01812 Encounter for preprocedural laboratory examination: Secondary | ICD-10-CM | POA: Diagnosis not present

## 2018-06-22 HISTORY — DX: Presence of spectacles and contact lenses: Z97.3

## 2018-06-22 HISTORY — DX: Fat necrosis of breast: N64.1

## 2018-06-22 HISTORY — DX: Personal history of other diseases of the digestive system: Z87.19

## 2018-06-22 HISTORY — DX: Personal history of self-harm: Z91.5

## 2018-06-22 HISTORY — DX: Panniculitis, unspecified: M79.3

## 2018-06-22 HISTORY — DX: Major depressive disorder, single episode, unspecified: F32.9

## 2018-06-22 HISTORY — DX: Psoriasis, unspecified: L40.9

## 2018-06-22 LAB — CBC
HCT: 45.7 % (ref 36.0–46.0)
Hemoglobin: 14.5 g/dL (ref 12.0–15.0)
MCH: 32.7 pg (ref 26.0–34.0)
MCHC: 31.7 g/dL (ref 30.0–36.0)
MCV: 102.9 fL — ABNORMAL HIGH (ref 80.0–100.0)
Platelets: 231 10*3/uL (ref 150–400)
RBC: 4.44 MIL/uL (ref 3.87–5.11)
RDW: 13.1 % (ref 11.5–15.5)
WBC: 11.1 10*3/uL — ABNORMAL HIGH (ref 4.0–10.5)
nRBC: 0 % (ref 0.0–0.2)

## 2018-06-22 LAB — BASIC METABOLIC PANEL
Anion gap: 8 (ref 5–15)
BUN: 16 mg/dL (ref 6–20)
CO2: 27 mmol/L (ref 22–32)
Calcium: 8.9 mg/dL (ref 8.9–10.3)
Chloride: 104 mmol/L (ref 98–111)
Creatinine, Ser: 0.78 mg/dL (ref 0.44–1.00)
GFR calc Af Amer: >60 mL/min (ref >60)
GFR calc non Af Amer: >60 mL/min (ref >60)
Glucose, Bld: 99 mg/dL (ref 70–99)
POTASSIUM: 4 mmol/L (ref 3.5–5.1)
Sodium: 139 mmol/L (ref 135–145)

## 2018-06-22 MED ORDER — CHLORHEXIDINE GLUCONATE CLOTH 2 % EX PADS
6.0000 | MEDICATED_PAD | Freq: Once | CUTANEOUS | Status: DC
  Filled 2018-06-22: qty 6

## 2018-06-22 NOTE — Progress Notes (Addendum)
EKG dated 01-21-2018 in epic.  CXR dated 01-21-2018 in epic.  Chart to anesthesia for review, Jodell Cipro PA.

## 2018-06-28 ENCOUNTER — Inpatient Hospital Stay (HOSPITAL_COMMUNITY): Admission: RE | Admit: 2018-06-28 | Payer: Medicare HMO | Source: Home / Self Care | Admitting: Surgery

## 2018-07-06 ENCOUNTER — Encounter: Payer: Self-pay | Admitting: Plastic Surgery

## 2018-09-23 ENCOUNTER — Emergency Department (HOSPITAL_COMMUNITY): Payer: Medicare HMO

## 2018-09-23 ENCOUNTER — Encounter (HOSPITAL_COMMUNITY): Payer: Self-pay | Admitting: Family Medicine

## 2018-09-23 ENCOUNTER — Other Ambulatory Visit: Payer: Self-pay

## 2018-09-23 ENCOUNTER — Inpatient Hospital Stay (HOSPITAL_COMMUNITY)
Admission: EM | Admit: 2018-09-23 | Discharge: 2018-09-25 | DRG: 193 | Disposition: A | Discharge status: Home / Self Care | Payer: Medicare HMO | Attending: Internal Medicine | Admitting: Internal Medicine

## 2018-09-23 DIAGNOSIS — Z9049 Acquired absence of other specified parts of digestive tract: Secondary | ICD-10-CM

## 2018-09-23 DIAGNOSIS — Z888 Allergy status to other drugs, medicaments and biological substances status: Secondary | ICD-10-CM

## 2018-09-23 DIAGNOSIS — I493 Ventricular premature depolarization: Secondary | ICD-10-CM | POA: Diagnosis present

## 2018-09-23 DIAGNOSIS — Z981 Arthrodesis status: Secondary | ICD-10-CM

## 2018-09-23 DIAGNOSIS — Z1159 Encounter for screening for other viral diseases: Secondary | ICD-10-CM

## 2018-09-23 DIAGNOSIS — F418 Other specified anxiety disorders: Secondary | ICD-10-CM | POA: Diagnosis not present

## 2018-09-23 DIAGNOSIS — Z87891 Personal history of nicotine dependence: Secondary | ICD-10-CM

## 2018-09-23 DIAGNOSIS — J159 Unspecified bacterial pneumonia: Principal | ICD-10-CM | POA: Diagnosis present

## 2018-09-23 DIAGNOSIS — J9601 Acute respiratory failure with hypoxia: Secondary | ICD-10-CM | POA: Diagnosis present

## 2018-09-23 DIAGNOSIS — Z833 Family history of diabetes mellitus: Secondary | ICD-10-CM

## 2018-09-23 DIAGNOSIS — K219 Gastro-esophageal reflux disease without esophagitis: Secondary | ICD-10-CM | POA: Diagnosis present

## 2018-09-23 DIAGNOSIS — I10 Essential (primary) hypertension: Secondary | ICD-10-CM | POA: Diagnosis present

## 2018-09-23 DIAGNOSIS — Z887 Allergy status to serum and vaccine status: Secondary | ICD-10-CM

## 2018-09-23 DIAGNOSIS — Z6841 Body Mass Index (BMI) 40.0 and over, adult: Secondary | ICD-10-CM

## 2018-09-23 DIAGNOSIS — J189 Pneumonia, unspecified organism: Secondary | ICD-10-CM

## 2018-09-23 DIAGNOSIS — Z79899 Other long term (current) drug therapy: Secondary | ICD-10-CM

## 2018-09-23 DIAGNOSIS — Z915 Personal history of self-harm: Secondary | ICD-10-CM

## 2018-09-23 DIAGNOSIS — R000 Tachycardia, unspecified: Secondary | ICD-10-CM | POA: Insufficient documentation

## 2018-09-23 DIAGNOSIS — J181 Lobar pneumonia, unspecified organism: Secondary | ICD-10-CM

## 2018-09-23 DIAGNOSIS — L409 Psoriasis, unspecified: Secondary | ICD-10-CM | POA: Diagnosis not present

## 2018-09-23 DIAGNOSIS — M1990 Unspecified osteoarthritis, unspecified site: Secondary | ICD-10-CM | POA: Diagnosis present

## 2018-09-23 DIAGNOSIS — J45909 Unspecified asthma, uncomplicated: Secondary | ICD-10-CM | POA: Diagnosis present

## 2018-09-23 DIAGNOSIS — Z8249 Family history of ischemic heart disease and other diseases of the circulatory system: Secondary | ICD-10-CM

## 2018-09-23 DIAGNOSIS — R0602 Shortness of breath: Secondary | ICD-10-CM

## 2018-09-23 DIAGNOSIS — R739 Hyperglycemia, unspecified: Secondary | ICD-10-CM | POA: Diagnosis present

## 2018-09-23 DIAGNOSIS — Z808 Family history of malignant neoplasm of other organs or systems: Secondary | ICD-10-CM

## 2018-09-23 DIAGNOSIS — Z9884 Bariatric surgery status: Secondary | ICD-10-CM

## 2018-09-23 DIAGNOSIS — Z79891 Long term (current) use of opiate analgesic: Secondary | ICD-10-CM

## 2018-09-23 DIAGNOSIS — Z713 Dietary counseling and surveillance: Secondary | ICD-10-CM

## 2018-09-23 LAB — BASIC METABOLIC PANEL
Anion gap: 10 (ref 5–15)
BUN: 12 mg/dL (ref 6–20)
CO2: 25 mmol/L (ref 22–32)
Calcium: 8.4 mg/dL — ABNORMAL LOW (ref 8.9–10.3)
Chloride: 106 mmol/L (ref 98–111)
Creatinine, Ser: 0.74 mg/dL (ref 0.44–1.00)
GFR calc Af Amer: >60 mL/min (ref >60)
GFR calc non Af Amer: >60 mL/min (ref >60)
Glucose, Bld: 74 mg/dL (ref 70–99)
Potassium: 3.8 mmol/L (ref 3.5–5.1)
Sodium: 141 mmol/L (ref 135–145)

## 2018-09-23 LAB — CBC WITH DIFFERENTIAL/PLATELET
Abs Immature Granulocytes: 0.07 10*3/uL (ref 0.00–0.07)
Basophils Absolute: 0.1 10*3/uL (ref 0.0–0.1)
Basophils Relative: 0 %
Eosinophils Absolute: 0.5 10*3/uL (ref 0.0–0.5)
Eosinophils Relative: 4 %
HCT: 40.1 % (ref 36.0–46.0)
Hemoglobin: 13.1 g/dL (ref 12.0–15.0)
Immature Granulocytes: 1 %
Lymphocytes Relative: 22 %
Lymphs Abs: 2.9 10*3/uL (ref 0.7–4.0)
MCH: 33.7 pg (ref 26.0–34.0)
MCHC: 32.7 g/dL (ref 30.0–36.0)
MCV: 103.1 fL — ABNORMAL HIGH (ref 80.0–100.0)
Monocytes Absolute: 0.8 10*3/uL (ref 0.1–1.0)
Monocytes Relative: 6 %
Neutro Abs: 9.3 10*3/uL — ABNORMAL HIGH (ref 1.7–7.7)
Neutrophils Relative %: 67 %
Platelets: 207 10*3/uL (ref 150–400)
RBC: 3.89 MIL/uL (ref 3.87–5.11)
RDW: 13.3 % (ref 11.5–15.5)
WBC: 13.7 10*3/uL — ABNORMAL HIGH (ref 4.0–10.5)
nRBC: 0 % (ref 0.0–0.2)

## 2018-09-23 LAB — MAGNESIUM: Magnesium: 2.1 mg/dL (ref 1.7–2.4)

## 2018-09-23 LAB — SARS CORONAVIRUS 2 BY RT PCR (HOSPITAL ORDER, PERFORMED IN ~~LOC~~ HOSPITAL LAB): SARS Coronavirus 2: NEGATIVE

## 2018-09-23 LAB — LACTIC ACID, PLASMA: Lactic Acid, Venous: 1.1 mmol/L (ref 0.5–1.9)

## 2018-09-23 MED ORDER — SODIUM CHLORIDE 0.9 % IV SOLN
INTRAVENOUS | Status: DC
  Administered 2018-09-23: 21:00:00 via INTRAVENOUS

## 2018-09-23 MED ORDER — SODIUM CHLORIDE 0.9 % IV SOLN
2.0000 g | INTRAVENOUS | Status: AC
  Administered 2018-09-23 – 2018-09-25 (×3): 2 g via INTRAVENOUS
  Filled 2018-09-23: qty 2
  Filled 2018-09-23 (×2): qty 20
  Filled 2018-09-23: qty 2

## 2018-09-23 MED ORDER — SODIUM CHLORIDE 0.9 % IV BOLUS
500.0000 mL | Freq: Once | INTRAVENOUS | Status: AC
  Administered 2018-09-23: 23:00:00 500 mL via INTRAVENOUS

## 2018-09-23 MED ORDER — ACETAMINOPHEN 325 MG PO TABS
650.0000 mg | ORAL_TABLET | Freq: Once | ORAL | Status: AC
  Administered 2018-09-23: 22:00:00 650 mg via ORAL
  Filled 2018-09-23: qty 2

## 2018-09-23 MED ORDER — SODIUM CHLORIDE 0.9 % IV SOLN
500.0000 mg | INTRAVENOUS | Status: DC
  Administered 2018-09-23 – 2018-09-24 (×2): 500 mg via INTRAVENOUS
  Filled 2018-09-23 (×2): qty 500

## 2018-09-23 NOTE — H&P (Addendum)
History and Physical    [name]:[id number] DOB: 1971/01/17 DOA: 09/23/2018  PCP: Chesley Noon, MD   Patient coming from: Home   Chief Complaint: Cough, fever, fatigue   HPI: [name] is a 48 y.o. female with medical history significant for depression, anxiety, hypertension, and psoriasis, now presenting to the emergency department for evaluation of fever, cough, and fatigue.  Patient reports that she began to develop some nonspecific malaise and fatigue a couple days ago, and then woke this morning early with shortness of breath, productive cough, and right-sided pleuritic pain.  She reports intermittent fevers at home.  She denies any new leg swelling or tenderness, denies sick contacts or travel, and does not know of anyone with the novel coronavirus.  She has been off of her Humira for upcoming hernia repair that has been postponed due to the current COVID-19 pandemic.  ED Course: Upon arrival to the ED, patient is found to be afebrile, saturating low 90s on room air while at rest, tachycardic to 110, and with stable blood pressure.  Chest x-ray is concerning for pneumonia involving the right upper and right lower lobes.  Chemistry panel is unremarkable and CBC notable for leukocytosis to 13,700 and macrocytosis without anemia.  Lactic acid is reassuringly normal and COVID-19 is negative.  Blood cultures were collected and the patient was started on Rocephin and azithromycin.  She remains dyspneic at rest, has difficulty with minimal exertion, and will be observed for ongoing evaluation and management.  Review of Systems:  All other systems reviewed and apart from HPI, are negative.  Past Medical History:  Diagnosis Date  . Anxiety   . Arthritis    joints  . Asthma   . Endometriosis   . Fat necrosis of breast    right  . GERD (gastroesophageal reflux disease)   . History of hiatal hernia    s/p repair few times  . History of suicide attempt 03/2017  .  Hypertension   . MDD (major depressive disorder)   . Panniculitis   . PONV (postoperative nausea and vomiting)   . Psoriasis    treated with Humeria  . Wears glasses     Past Surgical History:  Procedure Laterality Date  . ACHILLES TENDON REPAIR Left 01-11-2015  @[facility]   . BREAST REDUCTION SURGERY Bilateral 01/09/2016   Procedure: MAMMARY REDUCTION  (BREAST) BILATERAL;  Surgeon: Wallace Going, DO;  Location: [facility];  Service: Plastics;  Laterality: Bilateral;  . CARPOMETACARPAL (CMC) FUSION OF THUMB Right 05-04-2015   @[facility]   . COMBINED HYSTEROSCOPY DIAGNOSTIC / D&C  06-23-2003     dr Ulanda Edison @[facility]   . D & C HYSTERSCOPY /  DX LAPAROSCOPY CONVERSION LAPAROTOMY LYSIS ADHESIONS WITH RIGHT SALPINECTOMY  12-26-2003    dr Quincy Simmonds  @[facility]   . LAPAROSCOPIC CHOLECYSTECTOMY  10-19-2002   dr Deon Pilling  @[facility]   . LAPAROSCOPIC GASTRIC BANDING  09-25-2003  @[facility]    WITH HIATAL HERNIA REPAIR  . LAPAROSCOPIC REPAIR AND REMOVAL OF GASTRIC BAND  02-06-2006  @[facility]   . LAPAROTOMY LYSIS ADHESIONS , UTERINE BIOPSY  01-31-2000   dr Corinna Capra  @[facility]   . OPEN WOUND DEBRIDEMENT ANTERIOR ABDOMINAL WALL , REMOVAL FORGEIN BODY'S  05-10-2001   dr Deon Pilling  @[facility]   . REPAIR RECURRENT  HIATAL HERNIA   04-23-2004  @[facility]   . TAKEDOWN RECURRENT HIATAL HERNIA FROM PREVIOUS MESH REPAIR OF DIAPHRAGM/ REPAIR HIATAL HERNIA AND THE GASTROPEXY  11/05/2004  . TONSILLECTOMY  age 34  . TOTAL ABDOMINAL HYSTERECTOMY  W/ BILATERAL SALPINGOOPHORECTOMY  12-23-2005   dr soper   W/  BILATERAL URETEROLYSIS AND EXTENSIVE ABD. LYSIS ADHESIONS  . UMBILICAL HERNIA REPAIR  12/27/2009   LAPAROSCOPY TAKEDOWN INCARCERATED UMBILICAL HERNIA WITH REPAIR/ OPEN REPAIR COMPLEX LOWER INCISIONAL HERNIA     reports that she quit smoking about 3 years ago. Her smoking use included cigarettes. She has a 20.00 pack-year smoking history. She has never used smokeless tobacco. She reports that she does not drink alcohol or use drugs.  Allergies  Allergen Reactions  .  Influenza Vaccines     States her "arm had a knot and turned red"  . Lisinopril-Hydrochlorothiazide     welts    Family History  Problem Relation Age of Onset  . Cancer Maternal Aunt        melanoma  . Diabetes Father   . Heart attack Father      Prior to Admission medications   Medication Sig Start Date End Date Taking? Authorizing Provider  Adalimumab (HUMIRA PEN) 40 MG/0.8ML PNKT Inject into the skin. 06/25/17   [provider]  albuterol (PROVENTIL HFA;VENTOLIN HFA) 108 (90 BASE) MCG/ACT inhaler Inhale 2 puffs into the lungs every 6 (six) hours as needed. For shortness of breath    [provider]  ALPRAZolam (XANAX) 0.5 MG tablet Take 0.5 mg by mouth 3 (three) times daily as needed for anxiety.  05/06/18   [provider]  azelastine (ASTELIN) 137 MCG/SPRAY nasal spray Place 2 sprays into both nostrils 2 (two) times daily as needed for rhinitis. Use in each nostril as directed    [provider]  b complex vitamins tablet Take 1 tablet by mouth daily.    [provider]  Biotin 4098110000 MCG TABS Take 10,000 mcg by mouth daily.    [provider]  buPROPion (WELLBUTRIN XL) 300 MG 24 hr tablet Take 300 mg by mouth daily.    [provider]  cetirizine (ZYRTEC) 10 MG tablet Take 10 mg by mouth daily as needed for allergies.     [provider]  diclofenac (VOLTAREN) 75 MG EC tablet Take 75 mg by mouth 2 (two) times daily.  05/04/18   [provider]  HYDROcodone-acetaminophen (NORCO) 5-325 MG tablet Take 1 tablet by mouth every 6 (six) hours as needed for moderate pain. 06/18/18   Dillingham, Alena Billslaire S, DO  mirtazapine (REMERON) 15 MG tablet Take 1 tablet (15 mg total) by mouth at bedtime. For mood control 03/27/17   Money, Feliz Beamravis B, FNP  montelukast (SINGULAIR) 10 MG tablet Take 10 mg by mouth at bedtime.  02/04/17   [provider]  Multiple Vitamin (MULTIVITAMIN) tablet Take 1 tablet by mouth daily.     [provider]  olmesartan (BENICAR) 40 MG tablet Take 40 mg by mouth every morning.  04/13/17   [provider]  pantoprazole (PROTONIX) 40 MG tablet Take 40 mg by mouth 2 (two) times daily.     [provider]  promethazine (PHENERGAN) 25 MG tablet Take 25 mg by mouth every 8 (eight) hours as needed for nausea or vomiting.  12/31/17   [provider]  traZODone (DESYREL) 50 MG tablet Take 1 tablet (50 mg total) by mouth at bedtime as needed for sleep. For sleep Patient taking differently: Take 50 mg by mouth at bedtime. For sleep 03/27/17   Money, Gerlene Burdockravis B, FNP  venlafaxine XR (EFFEXOR-XR) 150 MG 24 hr capsule Take 1 capsule by mouth daily with breakfast.  05/04/18  [provider]  vitamin A 8000 UNIT capsule Take 8,000 Units by mouth daily.    [provider]  vitamin B-12 (CYANOCOBALAMIN) 1000 MCG tablet Take 1,000 mcg by mouth daily.    [provider]    Physical Exam: Vitals:   09/23/18 1941 09/23/18 1943 09/23/18 2100 09/23/18 2238  BP: (!) 147/94  114/69 123/67  Pulse: (!) 106 (!) 106 (!) 111 (!) 110  Resp: 19  18 (!) 21  Temp: 98.7 F (37.1 C)     TempSrc: Oral     SpO2: 93% 94% 93% 95%  Weight:  (!) 154.2 kg    Height:   (1.803 m)      Constitutional: Not in acute distress, appears uncomfortable   Eyes: PERTLA, lids and conjunctivae normal ENMT: Mucous membranes are moist. Posterior pharynx clear of any exudate or lesions.   Neck: normal, supple, no masses, no thyromegaly Respiratory: Dyspnea with speech. No pallor. Rhonchi on right at upper zone. No accessory muscle use.  Cardiovascular: Rate ~110 and regular. No extremity edema.   Abdomen: No distension, no tenderness, soft. Bowel sounds normal.  Musculoskeletal: no clubbing / cyanosis. No joint deformity upper and lower extremities.   Skin: no significant rashes, lesions, ulcers. Warm, dry, well-perfused. Neurologic: CN 2-12 grossly intact. Sensation  intact. Strength 5/5 in all 4 limbs.  Psychiatric: Alert and oriented x 3. Pleasant, cooperative.    Labs on Admission: I have personally reviewed following labs and imaging studies  CBC: Recent Labs  Lab 09/23/18 2015  WBC 13.7*  NEUTROABS 9.3*  HGB 13.1  HCT 40.1  MCV 103.1*  PLT 207   Basic Metabolic Panel: Recent Labs  Lab 09/23/18 2015  NA 141  K 3.8  CL 106  CO2 25  GLUCOSE 74  BUN 12  CREATININE 0.74  CALCIUM 8.4*   GFR: Estimated Creatinine Clearance: 139.9 mL/min (by C-G formula based on SCr of 0.74 mg/dL). Liver Function Tests: No results for input(s): AST, ALT, ALKPHOS, BILITOT, PROT, ALBUMIN in the last 168 hours. No results for input(s): LIPASE, AMYLASE in the last 168 hours. No results for input(s): AMMONIA in the last 168 hours. Coagulation Profile: No results for input(s): INR, PROTIME in the last 168 hours. Cardiac Enzymes: No results for input(s): CKTOTAL, CKMB, CKMBINDEX, TROPONINI in the last 168 hours. BNP (last 3 results) No results for input(s): PROBNP in the last 8760 hours. HbA1C: No results for input(s): HGBA1C in the last 72 hours. CBG: No results for input(s): GLUCAP in the last 168 hours. Lipid Profile: No results for input(s): CHOL, HDL, LDLCALC, TRIG, CHOLHDL, LDLDIRECT in the last 72 hours. Thyroid Function Tests: No results for input(s): TSH, T4TOTAL, FREET4, T3FREE, THYROIDAB in the last 72 hours. Anemia Panel: No results for input(s): VITAMINB12, FOLATE, FERRITIN, TIBC, IRON, RETICCTPCT in the last 72 hours. Urine analysis:    Component Value Date/Time   COLORURINE YELLOW 03/23/2017 1344   APPEARANCEUR HAZY (A) 03/23/2017 1344   LABSPEC 1.006 03/23/2017 1344   PHURINE 5.0 03/23/2017 1344   GLUCOSEU NEGATIVE 03/23/2017 1344   HGBUR NEGATIVE 03/23/2017 1344   BILIRUBINUR NEGATIVE 03/23/2017 1344   KETONESUR NEGATIVE 03/23/2017 1344   PROTEINUR NEGATIVE 03/23/2017 1344   UROBILINOGEN 0.2 02/01/2011 2209   NITRITE  NEGATIVE 03/23/2017 1344   LEUKOCYTESUR NEGATIVE 03/23/2017 1344   Sepsis Labs: (procalcitonin:4,lacticidven:4) ) Recent Results (from the past 240 hour(s))  SARS Coronavirus 2 (CEPHEID- Performed in [facility] hospital lab), [facility]  Status: None   Collection Time: 09/23/18  8:15 PM   Specimen: Nasopharyngeal Swab  Result Value Ref Range Status   SARS Coronavirus 2 NEGATIVE NEGATIVE Final    Comment: (NOTE) If result is NEGATIVE SARS-CoV-2 target nucleic acids are NOT DETECTED. The SARS-CoV-2 RNA is generally detectable in upper and lower  respiratory specimens during the acute phase of infection. The lowest  concentration of SARS-CoV-2 viral copies this assay can detect is 250  copies / mL. A negative result does not preclude SARS-CoV-2 infection  and should not be used as the sole basis for treatment or other  patient management decisions.  A negative result may occur with  improper specimen collection / handling, submission of specimen other  than nasopharyngeal swab, presence of viral mutation(s) within the  areas targeted by this assay, and inadequate number of viral copies  (<250 copies / mL). A negative result must be combined with clinical  observations, patient history, and epidemiological information. If result is POSITIVE SARS-CoV-2 target nucleic acids are DETECTED. The SARS-CoV-2 RNA is generally detectable in upper and lower  respiratory specimens dur ing the acute phase of infection.  Positive  results are indicative of active infection with SARS-CoV-2.  Clinical  correlation with patient history and other diagnostic information is  necessary to determine patient infection status.  Positive results do  not rule out bacterial infection or co-infection with other viruses. If result is PRESUMPTIVE POSTIVE SARS-CoV-2 nucleic acids MAY BE PRESENT.   A presumptive positive result was obtained on the submitted specimen  and confirmed on repeat testing.   While 2019 novel coronavirus  (SARS-CoV-2) nucleic acids may be present in the submitted sample  additional confirmatory testing may be necessary for epidemiological  and / or clinical management purposes  to differentiate between  SARS-CoV-2 and other Sarbecovirus currently known to infect humans.  If clinically indicated additional testing with an alternate test  methodology [id number]) is advised. The SARS-CoV-2 RNA is generally  detectable in upper and lower respiratory sp ecimens during the acute  phase of infection. The expected result is Negative. Fact Sheet for Patients:  BoilerBrush.com.cyhttps://www.fda.gov/media/136312/download Fact Sheet for Healthcare Providers: https://pope.com/https://www.fda.gov/media/136313/download This test is not yet approved or cleared by the [place] FDA and has been authorized for detection and/or diagnosis of SARS-CoV-2 by FDA under an Emergency Use Authorization (EUA).  This EUA will remain in effect (meaning this test can be used) for the duration of the COVID-19 declaration under Section 564(b)(1) of the Act, 21 U.S.C. section 360bbb-3(b)(1), unless the authorization is terminated or revoked sooner. Performed at [facility], [address] [place], [place] [address]   Culture, blood (Routine X 2) w Reflex to ID Panel     Status: None (Preliminary result)   Collection Time: 09/23/18  8:15 PM   Specimen: BLOOD LEFT HAND  Result Value Ref Range Status   Specimen Description   Final    BLOOD LEFT HAND Performed at [facility], [address] [place], [place] [address]    Special Requests   Final    BOTTLES DRAWN AEROBIC AND ANAEROBIC Blood Culture adequate volume Performed at [facility], [address] [place], [place] [address]    Culture PENDING  Incomplete   Report Status PENDING  Incomplete  Culture, blood (Routine X 2) w Reflex to ID Panel     Status: None (Preliminary result)   Collection Time: 09/23/18  8:20 PM    Specimen: BLOOD RIGHT HAND  Result Value Ref Range Status   Specimen Description   Final    BLOOD RIGHT HAND Performed at [facility], [address] [place], [place] [address]    Special Requests   Final    BOTTLES DRAWN AEROBIC AND ANAEROBIC Blood Culture results may not be optimal due to an excessive volume of blood received in culture bottles Performed at [facility], [address] [place], [place] [address]    Culture PENDING  Incomplete   Report Status PENDING  Incomplete     Radiological Exams on Admission: Dg Chest Port 1 View  Result Date: 09/23/2018 CLINICAL DATA:  Cough and congestion EXAM: PORTABLE CHEST 1 VIEW COMPARISON:  01/21/2018 FINDINGS: Ill-defined airspace disease in the right upper lobe and right lower lobe. Ill-defined left lower lobe airspace disease, unchanged from prior studies. No effusion. Heart size normal. IMPRESSION: Right upper lobe and right lower lobe airspace disease likely due to pneumonia. Left lower lobe scarring. Electronically Signed   By: Marlan Palauharles  Clark M.D.   On: 09/23/2018 20:47    EKG: Ordered, pending.   Assessment/Plan   1. Pneumonia  - Presents with fevers, fatigue, productive cough, SOB, and right-sided pleuritic pain  - She is found to be tachycardic and dyspneic with leukocytosis and CXR findings concerning for pneumonia involving RUL and RLL  - COVID-19 is negative and the clinical picture more consistent with bacterial PNA  - Blood cultures were collected in ED and she was started on empiric Rocephin and azithromycin   - Culture sputum, check strep pneumo antigen, continue current antibiotics, follow cultures and clinical course   2. Depression, anxiety  - Continue Effexor, Wellbutrin, Remeron, trazodone, and as-needed Ativan   3. Psoriasis  - Stable, has been off of Humira for upcoming hernia repair   4. Hypertension  - BP at goal, continue ARB as tolerated   5. Tachyarrhythmia  - Transient  tachycardia to 150 noted on monitor, appears to be narrow complex but also frequent PVC's; patient denies chest pain or palpitations, and denies hx of heart disease or arrhythmia   - Check EKG and magnesium level, continue cardiac monitoring    PPE: Mask, face shield. Patient wearing mask.  DVT prophylaxis: Lovenox  Code Status: Full  Family Communication: Discussed with patient  Consults called: None Admission status: Observation     Briscoe Deutscherimothy S Opyd, MD [facility] Pager [phone]  If 7PM-7AM, please contact night-coverage [email or web address] Password [email or web address]  09/23/2018, 10:47 PM

## 2018-09-23 NOTE — ED Notes (Signed)
Bed: WA21 Expected date:  Expected time:  Means of arrival:  Comments: 37F SOB

## 2018-09-23 NOTE — ED Notes (Signed)
ED TO INPATIENT HANDOFF REPORT  ED Nurse Name and Phone #: [place] G, [phone]  S Name/Age/Gender [name] 48 y.o. female Room/Bed: WA21/WA21  Code Status   Code Status: Prior  Home/SNF/Other Home Patient oriented to: self, place, time and situation Is this baseline? Yes   Triage Complete: Triage complete  Chief Complaint SOB  Triage Note Arrives via EMS from -C/C SOB, cough, fever, HA and fatigue. Reports fever of 104 orally today, went to UC, CXR and COVID test done, test pending. CXR showed potential pneumonia, no acute distress, diminished in bilateral lower lobes. CBG 141.    Allergies Allergies  Allergen Reactions  . Influenza Vaccines     States her "arm had a knot and turned red"  . Lisinopril-Hydrochlorothiazide     welts    Level of Care/Admitting Diagnosis ED Disposition    ED Disposition Condition Comment   Admit  Hospital Area: [facility] [[id number]]  Level of Care: Telemetry [5]  Admit to tele based on following criteria: Complex arrhythmia (Bradycardia/Tachycardia)  Covid Evaluation: Confirmed COVID Negative  Diagnosis: Pneumonia [227785]  Admitting Physician: Vianne Bulls [[id number]]  Attending Physician: Vianne Bulls [[id number]]  PT Class (Do Not Modify): Observation [104]  PT Acc Code (Do Not Modify): Observation [10022]       B Medical/Surgery History Past Medical History:  Diagnosis Date  . Anxiety   . Arthritis    joints  . Asthma   . Endometriosis   . Fat necrosis of breast    right  . GERD (gastroesophageal reflux disease)   . History of hiatal hernia    s/p repair few times  . History of suicide attempt 03/2017  . Hypertension   . MDD (major depressive disorder)   . Panniculitis   . PONV (postoperative nausea and vomiting)   . Psoriasis    treated with Humeria  . Wears glasses    Past Surgical History:  Procedure Laterality Date  . ACHILLES TENDON REPAIR Left 01-11-2015  @[facility]   . BREAST  REDUCTION SURGERY Bilateral 01/09/2016   Procedure: MAMMARY REDUCTION  (BREAST) BILATERAL;  Surgeon: Wallace Going, DO;  Location: [place];  Service: Plastics;  Laterality: Bilateral;  . CARPOMETACARPAL (CMC) FUSION OF THUMB Right 05-04-2015   @[facility]   . COMBINED HYSTEROSCOPY DIAGNOSTIC / D&C  06-23-2003     dr Ulanda Edison @[facility]   . D & C HYSTERSCOPY /  DX LAPAROSCOPY CONVERSION LAPAROTOMY LYSIS ADHESIONS WITH RIGHT SALPINECTOMY  12-26-2003    dr Quincy Simmonds  @[facility]   . LAPAROSCOPIC CHOLECYSTECTOMY  10-19-2002   dr Deon Pilling  @[facility]   . LAPAROSCOPIC GASTRIC BANDING  09-25-2003  @[facility]    WITH HIATAL HERNIA REPAIR  . LAPAROSCOPIC REPAIR AND REMOVAL OF GASTRIC BAND  02-06-2006  @[facility]   . LAPAROTOMY LYSIS ADHESIONS , UTERINE BIOPSY  01-31-2000   dr Corinna Capra  @[facility]   . OPEN WOUND DEBRIDEMENT ANTERIOR ABDOMINAL WALL , REMOVAL FORGEIN BODY'S  05-10-2001   dr Deon Pilling  @[facility]   . REPAIR RECURRENT  HIATAL HERNIA   04-23-2004  @[facility]   . TAKEDOWN RECURRENT HIATAL HERNIA FROM PREVIOUS MESH REPAIR OF DIAPHRAGM/ REPAIR HIATAL HERNIA AND THE GASTROPEXY  11/05/2004  . TONSILLECTOMY  age 56  . TOTAL ABDOMINAL HYSTERECTOMY W/ BILATERAL SALPINGOOPHORECTOMY  12-23-2005   dr soper   W/  BILATERAL URETEROLYSIS AND EXTENSIVE ABD. LYSIS ADHESIONS  . UMBILICAL HERNIA REPAIR  12/27/2009   LAPAROSCOPY TAKEDOWN INCARCERATED UMBILICAL HERNIA WITH REPAIR/ OPEN REPAIR COMPLEX LOWER INCISIONAL HERNIA  A IV Location/Drains/Wounds Patient Lines/Drains/Airways Status   Active Line/Drains/Airways    Name:   Placement date:   Placement time:   Site:   Days:   Peripheral IV 09/23/18 Left;Posterior Hand   09/23/18    1934    Hand   less than 1   Peripheral IV 09/23/18 Right Wrist   09/23/18    2107    Wrist   less than 1   Closed System Drain 1 Right Breast Bulb (JP) 19 Fr.   01/09/16    1220    Breast   988   Closed System Drain 2 Left Breast Bulb (JP) 19 Fr.   01/09/16    1405    Breast   988   Incision (Closed) 01/09/16 Breast Left   01/09/16     1251     988   Incision (Closed) 01/09/16 Breast Right   01/09/16    1251     988          Intake/Output Last 24 hours  Intake/Output Summary (Last 24 hours) at 09/23/2018 2346 Last data filed at 09/23/2018 2320 Gross per 24 hour  Intake 500 ml  Output -  Net 500 ml    Labs/Imaging Results for orders placed or performed during the hospital encounter of 09/23/18 (from the past 48 hour(s))  SARS Coronavirus 2 (CEPHEID- Performed in [facility] hospital lab), Hosp Order     Status: None   Collection Time: 09/23/18  8:15 PM   Specimen: Nasopharyngeal Swab  Result Value Ref Range   SARS Coronavirus 2 NEGATIVE NEGATIVE    Comment: (NOTE) If result is NEGATIVE SARS-CoV-2 target nucleic acids are NOT DETECTED. The SARS-CoV-2 RNA is generally detectable in upper and lower  respiratory specimens during the acute phase of infection. The lowest  concentration of SARS-CoV-2 viral copies this assay can detect is 250  copies / mL. A negative result does not preclude SARS-CoV-2 infection  and should not be used as the sole basis for treatment or other  patient management decisions.  A negative result may occur with  improper specimen collection / handling, submission of specimen other  than nasopharyngeal swab, presence of viral mutation(s) within the  areas targeted by this assay, and inadequate number of viral copies  (<250 copies / mL). A negative result must be combined with clinical  observations, patient history, and epidemiological information. If result is POSITIVE SARS-CoV-2 target nucleic acids are DETECTED. The SARS-CoV-2 RNA is generally detectable in upper and lower  respiratory specimens dur ing the acute phase of infection.  Positive  results are indicative of active infection with SARS-CoV-2.  Clinical  correlation with patient history and other diagnostic information is  necessary to determine patient infection status.  Positive results do  not rule out bacterial  infection or co-infection with other viruses. If result is PRESUMPTIVE POSTIVE SARS-CoV-2 nucleic acids MAY BE PRESENT.   A presumptive positive result was obtained on the submitted specimen  and confirmed on repeat testing.  While 2019 novel coronavirus  (SARS-CoV-2) nucleic acids may be present in the submitted sample  additional confirmatory testing may be necessary for epidemiological  and / or clinical management purposes  to differentiate between  SARS-CoV-2 and other Sarbecovirus currently known to infect humans.  If clinically indicated additional testing with an alternate test  methodology [phone]([id number]) is advised. The SARS-CoV-2 RNA is generally  detectable in upper and lower respiratory sp ecimens during the acute  phase of infection. The expected  result is Negative. Fact Sheet for Patients:  BoilerBrush.com.cyhttps://www.fda.gov/media/136312/download Fact Sheet for Healthcare Providers: https://pope.com/https://www.fda.gov/media/136313/download This test is not yet approved or cleared by the [place] FDA and has been authorized for detection and/or diagnosis of SARS-CoV-2 by FDA under an Emergency Use Authorization (EUA).  This EUA will remain in effect (meaning this test can be used) for the duration of the COVID-19 declaration under Section 564(b)(1) of the Act, 21 U.S.C. section 360bbb-3(b)(1), unless the authorization is terminated or revoked sooner. Performed at [facility], [address] [place], [place] [address]   CBC with Differential/Platelet     Status: Abnormal   Collection Time: 09/23/18  8:15 PM  Result Value Ref Range   WBC 13.7 (H) 4.0 - 10.5 K/uL   RBC 3.89 3.87 - 5.11 MIL/uL   Hemoglobin 13.1 12.0 - 15.0 g/dL   HCT 60.440.1 54.036.0 - 98.146.0 %   MCV 103.1 (H) 80.0 - 100.0 fL   MCH 33.7 26.0 - 34.0 pg   MCHC 32.7 30.0 - 36.0 g/dL   RDW 19.113.3 47.811.5 - 29.515.5 %   Platelets 207 150 - 400 K/uL   nRBC 0.0 0.0 - 0.2 %   Neutrophils Relative % 67 %   Neutro Abs 9.3 (H) 1.7 - 7.7  K/uL   Lymphocytes Relative 22 %   Lymphs Abs 2.9 0.7 - 4.0 K/uL   Monocytes Relative 6 %   Monocytes Absolute 0.8 0.1 - 1.0 K/uL   Eosinophils Relative 4 %   Eosinophils Absolute 0.5 0.0 - 0.5 K/uL   Basophils Relative 0 %   Basophils Absolute 0.1 0.0 - 0.1 K/uL   Immature Granulocytes 1 %   Abs Immature Granulocytes 0.07 0.00 - 0.07 K/uL    Comment: Performed at [facility], [address] [place], [place] [address]  Basic metabolic panel     Status: Abnormal   Collection Time: 09/23/18  8:15 PM  Result Value Ref Range   Sodium 141 135 - 145 mmol/L   Potassium 3.8 3.5 - 5.1 mmol/L   Chloride 106 98 - 111 mmol/L   CO2 25 22 - 32 mmol/L   Glucose, Bld 74 70 - 99 mg/dL   BUN 12 6 - 20 mg/dL   Creatinine, Ser 8.650.74 0.44 - 1.00 mg/dL   Calcium 8.4 (L) 8.9 - 10.3 mg/dL   GFR calc non Af Amer >60 >60 mL/min   GFR calc Af Amer >60 >60 mL/min   Anion gap 10 5 - 15    Comment: Performed at [facility], [address] [place], [place] [address]  Culture, blood (Routine X 2) w Reflex to ID Panel     Status: None (Preliminary result)   Collection Time: 09/23/18  8:15 PM   Specimen: BLOOD LEFT HAND  Result Value Ref Range   Specimen Description      BLOOD LEFT HAND Performed at [facility], [address] [place], [place] [address]    Special Requests      BOTTLES DRAWN AEROBIC AND ANAEROBIC Blood Culture adequate volume Performed at [facility], [address] [place], [place] [address]    Culture PENDING    Report Status PENDING   Lactic acid, plasma     Status: None   Collection Time: 09/23/18  8:15 PM  Result Value Ref Range   Lactic Acid, Venous 1.1 0.5 - 1.9 mmol/L    Comment: Performed at [facility], [address] [place], [place] [address]  Culture, blood (Routine X 2) w Reflex to ID Panel     Status: None (Preliminary result)   Collection Time: 09/23/18  8:20 PM   Specimen: BLOOD  RIGHT HAND  Result Value Ref Range   Specimen Description      BLOOD RIGHT HAND Performed at [facility], [address] [place], [place] [address]    Special Requests      BOTTLES DRAWN AEROBIC AND ANAEROBIC Blood Culture results may not be optimal due to an excessive volume of blood received in culture bottles Performed at [facility], [address] [place], [place] [address]    Culture PENDING    Report Status PENDING   Magnesium     Status: None   Collection Time: 09/23/18 11:21 PM  Result Value Ref Range   Magnesium 2.1 1.7 - 2.4 mg/dL    Comment: Performed at [facility], [address] [place], [place] [address]   Dg Chest Port 1 View  Result Date: 09/23/2018 CLINICAL DATA:  Cough and congestion EXAM: PORTABLE CHEST 1 VIEW COMPARISON:  01/21/2018 FINDINGS: Ill-defined airspace disease in the right upper lobe and right lower lobe. Ill-defined left lower lobe airspace disease, unchanged from prior studies. No effusion. Heart size normal. IMPRESSION: Right upper lobe and right lower lobe airspace disease likely due to pneumonia. Left lower lobe scarring. Electronically Signed   By: Marlan Palauharles  Clark M.D.   On: 09/23/2018 20:47    Pending Labs [employer] (From admission, onward)    Start     Ordered   Signed and Held  HIV antibody (Routine Testing)  Tomorrow morning,   R     Signed and Held   Signed and Held  Creatinine, serum  (enoxaparin (LOVENOX)    CrCl >/= 30 ml/min)  Weekly,   R    Comments: while on enoxaparin therapy    Signed and Held   Signed and Held  Basic metabolic panel  Tomorrow morning,   R     Signed and Held   Signed and Held  CBC WITH DIFFERENTIAL  Tomorrow morning,   R     Signed and Held   Signed and Held  Culture, sputum-assessment  Once,   R    Question:  Patient immune status  Answer:  Immunocompromised   Signed and Held   Signed and Held  Legionella Pneumophila Serogp 1 Ur Ag  Once,   R     Signed  and Held   Signed and Held  Strep pneumoniae urinary antigen  Once,   R     Signed and Held          Vitals/Pain Today's Vitals   09/23/18 2100 09/23/18 2238 09/23/18 2252 09/23/18 2300  BP: 114/69 123/67  105/70  Pulse: (!) 111 (!) 110  (!) 114  Resp: 18 (!) 21  (!) 22  Temp:      TempSrc:      SpO2: 93% 95%    Weight:      Height:      PainSc:   3      Isolation Precautions No active isolations  Medications Medications  0.9 %  sodium chloride infusion ( Intravenous New Bag/Given 09/23/18 2051)  cefTRIAXone (ROCEPHIN) 2 g in sodium chloride 0.9 % 100 mL IVPB (2 g Intravenous New Bag/Given 09/23/18 2132)  azithromycin (ZITHROMAX) 500 mg in sodium chloride 0.9 % 250 mL IVPB (500 mg Intravenous New  Bag/Given 09/23/18 2135)  acetaminophen (TYLENOL) tablet 650 mg (650 mg Oral Given 09/23/18 2200)  sodium chloride 0.9 % bolus 500 mL (0 mLs Intravenous Stopped 09/23/18 2320)    Mobility walks Low fall risk

## 2018-09-23 NOTE — ED Notes (Signed)
X-ray at bedside

## 2018-09-23 NOTE — ED Notes (Signed)
Pt stood at bedside to use female urinal and stated "I feel more short of breath when I move around".

## 2018-09-23 NOTE — ED Triage Notes (Signed)
Arrives via EMS from -C/C SOB, cough, fever, HA and fatigue. Reports fever of 104 orally today, went to UC, CXR and COVID test done, test pending. CXR showed potential pneumonia, no acute distress, diminished in bilateral lower lobes. CBG 141.

## 2018-09-23 NOTE — ED Provider Notes (Signed)
[facility]-EMERGENCY DEPT Provider Note   CSN: [id number] Arrival date & time: 09/23/18  1926     History   Chief Complaint Chief Complaint  Patient presents with  . Shortness of Breath    HPI [name] is a 48 y.o. female.     [age] year old female presents with several days of cough and congestion with fever.  Went to urgent care today and was diagnosed as having pneumonia.  Had a COVID test which is pending at this time.  Endorses myalgias without headache or photophobia or neck pain.  No urinary symptoms.  Denies any sick exposures.  No treatment used prior to arrival.     Past Medical History:  Diagnosis Date  . Anxiety   . Arthritis    joints  . Asthma   . Endometriosis   . Fat necrosis of breast    right  . GERD (gastroesophageal reflux disease)   . History of hiatal hernia    s/p repair few times  . History of suicide attempt 03/2017  . Hypertension   . MDD (major depressive disorder)   . Panniculitis   . PONV (postoperative nausea and vomiting)   . Psoriasis    treated with Humeria  . Wears glasses     Patient Active Problem List   Diagnosis Date Noted  . Ventral hernia 06/09/2018  . Breast, fat necrosis 05/11/2018  . Posttraumatic stress disorder 04/05/2018  . Panniculitis 03/10/2018  . Major depressive disorder, single episode, unspecified 04/02/2017  . MDD (major depressive disorder), recurrent episode, severe (HCC) 03/24/2017  . Symptomatic mammary hypertrophy 01/09/2016  . Nicotine dependence 12/25/2015    Past Surgical History:  Procedure Laterality Date  . ACHILLES TENDON REPAIR Left 01-11-2015  @[facility]   . BREAST REDUCTION SURGERY Bilateral 01/09/2016   Procedure: MAMMARY REDUCTION  (BREAST) BILATERAL;  Surgeon: Peggye Formlaire S Dillingham, DO;  Location: [facility];  Service: Plastics;  Laterality: Bilateral;  . CARPOMETACARPAL (CMC) FUSION OF THUMB Right 05-04-2015   @[facility]   . COMBINED HYSTEROSCOPY  DIAGNOSTIC / D&C  06-23-2003     dr Ambrose Mantlehenley @[facility]   . D & C HYSTERSCOPY /  DX LAPAROSCOPY CONVERSION LAPAROTOMY LYSIS ADHESIONS WITH RIGHT SALPINECTOMY  12-26-2003    dr Edward Jollysilva  @[facility]   . LAPAROSCOPIC CHOLECYSTECTOMY  10-19-2002   dr Orson Slickbowman  @[facility]   . LAPAROSCOPIC GASTRIC BANDING  09-25-2003  @[facility]    WITH HIATAL HERNIA REPAIR  . LAPAROSCOPIC REPAIR AND REMOVAL OF GASTRIC BAND  02-06-2006  @[facility]   . LAPAROTOMY LYSIS ADHESIONS , UTERINE BIOPSY  01-31-2000   dr Rana Snarelowe  @[facility]   . OPEN WOUND DEBRIDEMENT ANTERIOR ABDOMINAL WALL , REMOVAL FORGEIN BODY'S  05-10-2001   dr Orson Slickbowman  @[facility]   . REPAIR RECURRENT  HIATAL HERNIA   04-23-2004  @[facility]   . TAKEDOWN RECURRENT HIATAL HERNIA FROM PREVIOUS MESH REPAIR OF DIAPHRAGM/ REPAIR HIATAL HERNIA AND THE GASTROPEXY  11/05/2004  . TONSILLECTOMY  age 89  . TOTAL ABDOMINAL HYSTERECTOMY W/ BILATERAL SALPINGOOPHORECTOMY  12-23-2005   dr soper   W/  BILATERAL URETEROLYSIS AND EXTENSIVE ABD. LYSIS ADHESIONS  . UMBILICAL HERNIA REPAIR  12/27/2009   LAPAROSCOPY TAKEDOWN INCARCERATED UMBILICAL HERNIA WITH REPAIR/ OPEN REPAIR COMPLEX LOWER INCISIONAL HERNIA     OB History   No obstetric history on file.      Home Medications    Prior to Admission medications   Medication Sig Start Date End Date Taking? Authorizing Provider  Adalimumab (HUMIRA PEN) 40 MG/0.8ML PNKT Inject into the skin. 06/25/17  [provider]  albuterol (PROVENTIL HFA;VENTOLIN HFA) 108 (90 BASE) MCG/ACT inhaler Inhale 2 puffs into the lungs every 6 (six) hours as needed. For shortness of breath    [provider]  ALPRAZolam (XANAX) 0.5 MG tablet Take 0.5 mg by mouth 3 (three) times daily as needed for anxiety.  05/06/18   [provider]  azelastine (ASTELIN) 137 MCG/SPRAY nasal spray Place 2 sprays into both nostrils 2 (two) times daily as needed for rhinitis. Use in each nostril as directed    [provider]  b complex vitamins tablet Take 1 tablet by mouth daily.    [provider]  Biotin 7829510000 MCG TABS Take 10,000 mcg by mouth daily.    [provider]  buPROPion (WELLBUTRIN XL) 300 MG 24 hr tablet Take 300 mg by mouth daily.    [provider]  cetirizine (ZYRTEC) 10 MG tablet Take 10 mg by mouth daily as needed for allergies.     [provider]  diclofenac (VOLTAREN) 75 MG EC tablet Take 75 mg by mouth 2 (two) times daily.  05/04/18   [provider]  HYDROcodone-acetaminophen (NORCO) 5-325 MG tablet Take 1 tablet by mouth every 6 (six) hours as needed for moderate pain. 06/18/18   Dillingham, Alena Billslaire S, DO  mirtazapine (REMERON) 15 MG tablet Take 1 tablet (15 mg total) by mouth at bedtime. For mood control 03/27/17   Money, Feliz Beamravis B, FNP  montelukast (SINGULAIR) 10 MG tablet Take 10 mg by mouth at bedtime.  02/04/17   [provider]  Multiple Vitamin (MULTIVITAMIN) tablet Take 1 tablet by mouth daily.    [provider]  olmesartan (BENICAR) 40 MG tablet Take 40 mg by mouth every morning.  04/13/17   [provider]  pantoprazole (PROTONIX) 40 MG tablet Take 40 mg by mouth 2 (two) times daily.     [provider]  promethazine (PHENERGAN) 25 MG tablet Take 25 mg by mouth every 8 (eight) hours as needed for nausea or vomiting.  12/31/17   [provider]  traZODone (DESYREL) 50 MG tablet Take 1 tablet (50 mg total) by mouth at bedtime as needed for sleep. For sleep Patient taking differently: Take 50 mg by mouth at bedtime. For sleep 03/27/17   Money, Gerlene Burdockravis B, FNP  venlafaxine XR (EFFEXOR-XR) 150 MG 24 hr capsule Take 1 capsule by mouth daily with breakfast.  05/04/18   [provider]  vitamin A 8000 UNIT capsule Take 8,000 Units by mouth daily.    [provider]  vitamin B-12 (CYANOCOBALAMIN) 1000 MCG tablet Take 1,000 mcg by mouth daily.    [provider]    Family History Family History  Problem Relation Age of Onset  . Cancer Maternal Aunt         melanoma  . Diabetes Father   . Heart attack Father     Social History Social History   Tobacco Use  . Smoking status: Former Smoker    Packs/day: 1.00    Years: 20.00    Pack years: 20.00    Types: Cigarettes    Quit date: 05/20/2015    Years since quitting: 3.3  . Smokeless tobacco: Never Used  Substance Use Topics  . Alcohol use: No  . Drug use: Never     Allergies   Influenza vaccines and Lisinopril-hydrochlorothiazide   Review of Systems Review of Systems  All other systems reviewed and are negative.    Physical Exam Updated  Vital Signs BP (!) 147/94 (BP Location: Right Arm)   Pulse (!) 106   Temp 98.7 F (37.1 C) (Oral)   Resp 19   Ht 1.803 m (5\' 11" )   Wt (!) 154.2 kg   SpO2 94%   BMI 47.42 kg/m   Physical Exam Vitals signs and nursing note reviewed.  Constitutional:      General: She is not in acute distress.    Appearance: Normal appearance. She is well-developed. She is not toxic-appearing.  HENT:     Head: Normocephalic and atraumatic.  Eyes:     General: Lids are normal.     Conjunctiva/sclera: Conjunctivae normal.     Pupils: Pupils are equal, round, and reactive to light.  Neck:     Musculoskeletal: Normal range of motion and neck supple.     Thyroid: No thyroid mass.     Trachea: No tracheal deviation.  Cardiovascular:     Rate and Rhythm: Normal rate and regular rhythm.     Heart sounds: Normal heart sounds. No murmur. No gallop.   Pulmonary:     Effort: Pulmonary effort is normal. No respiratory distress.     Breath sounds: Normal breath sounds. No stridor. No decreased breath sounds, wheezing, rhonchi or rales.  Abdominal:     General: Bowel sounds are normal. There is no distension.     Palpations: Abdomen is soft.     Tenderness: There is no abdominal tenderness. There is no rebound.  Musculoskeletal: Normal range of motion.        General: No tenderness.  Skin:    General: Skin is warm and dry.     Findings: No abrasion  or rash.  Neurological:     Mental Status: She is alert and oriented to person, place, and time.     GCS: GCS eye subscore is 4. GCS verbal subscore is 5. GCS motor subscore is 6.     Cranial Nerves: No cranial nerve deficit.     Sensory: No sensory deficit.  Psychiatric:        Speech: Speech normal.        Behavior: Behavior normal.      ED Treatments / Results  Labs (all labs ordered are listed, but only abnormal results are displayed) Labs Reviewed  SARS CORONAVIRUS 2 ([facility])  CULTURE, BLOOD (ROUTINE X 2)  CULTURE, BLOOD (ROUTINE X 2)  CBC WITH DIFFERENTIAL/PLATELET  BASIC METABOLIC PANEL  LACTIC ACID, PLASMA    EKG    Radiology No results found.  Procedures Procedures (including critical care time)  Medications Ordered in ED Medications  0.9 %  sodium chloride infusion (has no administration in time range)     Initial Impression / Assessment and Plan / ED Course  I have reviewed the triage vital signs and the nursing notes.  Pertinent labs & imaging results that were available during my care of the patient were reviewed by me and considered in my medical decision making (see chart for details).        Patient has evidence of pneumonia on chest x-ray.  She is short of breath here.  She has been placed on oxygen.  Started on IV antibiotics.  COVID test is negative.  Will be admitted to the hospitalist service  VONCILLE SIMM was evaluated in Emergency Department on 09/23/2018 for the symptoms described in the history of present illness. She was evaluated in the context of the global COVID-19 pandemic, which  necessitated consideration that the patient might be at risk for infection with the SARS-CoV-2 virus that causes COVID-19. Institutional protocols and algorithms that pertain to the evaluation of patients at risk for COVID-19 are in a state of rapid change based on information released by regulatory bodies  including the CDC and federal and state organizations. These policies and algorithms were followed during the patient's care in the ED.   Final Clinical Impressions(s) / ED Diagnoses   Final diagnoses:  None    ED Discharge Orders    None       Lorre NickAllen, Kathan Kirker, MD 09/23/18 2226

## 2018-09-24 ENCOUNTER — Encounter (HOSPITAL_COMMUNITY): Payer: Self-pay | Admitting: Nurse Practitioner

## 2018-09-24 ENCOUNTER — Other Ambulatory Visit: Payer: Self-pay

## 2018-09-24 ENCOUNTER — Observation Stay (HOSPITAL_COMMUNITY): Payer: Medicare HMO

## 2018-09-24 DIAGNOSIS — Z887 Allergy status to serum and vaccine status: Secondary | ICD-10-CM | POA: Diagnosis not present

## 2018-09-24 DIAGNOSIS — F418 Other specified anxiety disorders: Secondary | ICD-10-CM | POA: Diagnosis present

## 2018-09-24 DIAGNOSIS — J9601 Acute respiratory failure with hypoxia: Secondary | ICD-10-CM | POA: Diagnosis present

## 2018-09-24 DIAGNOSIS — Z1159 Encounter for screening for other viral diseases: Secondary | ICD-10-CM | POA: Diagnosis not present

## 2018-09-24 DIAGNOSIS — J159 Unspecified bacterial pneumonia: Secondary | ICD-10-CM | POA: Diagnosis present

## 2018-09-24 DIAGNOSIS — I493 Ventricular premature depolarization: Secondary | ICD-10-CM | POA: Diagnosis present

## 2018-09-24 DIAGNOSIS — K219 Gastro-esophageal reflux disease without esophagitis: Secondary | ICD-10-CM | POA: Diagnosis present

## 2018-09-24 DIAGNOSIS — J181 Lobar pneumonia, unspecified organism: Secondary | ICD-10-CM | POA: Diagnosis not present

## 2018-09-24 DIAGNOSIS — Z808 Family history of malignant neoplasm of other organs or systems: Secondary | ICD-10-CM | POA: Diagnosis not present

## 2018-09-24 DIAGNOSIS — J189 Pneumonia, unspecified organism: Secondary | ICD-10-CM | POA: Diagnosis present

## 2018-09-24 DIAGNOSIS — Z9884 Bariatric surgery status: Secondary | ICD-10-CM | POA: Diagnosis not present

## 2018-09-24 DIAGNOSIS — Z833 Family history of diabetes mellitus: Secondary | ICD-10-CM | POA: Diagnosis not present

## 2018-09-24 DIAGNOSIS — M1990 Unspecified osteoarthritis, unspecified site: Secondary | ICD-10-CM | POA: Diagnosis present

## 2018-09-24 DIAGNOSIS — R739 Hyperglycemia, unspecified: Secondary | ICD-10-CM | POA: Diagnosis present

## 2018-09-24 DIAGNOSIS — Z79891 Long term (current) use of opiate analgesic: Secondary | ICD-10-CM | POA: Diagnosis not present

## 2018-09-24 DIAGNOSIS — Z981 Arthrodesis status: Secondary | ICD-10-CM | POA: Diagnosis not present

## 2018-09-24 DIAGNOSIS — I10 Essential (primary) hypertension: Secondary | ICD-10-CM | POA: Diagnosis present

## 2018-09-24 DIAGNOSIS — Z9049 Acquired absence of other specified parts of digestive tract: Secondary | ICD-10-CM | POA: Diagnosis not present

## 2018-09-24 DIAGNOSIS — Z915 Personal history of self-harm: Secondary | ICD-10-CM | POA: Diagnosis not present

## 2018-09-24 DIAGNOSIS — L409 Psoriasis, unspecified: Secondary | ICD-10-CM | POA: Diagnosis present

## 2018-09-24 DIAGNOSIS — Z87891 Personal history of nicotine dependence: Secondary | ICD-10-CM | POA: Diagnosis not present

## 2018-09-24 DIAGNOSIS — Z713 Dietary counseling and surveillance: Secondary | ICD-10-CM | POA: Diagnosis not present

## 2018-09-24 DIAGNOSIS — Z888 Allergy status to other drugs, medicaments and biological substances status: Secondary | ICD-10-CM | POA: Diagnosis not present

## 2018-09-24 DIAGNOSIS — Z6841 Body Mass Index (BMI) 40.0 and over, adult: Secondary | ICD-10-CM | POA: Diagnosis not present

## 2018-09-24 DIAGNOSIS — Z8249 Family history of ischemic heart disease and other diseases of the circulatory system: Secondary | ICD-10-CM | POA: Diagnosis not present

## 2018-09-24 LAB — CBC WITH DIFFERENTIAL/PLATELET
Abs Immature Granulocytes: 0.05 10*3/uL (ref 0.00–0.07)
Basophils Absolute: 0.1 10*3/uL (ref 0.0–0.1)
Basophils Relative: 1 %
Eosinophils Absolute: 0.5 10*3/uL (ref 0.0–0.5)
Eosinophils Relative: 4 %
HCT: 38.9 % (ref 36.0–46.0)
Hemoglobin: 12.4 g/dL (ref 12.0–15.0)
Immature Granulocytes: 0 %
Lymphocytes Relative: 29 %
Lymphs Abs: 3.4 10*3/uL (ref 0.7–4.0)
MCH: 33.7 pg (ref 26.0–34.0)
MCHC: 31.9 g/dL (ref 30.0–36.0)
MCV: 105.7 fL — ABNORMAL HIGH (ref 80.0–100.0)
Monocytes Absolute: 0.6 10*3/uL (ref 0.1–1.0)
Monocytes Relative: 5 %
Neutro Abs: 7.1 10*3/uL (ref 1.7–7.7)
Neutrophils Relative %: 61 %
Platelets: 185 10*3/uL (ref 150–400)
RBC: 3.68 MIL/uL — ABNORMAL LOW (ref 3.87–5.11)
RDW: 13.2 % (ref 11.5–15.5)
WBC: 11.7 10*3/uL — ABNORMAL HIGH (ref 4.0–10.5)
nRBC: 0 % (ref 0.0–0.2)

## 2018-09-24 LAB — EXPECTORATED SPUTUM ASSESSMENT W GRAM STAIN, RFLX TO RESP C

## 2018-09-24 LAB — STREP PNEUMONIAE URINARY ANTIGEN: Strep Pneumo Urinary Antigen: NEGATIVE

## 2018-09-24 LAB — BASIC METABOLIC PANEL
Anion gap: 8 (ref 5–15)
BUN: 12 mg/dL (ref 6–20)
CO2: 26 mmol/L (ref 22–32)
Calcium: 7.9 mg/dL — ABNORMAL LOW (ref 8.9–10.3)
Chloride: 106 mmol/L (ref 98–111)
Creatinine, Ser: 0.83 mg/dL (ref 0.44–1.00)
GFR calc Af Amer: >60 mL/min (ref >60)
GFR calc non Af Amer: >60 mL/min (ref >60)
Glucose, Bld: 115 mg/dL — ABNORMAL HIGH (ref 70–99)
Potassium: 3.7 mmol/L (ref 3.5–5.1)
Sodium: 140 mmol/L (ref 135–145)

## 2018-09-24 MED ORDER — ACETAMINOPHEN 325 MG PO TABS
650.0000 mg | ORAL_TABLET | Freq: Four times a day (QID) | ORAL | Status: DC | PRN
  Administered 2018-09-24 – 2018-09-25 (×2): 650 mg via ORAL
  Filled 2018-09-24 (×2): qty 2

## 2018-09-24 MED ORDER — IPRATROPIUM BROMIDE 0.02 % IN SOLN
0.5000 mg | Freq: Three times a day (TID) | RESPIRATORY_TRACT | Status: DC
  Administered 2018-09-24 – 2018-09-25 (×3): 0.5 mg via RESPIRATORY_TRACT
  Filled 2018-09-24 (×3): qty 2.5

## 2018-09-24 MED ORDER — SODIUM CHLORIDE 0.9% FLUSH
3.0000 mL | Freq: Two times a day (BID) | INTRAVENOUS | Status: DC
  Administered 2018-09-24 – 2018-09-25 (×4): 3 mL via INTRAVENOUS

## 2018-09-24 MED ORDER — HYDROCODONE-ACETAMINOPHEN 5-325 MG PO TABS
1.0000 | ORAL_TABLET | ORAL | Status: DC | PRN
  Administered 2018-09-24 (×2): 2 via ORAL
  Administered 2018-09-25: 1 via ORAL
  Filled 2018-09-24 (×2): qty 2
  Filled 2018-09-24: qty 1

## 2018-09-24 MED ORDER — ENOXAPARIN SODIUM 80 MG/0.8ML ~~LOC~~ SOLN
80.0000 mg | Freq: Every day | SUBCUTANEOUS | Status: DC
  Administered 2018-09-24 (×2): 80 mg via SUBCUTANEOUS
  Filled 2018-09-24 (×3): qty 0.8

## 2018-09-24 MED ORDER — LEVALBUTEROL HCL 0.63 MG/3ML IN NEBU
0.6300 mg | INHALATION_SOLUTION | Freq: Three times a day (TID) | RESPIRATORY_TRACT | Status: DC
  Administered 2018-09-24 – 2018-09-25 (×3): 0.63 mg via RESPIRATORY_TRACT
  Filled 2018-09-24 (×3): qty 3

## 2018-09-24 MED ORDER — FUROSEMIDE 10 MG/ML IJ SOLN
40.0000 mg | Freq: Once | INTRAMUSCULAR | Status: AC
  Administered 2018-09-24: 40 mg via INTRAVENOUS
  Filled 2018-09-24: qty 4

## 2018-09-24 MED ORDER — LEVALBUTEROL HCL 0.63 MG/3ML IN NEBU
0.6300 mg | INHALATION_SOLUTION | Freq: Four times a day (QID) | RESPIRATORY_TRACT | Status: DC
  Administered 2018-09-24: 15:00:00 0.63 mg via RESPIRATORY_TRACT
  Filled 2018-09-24: qty 3

## 2018-09-24 MED ORDER — ONDANSETRON HCL 4 MG/2ML IJ SOLN
4.0000 mg | Freq: Four times a day (QID) | INTRAMUSCULAR | Status: DC | PRN
  Administered 2018-09-24: 4 mg via INTRAVENOUS
  Filled 2018-09-24: qty 2

## 2018-09-24 MED ORDER — MONTELUKAST SODIUM 10 MG PO TABS
10.0000 mg | ORAL_TABLET | Freq: Every day | ORAL | Status: DC
  Administered 2018-09-24 (×2): 10 mg via ORAL
  Filled 2018-09-24 (×2): qty 1

## 2018-09-24 MED ORDER — PANTOPRAZOLE SODIUM 40 MG PO TBEC
40.0000 mg | DELAYED_RELEASE_TABLET | Freq: Two times a day (BID) | ORAL | Status: DC
  Administered 2018-09-24 – 2018-09-25 (×3): 40 mg via ORAL
  Filled 2018-09-24 (×3): qty 1

## 2018-09-24 MED ORDER — VENLAFAXINE HCL ER 150 MG PO CP24
150.0000 mg | ORAL_CAPSULE | Freq: Every day | ORAL | Status: DC
  Administered 2018-09-24 – 2018-09-25 (×2): 150 mg via ORAL
  Filled 2018-09-24: qty 1

## 2018-09-24 MED ORDER — ALPRAZOLAM 0.5 MG PO TABS: 0.5000 mg | ORAL_TABLET | Freq: Three times a day (TID) | ORAL | Status: DC | PRN

## 2018-09-24 MED ORDER — IRBESARTAN 300 MG PO TABS
300.0000 mg | ORAL_TABLET | Freq: Every day | ORAL | Status: DC
  Administered 2018-09-24 – 2018-09-25 (×2): 300 mg via ORAL
  Filled 2018-09-24 (×2): qty 1

## 2018-09-24 MED ORDER — BUPROPION HCL ER (XL) 150 MG PO TB24
300.0000 mg | ORAL_TABLET | Freq: Every day | ORAL | Status: DC
  Administered 2018-09-24 – 2018-09-25 (×2): 300 mg via ORAL
  Filled 2018-09-24 (×2): qty 2

## 2018-09-24 MED ORDER — MIRTAZAPINE 15 MG PO TABS
15.0000 mg | ORAL_TABLET | Freq: Every day | ORAL | Status: DC
  Administered 2018-09-24 (×2): 15 mg via ORAL
  Filled 2018-09-24 (×2): qty 1

## 2018-09-24 MED ORDER — ALBUTEROL SULFATE (2.5 MG/3ML) 0.083% IN NEBU: 3.0000 mL | INHALATION_SOLUTION | Freq: Four times a day (QID) | RESPIRATORY_TRACT | Status: DC | PRN

## 2018-09-24 MED ORDER — TRAZODONE HCL 50 MG PO TABS
50.0000 mg | ORAL_TABLET | Freq: Every day | ORAL | Status: DC
  Administered 2018-09-24 (×2): 50 mg via ORAL
  Filled 2018-09-24 (×2): qty 1

## 2018-09-24 MED ORDER — IPRATROPIUM BROMIDE 0.02 % IN SOLN
0.5000 mg | Freq: Four times a day (QID) | RESPIRATORY_TRACT | Status: DC
  Administered 2018-09-24: 15:00:00 0.5 mg via RESPIRATORY_TRACT
  Filled 2018-09-24: qty 2.5

## 2018-09-24 MED ORDER — ACETAMINOPHEN 650 MG RE SUPP: 650.0000 mg | Freq: Four times a day (QID) | RECTAL | Status: DC | PRN

## 2018-09-24 MED ORDER — SODIUM CHLORIDE 0.9 % IV SOLN: 250.0000 mL | INTRAVENOUS | Status: DC | PRN

## 2018-09-24 MED ORDER — GUAIFENESIN ER 600 MG PO TB12
1200.0000 mg | ORAL_TABLET | Freq: Two times a day (BID) | ORAL | Status: DC
  Administered 2018-09-24 – 2018-09-25 (×3): 1200 mg via ORAL
  Filled 2018-09-24 (×3): qty 2

## 2018-09-24 MED ORDER — POLYETHYLENE GLYCOL 3350 17 G PO PACK: 17.0000 g | PACK | Freq: Every day | ORAL | Status: DC | PRN

## 2018-09-24 MED ORDER — SODIUM CHLORIDE 0.9% FLUSH: 3.0000 mL | INTRAVENOUS | Status: DC | PRN

## 2018-09-24 MED ORDER — ONDANSETRON HCL 4 MG PO TABS: 4.0000 mg | ORAL_TABLET | Freq: Four times a day (QID) | ORAL | Status: DC | PRN

## 2018-09-24 NOTE — Progress Notes (Signed)
PROGRESS NOTE    [name]  XBM:[id number] DOB: 29-Nov-1970 DOA: 09/23/2018 PCP: Eartha InchBadger, Michael C, MD   Brief Narrative:  HPI per Dr. Odie Seraimothy Opyd on 09/23/2018 [name] is a 48 y.o. female with medical history significant for depression, anxiety, hypertension, and psoriasis, now presenting to the emergency department for evaluation of fever, cough, and fatigue.  Patient reports that she began to develop some nonspecific malaise and fatigue a couple days ago, and then woke this morning early with shortness of breath, productive cough, and right-sided pleuritic pain.  She reports intermittent fevers at home.  She denies any new leg swelling or tenderness, denies sick contacts or travel, and does not know of anyone with the novel coronavirus.  She has been off of her Humira for upcoming hernia repair that has been postponed due to the current COVID-19 pandemic.  ED Course: Upon arrival to the ED, patient is found to be afebrile, saturating low 90s on room air while at rest, tachycardic to 110, and with stable blood pressure.  Chest x-ray is concerning for pneumonia involving the right upper and right lower lobes.  Chemistry panel is unremarkable and CBC notable for leukocytosis to 13,700 and macrocytosis without anemia.  Lactic acid is reassuringly normal and COVID-19 is negative.  Blood cultures were collected and the patient was started on Rocephin and azithromycin.  She remains dyspneic at rest, has difficulty with minimal exertion, and will be observed for ongoing evaluation and management.  **Interim History  She was still complaining of shortness of breath.  Medications have been adjusted and she is been given a diuretic given some lower semi-swelling.  No nausea or vomiting.  Assessment & Plan:   Principal Problem:   Pneumonia Active Problems:   Depression with anxiety   Psoriasis  Community-Acquired Pneumonia -Presented with fevers, fatigue, productive cough, SOB, and  right-sided pleuritic pain  - She is found to be tachycardic and dyspneic with leukocytosis and CXR findings concerning for pneumonia involving RUL and RLL  - COVID-19 is negative and the clinical picture more consistent with bacterial PNA  -Blood cultures were collected in ED and she was started on empiric Rocephin and Azithromycin and will continue these -WBC is trending downward and went from 13.7 is now 11.7 -Sputum culture still pending and strep pneumo urine antigen was negative -Blood cultures x2 showed no growth to date at less than 12 hours -Repeat chest x-ray showed "Mediastinum and hilar structures normal. Heart size normal. Mild right upper lobe infiltrate. Mild bibasilar atelectasis/infiltrates. No pleural effusion or pneumothorax. No acute bony abnormality." -Further treatment as delineated below -Continue to monitor respiratory status carefully  Acute respiratory failure with hypoxia likely secondary to above -Patient required 2 L supplemental oxygen when I walked in -Continue minus pulse oximetry and maintain O2 saturation greater than 92% -Continue supplemental oxygen via nasal cannula and wean O2 as tolerated -Repeat chest x-ray in the a.m. -We will give 1 dose of diuresis 40 mg of Lasix x1 -Adding breathing treatments with Xopenex/Atrovent every 6 scheduled and PRN albuterol -Start guaifenesin, flutter valve, incentive spirometry -We will need home ambulatory screen prior to discharge  Major Depressive Disorder and Anxiety; History of a suicidal attempt -Currently not presenting with any SI or HI -Continue with bupropion 300 mg p.o. daily, mirtazapine 50 mg p.o. nightly, alprazolam 0.5 mg p.o. 3 times daily as needed, trazodone 50 mg p.o. nightly, venlafaxine Exar 150 mg p.o. daily  Psoriasis  -Stable -Has been off of Humira  for upcoming hernia repair   Hypertension  -BP at goal -Continue with irbesartan 300 mg p.o. daily  Tachyarrhythmia  -Transient  tachycardia to 150 noted on monitor, appears to be narrow complex but also frequent PVC's; -patient denies chest pain or palpitations, and denies hx of heart disease or arrhythmia   -Continue telemetry monitoring and magnesium level was checked and was 2.1 -EKG showed sinus tachycardia at a rate of 108 with a QTC of 456 -Continue to monitor and repeat EKG in a.m.  Morbid Obesity -Estimated body mass index is 48.2 kg/m as calculated from the following:   Height as of this encounter: 5\' 11"  (1.803 m).   Weight as of this encounter: 156.8 kg.  -Weight loss and dietary counseling given  Hyperglycemia -Likely Reactive -Blood Glucose ranging from 74-115 on Daily BMP -Check hemoglobin A1c in the a.m. -If blood sugars remain consistently elevated will place on sensitive NovoLog sliding scale insulin before meals and at bedtime  DVT prophylaxis: Enoxaparin 80 mg sq qHS Code Status: FULL CODE  Family Communication: No family present at bedside  Disposition Plan: Remain inpatient for current treatment and work-up; will need ambulatory home O2 screen prior to discharge  Consultants:   None   Procedures:  None   Antimicrobials:  Anti-infectives (From admission, onward)   Start     Dose/Rate Route Frequency Ordered Stop   09/23/18 2115  cefTRIAXone (ROCEPHIN) 2 g in sodium chloride 0.9 % 100 mL IVPB     2 g 200 mL/hr over 30 Minutes Intravenous Every 24 hours 09/23/18 2103     09/23/18 2115  azithromycin (ZITHROMAX) 500 mg in sodium chloride 0.9 % 250 mL IVPB     500 mg 250 mL/hr over 60 Minutes Intravenous Every 24 hours 09/23/18 2103       Subjective: Seen and examined at bedside and patient with still feeling a little dyspneic and complaining of some shortness of breath.  No nausea or vomiting.  I lightheadedness or dizziness and states that she is been coughing some.  States her legs are little swollen and has been more swollen recently.  No nausea or vomiting. No other concerns  or complaints at this time.   Objective: Vitals:   09/24/18 0304 09/24/18 0534 09/24/18 1414 09/24/18 1430  BP:  115/71 112/76   Pulse:  97 94   Resp:  18 20   Temp:  97.6 F (36.4 C) 98.4 F (36.9 C)   TempSrc:  Oral Oral   SpO2: 98% 96% 94% 94%  Weight:      Height:        Intake/Output Summary (Last 24 hours) at 09/24/2018 1459 Last data filed at 09/24/2018 1413 Gross per 24 hour  Intake 2295.93 ml  Output 2500 ml  Net -204.07 ml   Filed Weights   09/23/18 1943 09/24/18 0035  Weight: (!) 154.2 kg (!) 156.8 kg   Examination: Physical Exam:  Constitutional: WN/WD morbidly obese Caucasian female in NAD and appears calm but slightly uncomfortable Eyes: Lids and conjunctivae normal, sclerae anicteric  ENMT: External Ears, Nose appear normal. Grossly normal hearing. Mucous membranes are moist.  Neck: Appears normal, supple, no cervical masses, normal ROM, no appreciable thyromegaly; no JVD Respiratory: Diminished to auscultation bilaterally but more so on the Right compared to the Left, no wheezing, rales, rhonchi or crackles. Normal respiratory effort and patient is not tachypenic. No accessory muscle use. Wearing 2 Liters of Supplemental O2 via [place] Cardiovascular: RRR but likely on the faster  side, no murmurs / rubs / gallops. S1 and S2 auscultated.  Has 1+ lower extremity edema Abdomen: Soft, non-tender, Distended 2/2 body habits. No masses palpated. No appreciable hepatosplenomegaly. Bowel sounds positive x4.  GU: Deferred. Musculoskeletal: No clubbing / cyanosis of digits/nails. No joint deformity upper and lower extremities. Good ROM, no contractures. Normal strength and muscle tone.  Skin: No rashes, lesions, ulcers on a limited skin examination. No induration; Warm and dry.  Neurologic: CN 2-12 grossly intact with no focal deficits. Romberg sign and cerebellar reflexes not assessed.  Psychiatric: Normal judgment and insight. Alert and oriented x 3. Mildly anxious mood and  appropriate affect.   Data Reviewed: I have personally reviewed following labs and imaging studies  CBC: Recent Labs  Lab 09/23/18 2015 09/24/18 0509  WBC 13.7* 11.7*  NEUTROABS 9.3* 7.1  HGB 13.1 12.4  HCT 40.1 38.9  MCV 103.1* 105.7*  PLT 207 185   Basic Metabolic Panel: Recent Labs  Lab 09/23/18 2015 09/23/18 2321 09/24/18 0509  NA 141  --  140  K 3.8  --  3.7  CL 106  --  106  CO2 25  --  26  GLUCOSE 74  --  115*  BUN 12  --  12  CREATININE 0.74  --  0.83  CALCIUM 8.4*  --  7.9*  MG  --  2.1  --    GFR: Estimated Creatinine Clearance: 136.2 mL/min (by C-G formula based on SCr of 0.83 mg/dL). Liver Function Tests: No results for input(s): AST, ALT, ALKPHOS, BILITOT, PROT, ALBUMIN in the last 168 hours. No results for input(s): LIPASE, AMYLASE in the last 168 hours. No results for input(s): AMMONIA in the last 168 hours. Coagulation Profile: No results for input(s): INR, PROTIME in the last 168 hours. Cardiac Enzymes: No results for input(s): CKTOTAL, CKMB, CKMBINDEX, TROPONINI in the last 168 hours. BNP (last 3 results) No results for input(s): PROBNP in the last 8760 hours. HbA1C: No results for input(s): HGBA1C in the last 72 hours. CBG: No results for input(s): GLUCAP in the last 168 hours. Lipid Profile: No results for input(s): CHOL, HDL, LDLCALC, TRIG, CHOLHDL, LDLDIRECT in the last 72 hours. Thyroid Function Tests: No results for input(s): TSH, T4TOTAL, FREET4, T3FREE, THYROIDAB in the last 72 hours. Anemia Panel: No results for input(s): VITAMINB12, FOLATE, FERRITIN, TIBC, IRON, RETICCTPCT in the last 72 hours. Sepsis Labs: Recent Labs  Lab 09/23/18 2015  LATICACIDVEN 1.1    Recent Results (from the past 240 hour(s))  SARS Coronavirus 2 (CEPHEID- Performed in [facility] hospital lab), Hosp Order     Status: None   Collection Time: 09/23/18  8:15 PM   Specimen: Nasopharyngeal Swab  Result Value Ref Range Status   SARS Coronavirus 2 NEGATIVE  NEGATIVE Final    Comment: (NOTE) If result is NEGATIVE SARS-CoV-2 target nucleic acids are NOT DETECTED. The SARS-CoV-2 RNA is generally detectable in upper and lower  respiratory specimens during the acute phase of infection. The lowest  concentration of SARS-CoV-2 viral copies this assay can detect is 250  copies / mL. A negative result does not preclude SARS-CoV-2 infection  and should not be used as the sole basis for treatment or other  patient management decisions.  A negative result may occur with  improper specimen collection / handling, submission of specimen other  than nasopharyngeal swab, presence of viral mutation(s) within the  areas targeted by this assay, and inadequate number of viral copies  (<250 copies / mL).  A negative result must be combined with clinical  observations, patient history, and epidemiological information. If result is POSITIVE SARS-CoV-2 target nucleic acids are DETECTED. The SARS-CoV-2 RNA is generally detectable in upper and lower  respiratory specimens dur ing the acute phase of infection.  Positive  results are indicative of active infection with SARS-CoV-2.  Clinical  correlation with patient history and other diagnostic information is  necessary to determine patient infection status.  Positive results do  not rule out bacterial infection or co-infection with other viruses. If result is PRESUMPTIVE POSTIVE SARS-CoV-2 nucleic acids MAY BE PRESENT.   A presumptive positive result was obtained on the submitted specimen  and confirmed on repeat testing.  While 2019 novel coronavirus  (SARS-CoV-2) nucleic acids may be present in the submitted sample  additional confirmatory testing may be necessary for epidemiological  and / or clinical management purposes  to differentiate between  SARS-CoV-2 and other Sarbecovirus currently known to infect humans.  If clinically indicated additional testing with an alternate test  methodology [phone]([id number]) is  advised. The SARS-CoV-2 RNA is generally  detectable in upper and lower respiratory sp ecimens during the acute  phase of infection. The expected result is Negative. Fact Sheet for Patients:  BoilerBrush.com.cyhttps://www.fda.gov/media/136312/download Fact Sheet for Healthcare Providers: https://pope.com/https://www.fda.gov/media/136313/download This test is not yet approved or cleared by the [place] FDA and has been authorized for detection and/or diagnosis of SARS-CoV-2 by FDA under an Emergency Use Authorization (EUA).  This EUA will remain in effect (meaning this test can be used) for the duration of the COVID-19 declaration under Section 564(b)(1) of the Act, 21 U.S.C. section 360bbb-3(b)(1), unless the authorization is terminated or revoked sooner. Performed at [facility], [address] [place], [place] [address]   Culture, blood (Routine X 2) w Reflex to ID Panel     Status: None (Preliminary result)   Collection Time: 09/23/18  8:15 PM   Specimen: BLOOD LEFT HAND  Result Value Ref Range Status   Specimen Description   Final    BLOOD LEFT HAND Performed at [facility], [address] [place], [place] [address]    Special Requests   Final    BOTTLES DRAWN AEROBIC AND ANAEROBIC Blood Culture adequate volume Performed at [facility], [address] [place], [place] [address]    Culture   Final    NO GROWTH < 12 HOURS Performed at [facility], [address] [place], [place] [address]    Report Status PENDING  Incomplete  Culture, blood (Routine X 2) w Reflex to ID Panel     Status: None (Preliminary result)   Collection Time: 09/23/18  8:20 PM   Specimen: BLOOD RIGHT HAND  Result Value Ref Range Status   Specimen Description   Final    BLOOD RIGHT HAND Performed at [facility], [address] [place], [place] [address]    Special Requests   Final    BOTTLES DRAWN AEROBIC AND ANAEROBIC Blood Culture results may not be optimal due  to an excessive volume of blood received in culture bottles Performed at [facility], [address] [place], [place] [address]    Culture   Final    NO GROWTH < 12 HOURS Performed at [facility], [address] [place], [place] [address]    Report Status PENDING  Incomplete  Culture, sputum-assessment     Status: None   Collection Time: 09/24/18  6:43 AM   Specimen: Sputum  Result Value Ref Range Status   Specimen Description SPUTUM  Final   Special Requests Immunocompromised  Final   Sputum evaluation   Final    THIS SPECIMEN IS ACCEPTABLE FOR SPUTUM CULTURE Performed at [facility], [place] [address], [place], [place] [address]    Report Status 09/24/2018 FINAL  Final  Culture, respiratory     Status: None (Preliminary result)   Collection Time: 09/24/18  6:43 AM   Specimen: SPU  Result Value Ref Range Status   Specimen Description   Final    SPUTUM Performed at [place] [address] [place], [place] [address]    Special Requests   Final    Immunocompromised Reflexed from [phone] Performed at [place] [address] [place], [place] [address]    Gram Stain   Final    NO WBC SEEN RARE SQUAMOUS EPITHELIAL CELLS PRESENT MODERATE GRAM POSITIVE COCCI IN PAIRS Performed at [facility], [place] [address], [place], [place] [address]    Culture PENDING  Incomplete   Report Status PENDING  Incomplete    Radiology Studies: Dg Chest Port 1 View  Result Date: 09/24/2018 CLINICAL DATA:  Shortness of breath. EXAM: PORTABLE CHEST 1 VIEW COMPARISON:  09/23/2018. FINDINGS: Mediastinum and hilar structures normal. Heart size normal. Mild right upper lobe infiltrate. Mild bibasilar atelectasis/infiltrates. No pleural effusion or pneumothorax. No acute bony abnormality. IMPRESSION: Mild right upper lobe infiltrate. Mild bibasilar atelectasis/infiltrates. Electronically Signed   By: Marcello Moores   Register   On: 09/24/2018 10:47   Dg Chest Port 1 View  Result Date: 09/23/2018 CLINICAL DATA:  Cough and congestion EXAM: PORTABLE CHEST 1 VIEW COMPARISON:  01/21/2018 FINDINGS: Ill-defined airspace disease in the right upper lobe and right lower lobe. Ill-defined left lower lobe airspace disease, unchanged from prior studies. No effusion. Heart size normal. IMPRESSION: Right upper lobe and right lower lobe airspace disease likely due to pneumonia. Left lower lobe scarring. Electronically Signed   By: Franchot Gallo M.D.   On: 09/23/2018 20:47   Scheduled Meds: . buPROPion  300 mg Oral Daily  . enoxaparin (LOVENOX) injection  80 mg Subcutaneous QHS  . guaiFENesin  1,200 mg Oral BID  . ipratropium  0.5 mg Nebulization TID  . irbesartan  300 mg Oral Daily  . levalbuterol  0.63 mg Nebulization TID  . mirtazapine  15 mg Oral QHS  . montelukast  10 mg Oral QHS  . pantoprazole  40 mg Oral BID  . sodium chloride flush  3 mL Intravenous Q12H  . traZODone  50 mg Oral QHS  . venlafaxine XR  150 mg Oral Q breakfast   Continuous Infusions: . sodium chloride    . azithromycin 500 mg (09/23/18 2135)  . cefTRIAXone (ROCEPHIN)  IV 2 g (09/23/18 2132)    LOS: 0 days   Kerney Elbe, DO [facility] PAGER is on [place]  If 7PM-7AM, please contact night-coverage [email or web address] Password [facility] 09/24/2018, 2:59 PM

## 2018-09-24 NOTE — ED Notes (Signed)
Report given to Maria, RN.

## 2018-09-24 NOTE — Progress Notes (Signed)
Pharmacy LMWH dose adjustment  LMWH 40 mg>> LMWH 80 mg q24 for BMI 47 for VTE px  Eudelia Bunch, Pharm.D 09/24/2018 12:35 AM

## 2018-09-25 ENCOUNTER — Inpatient Hospital Stay (HOSPITAL_COMMUNITY): Payer: Medicare HMO

## 2018-09-25 LAB — CBC WITH DIFFERENTIAL/PLATELET
Abs Immature Granulocytes: 0.04 10*3/uL (ref 0.00–0.07)
Basophils Absolute: 0 10*3/uL (ref 0.0–0.1)
Basophils Relative: 0 %
Eosinophils Absolute: 0.5 10*3/uL (ref 0.0–0.5)
Eosinophils Relative: 5 %
HCT: 39.8 % (ref 36.0–46.0)
Hemoglobin: 12.7 g/dL (ref 12.0–15.0)
Immature Granulocytes: 0 %
Lymphocytes Relative: 33 %
Lymphs Abs: 3.1 10*3/uL (ref 0.7–4.0)
MCH: 33.4 pg (ref 26.0–34.0)
MCHC: 31.9 g/dL (ref 30.0–36.0)
MCV: 104.7 fL — ABNORMAL HIGH (ref 80.0–100.0)
Monocytes Absolute: 0.5 10*3/uL (ref 0.1–1.0)
Monocytes Relative: 5 %
Neutro Abs: 5.1 10*3/uL (ref 1.7–7.7)
Neutrophils Relative %: 57 %
Platelets: 220 10*3/uL (ref 150–400)
RBC: 3.8 MIL/uL — ABNORMAL LOW (ref 3.87–5.11)
RDW: 13.1 % (ref 11.5–15.5)
WBC: 9.2 10*3/uL (ref 4.0–10.5)
nRBC: 0 % (ref 0.0–0.2)

## 2018-09-25 LAB — COMPREHENSIVE METABOLIC PANEL
ALT: 20 U/L (ref 0–44)
AST: 19 U/L (ref 15–41)
Albumin: 3.3 g/dL — ABNORMAL LOW (ref 3.5–5.0)
Alkaline Phosphatase: 61 U/L (ref 38–126)
Anion gap: 9 (ref 5–15)
BUN: 15 mg/dL (ref 6–20)
CO2: 28 mmol/L (ref 22–32)
Calcium: 8.4 mg/dL — ABNORMAL LOW (ref 8.9–10.3)
Chloride: 103 mmol/L (ref 98–111)
Creatinine, Ser: 0.91 mg/dL (ref 0.44–1.00)
GFR calc Af Amer: >60 mL/min (ref >60)
GFR calc non Af Amer: >60 mL/min (ref >60)
Glucose, Bld: 126 mg/dL — ABNORMAL HIGH (ref 70–99)
Potassium: 4.5 mmol/L (ref 3.5–5.1)
Sodium: 140 mmol/L (ref 135–145)
Total Bilirubin: 0.2 mg/dL — ABNORMAL LOW (ref 0.3–1.2)
Total Protein: 6.8 g/dL (ref 6.5–8.1)

## 2018-09-25 LAB — PHOSPHORUS: Phosphorus: 3.6 mg/dL (ref 2.5–4.6)

## 2018-09-25 LAB — MAGNESIUM: Magnesium: 2.2 mg/dL (ref 1.7–2.4)

## 2018-09-25 LAB — HIV ANTIBODY (ROUTINE TESTING W REFLEX): HIV Screen 4th Generation wRfx: NONREACTIVE

## 2018-09-25 MED ORDER — IPRATROPIUM-ALBUTEROL 0.5-2.5 (3) MG/3ML IN SOLN: 3.0000 mL | Freq: Four times a day (QID) | RESPIRATORY_TRACT | 0 refills | Status: DC | PRN

## 2018-09-25 MED ORDER — FUROSEMIDE 10 MG/ML IJ SOLN
40.0000 mg | Freq: Once | INTRAMUSCULAR | Status: AC
  Administered 2018-09-25: 12:00:00 40 mg via INTRAVENOUS
  Filled 2018-09-25: qty 4

## 2018-09-25 MED ORDER — POLYETHYLENE GLYCOL 3350 17 G PO PACK: 17.0000 g | PACK | Freq: Every day | ORAL | 0 refills | Status: DC | PRN

## 2018-09-25 MED ORDER — AZITHROMYCIN 500 MG PO TABS: 500.0000 mg | ORAL_TABLET | Freq: Every day | ORAL | 0 refills | Status: AC

## 2018-09-25 MED ORDER — IPRATROPIUM-ALBUTEROL 0.5-2.5 (3) MG/3ML IN SOLN: 3.0000 mL | Freq: Four times a day (QID) | RESPIRATORY_TRACT | Status: DC | PRN

## 2018-09-25 MED ORDER — AZITHROMYCIN 250 MG PO TABS
500.0000 mg | ORAL_TABLET | Freq: Every day | ORAL | Status: DC
  Administered 2018-09-25: 13:00:00 500 mg via ORAL
  Filled 2018-09-25: qty 2

## 2018-09-25 MED ORDER — CEFDINIR 300 MG PO CAPS: 300.0000 mg | ORAL_CAPSULE | Freq: Two times a day (BID) | ORAL | Status: DC

## 2018-09-25 MED ORDER — CEFDINIR 300 MG PO CAPS: 300.0000 mg | ORAL_CAPSULE | Freq: Two times a day (BID) | ORAL | 0 refills | Status: AC

## 2018-09-25 MED ORDER — GUAIFENESIN ER 600 MG PO TB12: 1200.0000 mg | ORAL_TABLET | Freq: Two times a day (BID) | ORAL | 0 refills | Status: AC

## 2018-09-25 NOTE — Plan of Care (Signed)
Discharge instructions reviewed with patient, questions answered, verbalized understanding.  Patient and belongings transported to main entrance via wheelchair to be taken home by mother.

## 2018-09-25 NOTE — Progress Notes (Signed)
PHARMACIST - PHYSICIAN COMMUNICATION DR:   Alfredia Ferguson and colleagues CONCERNING: Antibiotic IV to Oral Route Change Policy  RECOMMENDATION: This patient is receiving Azithromycin by the intravenous route.  Based on criteria approved by the Pharmacy and Therapeutics Committee, the antibiotic(s) is/are being converted to the equivalent oral dose form(s).   DESCRIPTION: These criteria include:  Patient being treated for a respiratory tract infection, urinary tract infection, cellulitis or clostridium difficile associated diarrhea if on metronidazole  The patient is not neutropenic and does not exhibit a GI malabsorption state  The patient is eating (either orally or via tube) and/or has been taking other orally administered medications for a least 24 hours  The patient is improving clinically and has a Tmax < 100.5  If you have questions about this conversion, please contact the Pharmacy Department  []   [phone] )  [facility] []   [phone] )  [facility] []   [phone] )  [facility] []   [phone] )  [facility] [x]   [phone] )  [place], PharmD, BCPS Clinical pharmacist 09/25/2018 9:55 AM

## 2018-09-25 NOTE — Discharge Summary (Signed)
Physician Discharge Summary  [name] ZOX:[id number] DOB: [birth year] DOA: 09/23/2018  PCP: Colleen Inch, MD  Admit date: 09/23/2018 Discharge date: 09/25/2018  Admitted From: Home Disposition: Home  Recommendations for Outpatient Follow-up:  1. Follow up with PCP in 1-2 weeks; Appointment scheduled for this Wednesday  2. Repeat CXR in 3-6 weeks 3. Have PCP evaluate need for Outpatient ECHO 4. Please obtain CMP/CBC, Mag, Phos in one week 5. Please follow up on the following pending results: Sputum Cx  Home Health: No Equipment/Devices: None    Discharge Condition: Stable CODE STATUS: FULL CODE Diet recommendation: Heart Healthy Diet   Brief/Interim Summary: HPI per Dr. Marcial Pacas Walter on 09/23/2018 Colleen Berkshire Butleris a 48 y.o.femalewith medical history significant fordepression, anxiety, hypertension, and psoriasis, now presenting to the emergency department for evaluation of fever, cough, and fatigue.Patient reports that she began to develop some nonspecific malaise and fatigue a couple days ago, and then woke this morning early with shortness of breath, productive cough, and right-sided pleuritic pain. She reports intermittent fevers at home. She denies any new leg swelling or tenderness, denies sick contacts or travel, and does not know of anyone with the novel coronavirus. She has been off of her Humira for upcoming hernia repair that has been postponed due to the current COVID-19 pandemic.  ED Course:Upon arrival to the ED, patient is found to be afebrile, saturating low 90s on room air while at rest, tachycardic to 110, and with stable blood pressure. Chest x-ray is concerning for pneumonia involving the right upper and right lower lobes. Chemistry panel is unremarkable and CBC notable for leukocytosis to 13,700 and macrocytosis without anemia. Lactic acid is reassuringly normal and COVID-19 is negative. Blood cultures were collected and the patient was started on  Rocephin and azithromycin. She remains dyspneic at rest, has difficulty with minimal exertion, and will be observed for ongoing evaluation and management.  **Interim History  She was still complaining of shortness of breath.  Medications have been adjusted and she was been given a diuretic given some lower leg swelling.  No nausea or vomiting.  She improved with antibiotics as well as mild diuresis and respiratory status stable.  She is weaned off of oxygen and continue to cough up some sputum but states that for about helped her chest.  She ambulated without desaturating and antibiotics were transitioned to p.o. she was deemed medically stable for discharge at this time will need to follow-up with PCP and repeat chest x-ray in 3-6 weeks  Discharge Diagnoses:  Principal Problem:   Pneumonia Active Problems:   Depression with anxiety   Psoriasis  Community-Acquired Pneumonia -Presented with fevers, fatigue, productive cough, SOB, and right-sided pleuritic pain -She is found to be tachycardic and dyspneic with leukocytosis and CXR findings concerning for pneumonia involving RUL and RLL -COVID-19 is negative and the clinical picture more consistent with bacterial PNA -Blood cultures were collected in ED and she was started on empiric Rocephin and Azithromycinand will continue these today and transition to po Cefdinir and Azithromycin at D/C  -WBC is trending downward and went from 13.7 is now 11.7 -> 9.2 -Sputum culture still but did show Moderate Gram Positive Cocci in Pairs and strep pneumo urine antigen was negative -Blood cultures x2 showed no growth to date at less than 12 hours -Repeat chest x-ray showed "Mediastinum and hilar structures normal. Heart size normal. Mild right upper lobe infiltrate. Mild bibasilar atelectasis/infiltrates. No pleural effusion or pneumothorax. No acute bony abnormality." -Further treatment  as delineated below and patient is improved  -Continue to  monitor respiratory status carefully and it has improved  -Repeat CXR in 3-6 weeks; Patient has PCP Appointment on Wednesday  Acute respiratory failure with hypoxia likely secondary to above -Patient required 2 L supplemental oxygen when I walked in -Continue pulse oximetry and maintain O2 saturation greater than 92% -Continue supplemental oxygen via nasal cannula and wean O2 as tolerated, and now off of O2 -Repeat chest x-ray this AM showed "Stable hazy upper right parahilar lung opacity, atypical pneumonia not excluded. Stable mild bibasilar atelectasis." -We will give 1 dose of diuresis 40 mg of Lasix x1 yesterday and 1 today  -Adding breathing treatments with Xopenex/Atrovent every 6 scheduled and PRN albuterol and changed to TID  -Start guaifenesin, flutter valve, incentive spirometry and this has improved  -Ambulatory screen prior to discharge and patient did not desaturate  -Repeat CXR in 3-6 weeks  Major Depressive Disorder and Anxiety; History of a suicidal attempt -Currently not presenting with any SI or HI -Continue with bupropion 300 mg p.o. daily, mirtazapine 50 mg p.o. nightly, alprazolam 0.5 mg p.o. 3 times daily as needed, trazodone 50 mg p.o. nightly, venlafaxine Exar 150 mg p.o. daily  Psoriasis -Stable -Has been off of Humira for upcoming hernia repair  Hypertension  -BP at goal -Continue with Irbesartan 300 mg p.o. daily  Tachyarrhythmia, improved  -Transient tachycardia to 150notedon monitor, appears to be narrow complex but also frequent PVC's; -patient denies chest pain or palpitations, and denies hx of heart disease or arrhythmia  -Continue telemetry monitoring and magnesium level was checked and was 2.1 -EKG on admission showedsinus tachycardia at a rate of 108 with a QTC of 456 -Continue to monitor and repeat EKG as an outpatient   Morbid Obesity -Estimated body mass index is 48.2 kg/m as calculated from the following:   Height as of this  encounter: 5\' 11"  (1.803 m).   Weight as of this encounter: 156.8 kg.  -Weight loss and dietary counseling given  Hyperglycemia -Likely Reactive -Blood Glucose ranging from 74-115 on Daily BMP -Check hemoglobin A1c as an outpatient . -If blood sugars remain consistently elevated will place on sensitive NovoLog sliding scale insulin before meals and at bedtime  Discharge Instructions  Discharge Instructions    Call MD for:  difficulty breathing, headache or visual disturbances   Complete by: As directed    Call MD for:  extreme fatigue   Complete by: As directed    Call MD for:  hives   Complete by: As directed    Call MD for:  persistant dizziness or light-headedness   Complete by: As directed    Call MD for:  persistant nausea and vomiting   Complete by: As directed    Call MD for:  redness, tenderness, or signs of infection (pain, swelling, redness, odor or green/yellow discharge around incision site)   Complete by: As directed    Call MD for:  severe uncontrolled pain   Complete by: As directed    Call MD for:  temperature >100.4   Complete by: As directed    Diet - low sodium heart healthy   Complete by: As directed    Discharge instructions   Complete by: As directed    You were cared for by a hospitalist during your hospital stay. If you have any questions about your discharge medications or the care you received while you were in the hospital after you are discharged, you can call the  unit and ask to speak with the hospitalist on call if the hospitalist that took care of you is not available. Once you are discharged, your primary care physician will handle any further medical issues. Please note that NO REFILLS for any discharge medications will be authorized once you are discharged, as it is imperative that you return to your primary care physician (or establish a relationship with a primary care physician if you do not have one) for your aftercare needs so that they can  reassess your need for medications and monitor your lab values.  Follow up with PCP and repeat CXR in 3-6 weeks and have ECHOCardiogram Done as an outpatient if deemed necessary by PCP. Take all medications as prescribed. If symptoms change or worsen please return to the ED for evaluation   Increase activity slowly   Complete by: As directed      Allergies as of 09/25/2018      Reactions   Influenza Vaccines    States her "arm had a knot and turned red"   Lisinopril-hydrochlorothiazide    welts      Medication List    STOP taking these medications   diclofenac 75 MG EC tablet Commonly known as: VOLTAREN     TAKE these medications   acetaminophen 500 MG tablet Commonly known as: TYLENOL Take 1,000 mg by mouth every 4 (four) hours as needed for fever or headache.   albuterol 108 (90 Base) MCG/ACT inhaler Commonly known as: VENTOLIN HFA Inhale 2 puffs into the lungs every 6 (six) hours as needed. For shortness of breath   ALPRAZolam 0.5 MG tablet Commonly known as: XANAX Take 0.5 mg by mouth 3 (three) times daily as needed for anxiety.   azelastine 0.1 % nasal spray Commonly known as: ASTELIN Place 2 sprays into both nostrils 2 (two) times daily as needed for rhinitis. Use in each nostril as directed   azithromycin 500 MG tablet Commonly known as: ZITHROMAX Take 1 tablet (500 mg total) by mouth daily for 4 days. Notes to patient: 09/26/2018    b complex vitamins tablet Take 1 tablet by mouth daily. Notes to patient: 09/26/2018    Biotin 2841310000 MCG Tabs Take 10,000 mcg by mouth daily. Notes to patient: 09/26/2018   buPROPion 300 MG 24 hr tablet Commonly known as: WELLBUTRIN XL Take 300 mg by mouth daily. Notes to patient: 09/26/2018    cefdinir 300 MG capsule Commonly known as: OMNICEF Take 1 capsule (300 mg total) by mouth every 12 (twelve) hours for 4 days. Start taking on: September 26, 2018 Notes to patient: 09/26/2018    cetirizine 10 MG tablet Commonly known as:  ZYRTEC Take 10 mg by mouth daily as needed for allergies.   guaiFENesin 600 MG 12 hr tablet Commonly known as: MUCINEX Take 2 tablets (1,200 mg total) by mouth 2 (two) times daily for 5 days. Notes to patient: 09/25/2018 bedtime    HYDROcodone-acetaminophen 5-325 MG tablet Commonly known as: Norco Take 1 tablet by mouth every 6 (six) hours as needed for moderate pain.   ipratropium-albuterol 0.5-2.5 (3) MG/3ML Soln Commonly known as: DUONEB Take 3 mLs by nebulization every 6 (six) hours as needed.   mirtazapine 15 MG tablet Commonly known as: REMERON Take 1 tablet (15 mg total) by mouth at bedtime. For mood control   multivitamin tablet Take 1 tablet by mouth daily. Notes to patient: 09/25/2018    olmesartan 40 MG tablet Commonly known as: BENICAR Take 40 mg by mouth every  morning. Notes to patient: 09/26/2018    polyethylene glycol 17 g packet Commonly known as: MIRALAX / GLYCOLAX Take 17 g by mouth daily as needed for mild constipation.   promethazine 25 MG tablet Commonly known as: PHENERGAN Take 25 mg by mouth every 8 (eight) hours as needed for nausea or vomiting.   Protonix 40 MG tablet Generic drug: pantoprazole Take 40 mg by mouth 2 (two) times daily. Notes to patient: 09/25/2018 evening    Singulair 10 MG tablet Generic drug: montelukast Take 10 mg by mouth at bedtime.   traZODone 50 MG tablet Commonly known as: DESYREL Take 1 tablet (50 mg total) by mouth at bedtime as needed for sleep. For sleep What changed: when to take this   venlafaxine XR 150 MG 24 hr capsule Commonly known as: EFFEXOR-XR Take 1 capsule by mouth daily with breakfast. Notes to patient: 09/26/2018    vitamin A 8000 UNIT capsule Take 8,000 Units by mouth daily. Notes to patient: 09/25/2018    vitamin B-12 1000 MCG tablet Commonly known as: CYANOCOBALAMIN Take 1,000 mcg by mouth daily. Notes to patient: 09/25/2018       Follow-up Information    Colleen Inch, MD. Call.    Specialty: Family Medicine Why: Follow up with PCP within 1 week  Contact information: [address] [place] [address] [phone]          Allergies  Allergen Reactions  . Influenza Vaccines     States her "arm had a knot and turned red"  . Lisinopril-Hydrochlorothiazide     welts   Consultations:  None  Procedures/Studies: Dg Chest Port 1 View  Result Date: 09/25/2018 CLINICAL DATA:  Dyspnea EXAM: PORTABLE CHEST 1 VIEW COMPARISON:  Chest radiograph from one day prior. FINDINGS: Stable cardiomediastinal silhouette with normal heart size. No pneumothorax. No pleural effusion. Hazy upper right parahilar lung opacity is stable. Stable mild bibasilar atelectasis. IMPRESSION: Stable hazy upper right parahilar lung opacity, atypical pneumonia not excluded. Stable mild bibasilar atelectasis. Electronically Signed   By: Delbert Phenix M.D.   On: 09/25/2018 05:16   Dg Chest Port 1 View  Result Date: 09/24/2018 CLINICAL DATA:  Shortness of breath. EXAM: PORTABLE CHEST 1 VIEW COMPARISON:  09/23/2018. FINDINGS: Mediastinum and hilar structures normal. Heart size normal. Mild right upper lobe infiltrate. Mild bibasilar atelectasis/infiltrates. No pleural effusion or pneumothorax. No acute bony abnormality. IMPRESSION: Mild right upper lobe infiltrate. Mild bibasilar atelectasis/infiltrates. Electronically Signed   By: Maisie Fus  Register   On: 09/24/2018 10:47   Dg Chest Port 1 View  Result Date: 09/23/2018 CLINICAL DATA:  Cough and congestion EXAM: PORTABLE CHEST 1 VIEW COMPARISON:  01/21/2018 FINDINGS: Ill-defined airspace disease in the right upper lobe and right lower lobe. Ill-defined left lower lobe airspace disease, unchanged from prior studies. No effusion. Heart size normal. IMPRESSION: Right upper lobe and right lower lobe airspace disease likely due to pneumonia. Left lower lobe scarring. Electronically Signed   By: Marlan Palau M.D.   On: 09/23/2018 20:47      Subjective: Examined at bedside and respiratory status is improved and states that she no longer feels as short of breath.  Did not require oxygen on home O2 screen.  Still coughing up some sputum is brownish colored and rust colored.  No nausea or vomiting.  No other concerns or complaints at this time.  Stable for discharge and will follow with PCP on Wednesday.  Discharge Exam: Vitals:   09/25/18 0740 09/25/18 0843  BP:  Pulse:    Resp:    Temp:    SpO2: 96% 95%   Vitals:   09/24/18 2154 09/25/18 0518 09/25/18 0740 09/25/18 0843  BP: 130/74 108/73    Pulse: (!) 106 99    Resp: 18 18    Temp: 98 F (36.7 C) 98 F (36.7 C)    TempSrc: Oral Oral    SpO2: 95% 93% 96% 95%  Weight:      Height:       General: Pt is alert, awake, not in acute distress Cardiovascular: RRR, S1/S2 +, no rubs, no gallops Respiratory: Diminished bilaterally but worse on the right compared to the left, no wheezing, no rhonchi; unlabored breathing Abdominal: Soft, NT, distended secondary body habitus, bowel sounds + Extremities: Trace to 1+ lower extremity edema edema, no cyanosis  The results of significant diagnostics from this hospitalization (including imaging, microbiology, ancillary and laboratory) are listed below for reference.    Microbiology: Recent Results (from the past 240 hour(s))  SARS Coronavirus 2 (CEPHEID- Performed in [facility] hospital lab), Hosp Order     Status: None   Collection Time: 09/23/18  8:15 PM   Specimen: Nasopharyngeal Swab  Result Value Ref Range Status   SARS Coronavirus 2 NEGATIVE NEGATIVE Final    Comment: (NOTE) If result is NEGATIVE SARS-CoV-2 target nucleic acids are NOT DETECTED. The SARS-CoV-2 RNA is generally detectable in upper and lower  respiratory specimens during the acute phase of infection. The lowest  concentration of SARS-CoV-2 viral copies this assay can detect is 250  copies / mL. A negative result does not preclude SARS-CoV-2  infection  and should not be used as the sole basis for treatment or other  patient management decisions.  A negative result may occur with  improper specimen collection / handling, submission of specimen other  than nasopharyngeal swab, presence of viral mutation(s) within the  areas targeted by this assay, and inadequate number of viral copies  (<250 copies / mL). A negative result must be combined with clinical  observations, patient history, and epidemiological information. If result is POSITIVE SARS-CoV-2 target nucleic acids are DETECTED. The SARS-CoV-2 RNA is generally detectable in upper and lower  respiratory specimens dur ing the acute phase of infection.  Positive  results are indicative of active infection with SARS-CoV-2.  Clinical  correlation with patient history and other diagnostic information is  necessary to determine patient infection status.  Positive results do  not rule out bacterial infection or co-infection with other viruses. If result is PRESUMPTIVE POSTIVE SARS-CoV-2 nucleic acids MAY BE PRESENT.   A presumptive positive result was obtained on the submitted specimen  and confirmed on repeat testing.  While 2019 novel coronavirus  (SARS-CoV-2) nucleic acids may be present in the submitted sample  additional confirmatory testing may be necessary for epidemiological  and / or clinical management purposes  to differentiate between  SARS-CoV-2 and other Sarbecovirus currently known to infect humans.  If clinically indicated additional testing with an alternate test  methodology [phone]) is advised. The SARS-CoV-2 RNA is generally  detectable in upper and lower respiratory sp ecimens during the acute  phase of infection. The expected result is Negative. Fact Sheet for Patients:  BoilerBrush.com.cy Fact Sheet for Healthcare Providers: https://pope.com/ This test is not yet approved or cleared by the [place]  FDA and has been authorized for detection and/or diagnosis of SARS-CoV-2 by FDA under an Emergency Use Authorization (EUA).  This EUA will remain in effect (meaning this  test can be used) for the duration of the COVID-19 declaration under Section 564(b)(1) of the Act, 21 U.S.C. section 360bbb-3(b)(1), unless the authorization is terminated or revoked sooner. Performed at [facility], [address] [place], [place] [address]   Culture, blood (Routine X 2) w Reflex to ID Panel     Status: None (Preliminary result)   Collection Time: 09/23/18  8:15 PM   Specimen: BLOOD LEFT HAND  Result Value Ref Range Status   Specimen Description   Final    BLOOD LEFT HAND Performed at [facility], [address] [place], [place] [address]    Special Requests   Final    BOTTLES DRAWN AEROBIC AND ANAEROBIC Blood Culture adequate volume Performed at [facility], [address] [place], [place] [address]    Culture   Final    NO GROWTH 2 DAYS Performed at [facility], [address], [place], [place] [address]    Report Status PENDING  Incomplete  Culture, blood (Routine X 2) w Reflex to ID Panel     Status: None (Preliminary result)   Collection Time: 09/23/18  8:20 PM   Specimen: BLOOD RIGHT HAND  Result Value Ref Range Status   Specimen Description   Final    BLOOD RIGHT HAND Performed at [facility], [address], [place], [place] [address]    Special Requests   Final    BOTTLES DRAWN AEROBIC AND ANAEROBIC Blood Culture results may not be optimal due to an excessive volume of blood received in culture bottles Performed at [facility], [address], [place], [place] [address]    Culture   Final    NO GROWTH 2 DAYS Performed at [facility], [address] [place], [place] [address]    Report Status PENDING  Incomplete  Culture, sputum-assessment     Status: None   Collection Time: 09/24/18  6:43  AM   Specimen: Sputum  Result Value Ref Range Status   Specimen Description SPUTUM  Final   Special Requests Immunocompromised  Final   Sputum evaluation   Final    THIS SPECIMEN IS ACCEPTABLE FOR SPUTUM CULTURE Performed at [facility], [address], [place], [place] [address]    Report Status 09/24/2018 FINAL  Final  Culture, respiratory     Status: None (Preliminary result)   Collection Time: 09/24/18  6:43 AM   Specimen: SPU  Result Value Ref Range Status   Specimen Description   Final    SPUTUM Performed at [facility], [address], [place], [place] [address]    Special Requests   Final    Immunocompromised Reflexed from [phone] Performed at [facility], [address], [place], [place] [address]    Gram Stain   Final    NO WBC SEEN RARE SQUAMOUS EPITHELIAL CELLS PRESENT MODERATE GRAM POSITIVE COCCI IN PAIRS    Culture   Final    CULTURE REINCUBATED FOR BETTER GROWTH Performed at [facility], [address], [place], [place] [address]    Report Status PENDING  Incomplete   Labs: BNP (last 3 results) No results for input(s): BNP in the last 8760 hours. Basic Metabolic Panel: Recent Labs  Lab 09/23/18 2015 09/23/18 2321 09/24/18 0509 09/25/18 0541  NA 141  --  140 140  K 3.8  --  3.7 4.5  CL 106  --  106 103  CO2 25  --  26 28  GLUCOSE 74  --  115* 126*  BUN 12  --  12 15  CREATININE 0.74  --  0.83 0.91  CALCIUM 8.4*  --  7.9* 8.4*  MG  --  2.1  --  2.2  PHOS  --   --   --  3.6   Liver Function Tests: Recent Labs  Lab 09/25/18 0541  AST 19  ALT 20  ALKPHOS 61  BILITOT 0.2*  PROT 6.8  ALBUMIN 3.3*   No results for input(s): LIPASE, AMYLASE in the last 168 hours. No results for input(s): AMMONIA in the last 168 hours. CBC: Recent Labs  Lab 09/23/18 2015 09/24/18 0509 09/25/18 0541  WBC 13.7* 11.7* 9.2  NEUTROABS 9.3* 7.1 5.1  HGB 13.1 12.4 12.7  HCT 40.1 38.9 39.8  MCV  103.1* 105.7* 104.7*  PLT 207 185 220   Cardiac Enzymes: No results for input(s): CKTOTAL, CKMB, CKMBINDEX, TROPONINI in the last 168 hours. BNP: Invalid input(s): POCBNP CBG: No results for input(s): GLUCAP in the last 168 hours. D-Dimer No results for input(s): DDIMER in the last 72 hours. Hgb A1c No results for input(s): HGBA1C in the last 72 hours. Lipid Profile No results for input(s): CHOL, HDL, LDLCALC, TRIG, CHOLHDL, LDLDIRECT in the last 72 hours. Thyroid function studies No results for input(s): TSH, T4TOTAL, T3FREE, THYROIDAB in the last 72 hours.  Invalid input(s): FREET3 Anemia work up No results for input(s): VITAMINB12, FOLATE, FERRITIN, TIBC, IRON, RETICCTPCT in the last 72 hours. Urinalysis    Component Value Date/Time   COLORURINE YELLOW 03/23/2017 1344   APPEARANCEUR HAZY (A) 03/23/2017 1344   LABSPEC 1.006 03/23/2017 1344   PHURINE 5.0 03/23/2017 1344   GLUCOSEU NEGATIVE 03/23/2017 1344   HGBUR NEGATIVE 03/23/2017 1344   BILIRUBINUR NEGATIVE 03/23/2017 1344   KETONESUR NEGATIVE 03/23/2017 1344   PROTEINUR NEGATIVE 03/23/2017 1344   UROBILINOGEN 0.2 02/01/2011 2209   NITRITE NEGATIVE 03/23/2017 1344   LEUKOCYTESUR NEGATIVE 03/23/2017 1344   Sepsis Labs Invalid input(s): PROCALCITONIN,  WBC,  LACTICIDVEN Microbiology Recent Results (from the past 240 hour(s))  SARS Coronavirus 2 (CEPHEID- Performed in [facility] hospital lab), Hosp Order     Status: None   Collection Time: 09/23/18  8:15 PM   Specimen: Nasopharyngeal Swab  Result Value Ref Range Status   SARS Coronavirus 2 NEGATIVE NEGATIVE Final    Comment: (NOTE) If result is NEGATIVE SARS-CoV-2 target nucleic acids are NOT DETECTED. The SARS-CoV-2 RNA is generally detectable in upper and lower  respiratory specimens during the acute phase of infection. The lowest  concentration of SARS-CoV-2 viral copies this assay can detect is 250  copies / mL. A negative result does not preclude SARS-CoV-2  infection  and should not be used as the sole basis for treatment or other  patient management decisions.  A negative result may occur with  improper specimen collection / handling, submission of specimen other  than nasopharyngeal swab, presence of viral mutation(s) within the  areas targeted by this assay, and inadequate number of viral copies  (<250 copies / mL). A negative result must be combined with clinical  observations, patient history, and epidemiological information. If result is POSITIVE SARS-CoV-2 target nucleic acids are DETECTED. The SARS-CoV-2 RNA is generally detectable in upper and lower  respiratory specimens dur ing the acute phase of infection.  Positive  results are indicative of active infection with SARS-CoV-2.  Clinical  correlation with patient history  and other diagnostic information is  necessary to determine patient infection status.  Positive results do  not rule out bacterial infection or co-infection with other viruses. If result is PRESUMPTIVE POSTIVE SARS-CoV-2 nucleic acids MAY BE PRESENT.   A presumptive positive result was obtained on the submitted specimen  and confirmed on repeat testing.  While 2019 novel coronavirus  (SARS-CoV-2) nucleic acids may be present in the submitted sample  additional confirmatory testing may be necessary for epidemiological  and / or clinical management purposes  to differentiate between  SARS-CoV-2 and other Sarbecovirus currently known to infect humans.  If clinically indicated additional testing with an alternate test  methodology [phone]) is advised. The SARS-CoV-2 RNA is generally  detectable in upper and lower respiratory sp ecimens during the acute  phase of infection. The expected result is Negative. Fact Sheet for Patients:  StrictlyIdeas.no Fact Sheet for Healthcare Providers: BankingDealers.co.za This test is not yet approved or cleared by the [place]  FDA and has been authorized for detection and/or diagnosis of SARS-CoV-2 by FDA under an Emergency Use Authorization (EUA).  This EUA will remain in effect (meaning this test can be used) for the duration of the COVID-19 declaration under Section 564(b)(1) of the Act, 21 U.S.C. section 360bbb-3(b)(1), unless the authorization is terminated or revoked sooner. Performed at [facility], [place] [address], [place], [place] [address]   Culture, blood (Routine X 2) w Reflex to ID Panel     Status: None (Preliminary result)   Collection Time: 09/23/18  8:15 PM   Specimen: BLOOD LEFT HAND  Result Value Ref Range Status   Specimen Description   Final    BLOOD LEFT HAND Performed at [facility], [place] [address] [place], [place] [address]    Special Requests   Final    BOTTLES DRAWN AEROBIC AND ANAEROBIC Blood Culture adequate volume Performed at [facility] [address], [place], [place] [address]    Culture   Final    NO GROWTH 2 DAYS Performed at [facility] [address] [place], [place] [address]    Report Status PENDING  Incomplete  Culture, blood (Routine X 2) w Reflex to ID Panel     Status: None (Preliminary result)   Collection Time: 09/23/18  8:20 PM   Specimen: BLOOD RIGHT HAND  Result Value Ref Range Status   Specimen Description   Final    BLOOD RIGHT HAND Performed at [facility], [place] [address], [place], [place] [address]    Special Requests   Final    BOTTLES DRAWN AEROBIC AND ANAEROBIC Blood Culture results may not be optimal due to an excessive volume of blood received in culture bottles Performed at [facility] [address] [place], [place] [address]    Culture   Final    NO GROWTH 2 DAYS Performed at [facility] [address] [place], [place] [address]    Report Status PENDING  Incomplete  Culture, sputum-assessment     Status: None   Collection Time: 09/24/18  6:43  AM   Specimen: Sputum  Result Value Ref Range Status   Specimen Description SPUTUM  Final   Special Requests Immunocompromised  Final   Sputum evaluation   Final    THIS SPECIMEN IS ACCEPTABLE FOR SPUTUM CULTURE Performed at [facility], [place] [address], [place], [place] [address]    Report Status 09/24/2018 FINAL  Final  Culture, respiratory     Status: None (Preliminary result)   Collection Time: 09/24/18  6:43 AM   Specimen: SPU  Result Value Ref Range Status   Specimen Description   Final    SPUTUM Performed at [facility], [address] [place], [place] [address]    Special Requests   Final    Immunocompromised Reflexed from [id number] Performed at [facility], [address] [place], [place] [address]    Gram Stain   Final    NO WBC SEEN RARE SQUAMOUS EPITHELIAL CELLS PRESENT MODERATE GRAM POSITIVE COCCI IN PAIRS    Culture   Final    CULTURE REINCUBATED FOR BETTER GROWTH Performed at [facility], [address] [place], [place] [address]    Report Status PENDING  Incomplete   Time coordinating discharge: 35 minutes  SIGNED:  Merlene Laughtermair Latif Sheikh, DO [facility] 09/25/2018, 7:19 PM Pager is on AMION  If 7PM-7AM, please contact night-coverage [email or web address] Password [email or web address]

## 2018-09-25 NOTE — Progress Notes (Signed)
SATURATION QUALIFICATIONS: (This note is used to comply with regulatory documentation for home oxygen)  Patient Saturations on Room Air at Rest = 95%  Patient Saturations on Room Air while Ambulating = 94-96%  Patient Saturations on n/a Liters of oxygen while Ambulating = n/a  Please briefly explain why patient needs home oxygen: patient does not meet requirements for home oxygen

## 2018-09-25 NOTE — TOC Initial Note (Signed)
Transition of Care [facility]) - Initial/Assessment Note    Patient Details  Name: [name] MRN: [id number] Date of Birth: Feb 01, 1971  Transition of Care (TOC) CM/SW Contact:    Joaquin Courts, RN Phone Number: 09/25/2018, 1:35 PM  Clinical Narrative:   CM spoke with patient regarding need for nebulizer machine. [employer] arranged to deliver nebulizer machine to bedside for d/c home today.                  Expected Discharge Plan: Home/Self Care Barriers to Discharge: No Barriers Identified   Patient Goals and CMS Choice Patient states their goals for this hospitalization and ongoing recovery are:: to go home CMS Medicare.gov Compare Post Acute Care list provided to:: Patient Choice offered to / list presented to : Patient  Expected Discharge Plan and Services Expected Discharge Plan: Home/Self Care   Discharge Planning Services: CM Consult Post Acute Care Choice: Durable Medical Equipment Living arrangements for the past 2 months: Single Family Home Expected Discharge Date: 09/25/18               DME Arranged: Nebulizer machine DME Agency: [employer] Date DME Agency Contacted: 09/25/18 Time DME Agency Contacted: [phone] Representative spoke with at DME Agency: [place]: NA [place] Agency: NA        Prior Living Arrangements/Services Living arrangements for the past 2 months: [place] with:: Parents Patient language and need for interpreter reviewed:: Yes Do you feel safe going back to the place where you live?: Yes      Need for Family Participation in Patient Care: Yes (Comment) Care giver support system in place?: Yes (comment)   Criminal Activity/Legal Involvement Pertinent to Current Situation/Hospitalization: No - Comment as needed  Activities of Daily Living Home Assistive Devices/Equipment: None ADL Screening (condition at time of admission) Patient's cognitive ability adequate to safely complete daily activities?: Yes Is the  patient deaf or have difficulty hearing?: No Does the patient have difficulty seeing, even when wearing glasses/contacts?: No Does the patient have difficulty concentrating, remembering, or making decisions?: No Patient able to express need for assistance with ADLs?: Yes Does the patient have difficulty dressing or bathing?: No Independently performs ADLs?: Yes (appropriate for developmental age) Does the patient have difficulty walking or climbing stairs?: No Weakness of Legs: Both Weakness of Arms/Hands: Both  Permission Sought/Granted                  Emotional Assessment Appearance:: Appears stated age Attitude/Demeanor/Rapport: Engaged Affect (typically observed): Accepting Orientation: : Oriented to Self, Oriented to Place, Oriented to  Time, Oriented to Situation   Psych Involvement: No (comment)  Admission diagnosis:  Community acquired pneumonia, unspecified laterality [J18.9] Pneumonia [J18.9] Patient Active Problem List   Diagnosis Date Noted  . Pneumonia 09/23/2018  . Depression with anxiety 09/23/2018  . Psoriasis 09/23/2018  . Tachyarrhythmia   . Ventral hernia 06/09/2018  . Breast, fat necrosis 05/11/2018  . Posttraumatic stress disorder 04/05/2018  . Panniculitis 03/10/2018  . Major depressive disorder, single episode, unspecified 04/02/2017  . MDD (major depressive disorder), recurrent episode, severe ([place]) 03/24/2017  . Symptomatic mammary hypertrophy 01/09/2016  . Nicotine dependence 12/25/2015   PCP:  Chesley Noon, MD Pharmacy:   [facility] [place], [place] - [address] [place] [address] Phone: [phone] Fax: [phone]  [id number] - [place], [place] - [address] [place] [address] Phone: [phone] Fax: [phone]     Social Determinants of Health (SDOH) Interventions    Readmission Risk Interventions No  flowsheet data found.

## 2018-09-26 LAB — CULTURE, RESPIRATORY W GRAM STAIN
Culture: NORMAL
Gram Stain: NONE SEEN

## 2018-09-26 LAB — LEGIONELLA PNEUMOPHILA SEROGP 1 UR AG: L. pneumophila Serogp 1 Ur Ag: NEGATIVE

## 2018-09-28 LAB — CULTURE, BLOOD (ROUTINE X 2)
Culture: NO GROWTH
Culture: NO GROWTH
Special Requests: ADEQUATE

## 2018-10-06 DIAGNOSIS — J189 Pneumonia, unspecified organism: Secondary | ICD-10-CM

## 2018-10-06 HISTORY — DX: Pneumonia, unspecified organism: J18.9

## 2018-10-17 ENCOUNTER — Encounter: Payer: Self-pay | Admitting: Cardiology

## 2018-10-17 NOTE — Progress Notes (Signed)
Cardiology Office Note   Date:  10/18/2018   ID:  [name], DOB [birth year], MRN [id number]  PCP:  Eartha InchBadger, Michael C, MD  Cardiologist:   Rollene RotundaJames Vinie Charity, MD Referring:  Eartha InchBadger, Michael C, MD   Chief Complaint  Patient presents with  . Tachycardia     History of Present Illness: [name] is a 48 y.o. female who was referred by Eartha InchBadger, Michael C, MD for evaluation of tachycardia.  She was in the hospital earlier this month.  I reviewed these records for this visit.  She was treated for pneumonia. At the time of the admission she mentioned tachycardia going on at night with pressure in her throat.   She is going to have a panniculectomy hernia repair part of this is a preop.  However, she is noted to have rapid heart rate.  She describes occasional palpitations.  These are not particularly problematic.  She does not have presyncope or syncope.  She does however have a rapid heart rate.  She does not really notice this but it is noted when she gets examinations.  She is not particularly active though she does her housework and walks her dogs.  She has stairs.  With this he does not bring on chest pressure, neck or arm discomfort.  She does not have excessive shortness of breath, PND or orthopnea.  She has no palpitations, presyncope or syncope.  She does have some discomfort in her throat only at night.  However, she is had multiple problems with reflux and hiatal hernia in the past.  She does not have substernal chest pressure or arm or jaw discomfort.    Past Medical History:  Diagnosis Date  . Anxiety   . Arthritis    joints  . Asthma   . Endometriosis   . Fat necrosis of breast    right  . GERD (gastroesophageal reflux disease)   . History of hiatal hernia    s/p repair few times  . History of suicide attempt 03/2017  . Hypertension   . MDD (major depressive disorder)   . Panniculitis   . PONV (postoperative nausea and vomiting)   . Psoriasis    treated with  Humeria    Past Surgical History:  Procedure Laterality Date  . ACHILLES TENDON REPAIR Left 01-11-2015  @[facility]   . BREAST REDUCTION SURGERY Bilateral 01/09/2016   Procedure: MAMMARY REDUCTION  (BREAST) BILATERAL;  Surgeon: Peggye Formlaire S Dillingham, DO;  Location: [facility];  Service: Plastics;  Laterality: Bilateral;  . CARPOMETACARPAL (CMC) FUSION OF THUMB Right 05-04-2015   @[facility]   . COMBINED HYSTEROSCOPY DIAGNOSTIC / D&C  06-23-2003     dr Ambrose Mantlehenley @[facility]   . D & C HYSTERSCOPY /  DX LAPAROSCOPY CONVERSION LAPAROTOMY LYSIS ADHESIONS WITH RIGHT SALPINECTOMY  12-26-2003    dr Edward Jollysilva  @[facility]   . LAPAROSCOPIC CHOLECYSTECTOMY  10-19-2002   dr Orson Slickbowman  @[facility]   . LAPAROSCOPIC GASTRIC BANDING  09-25-2003  @[facility]    WITH HIATAL HERNIA REPAIR  . LAPAROSCOPIC REPAIR AND REMOVAL OF GASTRIC BAND  02-06-2006  @[facility]   . LAPAROTOMY LYSIS ADHESIONS , UTERINE BIOPSY  01-31-2000   dr Rana Snarelowe  @[facility]   . OPEN WOUND DEBRIDEMENT ANTERIOR ABDOMINAL WALL , REMOVAL Oswald HillockFORGEIN BODY'S  05-10-2001   dr Orson Slickbowman  @[facility]   . REPAIR RECURRENT  HIATAL HERNIA   04-23-2004  @[facility]   . TAKEDOWN RECURRENT HIATAL HERNIA FROM PREVIOUS MESH REPAIR OF DIAPHRAGM/ REPAIR HIATAL HERNIA AND THE GASTROPEXY  11/05/2004  . TONSILLECTOMY  age 89  . TOTAL ABDOMINAL HYSTERECTOMY W/ BILATERAL SALPINGOOPHORECTOMY  12-23-2005   dr soper   W/  BILATERAL URETEROLYSIS AND EXTENSIVE ABD. LYSIS ADHESIONS  . UMBILICAL HERNIA REPAIR  12/27/2009   LAPAROSCOPY TAKEDOWN INCARCERATED UMBILICAL HERNIA WITH REPAIR/ OPEN REPAIR COMPLEX LOWER INCISIONAL HERNIA     Current Outpatient Medications  Medication Sig Dispense Refill  . acetaminophen (TYLENOL) 500 MG tablet Take 1,000 mg by mouth every 4 (four) hours as needed for fever or headache.    . albuterol (PROVENTIL HFA;VENTOLIN HFA) 108 (90 BASE) MCG/ACT inhaler Inhale 2 puffs into the lungs every 6 (six) hours as needed. For shortness of breath    . ALPRAZolam (XANAX) 0.5 MG tablet Take 0.5 mg by mouth 3 (three) times daily as  needed for anxiety.     Marland Kitchen. azelastine (ASTELIN) 137 MCG/SPRAY nasal spray Place 2 sprays into both nostrils 2 (two) times daily as needed for rhinitis. Use in each nostril as directed    . b complex vitamins tablet Take 1 tablet by mouth daily.    . Biotin [id number] MCG TABS Take 10,000 mcg by mouth daily.    Marland Kitchen. buPROPion (WELLBUTRIN XL) 300 MG 24 hr tablet Take 300 mg by mouth daily.    . cetirizine (ZYRTEC) 10 MG tablet Take 10 mg by mouth daily as needed for allergies.     Marland Kitchen. HYDROcodone-acetaminophen (NORCO) 5-325 MG tablet Take 1 tablet by mouth every 6 (six) hours as needed for moderate pain. 30 tablet 0  . ipratropium-albuterol (DUONEB) 0.5-2.5 (3) MG/3ML SOLN Take 3 mLs by nebulization every 6 (six) hours as needed. 360 mL 0  . mirtazapine (REMERON) 15 MG tablet Take 1 tablet (15 mg total) by mouth at bedtime. For mood control 30 tablet 0  . montelukast (SINGULAIR) 10 MG tablet Take 10 mg by mouth at bedtime.     . Multiple Vitamin (MULTIVITAMIN) tablet Take 1 tablet by mouth daily.    Marland Kitchen. olmesartan (BENICAR) 40 MG tablet Take 40 mg by mouth every morning.     . pantoprazole (PROTONIX) 40 MG tablet Take 40 mg by mouth 2 (two) times daily.     . polyethylene glycol (MIRALAX / GLYCOLAX) 17 g packet Take 17 g by mouth daily as needed for mild constipation. 14 each 0  . promethazine (PHENERGAN) 25 MG tablet Take 25 mg by mouth every 8 (eight) hours as needed for nausea or vomiting.     . traZODone (DESYREL) 50 MG tablet Take 1 tablet (50 mg total) by mouth at bedtime as needed for sleep. For sleep (Patient taking differently: Take 50 mg by mouth at bedtime. For sleep) 30 tablet 0  . venlafaxine XR (EFFEXOR-XR) 150 MG 24 hr capsule Take 1 capsule by mouth daily with breakfast.     . vitamin A 8000 UNIT capsule Take 8,000 Units by mouth daily.    . vitamin B-12 (CYANOCOBALAMIN) 1000 MCG tablet Take 1,000 mcg by mouth daily.     No current facility-administered medications for this visit.      Allergies:   Influenza vaccines and Lisinopril-hydrochlorothiazide    Social History:  The patient  reports that she quit smoking about 3 years ago. Her smoking use included cigarettes. She has a 20.00 pack-year smoking history. She has never used smokeless tobacco. She reports that she does not drink alcohol or use drugs.   Family History:  The patient's family history includes Cancer in her maternal aunt; Diabetes in her father; Heart attack (age of  onset: 2848) in her father.    ROS:  Please see the history of present illness.   Otherwise, review of systems are positive for none.   All other systems are reviewed and negative.    PHYSICAL EXAM: VS:  BP 122/80   Pulse (!) 115   Temp (!) 97.5 F (36.4 C)   Ht 5\' 11"  (1.803 m)   Wt (!) 342 lb (155.1 kg)   BMI 47.70 kg/m  , BMI Body mass index is 47.7 kg/m. GENERAL:  Well appearing HEENT:  Pupils equal round and reactive, fundi not visualized, oral mucosa unremarkable NECK:  No jugular venous distention, waveform within normal limits, carotid upstroke brisk and symmetric, no bruits, no thyromegaly LYMPHATICS:  No cervical, inguinal adenopathy LUNGS:  Clear to auscultation bilaterally BACK:  No CVA tenderness CHEST:  Unremarkable HEART:  PMI not displaced or sustained,S1 and S2 within normal limits, no S3, no S4, no clicks, no rubs, no murmurs ABD:  Flat, positive bowel sounds normal in frequency in pitch, no bruits, no rebound, no guarding, no midline pulsatile mass, no hepatomegaly, no splenomegaly EXT:  2 plus pulses throughout, no edema, no cyanosis no clubbing SKIN:  No rashes no nodules NEURO:  Cranial nerves II through XII grossly intact, motor grossly intact throughout PSYCH:  Cognitively intact, oriented to person place and time    EKG:  EKG is ordered today. The ekg ordered today demonstrates sinus tachycardia, rate 115, axis within normal limits, intervals within normal limits, no acute ST-T wave changes.   Recent  Labs: 09/25/2018: ALT 20; BUN 15; Creatinine, Ser 0.91; Hemoglobin 12.7; Magnesium 2.2; Platelets 220; Potassium 4.5; Sodium 140    Lipid Panel No results found for: CHOL, TRIG, HDL, CHOLHDL, VLDL, LDLCALC, LDLDIRECT    Wt Readings from Last 3 Encounters:  10/18/18 (!) 342 lb (155.1 kg)  09/24/18 (!) 345 lb 9.6 oz (156.8 kg)  06/22/18 (!) 315 lb 8 oz (143.1 kg)      Other studies Reviewed: Additional studies/ records that were reviewed today include: Hospital record. Review of the above records demonstrates:  Please see elsewhere in the note.     ASSESSMENT AND PLAN:  TACHYCARDIA :    She was not orthostatic in the office.  I will check a TSH and T4.  She is going to wear a Holter to see if she has normal diurnal variation.  I will have a low threshold for echocardiography.  THROAT DISCOMFORT: This is very atypical.  It seems to be most associated with her GI complaints.  I do not strongly suspect obstructive coronary disease.  No ischemia work-up is suggested at this point.   Current medicines are reviewed at length with the patient today.  The patient does not have concerns regarding medicines.  The following changes have been made:  no change  Labs/ tests ordered today include:   Orders Placed This Encounter  Procedures  . T4, free  . TSH  . LONG TERM MONITOR (3-14 DAYS)     Disposition:   FU with after the monitor.  Can be a virtual visit.  Signed, Minus Breeding, MD  10/18/2018 11:34 AM    [facility]

## 2018-10-18 ENCOUNTER — Other Ambulatory Visit: Payer: Self-pay

## 2018-10-18 ENCOUNTER — Encounter: Payer: Self-pay | Admitting: Cardiology

## 2018-10-18 ENCOUNTER — Ambulatory Visit (INDEPENDENT_AMBULATORY_CARE_PROVIDER_SITE_OTHER): Payer: Medicare HMO | Admitting: Cardiology

## 2018-10-18 VITALS — BP 122/80 | HR 115 | Temp 97.5°F | Ht 71.0 in | Wt 342.0 lb

## 2018-10-18 DIAGNOSIS — R070 Pain in throat: Secondary | ICD-10-CM | POA: Diagnosis not present

## 2018-10-18 DIAGNOSIS — R000 Tachycardia, unspecified: Secondary | ICD-10-CM

## 2018-10-18 NOTE — Addendum Note (Signed)
Addended by: Alvina Filbert B on: 10/18/2018 01:30 PM   Modules accepted: Orders

## 2018-10-18 NOTE — Patient Instructions (Addendum)
Medication Instructions:  Your physician recommends that you continue on your current medications as directed. Please refer to the Current Medication list given to you today.  If you need a refill on your cardiac medications before your next appointment, please call your pharmacy.   Lab work: T4/TSH TODAY   If you have labs (blood work) drawn today and your tests are completely normal, you will receive your results only by: Marland Kitchen MyChart Message (if you have MyChart) OR . A paper copy in the mail If you have any lab test that is abnormal or we need to change your treatment, we will call you to review the results.  Testing/Procedures: 3 DAY ZIO MONITOR   Follow-Up: Your physician recommends that you schedule a follow-up appointment in: DR [place], VIRTUAL VISIT [place]

## 2018-10-19 ENCOUNTER — Telehealth: Payer: Self-pay

## 2018-10-19 ENCOUNTER — Telehealth: Payer: Self-pay | Admitting: *Deleted

## 2018-10-19 LAB — TSH: TSH: 1.75 u[IU]/mL (ref 0.450–4.500)

## 2018-10-19 LAB — T4, FREE: Free T4: 0.98 ng/dL (ref 0.82–1.77)

## 2018-10-19 NOTE — Telephone Encounter (Signed)
   [facility] Pre-operative Risk Assessment    Request for surgical clearance:  1. What type of surgery is being performed? VENTRAL HERNIA REPAIR   2. When is this surgery scheduled? TBD   3. What type of clearance is required (medical clearance vs. Pharmacy clearance to hold med vs. Both)? MEDICAL  4. Are there any medications that need to be held prior to surgery and how long? NO   5. Practice name and name of physician performing surgery? [facility], [facility] [place]   6. What is your office phone number [phone]    7.   What is your office fax number [phone]  8.   Anesthesia type (None, local, MAC, general) ?  GENERAL   [name] 10/19/2018, 4:27 PM  _________________________________________________________________   (provider comments below)

## 2018-10-19 NOTE — Telephone Encounter (Signed)
3 day ZIO XT long term holter monitor to be mailed to patients home.  Instructions reviewed briefly as they are included in the monitor kit. 

## 2018-10-20 NOTE — Telephone Encounter (Signed)
Pt's monitor has been mailed

## 2018-10-20 NOTE — Telephone Encounter (Signed)
The plan was to see this monitor first before preop clearance.

## 2018-10-20 NOTE — Telephone Encounter (Signed)
Dr. Percival Spanish, pt needs to have ventral hernia repair with general anesthesia.  You have just evaluated in office.  Can we clear or do you have any other recommendations?

## 2018-10-20 NOTE — Telephone Encounter (Signed)
Cannot clear until results of monitor.   Will ask pre-op callback to see if we can have done soon.

## 2018-10-27 ENCOUNTER — Ambulatory Visit (INDEPENDENT_AMBULATORY_CARE_PROVIDER_SITE_OTHER): Payer: Medicare HMO

## 2018-10-27 DIAGNOSIS — R000 Tachycardia, unspecified: Secondary | ICD-10-CM

## 2018-11-01 NOTE — Telephone Encounter (Signed)
   Primary Cardiologist:James Hochrein, MD  Chart reviewed as part of pre-operative protocol coverage.   Callback, can you check on status of monitor read?  Charlie Pitter, PA-C 11/01/2018, 8:14 AM

## 2018-11-02 NOTE — Telephone Encounter (Signed)
Per Tracking info patients monitor was delivered on 7-21 @ 7am. Patient was suppose to wear for 3 days and mail back. At this time it does not look like patient has mailed monitor back as there is no tracking info available.

## 2018-11-02 NOTE — Telephone Encounter (Signed)
Surgeon's office notified waiting on pt's monitor results before pt can be cleared for pending surgery.

## 2018-11-02 NOTE — Telephone Encounter (Signed)
S/w pt mailed back monitor on Monday, July 27. This is pending surgical clearance.

## 2018-11-02 NOTE — Telephone Encounter (Signed)
Callback, please call patient to inquire about monitor status as we are waiting on clearance for surgery for this information (see Katy's reply). Please also let surgeon know that clearance is pending monitor.

## 2018-11-03 ENCOUNTER — Emergency Department (HOSPITAL_COMMUNITY): Payer: Medicare HMO

## 2018-11-03 ENCOUNTER — Emergency Department (HOSPITAL_COMMUNITY)
Admission: EM | Admit: 2018-11-03 | Discharge: 2018-11-03 | Disposition: A | Discharge status: Home / Self Care | Payer: Medicare HMO | Attending: Emergency Medicine | Admitting: Emergency Medicine

## 2018-11-03 ENCOUNTER — Encounter (HOSPITAL_COMMUNITY): Payer: Self-pay | Admitting: *Deleted

## 2018-11-03 ENCOUNTER — Other Ambulatory Visit: Payer: Self-pay

## 2018-11-03 DIAGNOSIS — R042 Hemoptysis: Secondary | ICD-10-CM | POA: Insufficient documentation

## 2018-11-03 DIAGNOSIS — Z5321 Procedure and treatment not carried out due to patient leaving prior to being seen by health care provider: Secondary | ICD-10-CM | POA: Insufficient documentation

## 2018-11-03 NOTE — Telephone Encounter (Signed)
Monitor update.  According to [employer] tracking, ZIO monitor is in route back to [facility].  Estimated time of arrival 11/05/18 by 8:00 PM.

## 2018-11-03 NOTE — ED Notes (Signed)
Reported by registration that pt left at [facility]

## 2018-11-03 NOTE — ED Triage Notes (Signed)
Pt dx with PNA yesterday and states sob and pain has increased since.  Pt has taken medications as prescribed. +fevers

## 2018-11-05 ENCOUNTER — Emergency Department (HOSPITAL_COMMUNITY): Payer: Medicare HMO

## 2018-11-05 ENCOUNTER — Other Ambulatory Visit: Payer: Self-pay

## 2018-11-05 ENCOUNTER — Encounter (HOSPITAL_COMMUNITY): Payer: Self-pay | Admitting: Emergency Medicine

## 2018-11-05 ENCOUNTER — Emergency Department (HOSPITAL_COMMUNITY)
Admission: EM | Admit: 2018-11-05 | Discharge: 2018-11-06 | Disposition: A | Discharge status: Home / Self Care | Payer: Medicare HMO | Attending: Emergency Medicine | Admitting: Emergency Medicine

## 2018-11-05 DIAGNOSIS — R531 Weakness: Secondary | ICD-10-CM | POA: Diagnosis not present

## 2018-11-05 DIAGNOSIS — J45909 Unspecified asthma, uncomplicated: Secondary | ICD-10-CM | POA: Diagnosis not present

## 2018-11-05 DIAGNOSIS — R042 Hemoptysis: Secondary | ICD-10-CM

## 2018-11-05 DIAGNOSIS — Z79899 Other long term (current) drug therapy: Secondary | ICD-10-CM | POA: Diagnosis not present

## 2018-11-05 DIAGNOSIS — Z87891 Personal history of nicotine dependence: Secondary | ICD-10-CM | POA: Insufficient documentation

## 2018-11-05 DIAGNOSIS — R0602 Shortness of breath: Secondary | ICD-10-CM | POA: Diagnosis present

## 2018-11-05 DIAGNOSIS — R5381 Other malaise: Secondary | ICD-10-CM | POA: Insufficient documentation

## 2018-11-05 DIAGNOSIS — R079 Chest pain, unspecified: Secondary | ICD-10-CM | POA: Insufficient documentation

## 2018-11-05 LAB — CBC WITH DIFFERENTIAL/PLATELET
Abs Immature Granulocytes: 0.03 10*3/uL (ref 0.00–0.07)
Basophils Absolute: 0 10*3/uL (ref 0.0–0.1)
Basophils Relative: 0 %
Eosinophils Absolute: 0.2 10*3/uL (ref 0.0–0.5)
Eosinophils Relative: 2 %
HCT: 46.8 % — ABNORMAL HIGH (ref 36.0–46.0)
Hemoglobin: 15.2 g/dL — ABNORMAL HIGH (ref 12.0–15.0)
Immature Granulocytes: 0 %
Lymphocytes Relative: 28 %
Lymphs Abs: 3.1 10*3/uL (ref 0.7–4.0)
MCH: 32.8 pg (ref 26.0–34.0)
MCHC: 32.5 g/dL (ref 30.0–36.0)
MCV: 101.1 fL — ABNORMAL HIGH (ref 80.0–100.0)
Monocytes Absolute: 0.6 10*3/uL (ref 0.1–1.0)
Monocytes Relative: 5 %
Neutro Abs: 7 10*3/uL (ref 1.7–7.7)
Neutrophils Relative %: 65 %
Platelets: 282 10*3/uL (ref 150–400)
RBC: 4.63 MIL/uL (ref 3.87–5.11)
RDW: 12.8 % (ref 11.5–15.5)
WBC: 11 10*3/uL — ABNORMAL HIGH (ref 4.0–10.5)
nRBC: 0 % (ref 0.0–0.2)

## 2018-11-05 LAB — COMPREHENSIVE METABOLIC PANEL
ALT: 23 U/L (ref 0–44)
AST: 23 U/L (ref 15–41)
Albumin: 3.9 g/dL (ref 3.5–5.0)
Alkaline Phosphatase: 64 U/L (ref 38–126)
Anion gap: 10 (ref 5–15)
BUN: 15 mg/dL (ref 6–20)
CO2: 28 mmol/L (ref 22–32)
Calcium: 9.1 mg/dL (ref 8.9–10.3)
Chloride: 102 mmol/L (ref 98–111)
Creatinine, Ser: 0.92 mg/dL (ref 0.44–1.00)
GFR calc Af Amer: >60 mL/min (ref >60)
GFR calc non Af Amer: >60 mL/min (ref >60)
Glucose, Bld: 89 mg/dL (ref 70–99)
Potassium: 3.9 mmol/L (ref 3.5–5.1)
Sodium: 140 mmol/L (ref 135–145)
Total Bilirubin: 0.3 mg/dL (ref 0.3–1.2)
Total Protein: 7.8 g/dL (ref 6.5–8.1)

## 2018-11-05 LAB — URINALYSIS, ROUTINE W REFLEX MICROSCOPIC
Bilirubin Urine: NEGATIVE
Glucose, UA: NEGATIVE mg/dL
Hgb urine dipstick: NEGATIVE
Ketones, ur: NEGATIVE mg/dL
Leukocytes,Ua: NEGATIVE
Nitrite: NEGATIVE
Protein, ur: NEGATIVE mg/dL
Specific Gravity, Urine: 1.015 (ref 1.005–1.030)
pH: 6 (ref 5.0–8.0)

## 2018-11-05 LAB — PROTIME-INR
INR: 1 (ref 0.8–1.2)
Prothrombin Time: 12.6 seconds (ref 11.4–15.2)

## 2018-11-05 LAB — PROCALCITONIN: Procalcitonin: <0.1 ng/mL

## 2018-11-05 LAB — LACTIC ACID, PLASMA: Lactic Acid, Venous: 1.3 mmol/L (ref 0.5–1.9)

## 2018-11-05 LAB — D-DIMER, QUANTITATIVE: D-Dimer, Quant: 0.47 ug/mL-FEU (ref 0.00–0.50)

## 2018-11-05 LAB — APTT: aPTT: 35 seconds (ref 24–36)

## 2018-11-05 MED ORDER — SODIUM CHLORIDE 0.9 % IV BOLUS (SEPSIS)
1000.0000 mL | Freq: Once | INTRAVENOUS | Status: AC
  Administered 2018-11-05: 1000 mL via INTRAVENOUS

## 2018-11-05 MED ORDER — IOHEXOL 350 MG/ML SOLN
100.0000 mL | Freq: Once | INTRAVENOUS | Status: AC | PRN
  Administered 2018-11-05: 100 mL via INTRAVENOUS

## 2018-11-05 MED ORDER — SODIUM CHLORIDE 0.9 % IV SOLN
1000.0000 mL | INTRAVENOUS | Status: DC
  Administered 2018-11-05: 1000 mL via INTRAVENOUS

## 2018-11-05 MED ORDER — SODIUM CHLORIDE (PF) 0.9 % IJ SOLN
INTRAMUSCULAR | Status: AC
  Filled 2018-11-05: qty 50

## 2018-11-05 MED ORDER — METOCLOPRAMIDE HCL 5 MG/ML IJ SOLN
10.0000 mg | Freq: Once | INTRAMUSCULAR | Status: AC
  Administered 2018-11-05: 10 mg via INTRAVENOUS
  Filled 2018-11-05: qty 2

## 2018-11-05 MED ORDER — KETOROLAC TROMETHAMINE 15 MG/ML IJ SOLN
15.0000 mg | Freq: Once | INTRAMUSCULAR | Status: AC
  Administered 2018-11-05: 15 mg via INTRAVENOUS
  Filled 2018-11-05: qty 1

## 2018-11-05 NOTE — ED Triage Notes (Signed)
Received call from pt's boyfriend who states pt is in the waiting room and that she called him to say she was suicidal. Pt brought back into triage to reassess. Pt denies SI and states she "is done with him" but has nothing to do with her taking her own life.   Pt remains 97% on RA.

## 2018-11-05 NOTE — Discharge Instructions (Addendum)
We saw you in the ER for chest pain, shortness of breath and cough. All the results in the ER are normal, labs and imaging. We are not sure what is causing your symptoms. The workup in the ER is not complete, and is limited to screening for life threatening and emergent conditions only, so please see a primary care doctor for further evaluation.

## 2018-11-05 NOTE — ED Notes (Signed)
Pt was placed on a purewick for urine sample.

## 2018-11-05 NOTE — ED Triage Notes (Signed)
Per [facility] Pt has bilat PNA dx on 7/28 and given medications that havent helped her SOB. Pt was admitted here 6 weeks ago for PNA. Pt was picked up at PCP office. 124/67, 99HR, 97% onRA.

## 2018-11-06 NOTE — ED Provider Notes (Signed)
[facility]-EMERGENCY DEPT Provider Note   CSN: [id number] Arrival date & time: 11/05/18  1252     History   Chief Complaint Chief Complaint  Patient presents with   Pneumonia   Shortness of Breath    HPI [name] is a 48 y.o. female.     HPI  48 year old female comes into the ER with chief complaint of shortness of breath, chest pain and concerns for pneumonia. Patient states that 3 months ago she was admitted to the hospital because of pneumonia.  1 week ago she started having chest pain that is similar to her pneumonia.  Chest pain is left-sided, lower and radiates to the back.  Her pain is worse with deep inspiration.  She is also having shortness of breath.  The pain is fairly constant.  She also has appreciated some weakness and malaise.  There is no associated diaphoresis, nausea, vomiting.  Patient saw her PCP/urgent care recently and was diagnosed with pneumonia and started on Levaquin.  She was COVID negative.   She denies any history of coronary artery disease.  Patient's pain at 12 point has been exertional.  She saw another provider earlier today and was advised to come to the ER for further evaluation.  Past Medical History:  Diagnosis Date   Anxiety    Arthritis    joints   Asthma    Endometriosis    Fat necrosis of breast    right   GERD (gastroesophageal reflux disease)    History of hiatal hernia    s/p repair few times   History of suicide attempt 03/2017   Hypertension    MDD (major depressive disorder)    Panniculitis    PONV (postoperative nausea and vomiting)    Psoriasis    treated with Humeria    Patient Active Problem List   Diagnosis Date Noted   Pneumonia 09/23/2018   Depression with anxiety 09/23/2018   Psoriasis 09/23/2018   Tachyarrhythmia    Ventral hernia 06/09/2018   Breast, fat necrosis 05/11/2018   Posttraumatic stress disorder 04/05/2018   Panniculitis 03/10/2018   Major  depressive disorder, single episode, unspecified 04/02/2017   MDD (major depressive disorder), recurrent episode, severe (HCC) 03/24/2017   Symptomatic mammary hypertrophy 01/09/2016   Nicotine dependence 12/25/2015    Past Surgical History:  Procedure Laterality Date   ACHILLES TENDON REPAIR Left 01-11-2015  @[facility]    BREAST REDUCTION SURGERY Bilateral 01/09/2016   Procedure: MAMMARY REDUCTION  (BREAST) BILATERAL;  Surgeon: Peggye Form, DO;  Location: [facility];  Service: Plastics;  Laterality: Bilateral;   CARPOMETACARPAL (CMC) FUSION OF THUMB Right 05-04-2015   @[facility]    COMBINED HYSTEROSCOPY DIAGNOSTIC / D&C  06-23-2003     dr Ambrose Mantle @[facility]    D & C HYSTERSCOPY /  DX LAPAROSCOPY CONVERSION LAPAROTOMY LYSIS ADHESIONS WITH RIGHT SALPINECTOMY  12-26-2003    dr Edward Jolly  @[facility]    LAPAROSCOPIC CHOLECYSTECTOMY  10-19-2002   dr Orson Slick  @[facility]    LAPAROSCOPIC GASTRIC BANDING  09-25-2003  @[facility]    WITH HIATAL HERNIA REPAIR   LAPAROSCOPIC REPAIR AND REMOVAL OF GASTRIC BAND  02-06-2006  @[facility]    LAPAROTOMY LYSIS ADHESIONS , UTERINE BIOPSY  01-31-2000   dr Rana Snare  @[facility]    OPEN WOUND DEBRIDEMENT ANTERIOR ABDOMINAL WALL , REMOVAL FORGEIN BODY'S  05-10-2001   dr Orson Slick  @[facility]    REPAIR RECURRENT  HIATAL HERNIA   04-23-2004  @[facility]    TAKEDOWN RECURRENT HIATAL HERNIA FROM PREVIOUS MESH REPAIR OF DIAPHRAGM/  REPAIR HIATAL HERNIA AND THE GASTROPEXY  11/05/2004   TONSILLECTOMY  age 28   TOTAL ABDOMINAL HYSTERECTOMY W/ BILATERAL SALPINGOOPHORECTOMY  12-23-2005   dr soper   W/  BILATERAL URETEROLYSIS AND EXTENSIVE ABD. LYSIS ADHESIONS   UMBILICAL HERNIA REPAIR  12/27/2009   LAPAROSCOPY TAKEDOWN INCARCERATED UMBILICAL HERNIA WITH REPAIR/ OPEN REPAIR COMPLEX LOWER INCISIONAL HERNIA     OB History   No obstetric history on file.      Home Medications    Prior to Admission medications   Medication Sig Start Date End Date Taking? Authorizing Provider  acetaminophen (TYLENOL) 500 MG tablet Take  1,000 mg by mouth every 4 (four) hours as needed for fever or headache.    [provider]  albuterol (PROVENTIL HFA;VENTOLIN HFA) 108 (90 BASE) MCG/ACT inhaler Inhale 2 puffs into the lungs every 6 (six) hours as needed. For shortness of breath    [provider]  ALPRAZolam (XANAX) 0.5 MG tablet Take 0.5 mg by mouth 3 (three) times daily as needed for anxiety.  05/06/18   [provider]  azelastine (ASTELIN) 137 MCG/SPRAY nasal spray Place 2 sprays into both nostrils 2 (two) times daily as needed for rhinitis. Use in each nostril as directed    [provider]  b complex vitamins tablet Take 1 tablet by mouth daily.    [provider]  Biotin 1610910000 MCG TABS Take 10,000 mcg by mouth daily.    [provider]  buPROPion (WELLBUTRIN XL) 300 MG 24 hr tablet Take 300 mg by mouth daily.    [provider]  cetirizine (ZYRTEC) 10 MG tablet Take 10 mg by mouth daily as needed for allergies.     [provider]  HYDROcodone-acetaminophen (NORCO) 5-325 MG tablet Take 1 tablet by mouth every 6 (six) hours as needed for moderate pain. 06/18/18   Dillingham, Alena Billslaire S, DO  ipratropium-albuterol (DUONEB) 0.5-2.5 (3) MG/3ML SOLN Take 3 mLs by nebulization every 6 (six) hours as needed. 09/25/18   Marguerita MerlesSheikh, Omair Latif, DO  mirtazapine (REMERON) 15 MG tablet Take 1 tablet (15 mg total) by mouth at bedtime. For mood control 03/27/17   Money, Feliz Beamravis B, FNP  montelukast (SINGULAIR) 10 MG tablet Take 10 mg by mouth at bedtime.  02/04/17   [provider]  Multiple Vitamin (MULTIVITAMIN) tablet Take 1 tablet by mouth daily.    [provider]  olmesartan (BENICAR) 40 MG tablet Take 40 mg by mouth every morning.  04/13/17   [provider]  pantoprazole (PROTONIX) 40 MG tablet Take 40 mg by mouth 2 (two) times daily.     [provider]  polyethylene glycol (MIRALAX / GLYCOLAX) 17 g packet Take 17 g by mouth daily as  needed for mild constipation. 09/25/18   Marguerita MerlesSheikh, Omair Latif, DO  promethazine (PHENERGAN) 25 MG tablet Take 25 mg by mouth every 8 (eight) hours as needed for nausea or vomiting.  12/31/17   [provider]  traZODone (DESYREL) 50 MG tablet Take 1 tablet (50 mg total) by mouth at bedtime as needed for sleep. For sleep Patient taking differently: Take 50 mg by mouth at bedtime. For sleep 03/27/17   Money, Gerlene Burdockravis B, FNP  venlafaxine XR (EFFEXOR-XR) 150 MG 24 hr capsule Take 1 capsule by mouth daily with breakfast.  05/04/18   [provider]  vitamin A 8000 UNIT capsule Take 8,000 Units by mouth daily.    [provider]  vitamin B-12 (CYANOCOBALAMIN) 1000 MCG tablet Take  1,000 mcg by mouth daily.    [provider]    Family History Family History  Problem Relation Age of Onset   Cancer Maternal Aunt        melanoma   Diabetes Father    Heart attack Father 22    Social History Social History   Tobacco Use   Smoking status: Former Smoker    Packs/day: 1.00    Years: 20.00    Pack years: 20.00    Types: Cigarettes    Quit date: 05/20/2015    Years since quitting: 3.4   Smokeless tobacco: Never Used  Substance Use Topics   Alcohol use: No   Drug use: Never     Allergies   Influenza vaccines and Lisinopril-hydrochlorothiazide   Review of Systems Review of Systems  Constitutional: Positive for activity change.  Respiratory: Positive for cough and shortness of breath.   Cardiovascular: Positive for chest pain.  Gastrointestinal: Negative for abdominal pain.  Allergic/Immunologic: Negative for immunocompromised state.  All other systems reviewed and are negative.    Physical Exam Updated Vital Signs BP 116/76 (BP Location: Left Arm)    Pulse 94    Temp 98.6 F (37 C) (Oral)    Resp 20    SpO2 96%   Physical Exam Vitals signs and nursing note reviewed.  Constitutional:      Appearance: She is well-developed.  HENT:     Head:  Normocephalic and atraumatic.  Neck:     Musculoskeletal: Normal range of motion and neck supple.  Cardiovascular:     Rate and Rhythm: Normal rate.  Pulmonary:     Effort: Pulmonary effort is normal.     Breath sounds: No wheezing.  Abdominal:     General: Bowel sounds are normal.  Skin:    General: Skin is warm and dry.  Neurological:     Mental Status: She is alert and oriented to person, place, and time.      ED Treatments / Results  Labs (all labs ordered are listed, but only abnormal results are displayed) Labs Reviewed  CBC WITH DIFFERENTIAL/PLATELET - Abnormal; Notable for the following components:      Result Value   WBC 11.0 (*)    Hemoglobin 15.2 (*)    HCT 46.8 (*)    MCV 101.1 (*)    All other components within normal limits  CULTURE, BLOOD (ROUTINE X 2)  CULTURE, BLOOD (ROUTINE X 2)  URINE CULTURE  LACTIC ACID, PLASMA  COMPREHENSIVE METABOLIC PANEL  APTT  PROTIME-INR  URINALYSIS, ROUTINE W REFLEX MICROSCOPIC  PROCALCITONIN  D-DIMER, QUANTITATIVE (NOT AT [facility])    EKG None ED ECG REPORT   Date: 11/06/2018  Rate: 92  Rhythm: normal sinus rhythm  QRS Axis: normal  Intervals: normal  ST/T Wave abnormalities: nonspecific ST/T changes  Conduction Disutrbances:none  Narrative Interpretation:   Old EKG Reviewed: none available  I have personally reviewed the EKG tracing and agree with the computerized printout as noted.  Radiology Dg Chest 2 View  Result Date: 11/05/2018 CLINICAL DATA:  Patient was diagnosed with pneumonia recently. EXAM: CHEST - 2 VIEW COMPARISON:  November 02, 2018 FINDINGS: The heart size and mediastinal contours are within normal limits. There is no focal infiltrate, pulmonary edema, or pleural effusion. The visualized skeletal structures are unremarkable. IMPRESSION: No active cardiopulmonary disease. Electronically Signed   By: Abelardo Diesel M.D.   On: 11/05/2018 19:19   Ct Angio Chest Pe W And/or Wo Contrast  Result Date:  11/05/2018 CLINICAL DATA:  Chest pain, suspected PE, history of recent pneumonia EXAM: CT ANGIOGRAPHY CHEST WITH CONTRAST TECHNIQUE: Multidetector CT imaging of the chest was performed using the standard protocol during bolus administration of intravenous contrast. Multiplanar CT image reconstructions and MIPs were obtained to evaluate the vascular anatomy. CONTRAST:  100mL OMNIPAQUE IOHEXOL 350 MG/ML SOLN COMPARISON:  Multiple priors, most recent radiograph November 05, 2018 FINDINGS: Cardiovascular: There is a slightly suboptimal opacification of the pulmonary arteries. However there is no central,segmental filling defects within the pulmonary arteries. The heart is normal in size. No pericardial effusion thickening. No evidence right heart strain. There is normal three-vessel brachiocephalic anatomy without proximal stenosis. The thoracic aorta is normal in appearance. Mediastinum/Nodes: No hilar, mediastinal, or axillary adenopathy. Thyroid gland, trachea, and esophagus demonstrate no significant findings. Lungs/Pleura: Minimal streaky opacity of both lung bases, likely atelectasis. No large airspace consolidation. No pleural effusion. 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 Surgical clips seen in the gallbladder bed. Musculoskeletal: No chest wall abnormality. No acute or significant osseous findings. Review of the MIP images confirms the above findings. IMPRESSION: 1. Slightly suboptimal pulmonary artery opacification, however no central or segmental pulmonary embolism. 2. 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 3. Minimal basilar atelectasis. Electronically Signed   By: Jonna ClarkBindu  Avutu M.D.   On: 11/05/2018 23:32    Procedures Procedures (including critical care time)  Medications Ordered in ED Medications  sodium chloride 0.9 % bolus 1,000 mL (0 mLs Intravenous Stopped 11/05/18 2304)  ketorolac (TORADOL) 15 MG/ML injection 15 mg (15  mg Intravenous Given 11/05/18 2052)  metoCLOPramide (REGLAN) injection 10 mg (10 mg Intravenous Given 11/05/18 2052)  iohexol (OMNIPAQUE) 350 MG/ML injection 100 mL (100 mLs Intravenous Contrast Given 11/05/18 2314)     Initial Impression / Assessment and Plan / ED Course  I have reviewed the triage vital signs and the nursing notes.  Pertinent labs & imaging results that were available during my care of the patient were reviewed by me and considered in my medical decision making (see chart for details).       48 year old female comes in a chief complaint of chest pain.  Her chest pain is pleuritic, fairly constant and she has associated cough with hemoptysis.  She is also having malaise, nausea, subjective fevers with chills.  She recently was diagnosed with pneumonia by either an urgent care or PCP and started on Levaquin.  She continues to have the symptoms at the time of follow-up today and was advised to come to the ER.  Patient denies any history of PE, DVT.  She has had admission about 3 months ago for pneumonia and states that her symptoms are pretty similar to her pneumonia.  Patient denies any exposures to COVID-19 patients.  She actually had an outpatient COVID-19 test a week ago at the urgent care which was negative.  Patient does not have any history of coronary artery disease.  Her pain is not exertional.  I do not think she has ACS.  EKG ordered and has no ischemic changes that are acute in nature.  Basic labs ordered along with chest x-ray.  All of her lab results including d-dimer were negative.  D-dimer was on the higher end of normal.  I reassessed the patient and since ACS was still not high on the differential diagnosis, PE appeared more likely given the pleuritic component to her pain and hemoptysis, we proceeded with a CT angio  chest, which does not show pneumonia or PE.  The patient appears reasonably screened and/or stabilized for discharge and I doubt any other medical  condition or other [facility] requiring further screening, evaluation, or treatment in the ED at this time prior to discharge.   Results from the ER workup discussed with the patient face to face and all questions answered to the best of my ability. The patient is safe for discharge with strict return precautions.    Final Clinical Impressions(s) / ED Diagnoses   Final diagnoses:  Nonspecific chest pain  Hemoptysis    ED Discharge Orders    None       Derwood KaplanNanavati, Cassius Cullinane, MD 11/06/18 1621

## 2018-11-07 LAB — URINE CULTURE: Culture: 80000 — AB

## 2018-11-08 ENCOUNTER — Telehealth: Payer: Self-pay | Admitting: *Deleted

## 2018-11-08 NOTE — Telephone Encounter (Signed)
Post ED Visit - Positive Culture Follow-up  Culture report reviewed by antimicrobial stewardship pharmacist: [place] Team []  Elenor Quinones, Pharm.D. []  Heide Guile, Pharm.D., BCPS AQ-ID []  Parks Neptune, Pharm.D., BCPS []  Alycia Rossetti, Pharm.D., BCPS []  [place], [place].D., BCPS, AAHIVP []  Legrand Como, Pharm.D., BCPS, AAHIVP []  Salome Arnt, PharmD, BCPS []  Johnnette Gourd, PharmD, BCPS []  Hughes Better, PharmD, BCPS []  Leeroy Cha, PharmD []  Laqueta Linden, PharmD, BCPS []  Albertina Parr, PharmD  [place] Team []  Leodis Sias, PharmD []  Lindell Spar, PharmD []  Royetta Asal, PharmD []  Graylin Shiver, Rph []  Rema Fendt) Glennon Mac, PharmD []  Arlyn Dunning, PharmD []  Netta Cedars, PharmD []  Dia Sitter, PharmD []  Leone Haven, PharmD []  Gretta Arab, PharmD []  Theodis Shove, PharmD []  Peggyann Juba, PharmD [x]  Reuel Boom, PharmD   Positive urine culture, reviewed by Toribio Harbour, MD Likely contaminant and no further patient follow-up is required at this time.  Harlon Flor [facility] 11/08/2018, 1:38 PM

## 2018-11-10 LAB — CULTURE, BLOOD (ROUTINE X 2)
Culture: NO GROWTH
Culture: NO GROWTH

## 2018-11-10 NOTE — Telephone Encounter (Signed)
Monitor reviewed.  No significant arrhythmias.

## 2018-11-11 NOTE — Telephone Encounter (Signed)
   Primary Cardiologist: Minus Breeding, MD  Chart reviewed as part of pre-operative protocol coverage. Given past medical history and time since last visit, based on ACC/AHA guidelines, [name] would be at acceptable risk for the planned procedure without further cardiovascular testing.   I will route this recommendation to the requesting party via Epic fax function and remove from pre-op pool.  Please call with questions.  Abigail Butts, PA-C 11/11/2018, 7:34 AM

## 2018-11-18 NOTE — Progress Notes (Signed)
Cardiology Office Note   Date:  11/19/2018   ID:  [name], DOB 1970/10/28, MRN [id number]  PCP:  Eartha InchBadger, Michael C, MD  Cardiologist:   Rollene RotundaJames Ramesh Moan, MD Referring:  Eartha InchBadger, Michael C, MD   Chief Complaint  Patient presents with  . Palpitations     History of Present Illness: [name] is a 48 y.o. female who was referred by Eartha InchBadger, Michael C, MD for evaluation of tachycardia.   I saw her last month for this.  After that visit she was in the ED for chest pain.  I reviewed these records for this visit.    She had no evidence of ischemia and this was thought to be atypical.  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  She is going to have surgery for this.  She is also getting get a panniculectomy and abdominal hernia repairs.  She is not having significant tachypalpitations any longer.  She will monitor and she did have brief runs of SVT the most was 6 beats.  She had a couple episodes of Mobitz type I but she had no sustained arrhythmias.  She was having some chest pain previously but she thinks it was because it was taken off of her pneumonia resolved.  She is currently not having any chest pain.  She is not having any new shortness of breath, PND or orthopnea.   Past Medical History:  Diagnosis Date  . Anxiety   . Arthritis    joints  . Asthma   . Endometriosis   . Fat necrosis of breast    right  . GERD (gastroesophageal reflux disease)   . History of hiatal hernia    s/p repair few times  . History of suicide attempt 03/2017  . Hypertension   . MDD (major depressive disorder)   . Panniculitis   . PONV (postoperative nausea and vomiting)   . Psoriasis    treated with Humeria    Past Surgical History:  Procedure Laterality Date  . ACHILLES TENDON REPAIR Left 01-11-2015  @[facility]   . BREAST REDUCTION SURGERY Bilateral 01/09/2016   Procedure: MAMMARY REDUCTION  (BREAST) BILATERAL;  Surgeon: Peggye Formlaire S Dillingham, DO;  Location: [facility];  Service: Plastics;  Laterality: Bilateral;  . CARPOMETACARPAL (CMC) FUSION OF THUMB Right 05-04-2015   @[facility]   . COMBINED HYSTEROSCOPY DIAGNOSTIC / D&C  06-23-2003     dr Ambrose Mantlehenley @[facility]   . D & C HYSTERSCOPY /  DX LAPAROSCOPY CONVERSION LAPAROTOMY LYSIS ADHESIONS WITH RIGHT SALPINECTOMY  12-26-2003    dr Edward Jollysilva  @[facility]   . LAPAROSCOPIC CHOLECYSTECTOMY  10-19-2002   dr Orson Slickbowman  @[facility]   . LAPAROSCOPIC GASTRIC BANDING  09-25-2003  @[facility]    WITH HIATAL HERNIA REPAIR  . LAPAROSCOPIC REPAIR AND REMOVAL OF GASTRIC BAND  02-06-2006  @[facility]   . LAPAROTOMY LYSIS ADHESIONS , UTERINE BIOPSY  01-31-2000   dr Rana Snarelowe  @[facility]   . OPEN WOUND DEBRIDEMENT ANTERIOR ABDOMINAL WALL , REMOVAL FORGEIN BODY'S  05-10-2001   dr Orson Slickbowman  @[facility]   . REPAIR RECURRENT  HIATAL HERNIA   04-23-2004  @[facility]   . TAKEDOWN RECURRENT HIATAL HERNIA FROM PREVIOUS MESH REPAIR OF DIAPHRAGM/ REPAIR HIATAL HERNIA AND THE GASTROPEXY  11/05/2004  . TONSILLECTOMY  age 89  . TOTAL ABDOMINAL HYSTERECTOMY W/ BILATERAL SALPINGOOPHORECTOMY  12-23-2005   dr soper   W/  BILATERAL URETEROLYSIS AND EXTENSIVE ABD. LYSIS ADHESIONS  . UMBILICAL HERNIA REPAIR  12/27/2009   LAPAROSCOPY TAKEDOWN INCARCERATED UMBILICAL HERNIA WITH REPAIR/  OPEN REPAIR COMPLEX LOWER INCISIONAL HERNIA     Current Outpatient Medications  Medication Sig Dispense Refill  . acetaminophen (TYLENOL) 500 MG tablet Take 1,000 mg by mouth every 4 (four) hours as needed for fever or headache.    . albuterol (PROVENTIL HFA;VENTOLIN HFA) 108 (90 BASE) MCG/ACT inhaler Inhale 2 puffs into the lungs every 6 (six) hours as needed. For shortness of breath    . ALPRAZolam (XANAX) 0.5 MG tablet Take 0.5 mg by mouth 3 (three) times daily as needed for anxiety.     Marland Kitchen. azelastine (ASTELIN) 137 MCG/SPRAY nasal spray Place 2 sprays into both nostrils 2 (two) times daily as needed for rhinitis. Use in each nostril as directed    . b complex vitamins tablet Take 1 tablet by mouth daily.    . Biotin [id number] MCG TABS Take  10,000 mcg by mouth daily.    Marland Kitchen. buPROPion (WELLBUTRIN XL) 300 MG 24 hr tablet Take 300 mg by mouth daily.    . cetirizine (ZYRTEC) 10 MG tablet Take 10 mg by mouth daily as needed for allergies.     Marland Kitchen. HYDROcodone-acetaminophen (NORCO) 5-325 MG tablet Take 1 tablet by mouth every 6 (six) hours as needed for moderate pain. 30 tablet 0  . ipratropium-albuterol (DUONEB) 0.5-2.5 (3) MG/3ML SOLN Take 3 mLs by nebulization every 6 (six) hours as needed. 360 mL 0  . mirtazapine (REMERON) 15 MG tablet Take 1 tablet (15 mg total) by mouth at bedtime. For mood control 30 tablet 0  . montelukast (SINGULAIR) 10 MG tablet Take 10 mg by mouth at bedtime.     . Multiple Vitamin (MULTIVITAMIN) tablet Take 1 tablet by mouth daily.    Marland Kitchen. olmesartan (BENICAR) 40 MG tablet Take 40 mg by mouth every morning.     . pantoprazole (PROTONIX) 40 MG tablet Take 40 mg by mouth 2 (two) times daily.     . polyethylene glycol (MIRALAX / GLYCOLAX) 17 g packet Take 17 g by mouth daily as needed for mild constipation. 14 each 0  . promethazine (PHENERGAN) 25 MG tablet Take 25 mg by mouth every 8 (eight) hours as needed for nausea or vomiting.     . traZODone (DESYREL) 50 MG tablet Take 1 tablet (50 mg total) by mouth at bedtime as needed for sleep. For sleep (Patient taking differently: Take 50 mg by mouth at bedtime. For sleep) 30 tablet 0  . venlafaxine XR (EFFEXOR-XR) 150 MG 24 hr capsule Take 1 capsule by mouth daily with breakfast.     . vitamin A 8000 UNIT capsule Take 8,000 Units by mouth daily.    . vitamin B-12 (CYANOCOBALAMIN) 1000 MCG tablet Take 1,000 mcg by mouth daily.     No current facility-administered medications for this visit.     Allergies:   Influenza vaccines and Lisinopril-hydrochlorothiazide    ROS:  Please see the history of present illness.   Otherwise, review of systems are positive for none.   All other systems are reviewed and negative.    PHYSICAL EXAM: VS:  BP 136/90   Pulse (!) 112   Temp  (!) 97.2 F (36.2 C)   Ht 5\' 11"  (1.803 m)   Wt (!) 350 lb (158.8 kg)   SpO2 92%   BMI 48.82 kg/m  , BMI Body mass index is 48.82 kg/m. GENERAL:  Well appearing LYMPHATICS:  No cervical, inguinal adenopathy LUNGS:  Clear to auscultation bilaterally BACK:  No CVA tenderness CHEST:  Unremarkable HEART:  PMI  not displaced or sustained,S1 and S2 within normal limits, no S3, no S4, no clicks, no rubs, no murmurs ABD:  Flat, positive bowel sounds normal in frequency in pitch, no bruits, no rebound, no guarding, no midline pulsatile mass, no hepatomegaly, no splenomegaly EXT:  2 plus pulses throughout, no edema, no cyanosis no clubbing   EKG:  EKG is not ordered today. The ekg ordered 11/05/18 demonstrates sinus tachycardia, rate 92, axis within normal limits, intervals within normal limits, no acute ST-T wave changes.   Recent Labs: 09/25/2018: Magnesium 2.2 10/18/2018: TSH 1.750 11/05/2018: ALT 23; BUN 15; Creatinine, Ser 0.92; Hemoglobin 15.2; Platelets 282; Potassium 3.9; Sodium 140    Lipid Panel No results found for: CHOL, TRIG, HDL, CHOLHDL, VLDL, LDLCALC, LDLDIRECT    Wt Readings from Last 3 Encounters:  11/19/18 (!) 350 lb (158.8 kg)  10/18/18 (!) 342 lb (155.1 kg)  09/24/18 (!) 345 lb 9.6 oz (156.8 kg)      Other studies Reviewed: Additional studies/ records that were reviewed today include:  ED records Review of the above records demonstrates:  Please see elsewhere in the note.     ASSESSMENT AND PLAN:  TACHYCARDIA :    The patient had normal TSH and T4.  She had no significant dysrhythmias on the monitor.  She is not having severe symptoms related to this.  At this point no further cardiac testing is suggested.  She does have problems in the future we could consider adding a beta-blocker.  PREOP: At this point patient has surgery in the next few months she would be at acceptable risk from a cardiovascular standpoint without further cardiovascular testing.  CHEST  DISCOMFORT:   History is atypical.  Seems to be resolving.  May have been associated pneumonia and possibly her GI issues.  No further work-up is suggested.   Current medicines are reviewed at length with the patient today.  The patient does not have concerns regarding medicines.  The following changes have been made:  None  Labs/ tests ordered today include: None  No orders of the defined types were placed in this encounter.    Disposition:   FU with me as needed.                                                                         Signed, Minus Breeding, MD  11/19/2018 4:31 PM    [facility]

## 2018-11-19 ENCOUNTER — Ambulatory Visit (INDEPENDENT_AMBULATORY_CARE_PROVIDER_SITE_OTHER): Payer: Medicare HMO | Admitting: Cardiology

## 2018-11-19 ENCOUNTER — Encounter: Payer: Self-pay | Admitting: Cardiology

## 2018-11-19 ENCOUNTER — Other Ambulatory Visit: Payer: Self-pay

## 2018-11-19 VITALS — BP 136/90 | HR 112 | Temp 97.2°F | Ht 71.0 in | Wt 350.0 lb

## 2018-11-19 DIAGNOSIS — R0789 Other chest pain: Secondary | ICD-10-CM

## 2018-11-19 DIAGNOSIS — R000 Tachycardia, unspecified: Secondary | ICD-10-CM

## 2018-11-19 DIAGNOSIS — R079 Chest pain, unspecified: Secondary | ICD-10-CM

## 2018-11-19 NOTE — Patient Instructions (Signed)
Medication Instructions:  Your physician recommends that you continue on your current medications as directed. Please refer to the Current Medication list given to you today.  If you need a refill on your cardiac medications before your next appointment, please call your pharmacy.   Lab work: NONE  Testing/Procedures: NONE  Follow-Up: AS NEEDED   

## 2018-12-06 ENCOUNTER — Other Ambulatory Visit (HOSPITAL_COMMUNITY): Payer: Medicare HMO

## 2018-12-10 ENCOUNTER — Ambulatory Visit: Admit: 2018-12-10 | Payer: Medicare HMO | Admitting: Surgery

## 2018-12-10 SURGERY — REPAIR, HERNIA, VENTRAL, LAPAROSCOPY-ASSISTED
Anesthesia: General

## 2019-01-10 ENCOUNTER — Telehealth: Payer: Self-pay

## 2019-01-10 NOTE — Telephone Encounter (Signed)

## 2019-01-11 ENCOUNTER — Other Ambulatory Visit: Payer: Self-pay

## 2019-01-11 ENCOUNTER — Ambulatory Visit (INDEPENDENT_AMBULATORY_CARE_PROVIDER_SITE_OTHER): Payer: Medicare HMO | Admitting: Surgical

## 2019-01-11 ENCOUNTER — Encounter: Payer: Self-pay | Admitting: Surgical

## 2019-01-11 VITALS — BP 113/83 | HR 76 | Temp 97.9°F | Ht 71.0 in | Wt 348.0 lb

## 2019-01-11 DIAGNOSIS — M793 Panniculitis, unspecified: Secondary | ICD-10-CM

## 2019-01-11 DIAGNOSIS — N641 Fat necrosis of breast: Secondary | ICD-10-CM

## 2019-01-11 MED ORDER — HYDROCODONE-ACETAMINOPHEN 5-325 MG PO TABS: 1.0000 | ORAL_TABLET | Freq: Four times a day (QID) | ORAL | 0 refills | Status: AC | PRN

## 2019-01-11 MED ORDER — CEPHALEXIN 500 MG PO CAPS: 500.0000 mg | ORAL_CAPSULE | Freq: Two times a day (BID) | ORAL | 0 refills | Status: AC

## 2019-01-11 MED ORDER — ONDANSETRON HCL 4 MG PO TABS: 4.0000 mg | ORAL_TABLET | Freq: Three times a day (TID) | ORAL | 0 refills | Status: DC | PRN

## 2019-01-11 NOTE — H&P (View-Only) (Signed)
Patient ID: [name], female    DOB: 23-Jul-1970, 48 y.o.   MRN: [id number]  Chief Complaint  Patient presents with  . Pre-op Exam      ICD-10-CM   1. Panniculitis  M79.3   2. Breast, fat necrosis  N64.1      History of Present Illness: [name] is a 48 y.o.  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 She presents for preoperative evaluation for upcoming procedure, panniculectomy, excision of right breast fat necrosis by Dr. Ulice Boldillingham.  She is also undergoing laparoscopic assisted ventral hernia repair by Dr. Daphine DeutscherMartin, scheduled for 01/24/2019  The patient has not had problems with anesthesia.  She denies any recent illnesses.  She was in the emergency room 2 months ago for chest pain which has been evaluated by cardiology and was found to be benign.  Cardiology has provided clearance for upcoming surgery.  No history of MI/TIA/stroke  She has a medical history significant for asthma, hypertension, GERD.  She does not take any blood thinners.  She has no history of DVT/PE or family history of DVT/PE.  She is scheduled for preop with Dr. Daphine DeutscherMartin in the next week or so.  She is also scheduled for mammogram on 01/14/2019.  She reports that her asthma is well controlled as well as her hypertension.  With no issues.  She has a history of reaction to sutures.  It has not prevented healing in the past, but has delayed healing.  She understands the risks and is okay with moving forward.  She also mentioned a "dog ear" area at the midline of her breasts that she would like revised at the time of surgery.  Past Medical History: Allergies: Allergies  Allergen Reactions  . Influenza Vaccines     States her "arm had a knot and turned red"  . Lisinopril-Hydrochlorothiazide     welts    Current Medications:  Current Outpatient Medications:  .  acetaminophen (TYLENOL) 500 MG tablet, Take 1,000 mg by mouth every 4  (four) hours as needed for fever or headache., Disp: , Rfl:  .  albuterol (PROVENTIL HFA;VENTOLIN HFA) 108 (90 BASE) MCG/ACT inhaler, Inhale 2 puffs into the lungs every 6 (six) hours as needed. For shortness of breath, Disp: , Rfl:  .  ALPRAZolam (XANAX) 0.5 MG tablet, Take 0.5 mg by mouth 3 (three) times daily as needed for anxiety. , Disp: , Rfl:  .  azelastine (ASTELIN) 137 MCG/SPRAY nasal spray, Place 2 sprays into both nostrils 2 (two) times daily as needed for rhinitis. Use in each nostril as directed, Disp: , Rfl:  .  b complex vitamins tablet, Take 1 tablet by mouth daily., Disp: , Rfl:  .  Biotin [id number] MCG TABS, Take 10,000 mcg by mouth daily., Disp: , Rfl:  .  buPROPion (WELLBUTRIN XL) 300 MG 24 hr tablet, Take 300 mg by mouth daily., Disp: , Rfl:  .  cetirizine (ZYRTEC) 10 MG tablet, Take 10 mg by mouth daily as needed for allergies. , Disp: , Rfl:  .  HYDROcodone-acetaminophen (NORCO) 5-325 MG tablet, Take 1 tablet by mouth every 6 (six) hours as needed for moderate pain., Disp: 30 tablet, Rfl: 0 .  ipratropium-albuterol (DUONEB) 0.5-2.5 (3) MG/3ML SOLN, Take 3 mLs by nebulization every 6 (six) hours as needed., Disp: 360 mL, Rfl: 0 .  mirtazapine (REMERON) 15 MG tablet, Take 1 tablet (15 mg total) by  mouth at bedtime. For mood control, Disp: 30 tablet, Rfl: 0 .  montelukast (SINGULAIR) 10 MG tablet, Take 10 mg by mouth at bedtime. , Disp: , Rfl:  .  Multiple Vitamin (MULTIVITAMIN) tablet, Take 1 tablet by mouth daily., Disp: , Rfl:  .  olmesartan (BENICAR) 40 MG tablet, Take 40 mg by mouth every morning. , Disp: , Rfl:  .  pantoprazole (PROTONIX) 40 MG tablet, Take 40 mg by mouth 2 (two) times daily. , Disp: , Rfl:  .  polyethylene glycol (MIRALAX / GLYCOLAX) 17 g packet, Take 17 g by mouth daily as needed for mild constipation., Disp: 14 each, Rfl: 0 .  promethazine (PHENERGAN) 25 MG tablet, Take 25 mg by mouth every 8 (eight) hours as needed for nausea or vomiting. , Disp: , Rfl:  .   traZODone (DESYREL) 50 MG tablet, Take 1 tablet (50 mg total) by mouth at bedtime as needed for sleep. For sleep (Patient taking differently: Take 50 mg by mouth at bedtime. For sleep), Disp: 30 tablet, Rfl: 0 .  venlafaxine XR (EFFEXOR-XR) 150 MG 24 hr capsule, Take 1 capsule by mouth daily with breakfast. , Disp: , Rfl:  .  vitamin A 8000 UNIT capsule, Take 8,000 Units by mouth daily., Disp: , Rfl:  .  vitamin B-12 (CYANOCOBALAMIN) 1000 MCG tablet, Take 1,000 mcg by mouth daily., Disp: , Rfl:   Past Medical Problems: Past Medical History:  Diagnosis Date  . Anxiety   . Arthritis    joints  . Asthma   . Endometriosis   . Fat necrosis of breast    right  . GERD (gastroesophageal reflux disease)   . History of hiatal hernia    s/p repair few times  . History of suicide attempt 03/2017  . Hypertension   . MDD (major depressive disorder)   . Panniculitis   . PONV (postoperative nausea and vomiting)   . Psoriasis    treated with Humeria    Past Surgical History: Past Surgical History:  Procedure Laterality Date  . ACHILLES TENDON REPAIR Left 01-11-2015  @[facility]   . BREAST REDUCTION SURGERY Bilateral 01/09/2016   Procedure: MAMMARY REDUCTION  (BREAST) BILATERAL;  Surgeon: Wallace Going, DO;  Location: [facility];  Service: Plastics;  Laterality: Bilateral;  . CARPOMETACARPAL (CMC) FUSION OF THUMB Right 05-04-2015   @[facility]   . COMBINED HYSTEROSCOPY DIAGNOSTIC / D&C  06-23-2003     dr Ulanda Edison @[facility]   . D & C HYSTERSCOPY /  DX LAPAROSCOPY CONVERSION LAPAROTOMY LYSIS ADHESIONS WITH RIGHT SALPINECTOMY  12-26-2003    dr Quincy Simmonds  @[facility]   . LAPAROSCOPIC CHOLECYSTECTOMY  10-19-2002   dr Deon Pilling  @[facility]   . LAPAROSCOPIC GASTRIC BANDING  09-25-2003  @[facility]    WITH HIATAL HERNIA REPAIR  . LAPAROSCOPIC REPAIR AND REMOVAL OF GASTRIC BAND  02-06-2006  @[facility]   . LAPAROTOMY LYSIS ADHESIONS , UTERINE BIOPSY  01-31-2000   dr Corinna Capra  @[facility]   . OPEN WOUND DEBRIDEMENT ANTERIOR ABDOMINAL WALL , REMOVAL FORGEIN  BODY'S  05-10-2001   dr Deon Pilling  @[facility]   . REPAIR RECURRENT  HIATAL HERNIA   04-23-2004  @[facility]   . TAKEDOWN RECURRENT HIATAL HERNIA FROM PREVIOUS MESH REPAIR OF DIAPHRAGM/ REPAIR HIATAL HERNIA AND THE GASTROPEXY  11/05/2004  . TONSILLECTOMY  age 16  . TOTAL ABDOMINAL HYSTERECTOMY W/ BILATERAL SALPINGOOPHORECTOMY  12-23-2005   dr soper   W/  BILATERAL URETEROLYSIS AND EXTENSIVE ABD. LYSIS ADHESIONS  . UMBILICAL HERNIA REPAIR  12/27/2009   LAPAROSCOPY TAKEDOWN INCARCERATED UMBILICAL HERNIA WITH  REPAIR/ OPEN REPAIR COMPLEX LOWER INCISIONAL HERNIA    Social History: Social History   Socioeconomic History  . Marital status: Legally Separated    Spouse name: Not on file  . Number of children: Not on file  . Years of education: Not on file  . Highest education level: Not on file  Occupational History  . Not on file  Social Needs  . Financial resource strain: Not on file  . Food insecurity    Worry: Not on file    Inability: Not on file  . Transportation needs    Medical: Not on file    Non-medical: Not on file  Tobacco Use  . Smoking status: Former Smoker    Packs/day: 1.00    Years: 20.00    Pack years: 20.00    Types: Cigarettes    Quit date: 05/20/2015    Years since quitting: 3.6  . Smokeless tobacco: Never Used  Substance and Sexual Activity  . Alcohol use: No  . Drug use: Never  . Sexual activity: Yes    Birth control/protection: Surgical  Lifestyle  . Physical activity    Days per week: Not on file    Minutes per session: Not on file  . Stress: Not on file  Relationships  . Social Musician on phone: Not on file    Gets together: Not on file    Attends religious service: Not on file    Active member of club or organization: Not on file    Attends meetings of clubs or organizations: Not on file    Relationship status: Not on file  . Intimate partner violence    Fear of current or ex partner: Not on file    Emotionally abused: Not on file    Physically  abused: Not on file    Forced sexual activity: Not on file  Other Topics Concern  . Not on file  Social History Narrative   Mother lives with her.        Family History: Family History  Problem Relation Age of Onset  . Cancer Maternal Aunt        melanoma  . Diabetes Father   . Heart attack Father 39    Review of Systems: Review of Systems  Constitutional: Negative for chills, diaphoresis, fever, malaise/fatigue and weight loss.  Eyes: Negative.   Respiratory: Negative for cough, sputum production, shortness of breath and wheezing.   Cardiovascular: Negative for chest pain, palpitations, claudication, leg swelling and PND.  Gastrointestinal: Positive for abdominal pain. Negative for blood in stool, diarrhea, nausea and vomiting.  Genitourinary: Negative.   Musculoskeletal: Negative.   Skin: Positive for rash. Negative for itching.  Neurological: Negative for dizziness and headaches.    Physical Exam: Vital Signs BP 113/83 (BP Location: Left Arm, Patient Position: Sitting, Cuff Size: Large)   Pulse 76   Temp 97.9 F (36.6 C) (Temporal)   Ht 5\' 11"  (1.803 m)   Wt (!) 348 lb (157.9 kg)   SpO2 100%   BMI 48.54 kg/m  Chaperone present Physical Exam Constitutional:      General: She is not in acute distress.    Appearance: She is obese. She is not ill-appearing.  HENT:     Head: Normocephalic and atraumatic.  Eyes:     Conjunctiva/sclera: Conjunctivae normal.  Neck:     Musculoskeletal: Normal range of motion and neck supple. No neck rigidity or muscular tenderness.  Cardiovascular:     Rate  and Rhythm: Normal rate and regular rhythm.     Pulses: Normal pulses.     Heart sounds: Normal heart sounds.  Pulmonary:     Effort: Pulmonary effort is normal. No respiratory distress.     Breath sounds: Normal breath sounds. No wheezing.  Chest:     Chest wall: No tenderness.  Abdominal:     General: Bowel sounds are normal. There is no distension.     Palpations:  Abdomen is soft. There is no mass.     Tenderness: There is no guarding.     Comments: Significant pannus  Musculoskeletal: Normal range of motion.        General: No swelling, tenderness or deformity.  Lymphadenopathy:     Cervical: No cervical adenopathy.  Skin:    General: Skin is warm and dry.     Capillary Refill: Capillary refill takes less than 2 seconds.     Coloration: Skin is not jaundiced.  Neurological:     General: No focal deficit present.     Mental Status: She is alert and oriented to person, place, and time. Mental status is at baseline.     Motor: No weakness.  Psychiatric:        Mood and Affect: Mood normal.        Behavior: Behavior normal.    Assessment/Plan: Ms Baise is scheduled for panniculectomy, excision of right breast fat necrosis by Dr. Dillingham.  She is also undergoing laparoscopic assisted ventral hernia repair by Dr. Martin, scheduled for 01/24/2019    Prescription sent to pharmacy. Patient planning to spend at least 1 night in the hospital.  She is scheduled for preop evaluation with Dr. Martin in the next week or so.  Caprini score greater than 5, high risk, 6% without prophylaxis.  Recommend SCDs, heparin while in hospital.  She is also going to get her mammogram on 01/14/2019.  Results to be sent to this office.  The consent was obtained with risks and complications reviewed which included bleeding, pain, scar, infection and the risk of anesthesia.  The patients questions were answered to the patients expressed satisfaction.  Electronically signed by: Jaidyn Usery J Cartha Rotert, PA-C 01/11/2019 1:50 PM  

## 2019-01-11 NOTE — Progress Notes (Signed)
Patient ID: [name], female    DOB: 23-Jul-1970, 48 y.o.   MRN: [id number]  Chief Complaint  Patient presents with  . Pre-op Exam      ICD-10-CM   1. Panniculitis  M79.3   2. Breast, fat necrosis  N64.1      History of Present Illness: [name] is a 48 y.o.  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 She presents for preoperative evaluation for upcoming procedure, panniculectomy, excision of right breast fat necrosis by Dr. Ulice Boldillingham.  She is also undergoing laparoscopic assisted ventral hernia repair by Dr. Daphine DeutscherMartin, scheduled for 01/24/2019  The patient has not had problems with anesthesia.  She denies any recent illnesses.  She was in the emergency room 2 months ago for chest pain which has been evaluated by cardiology and was found to be benign.  Cardiology has provided clearance for upcoming surgery.  No history of MI/TIA/stroke  She has a medical history significant for asthma, hypertension, GERD.  She does not take any blood thinners.  She has no history of DVT/PE or family history of DVT/PE.  She is scheduled for preop with Dr. Daphine DeutscherMartin in the next week or so.  She is also scheduled for mammogram on 01/14/2019.  She reports that her asthma is well controlled as well as her hypertension.  With no issues.  She has a history of reaction to sutures.  It has not prevented healing in the past, but has delayed healing.  She understands the risks and is okay with moving forward.  She also mentioned a "dog ear" area at the midline of her breasts that she would like revised at the time of surgery.  Past Medical History: Allergies: Allergies  Allergen Reactions  . Influenza Vaccines     States her "arm had a knot and turned red"  . Lisinopril-Hydrochlorothiazide     welts    Current Medications:  Current Outpatient Medications:  .  acetaminophen (TYLENOL) 500 MG tablet, Take 1,000 mg by mouth every 4  (four) hours as needed for fever or headache., Disp: , Rfl:  .  albuterol (PROVENTIL HFA;VENTOLIN HFA) 108 (90 BASE) MCG/ACT inhaler, Inhale 2 puffs into the lungs every 6 (six) hours as needed. For shortness of breath, Disp: , Rfl:  .  ALPRAZolam (XANAX) 0.5 MG tablet, Take 0.5 mg by mouth 3 (three) times daily as needed for anxiety. , Disp: , Rfl:  .  azelastine (ASTELIN) 137 MCG/SPRAY nasal spray, Place 2 sprays into both nostrils 2 (two) times daily as needed for rhinitis. Use in each nostril as directed, Disp: , Rfl:  .  b complex vitamins tablet, Take 1 tablet by mouth daily., Disp: , Rfl:  .  Biotin [id number] MCG TABS, Take 10,000 mcg by mouth daily., Disp: , Rfl:  .  buPROPion (WELLBUTRIN XL) 300 MG 24 hr tablet, Take 300 mg by mouth daily., Disp: , Rfl:  .  cetirizine (ZYRTEC) 10 MG tablet, Take 10 mg by mouth daily as needed for allergies. , Disp: , Rfl:  .  HYDROcodone-acetaminophen (NORCO) 5-325 MG tablet, Take 1 tablet by mouth every 6 (six) hours as needed for moderate pain., Disp: 30 tablet, Rfl: 0 .  ipratropium-albuterol (DUONEB) 0.5-2.5 (3) MG/3ML SOLN, Take 3 mLs by nebulization every 6 (six) hours as needed., Disp: 360 mL, Rfl: 0 .  mirtazapine (REMERON) 15 MG tablet, Take 1 tablet (15 mg total) by  mouth at bedtime. For mood control, Disp: 30 tablet, Rfl: 0 .  montelukast (SINGULAIR) 10 MG tablet, Take 10 mg by mouth at bedtime. , Disp: , Rfl:  .  Multiple Vitamin (MULTIVITAMIN) tablet, Take 1 tablet by mouth daily., Disp: , Rfl:  .  olmesartan (BENICAR) 40 MG tablet, Take 40 mg by mouth every morning. , Disp: , Rfl:  .  pantoprazole (PROTONIX) 40 MG tablet, Take 40 mg by mouth 2 (two) times daily. , Disp: , Rfl:  .  polyethylene glycol (MIRALAX / GLYCOLAX) 17 g packet, Take 17 g by mouth daily as needed for mild constipation., Disp: 14 each, Rfl: 0 .  promethazine (PHENERGAN) 25 MG tablet, Take 25 mg by mouth every 8 (eight) hours as needed for nausea or vomiting. , Disp: , Rfl:  .   traZODone (DESYREL) 50 MG tablet, Take 1 tablet (50 mg total) by mouth at bedtime as needed for sleep. For sleep (Patient taking differently: Take 50 mg by mouth at bedtime. For sleep), Disp: 30 tablet, Rfl: 0 .  venlafaxine XR (EFFEXOR-XR) 150 MG 24 hr capsule, Take 1 capsule by mouth daily with breakfast. , Disp: , Rfl:  .  vitamin A 8000 UNIT capsule, Take 8,000 Units by mouth daily., Disp: , Rfl:  .  vitamin B-12 (CYANOCOBALAMIN) 1000 MCG tablet, Take 1,000 mcg by mouth daily., Disp: , Rfl:   Past Medical Problems: Past Medical History:  Diagnosis Date  . Anxiety   . Arthritis    joints  . Asthma   . Endometriosis   . Fat necrosis of breast    right  . GERD (gastroesophageal reflux disease)   . History of hiatal hernia    s/p repair few times  . History of suicide attempt 03/2017  . Hypertension   . MDD (major depressive disorder)   . Panniculitis   . PONV (postoperative nausea and vomiting)   . Psoriasis    treated with Humeria    Past Surgical History: Past Surgical History:  Procedure Laterality Date  . ACHILLES TENDON REPAIR Left 01-11-2015  @[facility]   . BREAST REDUCTION SURGERY Bilateral 01/09/2016   Procedure: MAMMARY REDUCTION  (BREAST) BILATERAL;  Surgeon: Wallace Going, DO;  Location: [facility];  Service: Plastics;  Laterality: Bilateral;  . CARPOMETACARPAL (CMC) FUSION OF THUMB Right 05-04-2015   @[facility]   . COMBINED HYSTEROSCOPY DIAGNOSTIC / D&C  06-23-2003     dr Ulanda Edison @[facility]   . D & C HYSTERSCOPY /  DX LAPAROSCOPY CONVERSION LAPAROTOMY LYSIS ADHESIONS WITH RIGHT SALPINECTOMY  12-26-2003    dr Quincy Simmonds  @[facility]   . LAPAROSCOPIC CHOLECYSTECTOMY  10-19-2002   dr Deon Pilling  @[facility]   . LAPAROSCOPIC GASTRIC BANDING  09-25-2003  @[facility]    WITH HIATAL HERNIA REPAIR  . LAPAROSCOPIC REPAIR AND REMOVAL OF GASTRIC BAND  02-06-2006  @[facility]   . LAPAROTOMY LYSIS ADHESIONS , UTERINE BIOPSY  01-31-2000   dr Corinna Capra  @[facility]   . OPEN WOUND DEBRIDEMENT ANTERIOR ABDOMINAL WALL , REMOVAL FORGEIN  BODY'S  05-10-2001   dr Deon Pilling  @[facility]   . REPAIR RECURRENT  HIATAL HERNIA   04-23-2004  @[facility]   . TAKEDOWN RECURRENT HIATAL HERNIA FROM PREVIOUS MESH REPAIR OF DIAPHRAGM/ REPAIR HIATAL HERNIA AND THE GASTROPEXY  11/05/2004  . TONSILLECTOMY  age 16  . TOTAL ABDOMINAL HYSTERECTOMY W/ BILATERAL SALPINGOOPHORECTOMY  12-23-2005   dr soper   W/  BILATERAL URETEROLYSIS AND EXTENSIVE ABD. LYSIS ADHESIONS  . UMBILICAL HERNIA REPAIR  12/27/2009   LAPAROSCOPY TAKEDOWN INCARCERATED UMBILICAL HERNIA WITH  REPAIR/ OPEN REPAIR COMPLEX LOWER INCISIONAL HERNIA    Social History: Social History   Socioeconomic History  . Marital status: Legally Separated    Spouse name: Not on file  . Number of children: Not on file  . Years of education: Not on file  . Highest education level: Not on file  Occupational History  . Not on file  Social Needs  . Financial resource strain: Not on file  . Food insecurity    Worry: Not on file    Inability: Not on file  . Transportation needs    Medical: Not on file    Non-medical: Not on file  Tobacco Use  . Smoking status: Former Smoker    Packs/day: 1.00    Years: 20.00    Pack years: 20.00    Types: Cigarettes    Quit date: 05/20/2015    Years since quitting: 3.6  . Smokeless tobacco: Never Used  Substance and Sexual Activity  . Alcohol use: No  . Drug use: Never  . Sexual activity: Yes    Birth control/protection: Surgical  Lifestyle  . Physical activity    Days per week: Not on file    Minutes per session: Not on file  . Stress: Not on file  Relationships  . Social Musician on phone: Not on file    Gets together: Not on file    Attends religious service: Not on file    Active member of club or organization: Not on file    Attends meetings of clubs or organizations: Not on file    Relationship status: Not on file  . Intimate partner violence    Fear of current or ex partner: Not on file    Emotionally abused: Not on file    Physically  abused: Not on file    Forced sexual activity: Not on file  Other Topics Concern  . Not on file  Social History Narrative   Mother lives with her.        Family History: Family History  Problem Relation Age of Onset  . Cancer Maternal Aunt        melanoma  . Diabetes Father   . Heart attack Father 39    Review of Systems: Review of Systems  Constitutional: Negative for chills, diaphoresis, fever, malaise/fatigue and weight loss.  Eyes: Negative.   Respiratory: Negative for cough, sputum production, shortness of breath and wheezing.   Cardiovascular: Negative for chest pain, palpitations, claudication, leg swelling and PND.  Gastrointestinal: Positive for abdominal pain. Negative for blood in stool, diarrhea, nausea and vomiting.  Genitourinary: Negative.   Musculoskeletal: Negative.   Skin: Positive for rash. Negative for itching.  Neurological: Negative for dizziness and headaches.    Physical Exam: Vital Signs BP 113/83 (BP Location: Left Arm, Patient Position: Sitting, Cuff Size: Large)   Pulse 76   Temp 97.9 F (36.6 C) (Temporal)   Ht 5\' 11"  (1.803 m)   Wt (!) 348 lb (157.9 kg)   SpO2 100%   BMI 48.54 kg/m  Chaperone present Physical Exam Constitutional:      General: She is not in acute distress.    Appearance: She is obese. She is not ill-appearing.  HENT:     Head: Normocephalic and atraumatic.  Eyes:     Conjunctiva/sclera: Conjunctivae normal.  Neck:     Musculoskeletal: Normal range of motion and neck supple. No neck rigidity or muscular tenderness.  Cardiovascular:     Rate  and Rhythm: Normal rate and regular rhythm.     Pulses: Normal pulses.     Heart sounds: Normal heart sounds.  Pulmonary:     Effort: Pulmonary effort is normal. No respiratory distress.     Breath sounds: Normal breath sounds. No wheezing.  Chest:     Chest wall: No tenderness.  Abdominal:     General: Bowel sounds are normal. There is no distension.     Palpations:  Abdomen is soft. There is no mass.     Tenderness: There is no guarding.     Comments: Significant pannus  Musculoskeletal: Normal range of motion.        General: No swelling, tenderness or deformity.  Lymphadenopathy:     Cervical: No cervical adenopathy.  Skin:    General: Skin is warm and dry.     Capillary Refill: Capillary refill takes less than 2 seconds.     Coloration: Skin is not jaundiced.  Neurological:     General: No focal deficit present.     Mental Status: She is alert and oriented to person, place, and time. Mental status is at baseline.     Motor: No weakness.  Psychiatric:        Mood and Affect: Mood normal.        Behavior: Behavior normal.    Assessment/Plan: Ms Carrol is scheduled for panniculectomy, excision of right breast fat necrosis by Dr. Ulice Bold.  She is also undergoing laparoscopic assisted ventral hernia repair by [name], scheduled for 01/24/2019    Prescription sent to pharmacy. Patient planning to spend at least 1 night in the hospital.  She is scheduled for preop evaluation with [name] in the next week or so.  Caprini score greater than 5, high risk, 6% without prophylaxis.  Recommend SCDs, heparin while in hospital.  She is also going to get her mammogram on 01/14/2019.  Results to be sent to this office.  The consent was obtained with risks and complications reviewed which included bleeding, pain, scar, infection and the risk of anesthesia.  The patients questions were answered to the patients expressed satisfaction.  Electronically signed by: Kermit Balo Getsemani Lindon, PA-C 01/11/2019 1:50 PM

## 2019-01-19 NOTE — Patient Instructions (Addendum)
DUE TO COVID-19 ONLY ONE VISITOR IS ALLOWED TO COME WITH YOU AND STAY IN THE WAITING ROOM ONLY DURING PRE OP AND PROCEDURE DAY OF SURGERY. THE 1 VISITOR MAY VISIT WITH YOU AFTER SURGERY IN YOUR PRIVATE ROOM DURING VISITING HOURS ONLY!   ONCE YOUR COVID TEST IS COMPLETED, PLEASE BEGIN THE QUARANTINE INSTRUCTIONS AS OUTLINED IN YOUR HANDOUT.                [name]    Your procedure is scheduled on: Monday 01/24/2019   Report to [facility] Main  Entrance    Report to Short Stay at 5:30 AM     Call this number if you have problems the morning of surgery [phone]    Remember: Do not eat food or drink liquids after Midnight.     BRUSH YOUR TEETH MORNING OF SURGERY AND RINSE YOUR MOUTH OUT, NO CHEWING GUM CANDY OR MINTS.     Take these medicines the morning of surgery with A SIP OF WATER: Xanax, Albuterol if needed (bring to hospital with you), Wellbutrin, Venafaxine XR,  Zyrtec if needed, Astelin nasal spray (if needed), Protonix                                You may not have any metal on your body including hair pins and              piercings  Do not wear jewelry, make-up, lotions, powders or perfumes, deodorant             Do not wear nail polish on your fingernails.  Do not shave  48 hours prior to surgery.            Do not bring valuables to the hospital. [place].  Contacts, dentures or bridgework may not be worn into surgery.  Leave suitcase in the car. After surgery it may be brought to your room.                   Please read over the following fact sheets you were given: _____________________________________________________________________             [facility] - Preparing for Surgery Before surgery, you can play an important role.  Because skin is not sterile, your skin needs to be as free of germs as possible.  You can reduce the number of germs on your skin by washing with CHG  (chlorahexidine gluconate) soap before surgery.  CHG is an antiseptic cleaner which kills germs and bonds with the skin to continue killing germs even after washing. Please DO NOT use if you have an allergy to CHG or antibacterial soaps.  If your skin becomes reddened/irritated stop using the CHG and inform your nurse when you arrive at Short Stay. Do not shave (including legs and underarms) for at least 48 hours prior to the first CHG shower.  You may shave your face/neck. Please follow these instructions carefully:  1.  Shower with CHG Soap the night before surgery and the  morning of Surgery.  2.  If you choose to wash your hair, wash your hair first as usual with your  normal  shampoo.  3.  After you shampoo, rinse your hair and body thoroughly to remove the  shampoo.  4.  Use CHG as you would any other liquid soap.  You can apply chg directly  to the skin and wash                       Gently with a scrungie or clean washcloth.  5.  Apply the CHG Soap to your body ONLY FROM THE NECK DOWN.   Do not use on face/ open                           Wound or open sores. Avoid contact with eyes, ears mouth and genitals (private parts).                       Wash face,  Genitals (private parts) with your normal soap.             6.  Wash thoroughly, paying special attention to the area where your surgery  will be performed.  7.  Thoroughly rinse your body with warm water from the neck down.  8.  DO NOT shower/wash with your normal soap after using and rinsing off  the CHG Soap.                9.  Pat yourself dry with a clean towel.            10.  Wear clean pajamas.            11.  Place clean sheets on your bed the night of your first shower and do not  sleep with pets. Day of Surgery : Do not apply any lotions/deodorants the morning of surgery.  Please wear clean clothes to the hospital/surgery center.  FAILURE TO FOLLOW THESE INSTRUCTIONS MAY RESULT IN THE CANCELLATION  OF YOUR SURGERY PATIENT SIGNATURE_________________________________  NURSE SIGNATURE__________________________________  ________________________________________________________________________

## 2019-01-20 ENCOUNTER — Other Ambulatory Visit (HOSPITAL_COMMUNITY)
Admission: RE | Admit: 2019-01-20 | Discharge: 2019-01-20 | Disposition: A | Discharge status: Home / Self Care | Payer: Medicare HMO | Source: Ambulatory Visit | Attending: Surgery | Admitting: Surgery

## 2019-01-20 ENCOUNTER — Other Ambulatory Visit: Payer: Self-pay

## 2019-01-20 ENCOUNTER — Encounter (HOSPITAL_COMMUNITY): Payer: Self-pay

## 2019-01-20 ENCOUNTER — Encounter (HOSPITAL_COMMUNITY)
Admission: RE | Admit: 2019-01-20 | Discharge: 2019-01-20 | Disposition: A | Discharge status: Home / Self Care | Payer: Medicare HMO | Source: Ambulatory Visit | Attending: Surgery | Admitting: Surgery

## 2019-01-20 DIAGNOSIS — Z87891 Personal history of nicotine dependence: Secondary | ICD-10-CM | POA: Insufficient documentation

## 2019-01-20 DIAGNOSIS — I10 Essential (primary) hypertension: Secondary | ICD-10-CM | POA: Insufficient documentation

## 2019-01-20 DIAGNOSIS — Z79899 Other long term (current) drug therapy: Secondary | ICD-10-CM | POA: Diagnosis not present

## 2019-01-20 DIAGNOSIS — F419 Anxiety disorder, unspecified: Secondary | ICD-10-CM | POA: Diagnosis not present

## 2019-01-20 DIAGNOSIS — Z20828 Contact with and (suspected) exposure to other viral communicable diseases: Secondary | ICD-10-CM | POA: Insufficient documentation

## 2019-01-20 DIAGNOSIS — Z01818 Encounter for other preprocedural examination: Secondary | ICD-10-CM | POA: Insufficient documentation

## 2019-01-20 DIAGNOSIS — J45909 Unspecified asthma, uncomplicated: Secondary | ICD-10-CM | POA: Insufficient documentation

## 2019-01-20 DIAGNOSIS — M793 Panniculitis, unspecified: Secondary | ICD-10-CM | POA: Diagnosis not present

## 2019-01-20 DIAGNOSIS — K219 Gastro-esophageal reflux disease without esophagitis: Secondary | ICD-10-CM | POA: Insufficient documentation

## 2019-01-20 DIAGNOSIS — L409 Psoriasis, unspecified: Secondary | ICD-10-CM | POA: Diagnosis not present

## 2019-01-20 DIAGNOSIS — K439 Ventral hernia without obstruction or gangrene: Secondary | ICD-10-CM | POA: Insufficient documentation

## 2019-01-20 LAB — CBC
HCT: 46.8 % — ABNORMAL HIGH (ref 36.0–46.0)
Hemoglobin: 15 g/dL (ref 12.0–15.0)
MCH: 32.1 pg (ref 26.0–34.0)
MCHC: 32.1 g/dL (ref 30.0–36.0)
MCV: 100.2 fL — ABNORMAL HIGH (ref 80.0–100.0)
Platelets: 256 10*3/uL (ref 150–400)
RBC: 4.67 MIL/uL (ref 3.87–5.11)
RDW: 12.8 % (ref 11.5–15.5)
WBC: 12.8 10*3/uL — ABNORMAL HIGH (ref 4.0–10.5)
nRBC: 0 % (ref 0.0–0.2)

## 2019-01-20 LAB — BASIC METABOLIC PANEL
Anion gap: 9 (ref 5–15)
BUN: 19 mg/dL (ref 6–20)
CO2: 28 mmol/L (ref 22–32)
Calcium: 9.2 mg/dL (ref 8.9–10.3)
Chloride: 102 mmol/L (ref 98–111)
Creatinine, Ser: 0.96 mg/dL (ref 0.44–1.00)
GFR calc Af Amer: >60 mL/min (ref >60)
GFR calc non Af Amer: >60 mL/min (ref >60)
Glucose, Bld: 99 mg/dL (ref 70–99)
Potassium: 4.9 mmol/L (ref 3.5–5.1)
Sodium: 139 mmol/L (ref 135–145)

## 2019-01-20 NOTE — Progress Notes (Signed)
PCP -Dr. Anastasia Pall  Cardiologist - Dr. Minus Breeding  LOV 11/19/2018 in [place] in hospital in 10/2018 with pneumonia and EKG questioned Atrial Fibrillation. Patient wore holter monitor for 3 days-normal.  Chest x-ray - 11/05/2018 EKG - 11/05/2018 Stress Test - None ECHO - none Cardiac Cath - none  Sleep Study - none CPAP - none  Fasting Blood Sugar - none Checks Blood Sugar _____ times a day  Blood Thinner Instructions:none Aspirin Instructions:none Last Dose:  Anesthesia review:  Patient has history of HTN and asthma  Patient denies shortness of breath, fever, cough and chest pain at PAT appointment   Patient verbalized understanding of instructions that were given to them at the PAT appointment. Patient was also instructed that they will need to review over the PAT instructions again at home before surgery.

## 2019-01-21 NOTE — Anesthesia Preprocedure Evaluation (Addendum)
Anesthesia Evaluation  Patient identified by MRN, date of birth, ID band Patient awake    Reviewed: Allergy & Precautions, NPO status , Patient's Chart, lab work & pertinent test results  History of Anesthesia Complications (+) PONV and history of anesthetic complications  Airway Mallampati: II  TM Distance: >3 FB Neck ROM: Full    Dental no notable dental hx.    Pulmonary asthma , pneumonia, resolved, former smoker,    Pulmonary exam normal breath sounds clear to auscultation       Cardiovascular hypertension, Pt. on medications  Rhythm:Regular Rate:Tachycardia  ECG: NSR, rate 92   Neuro/Psych PSYCHIATRIC DISORDERS Anxiety Depression negative neurological ROS     GI/Hepatic Neg liver ROS, hiatal hernia, GERD  Medicated and Controlled,  Endo/Other  Morbid obesity  Renal/GU negative Renal ROS     Musculoskeletal negative musculoskeletal ROS (+)   Abdominal (+) + obese,   Peds  Hematology negative hematology ROS (+)   Anesthesia Other Findings PANNICULITIS VENTRAL HERNIA  Reproductive/Obstetrics                           Anesthesia Physical Anesthesia Plan  ASA: III  Anesthesia Plan: General   Post-op Pain Management:    Induction: Intravenous  PONV Risk Score and Plan: 4 or greater and Scopolamine patch - Pre-op, Midazolam, Dexamethasone, Ondansetron and Treatment may vary due to age or medical condition  Airway Management Planned: Oral ETT  Additional Equipment:   Intra-op Plan:   Post-operative Plan: Extubation in OR  Informed Consent: I have reviewed the patients History and Physical, chart, labs and discussed the procedure including the risks, benefits and alternatives for the proposed anesthesia with the patient or authorized representative who has indicated his/her understanding and acceptance.     Dental advisory given  Plan Discussed with: CRNA  Anesthesia Plan  Comments: (Reviewed PAT note 01/20/2019, Konrad Felix, PA-C)      Anesthesia Quick Evaluation

## 2019-01-21 NOTE — Progress Notes (Signed)
Anesthesia Chart Review   Case: [id number] Date/Time: 01/24/19 0715   Procedures:      LAPAROSCOPIC ASSISTED VENTRAL HERNIA REPAIR (N/A )     PANNICULECTOMY (N/A )     EXCISIN OF RIGHT BREAST FAT NECROSIS (Right )   Anesthesia type: General   Pre-op diagnosis: PANNICULITIS AND VENTRAL HERNIA   Location: [facility]   Surgeon: Johnathan Hausen, MD; Marla Roe [name], DO      DISCUSSION:48 y.o. former smoker (20 pack years, quit 05/20/15) with h/o PONV, HTN, asthma, GERD, hiatal hernia, panniculitis and ventral hernia scheduled for above procedure 01/24/2019 with Dr. Johnathan Hausen and Dr. Audelia Hives.    Pt evaluated by cardiologist, Dr. Minus Breeding for tachycardia, atypical chest pain.  Per last OV note 11/19/2018, "The patient had normal TSH and T4.  She had no significant dysrhythmias on the monitor.  She is not having severe symptoms related to this.  At this point no further cardiac testing is suggested.  She does have problems in the future we could consider adding a beta-blocker. PREOP: At this point patient has surgery in the next few months she would be at acceptable risk from a cardiovascular standpoint without further cardiovascular testing. CHEST DISCOMFORT:   History is atypical.  Seems to be resolving.  May have been associated pneumonia and possibly her GI issues.  No further work-up is suggested."  Anticipate pt can proceed with planned procedure barring acute status change.   VS: BP 127/84 (BP Location: Right Arm)   Pulse 86   Temp 36.8 C (Oral)   Resp 18   Ht 5\' 11"  (1.803 m)   Wt (!) 157.9 kg   SpO2 100%   BMI 48.54 kg/m   PROVIDERS: Chesley Noon, MD is PCP   Minus Breeding, MD is Cardiologist  LABS: Labs reviewed: Acceptable for surgery. (all labs ordered are listed, but only abnormal results are displayed)  Labs Reviewed  CBC - Abnormal; Notable for the following components:      Result Value   WBC 12.8 (*)    HCT 46.8 (*)    MCV  100.2 (*)    All other components within normal limits  BASIC METABOLIC PANEL     IMAGES: CT Angio Chest 11/05/2018 IMPRESSION: 1. Slightly suboptimal pulmonary artery opacification, however no central or segmental pulmonary embolism. 2. 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 3. Minimal basilar atelectasis.  EKG: 11/05/2018 Rate 92 bpm Sinus rhythm  No acute changes No significant change since last tracing   CV:  Past Medical History:  Diagnosis Date  . Anxiety   . Arthritis    joints  . Asthma   . Endometriosis   . Fat necrosis of breast    right  . GERD (gastroesophageal reflux disease)   . History of hiatal hernia    s/p repair few times  . History of suicide attempt 03/2017  . Hypertension   . MDD (major depressive disorder)   . Panniculitis   . Pneumonia 10/2018   in hospital with pneumonia  . PONV (postoperative nausea and vomiting)   . Psoriasis    treated with Humeria    Past Surgical History:  Procedure Laterality Date  . ACHILLES TENDON REPAIR Left 01-11-2015  @[facility]   . BREAST REDUCTION SURGERY Bilateral 01/09/2016   Procedure: MAMMARY REDUCTION  (BREAST) BILATERAL;  Surgeon: Wallace Going, DO;  Location: [facility];  Service: Plastics;  Laterality: Bilateral;  . CARPOMETACARPAL (CMC)  FUSION OF THUMB Right 05-04-2015   @[facility]   . COMBINED HYSTEROSCOPY DIAGNOSTIC / D&C  06-23-2003     dr 06-25-2003 @[facility]   . D & C HYSTERSCOPY /  DX LAPAROSCOPY CONVERSION LAPAROTOMY LYSIS ADHESIONS WITH RIGHT SALPINECTOMY  12-26-2003    dr  @[facility]   . LAPAROSCOPIC CHOLECYSTECTOMY  10-19-2002   dr Edward Jolly  @[facility]   . LAPAROSCOPIC GASTRIC BANDING  09-25-2003  @[facility]    WITH HIATAL HERNIA REPAIR  . LAPAROSCOPIC REPAIR AND REMOVAL OF GASTRIC BAND  02-06-2006  @[facility]   . LAPAROTOMY LYSIS ADHESIONS , UTERINE BIOPSY  01-31-2000   dr  @[facility]   . OPEN WOUND DEBRIDEMENT ANTERIOR ABDOMINAL WALL , REMOVAL 09-27-2003 BODY'S  05-10-2001   dr 13-05-2005  @[facility]   .  REPAIR RECURRENT  HIATAL HERNIA   04-23-2004  @[facility]   . TAKEDOWN RECURRENT HIATAL HERNIA FROM PREVIOUS MESH REPAIR OF DIAPHRAGM/ REPAIR HIATAL HERNIA AND THE GASTROPEXY  11/05/2004  . TONSILLECTOMY  age 73  . TOTAL ABDOMINAL HYSTERECTOMY W/ BILATERAL SALPINGOOPHORECTOMY  12-23-2005   dr soper   W/  BILATERAL URETEROLYSIS AND EXTENSIVE ABD. LYSIS ADHESIONS  . UMBILICAL HERNIA REPAIR  12/27/2009   LAPAROSCOPY TAKEDOWN INCARCERATED UMBILICAL HERNIA WITH REPAIR/ OPEN REPAIR COMPLEX LOWER INCISIONAL HERNIA    MEDICATIONS: . acetaminophen (TYLENOL) 500 MG tablet  . albuterol (PROVENTIL HFA;VENTOLIN HFA) 108 (90 BASE) MCG/ACT inhaler  . ALPRAZolam (XANAX) 0.5 MG tablet  . azelastine (ASTELIN) 137 MCG/SPRAY nasal spray  . b complex vitamins tablet  . Biotin 07-08-2001 MCG TABS  . buPROPion (WELLBUTRIN XL) 300 MG 24 hr tablet  . cetirizine (ZYRTEC) 10 MG tablet  . ipratropium-albuterol (DUONEB) 0.5-2.5 (3) MG/3ML SOLN  . mirtazapine (REMERON) 15 MG tablet  . montelukast (SINGULAIR) 10 MG tablet  . Multiple Vitamin (MULTIVITAMIN) tablet  . olmesartan (BENICAR) 40 MG tablet  . ondansetron (ZOFRAN) 4 MG tablet  . pantoprazole (PROTONIX) 40 MG tablet  . polyethylene glycol (MIRALAX / GLYCOLAX) 17 g packet  . promethazine (PHENERGAN) 25 MG tablet  . traZODone (DESYREL) 50 MG tablet  . venlafaxine XR (EFFEXOR-XR) 150 MG 24 hr capsule  . vitamin A 8000 UNIT capsule  . vitamin B-12 (CYANOCOBALAMIN) 1000 MCG tablet   No current facility-administered medications for this encounter.       Orson S[facility] Pre-Surgical Testing [phone] 01/21/19  12:58 PM

## 2019-01-22 ENCOUNTER — Encounter (HOSPITAL_COMMUNITY): Payer: Self-pay | Admitting: Anesthesiology

## 2019-01-22 LAB — NOVEL CORONAVIRUS, NAA (HOSP ORDER, SEND-OUT TO REF LAB; TAT 18-24 HRS): SARS-CoV-2, NAA: NOT DETECTED

## 2019-01-23 MED ORDER — DEXTROSE 5 % IV SOLN
3.0000 g | INTRAVENOUS | Status: AC
  Administered 2019-01-24 (×2): 3 g via INTRAVENOUS
  Filled 2019-01-23: qty 3

## 2019-01-24 ENCOUNTER — Ambulatory Visit (HOSPITAL_COMMUNITY): Payer: Medicare HMO | Admitting: Physician Assistant

## 2019-01-24 ENCOUNTER — Encounter (HOSPITAL_COMMUNITY): Payer: Self-pay | Admitting: Anesthesiology

## 2019-01-24 ENCOUNTER — Other Ambulatory Visit: Payer: Self-pay

## 2019-01-24 ENCOUNTER — Ambulatory Visit (HOSPITAL_COMMUNITY): Payer: Medicare HMO | Admitting: Anesthesiology

## 2019-01-24 ENCOUNTER — Inpatient Hospital Stay (HOSPITAL_COMMUNITY)
Admission: RE | Admit: 2019-01-24 | Discharge: 2019-01-27 | DRG: 571 | Disposition: A | Discharge status: Home / Self Care | Payer: Medicare HMO | Attending: Plastic Surgery | Admitting: Plastic Surgery

## 2019-01-24 ENCOUNTER — Encounter (HOSPITAL_COMMUNITY)
Admission: RE | Disposition: A | Discharge status: Home / Self Care | Payer: Self-pay | Source: Home / Self Care | Attending: Plastic Surgery

## 2019-01-24 DIAGNOSIS — Z6841 Body Mass Index (BMI) 40.0 and over, adult: Secondary | ICD-10-CM

## 2019-01-24 DIAGNOSIS — N641 Fat necrosis of breast: Secondary | ICD-10-CM

## 2019-01-24 DIAGNOSIS — Z887 Allergy status to serum and vaccine status: Secondary | ICD-10-CM

## 2019-01-24 DIAGNOSIS — Z888 Allergy status to other drugs, medicaments and biological substances status: Secondary | ICD-10-CM

## 2019-01-24 DIAGNOSIS — K219 Gastro-esophageal reflux disease without esophagitis: Secondary | ICD-10-CM | POA: Diagnosis present

## 2019-01-24 DIAGNOSIS — R000 Tachycardia, unspecified: Secondary | ICD-10-CM | POA: Diagnosis present

## 2019-01-24 DIAGNOSIS — I10 Essential (primary) hypertension: Secondary | ICD-10-CM | POA: Diagnosis present

## 2019-01-24 DIAGNOSIS — E881 Lipodystrophy, not elsewhere classified: Secondary | ICD-10-CM | POA: Diagnosis present

## 2019-01-24 DIAGNOSIS — K4021 Bilateral inguinal hernia, without obstruction or gangrene, recurrent: Secondary | ICD-10-CM | POA: Diagnosis present

## 2019-01-24 DIAGNOSIS — M793 Panniculitis, unspecified: Secondary | ICD-10-CM

## 2019-01-24 DIAGNOSIS — F418 Other specified anxiety disorders: Secondary | ICD-10-CM | POA: Diagnosis present

## 2019-01-24 DIAGNOSIS — N62 Hypertrophy of breast: Secondary | ICD-10-CM | POA: Diagnosis present

## 2019-01-24 DIAGNOSIS — Z9884 Bariatric surgery status: Secondary | ICD-10-CM

## 2019-01-24 DIAGNOSIS — Z8249 Family history of ischemic heart disease and other diseases of the circulatory system: Secondary | ICD-10-CM

## 2019-01-24 DIAGNOSIS — Z833 Family history of diabetes mellitus: Secondary | ICD-10-CM

## 2019-01-24 DIAGNOSIS — J45909 Unspecified asthma, uncomplicated: Secondary | ICD-10-CM | POA: Diagnosis present

## 2019-01-24 DIAGNOSIS — R710 Precipitous drop in hematocrit: Secondary | ICD-10-CM | POA: Diagnosis not present

## 2019-01-24 DIAGNOSIS — Z87891 Personal history of nicotine dependence: Secondary | ICD-10-CM

## 2019-01-24 DIAGNOSIS — K660 Peritoneal adhesions (postprocedural) (postinfection): Secondary | ICD-10-CM | POA: Diagnosis present

## 2019-01-24 HISTORY — PX: BREAST CYST EXCISION: SHX579

## 2019-01-24 HISTORY — PX: PANNICULECTOMY: SHX5360

## 2019-01-24 HISTORY — PX: LAPAROSCOPIC LYSIS OF ADHESIONS: SHX5905

## 2019-01-24 LAB — TYPE AND SCREEN
ABO/RH(D): O POS
Antibody Screen: NEGATIVE

## 2019-01-24 SURGERY — LYSIS, ADHESIONS, LAPAROSCOPIC
Anesthesia: General | Site: Abdomen | Laterality: Right

## 2019-01-24 MED ORDER — KETAMINE HCL 10 MG/ML IJ SOLN
INTRAMUSCULAR | Status: DC | PRN
  Administered 2019-01-24 (×2): 20 mg via INTRAVENOUS
  Administered 2019-01-24: 10 mg via INTRAVENOUS

## 2019-01-24 MED ORDER — ESMOLOL HCL 100 MG/10ML IV SOLN
INTRAVENOUS | Status: DC | PRN
  Administered 2019-01-24 (×4): 10 mg via INTRAVENOUS
  Administered 2019-01-24: 20 mg via INTRAVENOUS
  Administered 2019-01-24: 40 mg via INTRAVENOUS

## 2019-01-24 MED ORDER — ESMOLOL HCL 100 MG/10ML IV SOLN
INTRAVENOUS | Status: AC
  Filled 2019-01-24: qty 10

## 2019-01-24 MED ORDER — LACTATED RINGERS IV SOLN
INTRAVENOUS | Status: DC | PRN
  Administered 2019-01-24: 08:00:00 via INTRAVENOUS

## 2019-01-24 MED ORDER — ONDANSETRON HCL 4 MG/2ML IJ SOLN
4.0000 mg | Freq: Four times a day (QID) | INTRAMUSCULAR | Status: DC | PRN
  Administered 2019-01-25 – 2019-01-26 (×2): 4 mg via INTRAVENOUS
  Filled 2019-01-24 (×2): qty 2

## 2019-01-24 MED ORDER — DEXAMETHASONE SODIUM PHOSPHATE 10 MG/ML IJ SOLN
INTRAMUSCULAR | Status: DC | PRN
  Administered 2019-01-24: 10 mg via INTRAVENOUS

## 2019-01-24 MED ORDER — SUGAMMADEX SODIUM 500 MG/5ML IV SOLN
INTRAVENOUS | Status: AC
  Filled 2019-01-24: qty 5

## 2019-01-24 MED ORDER — ENOXAPARIN SODIUM 40 MG/0.4ML ~~LOC~~ SOLN
40.0000 mg | SUBCUTANEOUS | Status: DC
  Administered 2019-01-24: 40 mg via SUBCUTANEOUS
  Filled 2019-01-24: qty 0.4

## 2019-01-24 MED ORDER — MIDAZOLAM HCL 2 MG/2ML IJ SOLN
INTRAMUSCULAR | Status: AC
  Filled 2019-01-24: qty 2

## 2019-01-24 MED ORDER — KCL IN DEXTROSE-NACL 10-5-0.45 MEQ/L-%-% IV SOLN
INTRAVENOUS | Status: DC
  Administered 2019-01-24 – 2019-01-27 (×6): via INTRAVENOUS
  Filled 2019-01-24 (×8): qty 1000

## 2019-01-24 MED ORDER — ONDANSETRON HCL 4 MG/2ML IJ SOLN
INTRAMUSCULAR | Status: DC | PRN
  Administered 2019-01-24 (×2): 4 mg via INTRAVENOUS

## 2019-01-24 MED ORDER — PROMETHAZINE HCL 25 MG/ML IJ SOLN
12.5000 mg | Freq: Four times a day (QID) | INTRAMUSCULAR | Status: DC | PRN
  Administered 2019-01-24 – 2019-01-25 (×2): 12.5 mg via INTRAVENOUS
  Filled 2019-01-24 (×2): qty 1

## 2019-01-24 MED ORDER — DIPHENHYDRAMINE HCL 50 MG/ML IJ SOLN
12.5000 mg | Freq: Four times a day (QID) | INTRAMUSCULAR | Status: DC | PRN
  Administered 2019-01-25: 12.5 mg via INTRAVENOUS
  Filled 2019-01-24: qty 1

## 2019-01-24 MED ORDER — METHOCARBAMOL 500 MG PO TABS
500.0000 mg | ORAL_TABLET | Freq: Four times a day (QID) | ORAL | Status: DC | PRN
  Administered 2019-01-27 (×2): 500 mg via ORAL
  Filled 2019-01-24 (×2): qty 1

## 2019-01-24 MED ORDER — ALUM & MAG HYDROXIDE-SIMETH 200-200-20 MG/5ML PO SUSP
30.0000 mL | Freq: Four times a day (QID) | ORAL | Status: DC | PRN
  Administered 2019-01-24 – 2019-01-27 (×3): 30 mL via ORAL
  Filled 2019-01-24 (×3): qty 30

## 2019-01-24 MED ORDER — FENTANYL CITRATE (PF) 100 MCG/2ML IJ SOLN
INTRAMUSCULAR | Status: AC
  Filled 2019-01-24: qty 2

## 2019-01-24 MED ORDER — ONDANSETRON HCL 4 MG/2ML IJ SOLN
INTRAMUSCULAR | Status: AC
  Filled 2019-01-24: qty 2

## 2019-01-24 MED ORDER — HYDROMORPHONE HCL 1 MG/ML IJ SOLN
1.0000 mg | INTRAMUSCULAR | Status: DC | PRN
  Administered 2019-01-24 – 2019-01-26 (×8): 1 mg via INTRAVENOUS
  Filled 2019-01-24 (×7): qty 1

## 2019-01-24 MED ORDER — PHENYLEPHRINE 40 MCG/ML (10ML) SYRINGE FOR IV PUSH (FOR BLOOD PRESSURE SUPPORT)
PREFILLED_SYRINGE | INTRAVENOUS | Status: AC
  Filled 2019-01-24: qty 10

## 2019-01-24 MED ORDER — BUPIVACAINE HCL (PF) 0.25 % IJ SOLN
INTRAMUSCULAR | Status: DC | PRN
  Administered 2019-01-24: 25 mL

## 2019-01-24 MED ORDER — PROPOFOL 10 MG/ML IV BOLUS
INTRAVENOUS | Status: DC | PRN
  Administered 2019-01-24: 200 mg via INTRAVENOUS

## 2019-01-24 MED ORDER — LIDOCAINE-EPINEPHRINE (PF) 1 %-1:200000 IJ SOLN
INTRAMUSCULAR | Status: AC
  Filled 2019-01-24: qty 30

## 2019-01-24 MED ORDER — BUPIVACAINE LIPOSOME 1.3 % IJ SUSP
20.0000 mL | Freq: Once | INTRAMUSCULAR | Status: DC
  Filled 2019-01-24: qty 20

## 2019-01-24 MED ORDER — SCOPOLAMINE 1 MG/3DAYS TD PT72
1.0000 | MEDICATED_PATCH | TRANSDERMAL | Status: DC
  Administered 2019-01-24: 1.5 mg via TRANSDERMAL
  Filled 2019-01-24: qty 1

## 2019-01-24 MED ORDER — SODIUM CHLORIDE (PF) 0.9 % IJ SOLN
INTRAMUSCULAR | Status: AC
  Filled 2019-01-24: qty 50

## 2019-01-24 MED ORDER — LACTATED RINGERS IV SOLN
INTRAVENOUS | Status: DC
  Administered 2019-01-24: 07:00:00 via INTRAVENOUS

## 2019-01-24 MED ORDER — SENNA 8.6 MG PO TABS
1.0000 | ORAL_TABLET | Freq: Two times a day (BID) | ORAL | Status: DC
  Administered 2019-01-24 – 2019-01-27 (×5): 8.6 mg via ORAL
  Filled 2019-01-24 (×5): qty 1

## 2019-01-24 MED ORDER — HYDROMORPHONE HCL 1 MG/ML IJ SOLN
INTRAMUSCULAR | Status: AC
  Administered 2019-01-24: 0.5 mg via INTRAVENOUS
  Filled 2019-01-24: qty 1

## 2019-01-24 MED ORDER — KETOROLAC TROMETHAMINE 30 MG/ML IJ SOLN: 30.0000 mg | Freq: Once | INTRAMUSCULAR | Status: DC | PRN

## 2019-01-24 MED ORDER — FENTANYL CITRATE (PF) 250 MCG/5ML IJ SOLN
INTRAMUSCULAR | Status: AC
  Filled 2019-01-24: qty 5

## 2019-01-24 MED ORDER — OXYCODONE HCL 5 MG/5ML PO SOLN: 5.0000 mg | Freq: Once | ORAL | Status: DC | PRN

## 2019-01-24 MED ORDER — LIDOCAINE 2% (20 MG/ML) 5 ML SYRINGE
INTRAMUSCULAR | Status: DC | PRN
  Administered 2019-01-24: 1.5 mg/kg/h via INTRAVENOUS

## 2019-01-24 MED ORDER — PROMETHAZINE HCL 25 MG/ML IJ SOLN
INTRAMUSCULAR | Status: AC
  Administered 2019-01-24: 6.25 mg via INTRAVENOUS
  Filled 2019-01-24: qty 1

## 2019-01-24 MED ORDER — LIDOCAINE 2% (20 MG/ML) 5 ML SYRINGE
INTRAMUSCULAR | Status: DC | PRN
  Administered 2019-01-24: 80 mg via INTRAVENOUS

## 2019-01-24 MED ORDER — OXYCODONE HCL 5 MG PO TABS: 5.0000 mg | ORAL_TABLET | Freq: Once | ORAL | Status: DC | PRN

## 2019-01-24 MED ORDER — SUGAMMADEX SODIUM 500 MG/5ML IV SOLN
INTRAVENOUS | Status: DC | PRN
  Administered 2019-01-24: 400 mg via INTRAVENOUS

## 2019-01-24 MED ORDER — CEFAZOLIN SODIUM-DEXTROSE 2-4 GM/100ML-% IV SOLN
2.0000 g | Freq: Three times a day (TID) | INTRAVENOUS | Status: DC
  Administered 2019-01-24 – 2019-01-27 (×8): 2 g via INTRAVENOUS
  Filled 2019-01-24 (×10): qty 100

## 2019-01-24 MED ORDER — LIDOCAINE-EPINEPHRINE (PF) 1 %-1:200000 IJ SOLN
INTRAMUSCULAR | Status: DC | PRN
  Administered 2019-01-24: 25 mL

## 2019-01-24 MED ORDER — DEXTROSE 5 % IV SOLN
3.0000 g | Freq: Once | INTRAVENOUS | Status: DC
  Filled 2019-01-24: qty 3000

## 2019-01-24 MED ORDER — LIDOCAINE 2% (20 MG/ML) 5 ML SYRINGE
INTRAMUSCULAR | Status: AC
  Filled 2019-01-24: qty 5

## 2019-01-24 MED ORDER — ROCURONIUM BROMIDE 10 MG/ML (PF) SYRINGE
PREFILLED_SYRINGE | INTRAVENOUS | Status: AC
  Filled 2019-01-24: qty 10

## 2019-01-24 MED ORDER — HYDROMORPHONE HCL 1 MG/ML IJ SOLN
0.2500 mg | INTRAMUSCULAR | Status: DC | PRN
  Administered 2019-01-24 (×4): 0.5 mg via INTRAVENOUS

## 2019-01-24 MED ORDER — HYDROMORPHONE HCL 1 MG/ML IJ SOLN
INTRAMUSCULAR | Status: AC
  Administered 2019-01-24: 1 mg via INTRAVENOUS
  Filled 2019-01-24: qty 1

## 2019-01-24 MED ORDER — HYDROMORPHONE HCL 1 MG/ML IJ SOLN
INTRAMUSCULAR | Status: AC
  Filled 2019-01-24: qty 1

## 2019-01-24 MED ORDER — PHENYLEPHRINE 40 MCG/ML (10ML) SYRINGE FOR IV PUSH (FOR BLOOD PRESSURE SUPPORT)
PREFILLED_SYRINGE | INTRAVENOUS | Status: DC | PRN
  Administered 2019-01-24: 80 ug via INTRAVENOUS
  Administered 2019-01-24 (×4): 40 ug via INTRAVENOUS

## 2019-01-24 MED ORDER — METOPROLOL TARTRATE 5 MG/5ML IV SOLN
2.5000 mg | INTRAVENOUS | Status: AC | PRN
  Administered 2019-01-24 (×2): 2.5 mg via INTRAVENOUS

## 2019-01-24 MED ORDER — HYDROCODONE-ACETAMINOPHEN 5-325 MG PO TABS
1.0000 | ORAL_TABLET | ORAL | Status: DC | PRN
  Administered 2019-01-24 – 2019-01-27 (×7): 2 via ORAL
  Filled 2019-01-24 (×7): qty 2

## 2019-01-24 MED ORDER — ACETAMINOPHEN 325 MG PO TABS
325.0000 mg | ORAL_TABLET | Freq: Four times a day (QID) | ORAL | Status: DC
  Administered 2019-01-24 – 2019-01-27 (×7): 325 mg via ORAL
  Filled 2019-01-24 (×8): qty 1

## 2019-01-24 MED ORDER — PROMETHAZINE HCL 25 MG/ML IJ SOLN
6.2500 mg | INTRAMUSCULAR | Status: DC | PRN
  Administered 2019-01-24: 12:00:00 6.25 mg via INTRAVENOUS

## 2019-01-24 MED ORDER — POLYETHYLENE GLYCOL 3350 17 G PO PACK: 17.0000 g | PACK | Freq: Every day | ORAL | Status: DC | PRN

## 2019-01-24 MED ORDER — ROCURONIUM BROMIDE 10 MG/ML (PF) SYRINGE
PREFILLED_SYRINGE | INTRAVENOUS | Status: DC | PRN
  Administered 2019-01-24: 20 mg via INTRAVENOUS
  Administered 2019-01-24: 60 mg via INTRAVENOUS
  Administered 2019-01-24: 10 mg via INTRAVENOUS
  Administered 2019-01-24 (×2): 20 mg via INTRAVENOUS

## 2019-01-24 MED ORDER — KETAMINE HCL 10 MG/ML IJ SOLN
INTRAMUSCULAR | Status: AC
  Filled 2019-01-24: qty 1

## 2019-01-24 MED ORDER — NAPROXEN 250 MG PO TABS
500.0000 mg | ORAL_TABLET | Freq: Two times a day (BID) | ORAL | Status: DC | PRN
  Filled 2019-01-24: qty 2

## 2019-01-24 MED ORDER — METOPROLOL TARTRATE 5 MG/5ML IV SOLN
INTRAVENOUS | Status: AC
  Administered 2019-01-24: 2.5 mg via INTRAVENOUS
  Filled 2019-01-24: qty 5

## 2019-01-24 MED ORDER — BUPIVACAINE HCL (PF) 0.25 % IJ SOLN
INTRAMUSCULAR | Status: AC
  Filled 2019-01-24: qty 30

## 2019-01-24 MED ORDER — ACETAMINOPHEN 500 MG PO TABS
1000.0000 mg | ORAL_TABLET | Freq: Once | ORAL | Status: AC
  Administered 2019-01-24: 1000 mg via ORAL
  Filled 2019-01-24: qty 2

## 2019-01-24 MED ORDER — MIDAZOLAM HCL 5 MG/5ML IJ SOLN
INTRAMUSCULAR | Status: DC | PRN
  Administered 2019-01-24: 2 mg via INTRAVENOUS

## 2019-01-24 MED ORDER — LIDOCAINE HCL (PF) 1 % IJ SOLN
INTRAMUSCULAR | Status: AC
  Filled 2019-01-24: qty 30

## 2019-01-24 MED ORDER — FENTANYL CITRATE (PF) 100 MCG/2ML IJ SOLN
INTRAMUSCULAR | Status: DC | PRN
  Administered 2019-01-24: 100 ug via INTRAVENOUS
  Administered 2019-01-24 (×5): 50 ug via INTRAVENOUS

## 2019-01-24 MED ORDER — SODIUM CHLORIDE (PF) 0.9 % IJ SOLN
INTRAMUSCULAR | Status: DC | PRN
  Administered 2019-01-24: 41 mL

## 2019-01-24 MED ORDER — ONDANSETRON 4 MG PO TBDP: 4.0000 mg | ORAL_TABLET | Freq: Four times a day (QID) | ORAL | Status: DC | PRN

## 2019-01-24 MED ORDER — PROPOFOL 10 MG/ML IV BOLUS
INTRAVENOUS | Status: AC
  Filled 2019-01-24: qty 40

## 2019-01-24 MED ORDER — CHLORHEXIDINE GLUCONATE 4 % EX LIQD: 1.0000 "application " | Freq: Once | CUTANEOUS | Status: DC

## 2019-01-24 MED ORDER — LIDOCAINE-EPINEPHRINE 0.5 %-1:200000 IJ SOLN
INTRAMUSCULAR | Status: AC
  Filled 2019-01-24: qty 1

## 2019-01-24 MED ORDER — DIPHENHYDRAMINE HCL 12.5 MG/5ML PO ELIX
12.5000 mg | ORAL_SOLUTION | Freq: Four times a day (QID) | ORAL | Status: DC | PRN
  Administered 2019-01-26 – 2019-01-27 (×2): 12.5 mg via ORAL
  Filled 2019-01-24 (×2): qty 5

## 2019-01-24 MED ORDER — LIDOCAINE HCL 2 % IJ SOLN
INTRAMUSCULAR | Status: AC
  Filled 2019-01-24: qty 20

## 2019-01-24 SURGICAL SUPPLY — 96 items
ADH SKN CLS APL DERMABOND .7 (GAUZE/BANDAGES/DRESSINGS) ×3
APL PRP STRL LF DISP 70% ISPRP (MISCELLANEOUS) ×6
APPLIER CLIP 11 MED OPEN (CLIP) ×4
APPLIER CLIP 9.375 MED OPEN (MISCELLANEOUS)
APR CLP MED 11 20 MLT OPN (CLIP) ×3
APR CLP MED 9.3 20 MLT OPN (MISCELLANEOUS)
BAG BILE T-TUBES STRL (MISCELLANEOUS) IMPLANT
BAG DECANTER FOR FLEXI CONT (MISCELLANEOUS) ×3 IMPLANT
BAG DRN 9.5 2 ADJ BELT ADPR (MISCELLANEOUS)
BINDER ABDOMINAL 12 ML 46-62 (SOFTGOODS) ×4 IMPLANT
BIOPATCH WHT 1IN DISK W/4.0 H (GAUZE/BANDAGES/DRESSINGS) ×2 IMPLANT
BLADE CLIPPER SURG (BLADE) ×4 IMPLANT
BLADE SURG 15 STRL LF DISP TIS (BLADE) IMPLANT
BLADE SURG 15 STRL SS (BLADE)
BLADE SURG SZ10 CARB STEEL (BLADE) ×4 IMPLANT
BNDG GAUZE ELAST 4 BULKY (GAUZE/BANDAGES/DRESSINGS) IMPLANT
CABLE HIGH FREQUENCY MONO STRZ (ELECTRODE) ×4 IMPLANT
CHLORAPREP W/TINT 26 (MISCELLANEOUS) ×5 IMPLANT
CLIP APPLIE 11 MED OPEN (CLIP) IMPLANT
CLIP APPLIE 9.375 MED OPEN (MISCELLANEOUS) ×3 IMPLANT
COVER SURGICAL LIGHT HANDLE (MISCELLANEOUS) ×7 IMPLANT
COVER WAND RF STERILE (DRAPES) IMPLANT
DECANTER SPIKE VIAL GLASS SM (MISCELLANEOUS) ×4 IMPLANT
DERMABOND ADVANCED (GAUZE/BANDAGES/DRESSINGS) ×1
DERMABOND ADVANCED .7 DNX12 (GAUZE/BANDAGES/DRESSINGS) ×9 IMPLANT
DEVICE SECURE STRAP 25 ABSORB (INSTRUMENTS) IMPLANT
DEVICE TROCAR PUNCTURE CLOSURE (ENDOMECHANICALS) ×4 IMPLANT
DISSECTOR BLUNT TIP ENDO 5MM (MISCELLANEOUS) IMPLANT
DRAIN CHANNEL 15F RND FF 3/16 (WOUND CARE) ×6 IMPLANT
DRAIN CHANNEL 19F RND (DRAIN) ×2 IMPLANT
DRAPE LAPAROSCOPIC ABDOMINAL (DRAPES) ×1 IMPLANT
DRAPE SHEET LG 3/4 BI-LAMINATE (DRAPES) ×8 IMPLANT
DRSG OPSITE POSTOP 4X12 (GAUZE/BANDAGES/DRESSINGS) ×1 IMPLANT
DRSG PAD ABDOMINAL 8X10 ST (GAUZE/BANDAGES/DRESSINGS) ×10 IMPLANT
DURAPREP 26ML APPLICATOR (WOUND CARE) ×3 IMPLANT
ELECT CAUTERY BLADE TIP 2.5 (TIP)
ELECT PENCIL ROCKER SW 15FT (MISCELLANEOUS) ×4 IMPLANT
ELECT REM PT RETURN 15FT ADLT (MISCELLANEOUS) ×8 IMPLANT
ELECTRODE CAUTERY BLDE TIP 2.5 (TIP) IMPLANT
EVACUATOR SILICONE 100CC (DRAIN) ×2 IMPLANT
GAUZE SPONGE 4X4 12PLY STRL (GAUZE/BANDAGES/DRESSINGS) ×1 IMPLANT
GAUZE XEROFORM 1X8 LF (GAUZE/BANDAGES/DRESSINGS) ×3 IMPLANT
GLOVE BIO SURGEON STRL SZ 6.5 (GLOVE) ×8 IMPLANT
GLOVE BIO SURGEON STRL SZ7.5 (GLOVE) ×4 IMPLANT
GLOVE BIOGEL M 8.0 STRL (GLOVE) ×4 IMPLANT
GOWN STRL REUS W/ TWL LRG LVL3 (GOWN DISPOSABLE) ×6 IMPLANT
GOWN STRL REUS W/TWL LRG LVL3 (GOWN DISPOSABLE) ×8
GOWN STRL REUS W/TWL XL LVL3 (GOWN DISPOSABLE) ×12 IMPLANT
HOVERMATT SINGLE USE (MISCELLANEOUS) ×1 IMPLANT
KIT BASIN OR (CUSTOM PROCEDURE TRAY) ×4 IMPLANT
KIT TURNOVER KIT A (KITS) IMPLANT
MARKER SKIN DUAL TIP RULER LAB (MISCELLANEOUS) ×4 IMPLANT
NDL HYPO 25X1 1.5 SAFETY (NEEDLE) IMPLANT
NDL SPNL 22GX3.5 QUINCKE BK (NEEDLE) ×3 IMPLANT
NEEDLE HYPO 25X1 1.5 SAFETY (NEEDLE) IMPLANT
NEEDLE SPNL 22GX3.5 QUINCKE BK (NEEDLE) ×4 IMPLANT
NS IRRIG 1000ML POUR BTL (IV SOLUTION) ×4 IMPLANT
PACK GENERAL/GYN (CUSTOM PROCEDURE TRAY) ×3 IMPLANT
PIN SAFETY STERILE (MISCELLANEOUS) ×4 IMPLANT
SCISSORS LAP 5X45 EPIX DISP (ENDOMECHANICALS) ×1 IMPLANT
SCRUB TECHNI CARE 4 OZ NO DYE (MISCELLANEOUS) ×3 IMPLANT
SET IRRIG TUBING LAPAROSCOPIC (IRRIGATION / IRRIGATOR) IMPLANT
SET TUBE SMOKE EVAC HIGH FLOW (TUBING) ×4 IMPLANT
SHEARS HARMONIC ACE PLUS 45CM (MISCELLANEOUS) ×1 IMPLANT
SLEEVE XCEL OPT CAN 5 100 (ENDOMECHANICALS) ×8 IMPLANT
SPONGE LAP 18X18 RF (DISPOSABLE) ×9 IMPLANT
STAPLER VISISTAT 35W (STAPLE) ×7 IMPLANT
SUT ETHIBOND NAB CT1 #1 30IN (SUTURE) ×2 IMPLANT
SUT ETHILON 2 0 PS N (SUTURE) ×8 IMPLANT
SUT MNCRL AB 3-0 PS2 18 (SUTURE) ×1 IMPLANT
SUT MNCRL AB 4-0 PS2 18 (SUTURE) ×8 IMPLANT
SUT MON AB 3-0 SH 27 (SUTURE) ×16
SUT MON AB 3-0 SH27 (SUTURE) IMPLANT
SUT MON AB 4-0 SH 27 (SUTURE) ×4 IMPLANT
SUT MON AB 5-0 PS2 18 (SUTURE) ×7 IMPLANT
SUT NOVA NAB DX-16 0-1 5-0 T12 (SUTURE) ×4 IMPLANT
SUT PDS AB 0 CT 36 (SUTURE) ×8 IMPLANT
SUT PLAIN 5 0 P 3 18 (SUTURE) IMPLANT
SUT PROLENE 2 0 BLUE (SUTURE) ×1 IMPLANT
SUT PROLENE 2 0 SH DA (SUTURE) ×2 IMPLANT
SUT SILK 3 0 SH 30 (SUTURE) ×2 IMPLANT
SUT VIC AB 3-0 SH 27 (SUTURE) ×4
SUT VIC AB 3-0 SH 27XBRD (SUTURE) IMPLANT
SUT VIC AB 4-0 SH 18 (SUTURE) ×4 IMPLANT
SUT VICRYL 4-0 PS2 18IN ABS (SUTURE) ×1 IMPLANT
SUT VLOC 180 0 24IN GS25 (SUTURE) ×8 IMPLANT
SYR 10ML ECCENTRIC (SYRINGE) ×1 IMPLANT
SYR CONTROL 10ML LL (SYRINGE) ×4 IMPLANT
TACKER 5MM HERNIA 3.5CML NAB (ENDOMECHANICALS) IMPLANT
TOWEL OR 17X26 10 PK STRL BLUE (TOWEL DISPOSABLE) ×8 IMPLANT
TOWEL OR NON WOVEN STRL DISP B (DISPOSABLE) ×4 IMPLANT
TRAY FOLEY MTR SLVR 14FR STAT (SET/KITS/TRAYS/PACK) ×1 IMPLANT
TRAY LAPAROSCOPIC (CUSTOM PROCEDURE TRAY) ×4 IMPLANT
TROCAR BLADELESS OPT 5 100 (ENDOMECHANICALS) ×4 IMPLANT
TROCAR XCEL NON-BLD 11X100MML (ENDOMECHANICALS) IMPLANT
YANKAUER SUCT BULB TIP 10FT TU (MISCELLANEOUS) ×1 IMPLANT

## 2019-01-24 NOTE — Anesthesia Procedure Notes (Signed)
Procedure Name: Intubation Date/Time: 01/24/2019 7:40 AM Performed by: Lavina Hamman, CRNA Pre-anesthesia Checklist: Patient identified, Emergency Drugs available, Suction available, Patient being monitored and Timeout performed Patient Re-evaluated:Patient Re-evaluated prior to induction Oxygen Delivery Method: Circle system utilized Preoxygenation: Pre-oxygenation with 100% oxygen Induction Type: IV induction Ventilation: Mask ventilation without difficulty Laryngoscope Size: Mac and 4 Grade View: Grade II Tube type: Oral Tube size: 7.5 mm Number of attempts: 1 Airway Equipment and Method: Stylet Placement Confirmation: ETT inserted through vocal cords under direct vision,  positive ETCO2,  CO2 detector and breath sounds checked- equal and bilateral Secured at: 22 cm Tube secured with: Tape Dental Injury: Teeth and Oropharynx as per pre-operative assessment

## 2019-01-24 NOTE — Op Note (Addendum)
KATURA EATHERLY  Sep 21, 1970 24 January 2019    PCP:  Chesley Noon, MD   Surgeon: Kaylyn Lim, MD, FACS  Asst:  none  Anes:  general  Preop Dx: Periumbilical pain Postop Dx: Same; post enterolysis of omentum stuck to Parietex mesh; bilateral indirect inguinal herniae  Procedure: Laparoscopy and enterolysis  Location Surgery: [facility] 1 Complications: none  EBL:   minimal cc  Drains: none  Description of Procedure:  The patient was taken to OR 1 .  After anesthesia was administered and the patient was prepped  with Technicare and a timeout was performed.  Access to the abdomen was achieved with a 5 mm Optiview through the left upper quadrant without difficulty.      Omentum was adherent to the Parietex mesh that was in place.  It was attenuated in some places but no hernias seen.  Mild lower midline eventration noted.  The patient has bilateral indirect inguinal hernia that can be address at a later time after her panniculectomy.  Above photos taken after enterolysis with the Harmonic scalpel.    Dr. Marla Roe then proceeded with the panniculectomy.    In the course of her panniculectomy, she exposed the inguinal herniae.  The right was larger than the left.  With this exposure I mobilzed the herniae sacs, opened the external obliques and closed the internal rings with 2-0 prolene after transecting the gubernacula.  The hernia sacs were not opened but were reduced prior to closure of the rings.  The surrounding tissues appears attenuated and worrisome for subsequent hernia formation.  There was a site that we had visualized from the inside where the Parietex mesh had split and this would be imbricated by Dr. Marla Roe.    We discussed mesh placement and wanted to avoid it in the face of the panniculectomy.  Therefore, primary closure of the internal rings was performed along with closure of the external oblique musculature with 2-0 prolene.  Dr. Marla Roe proceeded with the  panniculectomy.      Matt B. Hassell Done, [place], [facility], [place]

## 2019-01-24 NOTE — Transfer of Care (Signed)
Immediate Anesthesia Transfer of Care Note  Patient: DEANZA UPPERMAN  Procedure(s) Performed: Procedure(s): PANNICULECTOMY (N/A) EXCISIN OF RIGHT BREAST FAT NECROSIS (Right) LAPAROSCOPY WITH LYSIS OF ADHESIONS (N/A)  Patient Location: PACU  Anesthesia Type:General  Level of Consciousness:  sedated, patient cooperative and responds to stimulation  Airway & Oxygen Therapy:Patient Spontanous Breathing and Patient connected to face mask oxgen  Post-op Assessment:  Report given to PACU RN and Post -op Vital signs reviewed and stable  Post vital signs:  Reviewed and stable  Last Vitals:  Vitals:   01/24/19 0551  BP: 137/85  Pulse: (!) 110  Resp: 18  Temp: 36.7 C  SpO2: 09%    Complications: No apparent anesthesia complications

## 2019-01-24 NOTE — Op Note (Signed)
Operative Report  Date of operation: 01/24/2019  Patient: [name], MRN: [id number], 48 y.o. female.   Date of birth: [birth year]  Location: [facility]  Preoperative Diagnosis:   Panniculitis / Abdominal Lipodystrophy Right breast fat necrosis  Postoperative Diagnosis:  Same  Procedure:   Panniculectomy Right breast excision of fat necrosis 2 x 2 cm. Right breast excision of medial excess breast tissue 1.5 x 3 cm. Bilateral abdominal hernia repair 3 x 8 cm.  Surgeon:  Theodoro Kos Dequann Vandervelden  Assistant:  Roetta Sessions, PA  Anesthesia:  General  EBL:  150cc  Drains:  2 number 23 blake round drains  Condition:  Stable  Complications: None  Procedure in Detail: Patient was seen the morning of her surgery and marked out for the procedure. She was then given an IV and IV antibiotics. The patient was taken to the operating room and underwent general anesthesia.   A time out was called and all information was confirmed to be correct. SCD's and a pillow under the knees was in place. A foley catheter was placed. The patient was then prepped and draped in the standard sterile fashion. She underwent a laparoscopy. Local was placed into the incision site. The planned lower incision was then incised and the incision taken down through the Scarpa's fascia to the rectus abdominus fascia. The skin and subcutaneous tissue was then lifted off the fascia up to the level of the umbilicus.  Bilateral inguinal hernias were identified.  These were repaired by General surgery  The patient was then flexed on the table and the amount that could be excised was confirmed. The pannus was excised and it weighed +4400 grams. The OR table was again put slightly flat and the 1-0 Ethibond was used to plicate the external oblique muscles for repair of the hernias 3 x 8 cm. The wound was irrigated with normal saline solution. Two #19 blake round drains were placed and secured with 3-0  Silk. The patient was put back in the flexed position and the closure begun. The abdominal wall was closed with buried 3-0 Monocryl and a liga-sure suture.  The subcuticular layer was closed with the 4-0 Monocryl and 5-0 Monocryl.  Attention was turned to the right breast.  The #15 blade was used to make a 5 mm incision at the 6 - 7 o'clock position.  The tissue scissors were used to release the fat necrosis and excise the excess 2 x 2 cm area that was liquified.  The vertical area was released with the scissors.  The 1.5 x 3 cm area of excess breast tissue was excised medially.  This was then closed with the 4-0 and 5-0 Monocryl.       All of the wounds were dressed with dermabond. ABD's and an abdominal binder were placed. Patient was allowed to wake up, extubated and taken on a stretcher in the flexed position to the recovery room. Family was notified at the end of the case.   The advanced practice practitioner (APP) assisted throughout the case.  The APP was essential in retraction and counter traction when needed to make the case progress smoothly.  This retraction and assistance made it possible to see the tissue plans for the procedure.  The assistance was needed for blood control, tissue re-approximation and assisted with closure of the incision site.

## 2019-01-24 NOTE — Interval H&P Note (Signed)
History and Physical Interval Note:  01/24/2019 7:47 AM  [name]  has presented today for surgery, with the diagnosis of PANNICULITIS AND VENTRAL HERNIA.  The various methods of treatment have been discussed with the patient and family. After consideration of risks, benefits and other options for treatment, the patient has consented to  Procedure(s): LAPAROSCOPIC ASSISTED VENTRAL HERNIA REPAIR (N/A) PANNICULECTOMY (N/A) EXCISIN OF RIGHT BREAST FAT NECROSIS (Right) as a surgical intervention.  The patient's history has been reviewed, patient examined, no change in status, stable for surgery.  I have reviewed the patient's chart and labs.  Questions were answered to the patient's satisfaction.     [name] Dillingham

## 2019-01-24 NOTE — Anesthesia Postprocedure Evaluation (Signed)
Anesthesia Post Note  Patient: [name]  Procedure(s) Performed: LAPAROSCOPY WITH LYSIS OF ADHESIONS (N/A Abdomen) PANNICULECTOMY (N/A ) EXCISIN OF RIGHT BREAST FAT NECROSIS (Right )     Patient location during evaluation: PACU Anesthesia Type: General Level of consciousness: awake and alert Pain management: pain level controlled Vital Signs Assessment: post-procedure vital signs reviewed and stable Respiratory status: spontaneous breathing, nonlabored ventilation, respiratory function stable and patient connected to nasal cannula oxygen Cardiovascular status: blood pressure returned to baseline and stable Postop Assessment: no apparent nausea or vomiting Anesthetic complications: no    Last Vitals:  Vitals:   01/24/19 1605 01/24/19 1719  BP: (!) 122/93 115/81  Pulse: (!) 102 (!) 112  Resp: 15 16  Temp: 36.7 C   SpO2: 95% 94%    Last Pain:  Vitals:   01/24/19 1752  TempSrc:   PainSc: 2                  Ryan P Ellender

## 2019-01-24 NOTE — Discharge Instructions (Signed)
INSTRUCTIONS FOR AFTER SURGERY   You are having surgery.  You will likely have some questions about what to expect following your operation.  The following information will help you and your family understand what to expect when you are discharged from the hospital.  Following these guidelines will help ensure a smooth recovery and reduce risks of complications.   Postoperative instructions include information on: diet, wound care, medications and physical activity.  AFTER SURGERY Expect to go home after the procedure.  In some cases, you may need to spend one night in the hospital for observation.  DIET This surgery does not require a specific diet.  However, I have to mention that the healthier you eat the better your body can start healing. It is important to increasing your protein intake.  This means limiting the foods with sugar and carbohydrates.  Focus on vegetables and some meat.  If you have any liposuction during your procedure be sure to drink water.  If your urine is bright yellow, then it is concentrated, and you need to drink more water.  As a general rule after surgery, you should have 8 ounces of water every hour while awake.  If you find you are persistently nauseated or unable to take in liquids let us know.  NO TOBACCO USE or EXPOSURE.  This will slow your healing process and increase the risk of a wound.  WOUND CARE If you have a drain: Clean with baby wipes until the drain is removed.   If you have steri-strips / tape directly attached to your skin leave them in place. It is OK to get these wet.  No baths, pools or hot tubs for two weeks. We close your incision to leave the smallest and best-looking scar. No ointment or creams on your incisions until given the go ahead.  Especially not Neosporin (Too many skin reactions with this one).  A few weeks after surgery you can use Mederma and start massaging the scar. We ask you to wear your binder or sports bra for the first 6 weeks  around the clock, including while sleeping. This provides added comfort and helps reduce the fluid accumulation at the surgery site.  ACTIVITY No heavy lifting until cleared by the doctor.  It is OK to walk and climb stairs. In fact, moving your legs is very important to decrease your risk of a blood clot.  It will also help keep you from getting deconditioned.  Every 1 to 2 hours get up and walk for 5 minutes. This will help with a quicker recovery back to normal.  Let pain be your guide so you don't do too much.  NO, you cannot do the spring cleaning and don't plan on taking care of anyone else.  This is your time for TLC.  You will be more comfortable if you sleep and rest with your head elevated either with a few pillows under you or in a recliner.  No stomach sleeping for a few months.  WORK Everyone returns to work at different times. As a rough guide, most people take at least 1 - 2 weeks off prior to returning to work. If you need documentation for your job, bring the forms to your postoperative follow up visit.  DRIVING Arrange for someone to bring you home from the hospital.  You may be able to drive a few days after surgery but not while taking any narcotics or valium.  BOWEL MOVEMENTS Constipation can occur after anesthesia and while  taking pain medication.  It is important to stay ahead for your comfort.  We recommend taking Milk of Magnesia (2 tablespoons; twice a day) while taking the pain pills.  SEROMA This is fluid your body tried to put in the surgical site.  This is normal but if it creates tight skinny skin let us know.  It usually decreases in a few weeks.  WHEN TO CALL Call your surgeon's office if any of the following occur:  Fever 101 degrees F or greater  Excessive bleeding or fluid from the incision site.  Pain that increases over time without aid from the medications  Redness, warmth, or pus draining from incision sites  Persistent nausea or inability to take  in liquids  Severe misshapen area that underwent the operation.  [facility]  What is the benefit of having a drain?  During surgery your tissue layers are separated.  This raw surface stimulates your body to fill the space with serous fluid.  This is normal but you don't want that fluid to collect and prevent healing.  A fluid collection can also become infected.  The Jackson-Pratt (JP) drain is used to eliminate this collection of fluid and allow the tissue to heal together.    Jackson-Pratt (JP) bulb    How to care for your drainage and suction unit at home Your drainage catheter will be connected to a collection device. The vacuum caused when the device is compressed allows drainage to collect in the device.     Wash your hands with soap and water before and after touching the system.  Empty the JP drain every 12 hours once you get home from your procedure.  Record the fluid amount on the record sheet included.  Start with stripping the drain tube to push the clots or excess fluid to the bulb.  Do this by pinching the tube with one hand near your skin.  Then with the other hand squeeze the tubing and work it toward the bulb.  This should be done several times a day.  This may collapse the tube which will correct on its own.    Use a safety pin to attach your collection device to your clothing so there is no tension on the insertion site.    If you have drainage at the skin insertion site, you can apply a gauze dressing and secure it with tape.  If the drain falls out, apply a gauze dressing over the drain insertion site and secure with tape.   To empty the collection device:    Release the stopper on the top of the collection unit (bulb).   Pour contents into a measuring container such as a plastic medicine cup.   Record the day and amount of drainage on the attached sheet.  This should be done at least twice a day.    To compress the Jackson-Pratt  Bulb:   Release the stopper at the top of the bulb.  Squeeze the bulb tightly in your fist, squeezing air out of the bulb.   Replace the stopper while the bulb is compressed.   Be careful not to spill the contents when squeezing the bulb.  The drainage will start bright red and turn to pink and then yellow with time.  IMPORTANT: If the bulb is not squeezed before adding the stopper it will not draw out the fluid.  Care for the JP drain site and your skin daily:   You may shower three days  after surgery.  Secure the drain to a ribbon or cloth around your waist while showering so it does not pull out while showering.  Be sure your hands are cleaned with soap and water.  Use a clean wet cotton swab to clean the skin around the drain site.   Use another cotton swab to place Vaseline or antibiotic ointment on the skin around the drain.     Contact your physician if any of the following occur:   The fluid in the bulb becomes cloudy.  Your temperature is greater than 101.4.   The incision opens.  If you have drainage at the skin insertion site, you can apply a gauze dressing and secure it with tape.  If the drain falls out, apply a gauze dressing over the drain insertion site and secure with tape.   You will usually have more drainage when you are active than while you rest or are asleep. If the drainage increases significantly or is bloody call the physician                             Bring this record with you to each office visit Date  Drainage Volume  Date   Drainage volume

## 2019-01-24 NOTE — H&P (Signed)
Chief Complaint:  Periumbilical pain at site of prior hernia repair by Dr. Excell Seltzer in 2012  History of Present Illness:  [name] is an 48 y.o. female well known to me with the above complaint.  CT abdomen has shown herniated fat adjacent his repair with Parietex mesh.  Will plan laparoscopy and evaluation and repair along with panniculectomy by Dr. Marla Roe  Past Medical History:  Diagnosis Date  . Anxiety   . Arthritis    joints  . Asthma   . Endometriosis   . Fat necrosis of breast    right  . GERD (gastroesophageal reflux disease)   . History of hiatal hernia    s/p repair few times  . History of suicide attempt 03/2017  . Hypertension   . MDD (major depressive disorder)   . Panniculitis   . Pneumonia 10/2018   in hospital with pneumonia  . PONV (postoperative nausea and vomiting)   . Psoriasis    treated with Humeria    Past Surgical History:  Procedure Laterality Date  . ACHILLES TENDON REPAIR Left 01-11-2015  @[facility]   . BREAST REDUCTION SURGERY Bilateral 01/09/2016   Procedure: MAMMARY REDUCTION  (BREAST) BILATERAL;  Surgeon: Wallace Going, DO;  Location: [facility];  Service: Plastics;  Laterality: Bilateral;  . CARPOMETACARPAL (CMC) FUSION OF THUMB Right 05-04-2015   @[facility]   . COMBINED HYSTEROSCOPY DIAGNOSTIC / D&C  06-23-2003     dr Ulanda Edison @[facility]   . D & C HYSTERSCOPY /  DX LAPAROSCOPY CONVERSION LAPAROTOMY LYSIS ADHESIONS WITH RIGHT SALPINECTOMY  12-26-2003    dr Quincy Simmonds  @[facility]   . LAPAROSCOPIC CHOLECYSTECTOMY  10-19-2002   dr Deon Pilling  @[facility]   . LAPAROSCOPIC GASTRIC BANDING  09-25-2003  @[facility]    WITH HIATAL HERNIA REPAIR  . LAPAROSCOPIC REPAIR AND REMOVAL OF GASTRIC BAND  02-06-2006  @[facility]   . LAPAROTOMY LYSIS ADHESIONS , UTERINE BIOPSY  01-31-2000   dr Corinna Capra  @[facility]   . OPEN WOUND DEBRIDEMENT ANTERIOR ABDOMINAL WALL , REMOVAL FORGEIN BODY'S  05-10-2001   dr Deon Pilling  @[facility]   . REPAIR RECURRENT  HIATAL HERNIA   04-23-2004  @[facility]   . TAKEDOWN RECURRENT HIATAL HERNIA  FROM PREVIOUS MESH REPAIR OF DIAPHRAGM/ REPAIR HIATAL HERNIA AND THE GASTROPEXY  11/05/2004  . TONSILLECTOMY  age 62  . TOTAL ABDOMINAL HYSTERECTOMY W/ BILATERAL SALPINGOOPHORECTOMY  12-23-2005   dr soper   W/  BILATERAL URETEROLYSIS AND EXTENSIVE ABD. LYSIS ADHESIONS  . UMBILICAL HERNIA REPAIR  12/27/2009   LAPAROSCOPY TAKEDOWN INCARCERATED UMBILICAL HERNIA WITH REPAIR/ OPEN REPAIR COMPLEX LOWER INCISIONAL HERNIA    Current Facility-Administered Medications  Medication Dose Route Frequency Provider Last Rate Last Dose  . bupivacaine liposome (EXPAREL) 1.3 % injection 266 mg  20 mL Infiltration Once Johnathan Hausen, MD      . ceFAZolin (ANCEF) 3 g in dextrose 5 % 50 mL IVPB  3 g Intravenous On Call to OR Johnathan Hausen, MD      . chlorhexidine (HIBICLENS) 4 % liquid 1 application  1 application Topical Once Dillingham, [name], DO      . lactated ringers infusion   Intravenous Continuous Johnathan Hausen, MD 10 mL/hr at 01/24/19 [phone]    . scopolamine (TRANSDERM-SCOP) 1 MG/3DAYS 1.5 mg  1 patch Transdermal Q72H Ellender, Karyl Kinnier, MD   1.5 mg at 01/24/19 9604   Influenza vaccines and Lisinopril-hydrochlorothiazide Family History  Problem Relation Age of Onset  . Cancer Maternal Aunt        melanoma  . Diabetes Father   .  Heart attack Father 42   Social History:   reports that she quit smoking about 3 years ago. Her smoking use included cigarettes. She has a 20.00 pack-year smoking history. She has never used smokeless tobacco. She reports that she does not drink alcohol or use drugs.   REVIEW OF SYSTEMS : Negative except for see problem list  Physical Exam:   Blood pressure 137/85, pulse (!) 110, temperature 98.1 F (36.7 C), temperature source Oral, resp. rate 18, height 5\' 11"  (1.803 m), weight (!) 157.9 kg, SpO2 99 %. Body mass index is 48.54 kg/m.  Gen:  WDWN WF NAD  Neurological: Alert and oriented to person, place, and time. Motor and sensory function is grossly intact  Head:  Normocephalic and atraumatic.  Eyes: Conjunctivae are normal. Pupils are equal, round, and reactive to light. No scleral icterus.  Neck: Normal range of motion. Neck supple. No tracheal deviation or thyromegaly present.  Cardiovascular:  SR without murmurs or gallops.  No carotid bruits Breast:  Not examined-for right fat necrosis surgery by Dr. 12-27-1983 Respiratory: Effort normal.  No respiratory distress. No chest wall tenderness. Breath sounds normal.  No wheezes, rales or rhonchi.  Abdomen:  Sore around the umbilicus GU:  Pannus noted Musculoskeletal: Normal range of motion. Extremities are nontender. No cyanosis, edema or clubbing noted Lymphadenopathy: No cervical, preauricular, postauricular or axillary adenopathy is present Skin: Skin is warm and dry. No rash noted. No diaphoresis. No erythema. No pallor. Pscyh: Normal mood and affect. Behavior is normal. Judgment and thought content normal.   LABORATORY RESULTS: Results for orders placed or performed during the hospital encounter of 01/24/19 (from the past 48 hour(s))  Type and screen [facility]     Status: None (Preliminary result)   Collection Time: 01/24/19  6:50 AM  Result Value Ref Range   ABO/RH(D) PENDING    Antibody Screen PENDING    Sample Expiration      01/27/2019,2359 Performed at [facility], [address] [place], [place]      RADIOLOGY RESULTS: No results found.  Problem List: Patient Active Problem List   Diagnosis Date Noted  . Pneumonia 09/23/2018  . Depression with anxiety 09/23/2018  . Psoriasis 09/23/2018  . Tachyarrhythmia   . Ventral hernia 06/09/2018  . Breast, fat necrosis 05/11/2018  . Posttraumatic stress disorder 04/05/2018  . Panniculitis 03/10/2018  . Major depressive disorder, single episode, unspecified 04/02/2017  . MDD (major depressive disorder), recurrent episode, severe (HCC) 03/24/2017  . Symptomatic mammary hypertrophy  01/09/2016  . Nicotine dependence 12/25/2015    Assessment & Plan: Ventral hernia-recurrent by CT;  For lap assisted repair and panniculectomy (Dr. 12/27/2015)    Matt B. Ulice Bold, MD, [facility] [phone] beeper [phone]  01/24/2019 7:24 AM

## 2019-01-25 ENCOUNTER — Encounter (HOSPITAL_COMMUNITY): Payer: Self-pay | Admitting: Certified Registered Nurse Anesthetist

## 2019-01-25 ENCOUNTER — Encounter (HOSPITAL_COMMUNITY)
Admission: RE | Disposition: A | Discharge status: Home / Self Care | Payer: Self-pay | Source: Home / Self Care | Attending: Plastic Surgery

## 2019-01-25 ENCOUNTER — Encounter (HOSPITAL_COMMUNITY): Payer: Self-pay | Admitting: Surgery

## 2019-01-25 LAB — CBC
HCT: 39.9 % (ref 36.0–46.0)
HCT: 34 % — ABNORMAL LOW (ref 36.0–46.0)
HCT: 35.4 % — ABNORMAL LOW (ref 36.0–46.0)
HCT: 36.4 % (ref 36.0–46.0)
Hemoglobin: 12.4 g/dL (ref 12.0–15.0)
Hemoglobin: 10.9 g/dL — ABNORMAL LOW (ref 12.0–15.0)
Hemoglobin: 11.2 g/dL — ABNORMAL LOW (ref 12.0–15.0)
Hemoglobin: 11.4 g/dL — ABNORMAL LOW (ref 12.0–15.0)
MCH: 31.7 pg (ref 26.0–34.0)
MCH: 32.4 pg (ref 26.0–34.0)
MCH: 32.1 pg (ref 26.0–34.0)
MCH: 31.9 pg (ref 26.0–34.0)
MCHC: 31.1 g/dL (ref 30.0–36.0)
MCHC: 32.1 g/dL (ref 30.0–36.0)
MCHC: 31.6 g/dL (ref 30.0–36.0)
MCHC: 31.3 g/dL (ref 30.0–36.0)
MCV: 102 fL — ABNORMAL HIGH (ref 80.0–100.0)
MCV: 101.2 fL — ABNORMAL HIGH (ref 80.0–100.0)
MCV: 101.4 fL — ABNORMAL HIGH (ref 80.0–100.0)
MCV: 102 fL — ABNORMAL HIGH (ref 80.0–100.0)
Platelets: 235 10*3/uL (ref 150–400)
Platelets: 198 10*3/uL (ref 150–400)
Platelets: 214 10*3/uL (ref 150–400)
Platelets: 229 10*3/uL (ref 150–400)
RBC: 3.91 MIL/uL (ref 3.87–5.11)
RBC: 3.36 MIL/uL — ABNORMAL LOW (ref 3.87–5.11)
RBC: 3.49 MIL/uL — ABNORMAL LOW (ref 3.87–5.11)
RBC: 3.57 MIL/uL — ABNORMAL LOW (ref 3.87–5.11)
RDW: 13.2 % (ref 11.5–15.5)
RDW: 13.2 % (ref 11.5–15.5)
RDW: 13.2 % (ref 11.5–15.5)
RDW: 13.2 % (ref 11.5–15.5)
WBC: 12.1 10*3/uL — ABNORMAL HIGH (ref 4.0–10.5)
WBC: 12.5 10*3/uL — ABNORMAL HIGH (ref 4.0–10.5)
WBC: 13.8 10*3/uL — ABNORMAL HIGH (ref 4.0–10.5)
WBC: 14.2 10*3/uL — ABNORMAL HIGH (ref 4.0–10.5)
nRBC: 0 % (ref 0.0–0.2)
nRBC: 0 % (ref 0.0–0.2)
nRBC: 0 % (ref 0.0–0.2)
nRBC: 0 % (ref 0.0–0.2)

## 2019-01-25 LAB — SURGICAL PATHOLOGY

## 2019-01-25 SURGERY — PANNICULECTOMY
Anesthesia: General

## 2019-01-25 MED ORDER — FENTANYL CITRATE (PF) 250 MCG/5ML IJ SOLN
INTRAMUSCULAR | Status: AC
  Filled 2019-01-25: qty 5

## 2019-01-25 MED ORDER — PROMETHAZINE HCL 25 MG/ML IJ SOLN
25.0000 mg | Freq: Once | INTRAMUSCULAR | Status: DC
  Filled 2019-01-25: qty 1

## 2019-01-25 MED ORDER — MIDAZOLAM HCL 2 MG/2ML IJ SOLN
INTRAMUSCULAR | Status: AC
  Filled 2019-01-25: qty 2

## 2019-01-25 NOTE — Anesthesia Preprocedure Evaluation (Deleted)
Anesthesia Evaluation  Patient identified by MRN, date of birth, ID band Patient awake    Reviewed: Allergy & Precautions, NPO status , Patient's Chart, lab work & pertinent test results  History of Anesthesia Complications (+) PONV and history of anesthetic complications  Airway Mallampati: II  TM Distance: >3 FB Neck ROM: Full    Dental no notable dental hx.    Pulmonary asthma , pneumonia, resolved, former smoker,    Pulmonary exam normal breath sounds clear to auscultation       Cardiovascular hypertension, Pt. on medications  Rhythm:Regular Rate:Tachycardia  ECG: NSR, rate 92   Neuro/Psych PSYCHIATRIC DISORDERS Anxiety Depression negative neurological ROS     GI/Hepatic Neg liver ROS, hiatal hernia, GERD  Medicated and Controlled,  Endo/Other  Morbid obesity  Renal/GU negative Renal ROS     Musculoskeletal negative musculoskeletal ROS (+)   Abdominal (+) + obese,   Peds  Hematology negative hematology ROS (+)   Anesthesia Other Findings PANNICULITIS VENTRAL HERNIA  Reproductive/Obstetrics                             Anesthesia Physical  Anesthesia Plan  ASA: III and emergent  Anesthesia Plan: General   Post-op Pain Management:    Induction: Intravenous and Cricoid pressure planned  PONV Risk Score and Plan: 4 or greater and Midazolam, Dexamethasone, Ondansetron and Treatment may vary due to age or medical condition  Airway Management Planned: Oral ETT  Additional Equipment:   Intra-op Plan:   Post-operative Plan: Extubation in OR  Informed Consent: I have reviewed the patients History and Physical, chart, labs and discussed the procedure including the risks, benefits and alternatives for the proposed anesthesia with the patient or authorized representative who has indicated his/her understanding and acceptance.     Dental advisory given  Plan Discussed with:  CRNA  Anesthesia Plan Comments: (Reviewed PAT note 01/20/2019, Konrad Felix, PA-C)        Anesthesia Quick Evaluation

## 2019-01-25 NOTE — Progress Notes (Signed)
1 Day Post-Op  Subjective: [name] was re-evaluated this afternoon with Dr. Marla Roe.  She has continue to have JP drain output, mostly sanguinous with a slight tint of serous fluid on evaluation this evening. Lovenox held. SCDs for ppx. Patient walking from bathroom to bed during evaluation. Total JP drain output 1335 cc since 7am today. Total post-operatively ~ 2100 cc.   She reports that she is feeling dizzy, fatigued on evaluation, but did not have trouble ambulating. No fevers, chills, n/v.   She has been tachycardic but normotensive at this time.  Objective: Vital signs in last 24 hours: Temp:  [97.8 F (36.6 C)-98.7 F (37.1 C)] 98.4 F (36.9 C) (10/20 1727) Pulse Rate:  [85-128] 117 (10/20 1727) Resp:  [16-20] 16 (10/20 1727) BP: (118-149)/(64-91) 125/83 (10/20 1727) SpO2:  [91 %-98 %] 96 % (10/20 1727) Last BM Date: 01/25/19  Intake/Output from previous day: 10/19 0701 - 10/20 0700 In: 3889.5 [P.O.:480; I.V.:3209.5; IV Piggyback:200] Out: 2630 [Urine:1600; Drains:830; Blood:200] Intake/Output this shift: Total I/O In: 1127.5 [P.O.:480; I.V.:447.5; IV Piggyback:200] Out: 2090 [Urine:800; Drains:1290]  General appearance: alert, cooperative, no distress and resting in bed. Normal skin color, warm extremities. Head: Normocephalic, without obvious abnormality, atraumatic Resp: unlabored, symmetric rise and fall Breasts: normal appearance, no masses or tenderness Cardio: tachycardic Extremities: extremities normal, atraumatic, no cyanosis or edema Pulses: 2+ and symmetric, Bilateral LE pulses intact, tachycardic. Skin: Skin color, texture, turgor normal. No rashes or lesions. Bilateral palms with normal color. Neurologic: Grossly normal Abdomen: generalized TTP, mild swelling, no rebound tenderness. No ecchymosis noted. Incision/Wound: Incision c/d/i. Honey comb dressing in place, no wound dehiscence noted. JP drains bilaterally with sanguinous fluid in bulbs. No  surrounding erythema noted.    Lab Results:  CBC Latest Ref Rng & Units 01/25/2019 01/25/2019 01/25/2019  WBC 4.0 - 10.5 K/uL 13.8(H) 12.5(H) 12.1(H)  Hemoglobin 12.0 - 15.0 g/dL 11.2(L) 10.9(L) 12.4  Hematocrit 36.0 - 46.0 % 35.4(L) 34.0(L) 39.9  Platelets 150 - 400 K/uL 214 198 235    BMET No results for input(s): NA, K, CL, CO2, GLUCOSE, BUN, CREATININE, CALCIUM in the last 72 hours. PT/INR No results for input(s): LABPROT, INR in the last 72 hours. ABG No results for input(s): PHART, HCO3 in the last 72 hours.  Invalid input(s): PCO2, PO2  Studies/Results: No results found.  Anti-infectives: Anti-infectives (From admission, onward)   Start     Dose/Rate Route Frequency Ordered Stop   01/24/19 2000  ceFAZolin (ANCEF) IVPB 2g/100 mL premix     2 g 200 mL/hr over 30 Minutes Intravenous Every 8 hours 01/24/19 1158     01/24/19 1130  ceFAZolin (ANCEF) 3 g in dextrose 5 % 50 mL IVPB  Status:  Discontinued     3 g 100 mL/hr over 30 Minutes Intravenous  Once 01/24/19 1100 01/24/19 1609   01/24/19 0600  ceFAZolin (ANCEF) 3 g in dextrose 5 % 50 mL IVPB     3 g 100 mL/hr over 30 Minutes Intravenous On call to O.R. 01/23/19 0822 01/24/19 1125      Assessment/Plan: s/p Procedure(s): PANNICULECTOMY EXCISIN OF RIGHT BREAST FAT NECROSIS  DVT ppx with SCDs.   NPO until AM for re-evaluation. CBC check at 9pm to monitor hgb - nursing to call MD/PA to read results.   Hgb currently 11.2, improving since prior read at 10.9. Continue to monitor.  OOB with assistance, patient knows to not ambulate if she is dizzy, unstable on her feet.  At this time, continue  to closely monitor patient. Call MD or PA for changes in status.     LOS: 0 days    Leslee Home, PA-C 01/25/2019

## 2019-01-25 NOTE — Progress Notes (Addendum)
Patient complains of pain 8/10 which made her heart rate increase, pain medication was given and MD was notified, will continue to monitor.   Patient made NPO repeat CBC ordered, lovenox discontinued, call MD when blood work has resulted.

## 2019-01-25 NOTE — Progress Notes (Signed)
1 Day Post-Op  Subjective: Mrs. Mallo is 1 day post op from panniculectomy, removal of right breast fat necrosis by Dr. Marla Roe. She also underwent laparoscopy/enterolysis and bilateral open indirect inguinal hernia repair by Dr. Hassell Done yesterday.  She feels well this morning. She reports her drains gave her some trouble last night and had some leaking around the tubes. She has had sanguinous output bilaterally. No dizziness, fatigue, headaches. JP drains: LLQ with 570 cc per EMR and right with 230 cc per EMR. Additional 90 cc drained on right this AM and ~ 70 cc on left.  All drainage is sanguinous in nature, dark in color.   No BM or flatus yet, she has had Maalox.  Denies fever, chills, vomiting. She has had some nausea, which is not uncommon for her post-operatively.   Denies any SOB, CP, dysuria.  She does not feel as if she would be comfortable being at home today, she still is in a lot of pain and would have difficulty being home without assistance. She lives with her mother, who is not independent. She is ambulating to and from bathroom at this time.  Objective: Vital signs in last 24 hours: Temp:  [96.8 F (36 C)-98.5 F (36.9 C)] 97.8 F (36.6 C) (10/20 0539) Pulse Rate:  [85-121] 103 (10/20 0539) Resp:  [9-20] 20 (10/20 0539) BP: (94-159)/(72-119) 132/83 (10/20 0539) SpO2:  [91 %-100 %] 98 % (10/20 0539) Last BM Date: 01/23/19  Intake/Output from previous day: 10/19 0701 - 10/20 0700 In: 3889.5 [P.O.:480; I.V.:3209.5; IV Piggyback:200] Out: 2630 [Urine:1600; Drains:830; Blood:200] Intake/Output this shift: No intake/output data recorded.  General appearance: alert, cooperative, no distress and sitting up in bed Head: Normocephalic, without obvious abnormality, atraumatic Resp: unlabored, symmetric rise and fall Chest wall: right sided chest wall tenderness, skin irritation noted over sternum/anterior midline chest.  Breasts: post-op incisions right breast medially  and lateral NAC. Incisions c/d/i. Post-reduction scars - healed well. No erythema, swelling. Extremities: extremities normal, atraumatic, no cyanosis or edema Pulses: 2+ and symmetric Skin: skin color, texture normal. Slight erythema/irritation on right/midline chest wall Neurologic: Grossly normal Incision/Wound: Post-panicculectomy incisions c/d/i - visible through honeycomb dressing. No drainage noted, no dehiscence overtly noted, no surrounding erythema. JP drains in place with sanguinous output.   Lab Results:  CBC    Component Value Date/Time   WBC 15.2 (H) 01/24/2019 1320   RBC 4.41 01/24/2019 1320   HGB 14.2 01/24/2019 1320   HCT 44.7 01/24/2019 1320   PLT 239 01/24/2019 1320   MCV 101.4 (H) 01/24/2019 1320   MCH 32.2 01/24/2019 1320   MCHC 31.8 01/24/2019 1320   RDW 13.1 01/24/2019 1320   LYMPHSABS 3.1 11/05/2018 2056   MONOABS 0.6 11/05/2018 2056   EOSABS 0.2 11/05/2018 2056   BASOSABS 0.0 11/05/2018 2056    BMET Recent Labs    01/24/19 1320  CREATININE 1.10*   PT/INR No results for input(s): LABPROT, INR in the last 72 hours. ABG No results for input(s): PHART, HCO3 in the last 72 hours.  Invalid input(s): PCO2, PO2  Studies/Results: No results found.  Anti-infectives: Anti-infectives (From admission, onward)   Start     Dose/Rate Route Frequency Ordered Stop   01/24/19 2000  ceFAZolin (ANCEF) IVPB 2g/100 mL premix     2 g 200 mL/hr over 30 Minutes Intravenous Every 8 hours 01/24/19 1158     01/24/19 1130  ceFAZolin (ANCEF) 3 g in dextrose 5 % 50 mL IVPB  Status:  Discontinued  3 g 100 mL/hr over 30 Minutes Intravenous  Once 01/24/19 1100 01/24/19 1609   01/24/19 0600  ceFAZolin (ANCEF) 3 g in dextrose 5 % 50 mL IVPB     3 g 100 mL/hr over 30 Minutes Intravenous On call to O.R. 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  Continue with use of incentive spirometer and OOB to bathroom and ambulating as tolerated with assistance PRN.  Maalox, MOM or miralax for assistance with BM.  Continue to monitor JP drain output, possible hold on Lovenox to decrease output. SCDs when resting in bed, OOB and ambulate as able and with assistance if needed.  DVT TGG:YIRS, OOB. Lovenox d/c'ed this AM. Ancef abx coverage  Incisions c/d/i, dermabond in place. JP drains in place - monitor output.   Plan for d/c tomorrow, pending patient's status today and overnight.    LOS: 0 days    Leslee Home, PA-C 01/25/2019

## 2019-01-26 DIAGNOSIS — Z833 Family history of diabetes mellitus: Secondary | ICD-10-CM | POA: Diagnosis not present

## 2019-01-26 DIAGNOSIS — Z6841 Body Mass Index (BMI) 40.0 and over, adult: Secondary | ICD-10-CM | POA: Diagnosis not present

## 2019-01-26 DIAGNOSIS — Z9884 Bariatric surgery status: Secondary | ICD-10-CM | POA: Diagnosis not present

## 2019-01-26 DIAGNOSIS — I10 Essential (primary) hypertension: Secondary | ICD-10-CM | POA: Diagnosis present

## 2019-01-26 DIAGNOSIS — Z8249 Family history of ischemic heart disease and other diseases of the circulatory system: Secondary | ICD-10-CM | POA: Diagnosis not present

## 2019-01-26 DIAGNOSIS — F418 Other specified anxiety disorders: Secondary | ICD-10-CM | POA: Diagnosis present

## 2019-01-26 DIAGNOSIS — Z888 Allergy status to other drugs, medicaments and biological substances status: Secondary | ICD-10-CM | POA: Diagnosis not present

## 2019-01-26 DIAGNOSIS — K4021 Bilateral inguinal hernia, without obstruction or gangrene, recurrent: Secondary | ICD-10-CM | POA: Diagnosis present

## 2019-01-26 DIAGNOSIS — E881 Lipodystrophy, not elsewhere classified: Secondary | ICD-10-CM | POA: Diagnosis present

## 2019-01-26 DIAGNOSIS — M793 Panniculitis, unspecified: Secondary | ICD-10-CM | POA: Diagnosis present

## 2019-01-26 DIAGNOSIS — K660 Peritoneal adhesions (postprocedural) (postinfection): Secondary | ICD-10-CM | POA: Diagnosis present

## 2019-01-26 DIAGNOSIS — J45909 Unspecified asthma, uncomplicated: Secondary | ICD-10-CM | POA: Diagnosis present

## 2019-01-26 DIAGNOSIS — R710 Precipitous drop in hematocrit: Secondary | ICD-10-CM | POA: Diagnosis not present

## 2019-01-26 DIAGNOSIS — N641 Fat necrosis of breast: Secondary | ICD-10-CM | POA: Diagnosis present

## 2019-01-26 DIAGNOSIS — R000 Tachycardia, unspecified: Secondary | ICD-10-CM | POA: Diagnosis present

## 2019-01-26 DIAGNOSIS — Z87891 Personal history of nicotine dependence: Secondary | ICD-10-CM | POA: Diagnosis not present

## 2019-01-26 DIAGNOSIS — N62 Hypertrophy of breast: Secondary | ICD-10-CM | POA: Diagnosis present

## 2019-01-26 DIAGNOSIS — Z887 Allergy status to serum and vaccine status: Secondary | ICD-10-CM | POA: Diagnosis not present

## 2019-01-26 DIAGNOSIS — K219 Gastro-esophageal reflux disease without esophagitis: Secondary | ICD-10-CM | POA: Diagnosis present

## 2019-01-26 LAB — BASIC METABOLIC PANEL
Anion gap: 9 (ref 5–15)
BUN: 23 mg/dL — ABNORMAL HIGH (ref 6–20)
CO2: 24 mmol/L (ref 22–32)
Calcium: 8.1 mg/dL — ABNORMAL LOW (ref 8.9–10.3)
Chloride: 97 mmol/L — ABNORMAL LOW (ref 98–111)
Creatinine, Ser: 1.15 mg/dL — ABNORMAL HIGH (ref 0.44–1.00)
GFR calc Af Amer: >60 mL/min (ref >60)
GFR calc non Af Amer: 56 mL/min — ABNORMAL LOW (ref >60)
Glucose, Bld: 138 mg/dL — ABNORMAL HIGH (ref 70–99)
Potassium: 4.1 mmol/L (ref 3.5–5.1)
Sodium: 130 mmol/L — ABNORMAL LOW (ref 135–145)

## 2019-01-26 LAB — CBC WITH DIFFERENTIAL/PLATELET
Abs Immature Granulocytes: 0.08 10*3/uL — ABNORMAL HIGH (ref 0.00–0.07)
Basophils Absolute: 0.1 10*3/uL (ref 0.0–0.1)
Basophils Relative: 0 %
Eosinophils Absolute: 0.2 10*3/uL (ref 0.0–0.5)
Eosinophils Relative: 1 %
HCT: 33.1 % — ABNORMAL LOW (ref 36.0–46.0)
Hemoglobin: 10.5 g/dL — ABNORMAL LOW (ref 12.0–15.0)
Immature Granulocytes: 1 %
Lymphocytes Relative: 25 %
Lymphs Abs: 3.6 10*3/uL (ref 0.7–4.0)
MCH: 32.3 pg (ref 26.0–34.0)
MCHC: 31.7 g/dL (ref 30.0–36.0)
MCV: 101.8 fL — ABNORMAL HIGH (ref 80.0–100.0)
Monocytes Absolute: 1.3 10*3/uL — ABNORMAL HIGH (ref 0.1–1.0)
Monocytes Relative: 9 %
Neutro Abs: 9.3 10*3/uL — ABNORMAL HIGH (ref 1.7–7.7)
Neutrophils Relative %: 64 %
Platelets: 191 10*3/uL (ref 150–400)
RBC: 3.25 MIL/uL — ABNORMAL LOW (ref 3.87–5.11)
RDW: 13.2 % (ref 11.5–15.5)
WBC: 14.6 10*3/uL — ABNORMAL HIGH (ref 4.0–10.5)
nRBC: 0 % (ref 0.0–0.2)

## 2019-01-26 LAB — CBC
HCT: 33 % — ABNORMAL LOW (ref 36.0–46.0)
Hemoglobin: 10.5 g/dL — ABNORMAL LOW (ref 12.0–15.0)
MCH: 32.1 pg (ref 26.0–34.0)
MCHC: 31.8 g/dL (ref 30.0–36.0)
MCV: 100.9 fL — ABNORMAL HIGH (ref 80.0–100.0)
Platelets: 202 10*3/uL (ref 150–400)
RBC: 3.27 MIL/uL — ABNORMAL LOW (ref 3.87–5.11)
RDW: 13.2 % (ref 11.5–15.5)
WBC: 14.7 10*3/uL — ABNORMAL HIGH (ref 4.0–10.5)
nRBC: 0 % (ref 0.0–0.2)

## 2019-01-26 NOTE — Progress Notes (Signed)
2 Days Post-Op  Subjective: No acute overnight events. No change in status. Still tachycardic, but mostly normotensive.   Endorses still feeling weak and fatigued, slightly dizzy and with a mild headache. Symptoms have not worsened per patient and she is still able to ambulate, with assistance due to safety.  Incisions c/d/i. New honeycomb dressing placed. JP drain output has slowed, becoming more serosanguinous, but still mostly sanguinous in nature. ~ 40 cc of fluid drained from each bulb this AM. She has not been wearing abdominal binder or breast binder due to them being too tight. She has loose spanks on at this time.   She reports she is feeling itchy. No rashes noted.  Hgb this AM at 10.5. Hct 33.1. WBC 14.6.   Denies fever, chills, n/v. Denies CP, SOB, palpitations. No other complaints, pain has been adequately controlled.   Objective: Vital signs in last 24 hours: Temp:  [97.6 F (36.4 C)-99 F (37.2 C)] 98 F (36.7 C) (10/21 0621) Pulse Rate:  [117-129] 129 (10/21 0621) Resp:  [16-20] 20 (10/21 0621) BP: (104-130)/(59-87) 125/75 (10/21 0621) SpO2:  [89 %-97 %] 97 % (10/21 0621) Last BM Date: 01/25/19  Intake/Output from previous day: 10/20 0701 - 10/21 0700 In: 2878.6 [P.O.:480; I.V.:2098.6; IV Piggyback:300] Out: 3350 [Urine:1600; Drains:1750] Intake/Output this shift: Total I/O In: -  Out: 85 [Drains:85]  General appearance: alert, cooperative, no distress and resting in bed. Normal skin color, warm extremities. Head: Normocephalic, without obvious abnormality, atraumatic Resp: unlabored, symmetric rise and fall, CTAB, no wheezes, rhonchi. Breasts: normal appearance, no masses or tenderness. Incision c/d/i, dermabond in place Cardio: tachycardic, regular rhythm, no murmur, rubs or gallops. Extremities: extremities normal, atraumatic, no cyanosis or edema Pulses: 2+ and symmetric, Bilateral LE pulses intact. Skin: Skin color, texture, turgor normal. No rashes or  lesions. Bilateral palms with normal color. Neurologic: Grossly normal Abdomen: generalized TTP, mild swelling, no rebound tenderness. No ecchymosis noted. Incision/Wound: Incision c/d/i. Honey comb dressing in place, no wound dehiscence noted. JP drains bilaterally with sanguinous fluid in bulbs. No surrounding erythema noted.    Lab Results:  CBC    Component Value Date/Time   WBC 14.6 (H) 01/26/2019 0413   RBC 3.25 (L) 01/26/2019 0413   HGB 10.5 (L) 01/26/2019 0413   HCT 33.1 (L) 01/26/2019 0413   PLT 191 01/26/2019 0413   MCV 101.8 (H) 01/26/2019 0413   MCH 32.3 01/26/2019 0413   MCHC 31.7 01/26/2019 0413   RDW 13.2 01/26/2019 0413   LYMPHSABS 3.6 01/26/2019 0413   MONOABS 1.3 (H) 01/26/2019 0413   EOSABS 0.2 01/26/2019 0413   BASOSABS 0.1 01/26/2019 0413   CBC Latest Ref Rng & Units 01/26/2019 01/25/2019 01/25/2019  WBC 4.0 - 10.5 K/uL 14.6(H) 14.2(H) 13.8(H)  Hemoglobin 12.0 - 15.0 g/dL 10.5(L) 11.4(L) 11.2(L)  Hematocrit 36.0 - 46.0 % 33.1(L) 36.4 35.4(L)  Platelets 150 - 400 K/uL 191 229 214    BMET No results for input(s): NA, K, CL, CO2, GLUCOSE, BUN, CREATININE, CALCIUM in the last 72 hours. PT/INR No results for input(s): LABPROT, INR in the last 72 hours. ABG No results for input(s): PHART, HCO3 in the last 72 hours.  Invalid input(s): PCO2, PO2  Studies/Results: No results found.  Anti-infectives: Anti-infectives (From admission, onward)   Start     Dose/Rate Route Frequency Ordered Stop   01/24/19 2000  ceFAZolin (ANCEF) IVPB 2g/100 mL premix     2 g 200 mL/hr over 30 Minutes Intravenous Every 8 hours 01/24/19 1158  01/24/19 1130  ceFAZolin (ANCEF) 3 g in dextrose 5 % 50 mL IVPB  Status:  Discontinued     3 g 100 mL/hr over 30 Minutes Intravenous  Once 01/24/19 1100 01/24/19 1609   01/24/19 0600  ceFAZolin (ANCEF) 3 g in dextrose 5 % 50 mL IVPB     3 g 100 mL/hr over 30 Minutes Intravenous On call to O.R. 01/23/19 0822 01/24/19 1125       Assessment/Plan: s/p Procedure(s): PANNICULECTOMY EXCISIN OF RIGHT BREAST FAT NECROSIS LAPAROSCOPY WITH LYSIS OF ADHESIONS  Patient's symptoms have been stable overnight, she is still tachycardic and weak, but this should improve as her intravascular volume normalizes - continue to monitor, fluids increased last night.  JP drain output has slowed, fluid has become slightly less sanguinous with a mixture of serosanguinous output.  DVT/VTE ppx with SCDs and OOB with assistance. She knows importance of wearing SCDs while in bed. On exam today, patient without SCDs in place.   Diet changed from NPO to heart healthy.  Hgb 10.5 at this time, recheck at 12:00. Will re-check patient this afternoon. Patient and nursing to call with updates or status changes.  Nursing staff to call with any questions or concerns.   LOS: 0 days    Leslee Home, PA-C 01/26/2019

## 2019-01-26 NOTE — Progress Notes (Signed)
2 Days Post-Op  Subjective: [name] is feeling a lot better this afternoon. She still feels tired, but denies any dizziness, headache, sob, fever, chills, n/v.  Her boyfriend is here with her and they have been walking the halls. She is a lot steadier on her feet and has been eating. + flatus, no BM yet.  She does not have any symptoms of tachycardia. Denies palpitations. She reports that she typically runs high. She has a history of a vagotomy. Normotensive.  She is still feeling somewhat itchy, mostly at sites where she has IV tape or clear tape in contact with her skin. Slight irritation noted on L upper arm and R antecubital fossa.  Objective: Vital signs in last 24 hours: Temp:  [97.6 F (36.4 C)-99 F (37.2 C)] 98.6 F (37 C) (10/21 1353) Pulse Rate:  [117-129] 120 (10/21 1353) Resp:  [16-20] 18 (10/21 1353) BP: (104-130)/(59-87) 125/83 (10/21 1353) SpO2:  [89 %-97 %] 97 % (10/21 0621) Last BM Date: 01/25/19  Intake/Output from previous day: 10/20 0701 - 10/21 0700 In: 2878.6 [P.O.:480; I.V.:2098.6; IV Piggyback:300] Out: 3350 [Urine:1600; Drains:1750] Intake/Output this shift: Total I/O In: 358.3 [I.V.:358.3] Out: 1345 [Urine:900; Drains:445]  General appearance:alert, cooperative, no distress andresting in bed.  Head:Normocephalic, without obvious abnormality, atraumatic Resp:unlabored, symmetric rise and fall, CTAB, no wheezes, rhonchi noted. Breasts:normal appearance, no masses or tenderness. Incision c/d/i, dermabond in place Cardio:tachycardic, regular rhythm, no murmur, rubs or gallops. Extremities:extremities normal, atraumatic, no cyanosis or edema. Irritation noted over R antecubital fossa from prior IV, L upper arm irritation noted. Pulses:2+ and symmetric, Bilateral LE pulses intact. Skin:Skin color, texture, turgor normal. No rashes or lesions. Bilateral palms with normal color, warm.  Neurologic:Grossly normal Abdomen: Tenderness improved.  Mild swelling, no rebound tenderness. No ecchymosis noted. Incision/Wound:Incision c/d/i. Honey comb dressing in place, no wound dehiscence noted, no reaction to honeycomb noted. JP drains bilaterally with sanguinous fluid in bulbs, becoming more serous in nature. No surrounding erythema noted.  Lab Results:  CBC    Component Value Date/Time   WBC 14.7 (H) 01/26/2019 1136   RBC 3.27 (L) 01/26/2019 1136   HGB 10.5 (L) 01/26/2019 1136   HCT 33.0 (L) 01/26/2019 1136   PLT 202 01/26/2019 1136   MCV 100.9 (H) 01/26/2019 1136   MCH 32.1 01/26/2019 1136   MCHC 31.8 01/26/2019 1136   RDW 13.2 01/26/2019 1136   LYMPHSABS 3.6 01/26/2019 0413   MONOABS 1.3 (H) 01/26/2019 0413   EOSABS 0.2 01/26/2019 0413   BASOSABS 0.1 01/26/2019 0413    BMET Recent Labs    01/26/19 1136  NA 130*  K 4.1  CL 97*  CO2 24  GLUCOSE 138*  BUN 23*  CREATININE 1.15*  CALCIUM 8.1*   PT/INR No results for input(s): LABPROT, INR in the last 72 hours. ABG No results for input(s): PHART, HCO3 in the last 72 hours.  Invalid input(s): PCO2, PO2  Studies/Results: No results found.  Anti-infectives: Anti-infectives (From admission, onward)   Start     Dose/Rate Route Frequency Ordered Stop   01/24/19 2000  ceFAZolin (ANCEF) IVPB 2g/100 mL premix     2 g 200 mL/hr over 30 Minutes Intravenous Every 8 hours 01/24/19 1158     01/24/19 1130  ceFAZolin (ANCEF) 3 g in dextrose 5 % 50 mL IVPB  Status:  Discontinued     3 g 100 mL/hr over 30 Minutes Intravenous  Once 01/24/19 1100 01/24/19 1609   01/24/19 0600  ceFAZolin (ANCEF) 3 g  in dextrose 5 % 50 mL IVPB     3 g 100 mL/hr over 30 Minutes Intravenous On call to O.R. 01/23/19 0822 01/24/19 1125      Assessment/Plan: s/p Procedure(s): PANNICULECTOMY EXCISIN OF RIGHT BREAST FAT NECROSIS LAPAROSCOPY WITH LYSIS OF ADHESIONS  DVT/VTE ppx with SCDs and OOB with assistance. She knows importance of wearing SCDs while in bed.  Heart healthy diet. Low  sugar/carbs, high protein diet for optimal healing. Patient drinking soda on exam, recommended to discard and drink water.  Bed in flexed position - avoid straining abdominal incisions.   Hgb 10.5 this AM. 10.5 at 12:00. Recheck in AM tomorrow.  Nursing staff to call with any questions or concerns.  Continue with current plan as stated earlier. Patient's hgb stable at this time, continue to monitor clinical status and plan for d/c tomorrow pending patient status overnight and AM labs.    LOS: 0 days    Charlies Constable, PA-C 01/26/2019

## 2019-01-26 NOTE — Progress Notes (Signed)
Patients heart rate is still elevated, MD is aware, hgb 10.5 will continue to monitor.

## 2019-01-27 LAB — CBC
HCT: 29.9 % — ABNORMAL LOW (ref 36.0–46.0)
Hemoglobin: 9.5 g/dL — ABNORMAL LOW (ref 12.0–15.0)
MCH: 32.1 pg (ref 26.0–34.0)
MCHC: 31.8 g/dL (ref 30.0–36.0)
MCV: 101 fL — ABNORMAL HIGH (ref 80.0–100.0)
Platelets: 201 10*3/uL (ref 150–400)
RBC: 2.96 MIL/uL — ABNORMAL LOW (ref 3.87–5.11)
RDW: 13.2 % (ref 11.5–15.5)
WBC: 14 10*3/uL — ABNORMAL HIGH (ref 4.0–10.5)
nRBC: 0 % (ref 0.0–0.2)

## 2019-01-27 NOTE — Plan of Care (Signed)
Assessment unchanged. Pt verbalized understanding of dc instructions through teach back including drain care. No scripts. Discharged via wc to front entrance accompanied by NT.

## 2019-01-27 NOTE — Progress Notes (Signed)
3 Days Post-Op  Subjective: [name] reports she had a great nights sleep and is feeling better. No new symptoms or worsening weakness.  Patient mostly tachycardic since admission, prior to surgical intervention. Slightly elevated above normal for her at this time. Stable at ~ 118-120. No symptoms of this per pt. Slight tachycardia is normal for her since vagotomy.  No BM yet, + BM and increased BS this am.  Itching significantly improved since removal of honeycomb dressing. Incisions c/d/i. JP drains in place - drainage becoming more serosanguinous.   She has been using incentive spirometer. Reports walking the unit this AM without difficulty. Pain adequately controlled, ~ level 3-4 without medication  She denies fever, chills, n/v. Denies dysuria, SOB, CP, palpitations, dizziness.  Objective: Vital signs in last 24 hours: Temp:  [98.6 F (37 C)-98.7 F (37.1 C)] 98.6 F (37 C) (10/22 0453) Pulse Rate:  [118-120] 118 (10/22 0453) Resp:  [18] 18 (10/22 0453) BP: (103-138)/(60-83) 103/60 (10/22 0453) SpO2:  [97 %-100 %] 97 % (10/22 0453) Last BM Date: 01/25/19  Intake/Output from previous day: 10/21 0701 - 10/22 0700 In: 846 [P.O.:120; I.V.:626; IV Piggyback:100] Out: 1985 [Urine:900; Drains:1085] Intake/Output this shift: Total I/O In: 120 [P.O.:120] Out: 380 [Drains:380]   General appearance:alert, cooperative, no distress andresting in bed.  Head:Normocephalic, without obvious abnormality, atraumatic Resp:unlabored, symmetric rise and fall, CTAB, no wheezes, rhonchi noted. Breasts:normal appearance, no masses or tenderness. Incision c/d/i, dermabond in place Cardio:tachycardic, regular rhythm, no murmur, rubs or gallops. Extremities:extremities normal, atraumatic, no cyanosis or edema. Patient without SCDs in place this AM, but reportedly worse them overnight Pulses:2+ and symmetric, Bilateral LE pulses intact, tachycardic. Skin:Skin color, texture, turgor  normal. Bilateral palms with normal color, warm. Neurologic:Grossly normal Abdomen: Minimal tenderness, swelling improved. No rebound tenderness. No ecchymosis noted. Incision/Wound:Incision c/d/i. ABD pads in place, no wound dehiscence noted, no reaction to honeycomb noted. JP drains bilaterally with serosanguinous fluid in bulbs, becoming more serous in nature. No incisional erythema.Slight erythema noted where tape previously in place along L JP drain.  Lab Results:  CBC    Component Value Date/Time   WBC 14.0 (H) 01/27/2019 0328   RBC 2.96 (L) 01/27/2019 0328   HGB 9.5 (L) 01/27/2019 0328   HCT 29.9 (L) 01/27/2019 0328   PLT 201 01/27/2019 0328   MCV 101.0 (H) 01/27/2019 0328   MCH 32.1 01/27/2019 0328   MCHC 31.8 01/27/2019 0328   RDW 13.2 01/27/2019 0328   LYMPHSABS 3.6 01/26/2019 0413   MONOABS 1.3 (H) 01/26/2019 0413   EOSABS 0.2 01/26/2019 0413   BASOSABS 0.1 01/26/2019 0413    CBC Latest Ref Rng & Units 01/27/2019 01/26/2019 01/26/2019  WBC 4.0 - 10.5 K/uL 14.0(H) 14.7(H) 14.6(H)  Hemoglobin 12.0 - 15.0 g/dL 9.5(L) 10.5(L) 10.5(L)  Hematocrit 36.0 - 46.0 % 29.9(L) 33.0(L) 33.1(L)  Platelets 150 - 400 K/uL 201 202 191    BMET Recent Labs    01/26/19 1136  NA 130*  K 4.1  CL 97*  CO2 24  GLUCOSE 138*  BUN 23*  CREATININE 1.15*  CALCIUM 8.1*   PT/INR No results for input(s): LABPROT, INR in the last 72 hours. ABG No results for input(s): PHART, HCO3 in the last 72 hours.  Invalid input(s): PCO2, PO2  Studies/Results: No results found.  Anti-infectives: Anti-infectives (From admission, onward)   Start     Dose/Rate Route Frequency Ordered Stop   01/24/19 2000  ceFAZolin (ANCEF) IVPB 2g/100 mL premix     2  g 200 mL/hr over 30 Minutes Intravenous Every 8 hours 01/24/19 1158     01/24/19 1130  ceFAZolin (ANCEF) 3 g in dextrose 5 % 50 mL IVPB  Status:  Discontinued     3 g 100 mL/hr over 30 Minutes Intravenous  Once 01/24/19 1100 01/24/19 1609    01/24/19 0600  ceFAZolin (ANCEF) 3 g in dextrose 5 % 50 mL IVPB     3 g 100 mL/hr over 30 Minutes Intravenous On call to O.R. 01/23/19 0822 01/24/19 1125      Assessment/Plan: s/p Procedure(s): POD2  PANNICULECTOMY EXCISIN OF RIGHT BREAST FAT NECROSIS LAPAROSCOPY WITH LYSIS OF ADHESIONS  Patient was kept overnight for 2 nights for monitoring and management for post-op bleeding, acute drop in Hgb, pain control and elevated WBC. She is doing a lot better this AM on evaluation and is stable for d/c. She is comfortable going home and will have assistance from her boyfriend, terry.  SCDs and OOB for VTE ppx  + flatus, no BM, recommend miralax, MoM at home. Drink a lot of water when home to prevent dehydration - decrease soda intake.  Hgb this AM 9.5, down from 10.5 last night. No new symptoms, weakness improving. Ambulating without assistance and without fatigue, dizziness.   Patient stable for d/c. Patient to follow up in clinic within 1 week.   Return precautions provided. Reasons to call office or seek medical attention thoroughly explained and patient agreed and understood.   LOS: 1 day    Leslee Home, PA-C 01/27/2019

## 2019-01-27 NOTE — Discharge Summary (Signed)
Physician Discharge Summary  Patient ID: [name] MRN: [id number] DOB/AGE: 1970-11-19 48 y.o.  Admit date: 01/24/2019 Discharge date: 01/27/2019  Admission Diagnoses: Panniculitis  Discharge Diagnoses:  Active Problems:   Panniculitis   Discharged Condition: good  Hospital Course: [name] is a 48 year old female who presented to the [facility] for panniculectomy and excision of right breast fat necrosis by Dr. Marla Roe and laparoscopy with lysis of adhesions, bilateral inguinal hernia repair, open by Dr. Hassell Done on 01/24/19.  Postoperatively patient had acute drop in hemoglobin followed by weakness, fatigue, dizziness. Over the course of 2 days her JP drain output became more serosanguineous and her weakness/fatigue improved.  She is slightly tachycardic, but this is not uncommon for her as she had a vagotomy in the past. She is asymptomatic other than mild weakness at time of d/c.  Today on exam she is feeling a lot better, less weak, pain adequately controlled. She is comfortable going home and reports she will have assistance at home.   Consults: None  Significant Diagnostic Studies: labs: cbc.  Treatments: IV hydration, antibiotics: Ancef, analgesia: acetaminophen, Vicodin and Dilaudid, anticoagulation: lovenox and surgery: panniculectomy, bilateral hernia repair, open. Laparoscopy.  Discharge Exam: Blood pressure 103/60, pulse (!) 118, temperature 98.6 F (37 C), temperature source Oral, resp. rate 18, height 5\' 11"  (1.803 m), weight (!) 157.9 kg, SpO2 97 %. General appearance:alert, cooperative, no distress andresting in bed.  Head:Normocephalic, without obvious abnormality, atraumatic Resp:unlabored, symmetric rise and fall, CTAB, no wheezes, rhonchinoted. Breasts:normal appearance, no masses or tenderness. Incision c/d/i, dermabond in place Cardio:tachycardic, regular rhythm, no murmur, rubs or gallops. Extremities:extremities normal,  atraumatic, no cyanosis or edema. Patient without SCDs in place this AM, but reportedly worse them overnight Pulses:2+ and symmetric, Bilateral LE pulses intact, tachycardic. Skin:Skin color, texture, turgor normal. Bilateral palms with normal color, warm. Neurologic:Grossly normal Abdomen:Minimal tenderness, swelling improved. No rebound tenderness. No ecchymosis noted. Incision/Wound:Incision c/d/i. ABD pads in place, no wound dehiscence noted, no reaction to honeycomb noted.JP drains bilaterally with serosanguinous fluid in bulbs, becoming more serous in nature.No incisional erythema.Slight erythema noted where tape previously in place along L JP drain.   Disposition: Discharge disposition: 01-Home or Self Care       Discharge Instructions    Call MD for:  difficulty breathing, headache or visual disturbances   Complete by: As directed    Call MD for:  extreme fatigue   Complete by: As directed    Call MD for:  hives   Complete by: As directed    Call MD for:  persistant dizziness or light-headedness   Complete by: As directed    Call MD for:  persistant nausea and vomiting   Complete by: As directed    Call MD for:  redness, tenderness, or signs of infection (pain, swelling, redness, odor or green/yellow discharge around incision site)   Complete by: As directed    Call MD for:  severe uncontrolled pain   Complete by: As directed    Call MD for:  temperature >100.4   Complete by: As directed    Diet - low sodium heart healthy   Complete by: As directed    Increase activity slowly   Complete by: As directed      Allergies as of 01/27/2019      Reactions   Influenza Vaccines    States her "arm had a knot and turned red"   Lisinopril-hydrochlorothiazide    welts      Medication  List    TAKE these medications   acetaminophen 500 MG tablet Commonly known as: TYLENOL Take 1,000 mg by mouth every 4 (four) hours as needed for fever or headache.   albuterol  108 (90 Base) MCG/ACT inhaler Commonly known as: VENTOLIN HFA Inhale 2 puffs into the lungs every 6 (six) hours as needed. For shortness of breath   ALPRAZolam 0.5 MG tablet Commonly known as: XANAX Take 0.5 mg by mouth 3 (three) times daily as needed for anxiety.   azelastine 0.1 % nasal spray Commonly known as: ASTELIN Place 2 sprays into both nostrils 2 (two) times daily as needed for rhinitis. Use in each nostril as directed   b complex vitamins tablet Take 1 tablet by mouth daily.   Biotin 78295 MCG Tabs Take 10,000 mcg by mouth daily.   buPROPion 300 MG 24 hr tablet Commonly known as: WELLBUTRIN XL Take 300 mg by mouth daily.   cetirizine 10 MG tablet Commonly known as: ZYRTEC Take 10 mg by mouth daily as needed for allergies.   ipratropium-albuterol 0.5-2.5 (3) MG/3ML Soln Commonly known as: DUONEB Take 3 mLs by nebulization every 6 (six) hours as needed. What changed: reasons to take this   mirtazapine 15 MG tablet Commonly known as: REMERON Take 1 tablet (15 mg total) by mouth at bedtime. For mood control   multivitamin tablet Take 1 tablet by mouth daily.   olmesartan 40 MG tablet Commonly known as: BENICAR Take 40 mg by mouth every morning.   ondansetron 4 MG tablet Commonly known as: Zofran Take 1 tablet (4 mg total) by mouth every 8 (eight) hours as needed for nausea or vomiting.   polyethylene glycol 17 g packet Commonly known as: MIRALAX / GLYCOLAX Take 17 g by mouth daily as needed for mild constipation.   promethazine 25 MG tablet Commonly known as: PHENERGAN Take 25 mg by mouth every 8 (eight) hours as needed for nausea or vomiting.   Protonix 40 MG tablet Generic drug: pantoprazole Take 40 mg by mouth 2 (two) times daily.   Singulair 10 MG tablet Generic drug: montelukast Take 10 mg by mouth at bedtime.   traZODone 50 MG tablet Commonly known as: DESYREL Take 1 tablet (50 mg total) by mouth at bedtime as needed for sleep. For  sleep What changed: when to take this   venlafaxine XR 150 MG 24 hr capsule Commonly known as: EFFEXOR-XR Take 150 mg by mouth daily with breakfast.   vitamin A 8000 UNIT capsule Take 8,000 Units by mouth daily.   vitamin B-12 1000 MCG tablet Commonly known as: CYANOCOBALAMIN Take 1,000 mcg by mouth daily.      Follow-up Information    Dillingham, [name], DO In 1 week.   Specialty: Plastic Surgery Contact information: [address] [place] [address] [phone]           Signed: Kermit Balo Brandonlee Navis 01/27/2019, 9:15 AM

## 2019-02-01 ENCOUNTER — Encounter: Payer: Self-pay | Admitting: Plastic Surgery

## 2019-02-01 ENCOUNTER — Other Ambulatory Visit: Payer: Self-pay

## 2019-02-01 ENCOUNTER — Ambulatory Visit (INDEPENDENT_AMBULATORY_CARE_PROVIDER_SITE_OTHER): Payer: Medicare HMO | Admitting: Plastic Surgery

## 2019-02-01 VITALS — BP 119/75 | HR 121 | Temp 96.8°F | Ht 71.0 in | Wt 352.4 lb

## 2019-02-01 DIAGNOSIS — M793 Panniculitis, unspecified: Secondary | ICD-10-CM

## 2019-02-01 DIAGNOSIS — K439 Ventral hernia without obstruction or gangrene: Secondary | ICD-10-CM

## 2019-02-01 MED ORDER — CEPHALEXIN 500 MG PO CAPS: 500.0000 mg | ORAL_CAPSULE | Freq: Four times a day (QID) | ORAL | 0 refills | Status: DC

## 2019-02-01 MED ORDER — HYDROCODONE-ACETAMINOPHEN 5-325 MG PO TABS: 1.0000 | ORAL_TABLET | Freq: Four times a day (QID) | ORAL | 0 refills | Status: DC | PRN

## 2019-02-01 NOTE — Progress Notes (Signed)
   Subjective:    Patient ID: [name], female    DOB: 03/08/1971, 48 y.o.   MRN: [id number]  The patient is a 48 year old female here for follow-up after undergoing a hernia repair and panniculectomy.  Patient states she has had quite a bit of pain.  The pain medicine does seem to be helping. She is getting more than 100 cc of serosanguineous drainage from the drain tubes each day.  She is starting to move around a little bit more.  There is a little bit of redness around the incision site.  This looks more like irritation than anything.   Review of Systems  Constitutional: Positive for activity change.  Eyes: Negative.   Cardiovascular: Negative.   Gastrointestinal: Positive for abdominal pain.  Musculoskeletal: Positive for arthralgias.  Psychiatric/Behavioral: Negative.        Objective:   Physical Exam Vitals signs and nursing note reviewed.  Constitutional:      Appearance: Normal appearance.  Cardiovascular:     Rate and Rhythm: Normal rate.  Pulmonary:     Effort: Pulmonary effort is normal.  Abdominal:     General: Abdomen is flat.     Tenderness: There is abdominal tenderness.  Neurological:     General: No focal deficit present.     Mental Status: She is alert and oriented to person, place, and time.  Psychiatric:        Mood and Affect: Mood normal.        Behavior: Behavior normal.        Assessment & Plan:     ICD-10-CM   1. Ventral hernia without obstruction or gangrene  K43.9   2. Panniculitis  M79.3     Will see if I can have some supplies sent to her house. She may shower. Continue drain care. Continue light compression of abdomen Follow up in 1 week. Hope to be able to remove one drain next week. Will refill abx and pain meds.

## 2019-02-03 ENCOUNTER — Telehealth: Payer: Self-pay | Admitting: *Deleted

## 2019-02-03 ENCOUNTER — Telehealth: Payer: Self-pay | Admitting: Surgical

## 2019-02-03 MED ORDER — DOXYCYCLINE HYCLATE 100 MG PO TABS: 100.0000 mg | ORAL_TABLET | Freq: Two times a day (BID) | ORAL | 0 refills | Status: DC

## 2019-02-03 NOTE — Telephone Encounter (Signed)
Faxed order to [facility] for supplies to be mailed to the patient's home.  Confirmation received.//AB/CMA 

## 2019-02-03 NOTE — Telephone Encounter (Signed)
Patient called this AM to report panniculectomy incision has opened at midline and L lateral side. She reports there was a fair amount of drainage that came out. Description consistent with serosanguinous in nature.  She reports there is some slight irritation at the peri-wound and peri-incisional area. She denies any fever, chills, n/v, palpitations, dizziness, SOB, CP. She feels okay other than mild burning pain. No other complaints.  Empirically sent abx to pharmacy for patient. Reasons to call back provided, including but not limited to fever, chills, significant nausea or vomiting, CP, SOB, worsening symptoms of fatigue, bleeding, worsening erythema, purulent drainage, worsening incisional dehiscence. She understood and agreed.   Will check on patient this afternoon.

## 2019-02-04 ENCOUNTER — Other Ambulatory Visit: Payer: Self-pay

## 2019-02-04 ENCOUNTER — Encounter: Payer: Self-pay | Admitting: Surgical

## 2019-02-04 ENCOUNTER — Ambulatory Visit (INDEPENDENT_AMBULATORY_CARE_PROVIDER_SITE_OTHER): Payer: Medicare HMO | Admitting: Surgical

## 2019-02-04 DIAGNOSIS — T8131XA Disruption of external operation (surgical) wound, not elsewhere classified, initial encounter: Secondary | ICD-10-CM

## 2019-02-04 DIAGNOSIS — M793 Panniculitis, unspecified: Secondary | ICD-10-CM

## 2019-02-04 DIAGNOSIS — K439 Ventral hernia without obstruction or gangrene: Secondary | ICD-10-CM

## 2019-02-04 DIAGNOSIS — N62 Hypertrophy of breast: Secondary | ICD-10-CM

## 2019-02-04 NOTE — Progress Notes (Addendum)
Subjective:     Patient ID: [name], female    DOB: [birth year], 48 y.o.   MRN: [id number]  Chief Complaint  Patient presents with  . Follow-up    HPI: The patient is a 48 y.o. female here for follow-up after panniculectomy and excision of breast fat necrosis on 01/24/19 with Dr. Marla Roe. She also underwent bilateral open inguinal hernia repair and laparoscopy with lysis of adhesions at that time by Dr. Hassell Done.  She spent 2 nights in the hospital due to high volume output from JP drain concerning for active bleeding, pain control and monitoring. She was d/c'ed home on 01/27/2019 and was feeling well without signs of active bleeding, infection. While in the hospital, she had removed her abdominal binder due to discomfort and was wearing loosely fitting spanks at that time.   She was last seen in this office on 02/01/19 for post-op evaluation and was doing well other than pain, peri-wound irritation. She called the office yesterday twice. In the AM she reported the incision had opened up midline and on the left lateral side a few inches and had noticed some drainage that was serosanguinous in nature. She had some irritation at the peri-wound area, but no overt sign of infection per pt description.  She called yesterday afternoon at ~ 7pm reporting the wound had opened up to ~ 8 inches starting at the midline area. She was feeling weak, but overall okay. She also reported that the power in her house went out.  Today she is having burning at the right drain site and some general weakness. She does not have any fever, chills, n/v. Temp in office today 98 F using temporal thermometer. Incision has opened ~ 30 cm, non-infected, mild serosanguinous drainage noted. This is not unexpected as patient has a poor history of healing and has had episodes similar to this in the past when healing from breast reduction.   Peri-wound is irritated. She has not been wearing any compression garments. She  reports she is drinking plenty of water, but her appetite has been decreased.   Denies any CP, SOB, bleeding, purulence, fever, chills, vomiting, dizziness, HA, vision changes.  Review of Systems  Constitutional: Positive for activity change. Negative for chills, diaphoresis, fatigue and fever.  Respiratory: Negative for chest tightness, shortness of breath and wheezing.   Cardiovascular: Negative for chest pain, palpitations and leg swelling.  Gastrointestinal: Negative for nausea and vomiting.  Musculoskeletal: Positive for arthralgias.  Skin: Positive for color change and wound. Negative for pallor and rash.  Neurological: Positive for weakness. Negative for dizziness, numbness and headaches.    Objective:   Vital Signs There were no vitals taken for this visit. Vital Signs and Nursing Note Reviewed Chaperone present Physical Exam  Constitutional: She is oriented to person, place, and time and well-developed, well-nourished, and in no distress.  HENT:  Head: Normocephalic and atraumatic.  Eyes: Pupils are equal, round, and reactive to light.  Neck: Normal range of motion. Neck supple.  Cardiovascular: Normal rate.  Pulmonary/Chest: Effort normal. No respiratory distress.  Abdominal: Bowel sounds are normal. She exhibits no distension and no mass. There is abdominal tenderness. There is no rebound and no guarding.    Incision dehisced ~ 30 cm in width across panniculectomy incision. No purulence noted. Peri-wound area slightly erythematous, but no overt sign of infection. Mild abdominal tenderness noted.  Musculoskeletal:        General: No deformity or edema.  Neurological: She is alert and  oriented to person, place, and time.  Skin: Skin is warm and dry. No rash noted. She is not diaphoretic. There is erythema. No pallor.  Psychiatric: Mood, affect and judgment normal.    Assessment/Plan:     ICD-10-CM   1. Ventral hernia without obstruction or gangrene  K43.9   2.  Panniculitis  M79.3   3. Symptomatic mammary hypertrophy  N62   4. Postoperative wound dehiscence, initial encounter  T81.31XA    [name] is a 48 yo female post-op from panniculectomy and excision of breast fat necrosis. Her abdominal incision has dehisced and is open ~ 30 cm across midline with a left sided shift, no purulence noted. This was not unexpected as patient has a history of poor wound healing and has also not been wearing her compression garment around her abdomen. She presented today without compression garment or spanks for compression.   No sign of infection at this time. No purulent drainage noted. JP drains removed.  Recommend wearing spanks that cover entire abdomen if she cannot tolerate abdominal binder. Binder should be worn for minimum 6 weeks post-op.  Home health wound vac ordered for assistance with wound closure. Will place in office next week or have home health place it.  Plan for excision/debridement and partial closure next week with Dr. Ulice Bold. Patient to apply wet-to-dry dressings with saline, ABD pads daily. Continue with abx daily.  Follow up next week, plan for OR next week. Patient knows to call with any questions or concerns over the weekend. Reasons to be evaluated in ED explained thoroughly to patient and mothers understanding.  Kermit Balo Mica Ramdass, PA-C 02/04/2019, 3:48 PM

## 2019-02-06 ENCOUNTER — Encounter (HOSPITAL_COMMUNITY): Payer: Self-pay

## 2019-02-06 ENCOUNTER — Inpatient Hospital Stay (HOSPITAL_COMMUNITY)
Admission: EM | Admit: 2019-02-06 | Discharge: 2019-02-10 | DRG: 908 | Disposition: A | Discharge status: Home Health | Payer: Medicare HMO | Attending: Internal Medicine | Admitting: Internal Medicine

## 2019-02-06 ENCOUNTER — Inpatient Hospital Stay (HOSPITAL_COMMUNITY): Payer: Medicare HMO

## 2019-02-06 ENCOUNTER — Other Ambulatory Visit: Payer: Self-pay

## 2019-02-06 DIAGNOSIS — L089 Local infection of the skin and subcutaneous tissue, unspecified: Secondary | ICD-10-CM

## 2019-02-06 DIAGNOSIS — Y834 Other reconstructive surgery as the cause of abnormal reaction of the patient, or of later complication, without mention of misadventure at the time of the procedure: Secondary | ICD-10-CM | POA: Diagnosis present

## 2019-02-06 DIAGNOSIS — T8130XA Disruption of wound, unspecified, initial encounter: Secondary | ICD-10-CM | POA: Diagnosis not present

## 2019-02-06 DIAGNOSIS — Z79899 Other long term (current) drug therapy: Secondary | ICD-10-CM | POA: Diagnosis not present

## 2019-02-06 DIAGNOSIS — D500 Iron deficiency anemia secondary to blood loss (chronic): Secondary | ICD-10-CM | POA: Diagnosis present

## 2019-02-06 DIAGNOSIS — J189 Pneumonia, unspecified organism: Secondary | ICD-10-CM

## 2019-02-06 DIAGNOSIS — Z8701 Personal history of pneumonia (recurrent): Secondary | ICD-10-CM

## 2019-02-06 DIAGNOSIS — R8271 Bacteriuria: Secondary | ICD-10-CM | POA: Diagnosis present

## 2019-02-06 DIAGNOSIS — T148XXA Other injury of unspecified body region, initial encounter: Secondary | ICD-10-CM | POA: Diagnosis not present

## 2019-02-06 DIAGNOSIS — J45909 Unspecified asthma, uncomplicated: Secondary | ICD-10-CM | POA: Diagnosis present

## 2019-02-06 DIAGNOSIS — T8132XA Disruption of internal operation (surgical) wound, not elsewhere classified, initial encounter: Secondary | ICD-10-CM | POA: Diagnosis not present

## 2019-02-06 DIAGNOSIS — B965 Pseudomonas (aeruginosa) (mallei) (pseudomallei) as the cause of diseases classified elsewhere: Secondary | ICD-10-CM | POA: Diagnosis present

## 2019-02-06 DIAGNOSIS — F418 Other specified anxiety disorders: Secondary | ICD-10-CM | POA: Diagnosis present

## 2019-02-06 DIAGNOSIS — I10 Essential (primary) hypertension: Secondary | ICD-10-CM | POA: Diagnosis present

## 2019-02-06 DIAGNOSIS — F329 Major depressive disorder, single episode, unspecified: Secondary | ICD-10-CM | POA: Diagnosis present

## 2019-02-06 DIAGNOSIS — T8131XA Disruption of external operation (surgical) wound, not elsewhere classified, initial encounter: Principal | ICD-10-CM | POA: Diagnosis present

## 2019-02-06 DIAGNOSIS — Z888 Allergy status to other drugs, medicaments and biological substances status: Secondary | ICD-10-CM | POA: Diagnosis not present

## 2019-02-06 DIAGNOSIS — Z87891 Personal history of nicotine dependence: Secondary | ICD-10-CM

## 2019-02-06 DIAGNOSIS — Z90710 Acquired absence of both cervix and uterus: Secondary | ICD-10-CM | POA: Diagnosis not present

## 2019-02-06 DIAGNOSIS — Z833 Family history of diabetes mellitus: Secondary | ICD-10-CM

## 2019-02-06 DIAGNOSIS — Z887 Allergy status to serum and vaccine status: Secondary | ICD-10-CM | POA: Diagnosis not present

## 2019-02-06 DIAGNOSIS — Z20828 Contact with and (suspected) exposure to other viral communicable diseases: Secondary | ICD-10-CM | POA: Diagnosis present

## 2019-02-06 DIAGNOSIS — K219 Gastro-esophageal reflux disease without esophagitis: Secondary | ICD-10-CM | POA: Diagnosis present

## 2019-02-06 DIAGNOSIS — Z6841 Body Mass Index (BMI) 40.0 and over, adult: Secondary | ICD-10-CM | POA: Diagnosis not present

## 2019-02-06 DIAGNOSIS — Z8249 Family history of ischemic heart disease and other diseases of the circulatory system: Secondary | ICD-10-CM | POA: Diagnosis not present

## 2019-02-06 LAB — COMPREHENSIVE METABOLIC PANEL
ALT: 13 U/L (ref 0–44)
AST: 19 U/L (ref 15–41)
Albumin: 2.4 g/dL — ABNORMAL LOW (ref 3.5–5.0)
Alkaline Phosphatase: 59 U/L (ref 38–126)
Anion gap: 9 (ref 5–15)
BUN: 11 mg/dL (ref 6–20)
CO2: 25 mmol/L (ref 22–32)
Calcium: 8.1 mg/dL — ABNORMAL LOW (ref 8.9–10.3)
Chloride: 105 mmol/L (ref 98–111)
Creatinine, Ser: 0.97 mg/dL (ref 0.44–1.00)
GFR calc Af Amer: >60 mL/min (ref >60)
GFR calc non Af Amer: >60 mL/min (ref >60)
Glucose, Bld: 120 mg/dL — ABNORMAL HIGH (ref 70–99)
Potassium: 4.1 mmol/L (ref 3.5–5.1)
Sodium: 139 mmol/L (ref 135–145)
Total Bilirubin: 0.4 mg/dL (ref 0.3–1.2)
Total Protein: 6.1 g/dL — ABNORMAL LOW (ref 6.5–8.1)

## 2019-02-06 LAB — CBC WITH DIFFERENTIAL/PLATELET
Abs Immature Granulocytes: 0.12 10*3/uL — ABNORMAL HIGH (ref 0.00–0.07)
Basophils Absolute: 0.1 10*3/uL (ref 0.0–0.1)
Basophils Relative: 1 %
Eosinophils Absolute: 0.5 10*3/uL (ref 0.0–0.5)
Eosinophils Relative: 4 %
HCT: 31.5 % — ABNORMAL LOW (ref 36.0–46.0)
Hemoglobin: 9.6 g/dL — ABNORMAL LOW (ref 12.0–15.0)
Immature Granulocytes: 1 %
Lymphocytes Relative: 15 %
Lymphs Abs: 1.8 10*3/uL (ref 0.7–4.0)
MCH: 31 pg (ref 26.0–34.0)
MCHC: 30.5 g/dL (ref 30.0–36.0)
MCV: 101.6 fL — ABNORMAL HIGH (ref 80.0–100.0)
Monocytes Absolute: 0.8 10*3/uL (ref 0.1–1.0)
Monocytes Relative: 7 %
Neutro Abs: 8.8 10*3/uL — ABNORMAL HIGH (ref 1.7–7.7)
Neutrophils Relative %: 72 %
Platelets: 449 10*3/uL — ABNORMAL HIGH (ref 150–400)
RBC: 3.1 MIL/uL — ABNORMAL LOW (ref 3.87–5.11)
RDW: 14 % (ref 11.5–15.5)
WBC: 12.2 10*3/uL — ABNORMAL HIGH (ref 4.0–10.5)
nRBC: 0.2 % (ref 0.0–0.2)

## 2019-02-06 LAB — URINALYSIS, ROUTINE W REFLEX MICROSCOPIC
Bilirubin Urine: NEGATIVE
Glucose, UA: NEGATIVE mg/dL
Hgb urine dipstick: NEGATIVE
Ketones, ur: NEGATIVE mg/dL
Leukocytes,Ua: NEGATIVE
Nitrite: NEGATIVE
Protein, ur: NEGATIVE mg/dL
Specific Gravity, Urine: 1.028 (ref 1.005–1.030)
pH: 6 (ref 5.0–8.0)

## 2019-02-06 LAB — LACTIC ACID, PLASMA
Lactic Acid, Venous: 2.1 mmol/L (ref 0.5–1.9)
Lactic Acid, Venous: 1.1 mmol/L (ref 0.5–1.9)
Lactic Acid, Venous: 1 mmol/L (ref 0.5–1.9)

## 2019-02-06 LAB — PROCALCITONIN: Procalcitonin: <0.1 ng/mL

## 2019-02-06 LAB — SARS CORONAVIRUS 2 (TAT 6-24 HRS): SARS Coronavirus 2: NEGATIVE

## 2019-02-06 MED ORDER — VANCOMYCIN HCL 10 G IV SOLR
2500.0000 mg | Freq: Once | INTRAVENOUS | Status: AC
  Administered 2019-02-06: 2500 mg via INTRAVENOUS
  Filled 2019-02-06: qty 2500

## 2019-02-06 MED ORDER — SODIUM CHLORIDE 0.9 % IV SOLN
100.0000 mg | Freq: Two times a day (BID) | INTRAVENOUS | Status: DC
  Administered 2019-02-06 – 2019-02-09 (×6): 100 mg via INTRAVENOUS
  Filled 2019-02-06 (×7): qty 100

## 2019-02-06 MED ORDER — VANCOMYCIN HCL IN DEXTROSE 1-5 GM/200ML-% IV SOLN: 1000.0000 mg | Freq: Once | INTRAVENOUS | Status: DC

## 2019-02-06 MED ORDER — OXYCODONE HCL 5 MG PO TABS
5.0000 mg | ORAL_TABLET | ORAL | Status: DC | PRN
  Administered 2019-02-06 – 2019-02-07 (×3): 5 mg via ORAL
  Filled 2019-02-06 (×3): qty 1

## 2019-02-06 MED ORDER — DEXTROSE 5 % IV SOLN
3.0000 g | INTRAVENOUS | Status: AC
  Administered 2019-02-07: 12:00:00 3 g via INTRAVENOUS
  Filled 2019-02-06: qty 3

## 2019-02-06 MED ORDER — B COMPLEX-C PO TABS
1.0000 | ORAL_TABLET | Freq: Every day | ORAL | Status: DC
  Administered 2019-02-07 – 2019-02-09 (×3): 1 via ORAL
  Filled 2019-02-06 (×5): qty 1

## 2019-02-06 MED ORDER — TRAZODONE HCL 50 MG PO TABS
50.0000 mg | ORAL_TABLET | Freq: Every day | ORAL | Status: DC
  Administered 2019-02-06 – 2019-02-09 (×4): 50 mg via ORAL
  Filled 2019-02-06 (×4): qty 1

## 2019-02-06 MED ORDER — BIOTIN 10000 MCG PO TABS: 10000.0000 ug | ORAL_TABLET | Freq: Every day | ORAL | Status: DC

## 2019-02-06 MED ORDER — ACETAMINOPHEN 650 MG RE SUPP: 650.0000 mg | Freq: Four times a day (QID) | RECTAL | Status: DC | PRN

## 2019-02-06 MED ORDER — PROMETHAZINE HCL 25 MG PO TABS
25.0000 mg | ORAL_TABLET | Freq: Three times a day (TID) | ORAL | Status: DC | PRN
  Administered 2019-02-06: 25 mg via ORAL
  Filled 2019-02-06: qty 1

## 2019-02-06 MED ORDER — AZELASTINE HCL 0.1 % NA SOLN: 2.0000 | Freq: Two times a day (BID) | NASAL | Status: DC | PRN

## 2019-02-06 MED ORDER — SODIUM CHLORIDE 0.9 % IV SOLN
2.0000 g | Freq: Once | INTRAVENOUS | Status: AC
  Administered 2019-02-06: 2 g via INTRAVENOUS
  Filled 2019-02-06: qty 20

## 2019-02-06 MED ORDER — PANTOPRAZOLE SODIUM 40 MG PO TBEC
40.0000 mg | DELAYED_RELEASE_TABLET | Freq: Two times a day (BID) | ORAL | Status: DC
  Administered 2019-02-06 – 2019-02-10 (×7): 40 mg via ORAL
  Filled 2019-02-06 (×8): qty 1

## 2019-02-06 MED ORDER — MONTELUKAST SODIUM 10 MG PO TABS
10.0000 mg | ORAL_TABLET | Freq: Every day | ORAL | Status: DC
  Administered 2019-02-06 – 2019-02-09 (×4): 10 mg via ORAL
  Filled 2019-02-06 (×4): qty 1

## 2019-02-06 MED ORDER — CHLORHEXIDINE GLUCONATE CLOTH 2 % EX PADS
6.0000 | MEDICATED_PAD | Freq: Once | CUTANEOUS | Status: AC
  Administered 2019-02-07: 08:00:00 6 via TOPICAL

## 2019-02-06 MED ORDER — GABAPENTIN 300 MG PO CAPS
300.0000 mg | ORAL_CAPSULE | Freq: Three times a day (TID) | ORAL | Status: DC
  Administered 2019-02-06 – 2019-02-10 (×10): 300 mg via ORAL
  Filled 2019-02-06 (×11): qty 1

## 2019-02-06 MED ORDER — MIRTAZAPINE 15 MG PO TABS
15.0000 mg | ORAL_TABLET | Freq: Every day | ORAL | Status: DC
  Administered 2019-02-06 – 2019-02-09 (×4): 15 mg via ORAL
  Filled 2019-02-06 (×4): qty 1

## 2019-02-06 MED ORDER — VENLAFAXINE HCL ER 150 MG PO CP24
150.0000 mg | ORAL_CAPSULE | Freq: Every day | ORAL | Status: DC
  Administered 2019-02-07 – 2019-02-10 (×4): 150 mg via ORAL
  Filled 2019-02-06 (×4): qty 1

## 2019-02-06 MED ORDER — BUPROPION HCL ER (XL) 300 MG PO TB24
300.0000 mg | ORAL_TABLET | Freq: Every day | ORAL | Status: DC
  Administered 2019-02-06 – 2019-02-10 (×5): 300 mg via ORAL
  Filled 2019-02-06 (×6): qty 1

## 2019-02-06 MED ORDER — LORATADINE 10 MG PO TABS: 10.0000 mg | ORAL_TABLET | Freq: Every day | ORAL | Status: DC | PRN

## 2019-02-06 MED ORDER — ENOXAPARIN SODIUM 40 MG/0.4ML ~~LOC~~ SOLN
40.0000 mg | SUBCUTANEOUS | Status: DC
  Administered 2019-02-06: 40 mg via SUBCUTANEOUS
  Filled 2019-02-06: qty 0.4

## 2019-02-06 MED ORDER — IPRATROPIUM-ALBUTEROL 0.5-2.5 (3) MG/3ML IN SOLN: 3.0000 mL | Freq: Four times a day (QID) | RESPIRATORY_TRACT | Status: DC | PRN

## 2019-02-06 MED ORDER — SODIUM CHLORIDE 0.9 % IV SOLN
1000.0000 mL | INTRAVENOUS | Status: DC
  Administered 2019-02-06: 12:00:00 1000 mL via INTRAVENOUS

## 2019-02-06 MED ORDER — ALBUTEROL SULFATE HFA 108 (90 BASE) MCG/ACT IN AERS: 2.0000 | INHALATION_SPRAY | Freq: Four times a day (QID) | RESPIRATORY_TRACT | Status: DC | PRN

## 2019-02-06 MED ORDER — B COMPLEX PO TABS: 1.0000 | ORAL_TABLET | Freq: Every day | ORAL | Status: DC

## 2019-02-06 MED ORDER — VITAMIN A 3 MG (10000 UNIT) PO CAPS
10000.0000 [IU] | ORAL_CAPSULE | Freq: Every day | ORAL | Status: DC
  Administered 2019-02-07 – 2019-02-09 (×3): 10000 [IU] via ORAL
  Filled 2019-02-06 (×5): qty 1

## 2019-02-06 MED ORDER — SODIUM CHLORIDE 0.9 % IV SOLN
INTRAVENOUS | Status: DC
  Administered 2019-02-06 – 2019-02-09 (×4): via INTRAVENOUS

## 2019-02-06 MED ORDER — IRBESARTAN 300 MG PO TABS
300.0000 mg | ORAL_TABLET | Freq: Every day | ORAL | Status: DC
  Administered 2019-02-06 – 2019-02-10 (×5): 300 mg via ORAL
  Filled 2019-02-06 (×6): qty 1

## 2019-02-06 MED ORDER — HYDROCODONE-ACETAMINOPHEN 5-325 MG PO TABS
1.0000 | ORAL_TABLET | Freq: Four times a day (QID) | ORAL | Status: DC | PRN
  Administered 2019-02-07 – 2019-02-08 (×3): 1 via ORAL
  Filled 2019-02-06 (×3): qty 1

## 2019-02-06 MED ORDER — CYCLOSPORINE 0.05 % OP EMUL
1.0000 [drp] | Freq: Two times a day (BID) | OPHTHALMIC | Status: DC
  Administered 2019-02-06 – 2019-02-10 (×7): 1 [drp] via OPHTHALMIC
  Filled 2019-02-06 (×9): qty 30

## 2019-02-06 MED ORDER — ADULT MULTIVITAMIN W/MINERALS CH
1.0000 | ORAL_TABLET | Freq: Every day | ORAL | Status: DC
  Administered 2019-02-07 – 2019-02-09 (×3): 1 via ORAL
  Filled 2019-02-06 (×4): qty 1

## 2019-02-06 MED ORDER — VITAMIN B-12 1000 MCG PO TABS
1000.0000 ug | ORAL_TABLET | Freq: Every day | ORAL | Status: DC
  Administered 2019-02-07 – 2019-02-09 (×3): 1000 ug via ORAL
  Filled 2019-02-06 (×4): qty 1

## 2019-02-06 MED ORDER — ACETAMINOPHEN 325 MG PO TABS
650.0000 mg | ORAL_TABLET | Freq: Four times a day (QID) | ORAL | Status: DC | PRN
  Administered 2019-02-06 – 2019-02-10 (×3): 650 mg via ORAL
  Filled 2019-02-06 (×3): qty 2

## 2019-02-06 MED ORDER — SODIUM CHLORIDE 0.9 % IV SOLN
3.0000 g | Freq: Four times a day (QID) | INTRAVENOUS | Status: DC
  Administered 2019-02-06 – 2019-02-09 (×11): 3 g via INTRAVENOUS
  Filled 2019-02-06 (×2): qty 3
  Filled 2019-02-06: qty 8
  Filled 2019-02-06 (×2): qty 3
  Filled 2019-02-06: qty 8
  Filled 2019-02-06 (×5): qty 3
  Filled 2019-02-06: qty 8

## 2019-02-06 MED ORDER — POLYETHYLENE GLYCOL 3350 17 G PO PACK: 17.0000 g | PACK | Freq: Every day | ORAL | Status: DC | PRN

## 2019-02-06 MED ORDER — ONDANSETRON HCL 4 MG PO TABS: 4.0000 mg | ORAL_TABLET | Freq: Three times a day (TID) | ORAL | Status: DC | PRN

## 2019-02-06 NOTE — Progress Notes (Signed)
A consult was received from an ED physician for vanc per pharmacy dosing.  The patient's profile has been reviewed for ht/wt/allergies/indication/available labs.   A one time order has been placed for vanc 2500mg .  Further antibiotics/pharmacy consults should be ordered by admitting physician if indicated.                       Thank you, Kara Mead 02/06/2019  11:46 AM

## 2019-02-06 NOTE — ED Triage Notes (Signed)
Pt had hernia repair and panniculectomy on 10/19. Pt states that in the last 3-4 days, her surgical wound has opened up and become infected. Pt states that they were trying to do wet to dry dressing on the wound, but it has continued to open.

## 2019-02-06 NOTE — Consult Note (Signed)
Reason for Consult: Wound dehiscence Referring Physician: Dr. Waymon Budge [name] is an 48 y.o. female.  HPI: Patient is presenting with a wound dehiscence after panniculectomy.  She was seen in the office on Friday and has failed outpatient management with wound care.  She has had a very difficult time managing the wound care and is in a significant amount of pain.  She says she has a history of poor wound healing from other surgeries.  She reports serous drainage from the wound.  She does not think she has had much in the way of fevers and chills.  She has been in touch with both myself and Dr. Elisabeth Cara over the last day or so.  She has been on Doxy doxycycline outpatient.  Past Medical History:  Diagnosis Date  . Anxiety   . Arthritis    joints  . Asthma   . Endometriosis   . Fat necrosis of breast    right  . GERD (gastroesophageal reflux disease)   . History of hiatal hernia    s/p repair few times  . History of suicide attempt 03/2017  . Hypertension   . MDD (major depressive disorder)   . Panniculitis   . Pneumonia 10/2018   in hospital with pneumonia  . PONV (postoperative nausea and vomiting)   . Psoriasis    treated with Humeria    Past Surgical History:  Procedure Laterality Date  . ACHILLES TENDON REPAIR Left 01-11-2015  @[facility]   . BREAST CYST EXCISION Right 01/24/2019   Procedure: EXCISIN OF RIGHT BREAST FAT NECROSIS;  Surgeon: Wallace Going, DO;  Location: [facility];  Service: Plastics;  Laterality: Right;  . BREAST REDUCTION SURGERY Bilateral 01/09/2016   Procedure: MAMMARY REDUCTION  (BREAST) BILATERAL;  Surgeon: Wallace Going, DO;  Location: [place];  Service: Plastics;  Laterality: Bilateral;  . CARPOMETACARPAL (CMC) FUSION OF THUMB Right 05-04-2015   @[facility]   . COMBINED HYSTEROSCOPY DIAGNOSTIC / D&C  06-23-2003     dr Ulanda Edison @[facility]   . D & C HYSTERSCOPY /  DX LAPAROSCOPY CONVERSION LAPAROTOMY LYSIS ADHESIONS WITH RIGHT SALPINECTOMY   12-26-2003    dr Quincy Simmonds  @[facility]   . LAPAROSCOPIC CHOLECYSTECTOMY  10-19-2002   dr Deon Pilling  @[facility]   . LAPAROSCOPIC GASTRIC BANDING  09-25-2003  @[facility]    WITH HIATAL HERNIA REPAIR  . LAPAROSCOPIC LYSIS OF ADHESIONS N/A 01/24/2019   Procedure: LAPAROSCOPY WITH LYSIS OF ADHESIONS;  Surgeon: Johnathan Hausen, MD;  Location: [facility];  Service: General;  Laterality: N/A;  . LAPAROSCOPIC REPAIR AND REMOVAL OF GASTRIC BAND  02-06-2006  @[facility]   . LAPAROTOMY LYSIS ADHESIONS , UTERINE BIOPSY  01-31-2000   dr Corinna Capra  @[facility]   . OPEN WOUND [place] , REMOVAL Valetta Fuller BODY'S  05-10-2001   dr Deon Pilling  @[facility]   . PANNICULECTOMY N/A 01/24/2019   Procedure: PANNICULECTOMY;  Surgeon: Wallace Going, DO;  Location[facility];  Service: Plastics;  Laterality: N/A;  . REPAIR RECURRENT  HIATAL HERNIA   04-23-2004  @[facility]   . TAKEDOWN RECURRENT HIATAL HERNIA FROM PREVIOUS MESH REPAIR OF DIAPHRAGM/ REPAIR HIATAL HERNIA AND THE GASTROPEXY  11/05/2004  . TONSILLECTOMY  age 52  . TOTAL ABDOMINAL HYSTERECTOMY W/ BILATERAL SALPINGOOPHORECTOMY  12-23-2005   dr soper   W/  BILATERAL URETEROLYSIS AND EXTENSIVE ABD. LYSIS ADHESIONS  . UMBILICAL HERNIA REPAIR  12/27/2009   LAPAROSCOPY TAKEDOWN INCARCERATED UMBILICAL HERNIA WITH REPAIR/ OPEN REPAIR COMPLEX LOWER INCISIONAL HERNIA    Family History  Problem Relation Age  of Onset  . Cancer Maternal Aunt        melanoma  . Diabetes Father   . Heart attack Father 52    Social History:  reports that she quit smoking about 3 years ago. Her smoking use included cigarettes. She has a 20.00 pack-year smoking history. She has never used smokeless tobacco. She reports that she does not drink alcohol or use drugs.  Allergies:  Allergies  Allergen Reactions  . Influenza Vaccines     States her "arm had a knot and turned red"  . Lisinopril-Hydrochlorothiazide     welts    No current facility-administered medications on file prior to encounter.    Current Outpatient Medications on  File Prior to Encounter  Medication Sig Dispense Refill  . acetaminophen (TYLENOL) 500 MG tablet Take 1,000 mg by mouth every 4 (four) hours as needed for fever or headache.    . albuterol (PROVENTIL HFA;VENTOLIN HFA) 108 (90 BASE) MCG/ACT inhaler Inhale 2 puffs into the lungs every 6 (six) hours as needed. For shortness of breath    . ALPRAZolam (XANAX) 0.5 MG tablet Take 0.5 mg by mouth 3 (three) times daily as needed for anxiety.     Marland Kitchen azelastine (ASTELIN) 137 MCG/SPRAY nasal spray Place 2 sprays into both nostrils 2 (two) times daily as needed for rhinitis. Use in each nostril as directed    . b complex vitamins tablet Take 1 tablet by mouth daily.    . Biotin 65790 MCG TABS Take 10,000 mcg by mouth daily.    Marland Kitchen buPROPion (WELLBUTRIN XL) 300 MG 24 hr tablet Take 300 mg by mouth daily.    . cephALEXin (KEFLEX) 500 MG capsule Take 1 capsule (500 mg total) by mouth 4 (four) times daily for 5 days. 20 capsule 0  . cetirizine (ZYRTEC) 10 MG tablet Take 10 mg by mouth daily as needed for allergies.     Marland Kitchen doxycycline (VIBRA-TABS) 100 MG tablet Take 1 tablet (100 mg total) by mouth 2 (two) times daily for 7 days. 14 tablet 0  . gabapentin (NEURONTIN) 300 MG capsule Take 300 mg by mouth 3 (three) times daily.    Marland Kitchen HYDROcodone-acetaminophen (NORCO) 5-325 MG tablet Take 1 tablet by mouth every 6 (six) hours as needed for up to 5 days for severe pain. 20 tablet 0  . ipratropium-albuterol (DUONEB) 0.5-2.5 (3) MG/3ML SOLN Take 3 mLs by nebulization every 6 (six) hours as needed. (Patient taking differently: Take 3 mLs by nebulization every 6 (six) hours as needed (asthma). ) 360 mL 0  . mirtazapine (REMERON) 15 MG tablet Take 1 tablet (15 mg total) by mouth at bedtime. For mood control 30 tablet 0  . montelukast (SINGULAIR) 10 MG tablet Take 10 mg by mouth at bedtime.     . Multiple Vitamin (MULTIVITAMIN) tablet Take 1 tablet by mouth daily.    Marland Kitchen olmesartan (BENICAR) 40 MG tablet Take 40 mg by mouth every  morning.     . ondansetron (ZOFRAN) 4 MG tablet Take 1 tablet (4 mg total) by mouth every 8 (eight) hours as needed for nausea or vomiting. 20 tablet 0  . pantoprazole (PROTONIX) 40 MG tablet Take 40 mg by mouth 2 (two) times daily.     . polyethylene glycol (MIRALAX / GLYCOLAX) 17 g packet Take 17 g by mouth daily as needed for mild constipation. 14 each 0  . promethazine (PHENERGAN) 25 MG tablet Take 25 mg by mouth every 8 (eight) hours as needed for  nausea or vomiting.     . RESTASIS 0.05 % ophthalmic emulsion Place 1 drop into both eyes 2 (two) times daily.    . traZODone (DESYREL) 50 MG tablet Take 1 tablet (50 mg total) by mouth at bedtime as needed for sleep. For sleep (Patient taking differently: Take 50 mg by mouth at bedtime. For sleep) 30 tablet 0  . venlafaxine XR (EFFEXOR-XR) 150 MG 24 hr capsule Take 150 mg by mouth daily with breakfast.     . vitamin A 8000 UNIT capsule Take 8,000 Units by mouth daily.    . vitamin B-12 (CYANOCOBALAMIN) 1000 MCG tablet Take 1,000 mcg by mouth daily.       Results for orders placed or performed during the hospital encounter of 02/06/19 (from the past 48 hour(s))  Lactic acid, plasma     Status: Abnormal   Collection Time: 02/06/19 11:23 AM  Result Value Ref Range   Lactic Acid, Venous 2.1 (HH) 0.5 - 1.9 mmol/L    Comment: CRITICAL RESULT CALLED TO, READ BACK BY AND VERIFIED WITH: T SMITH,RN 02/06/19 1234 RHOLMES Performed at [facility], [address], [place], [place] [address]   Comprehensive metabolic panel     Status: Abnormal   Collection Time: 02/06/19 11:23 AM  Result Value Ref Range   Sodium 139 135 - 145 mmol/L   Potassium 4.1 3.5 - 5.1 mmol/L   Chloride 105 98 - 111 mmol/L   CO2 25 22 - 32 mmol/L   Glucose, Bld 120 (H) 70 - 99 mg/dL   BUN 11 6 - 20 mg/dL   Creatinine, Ser 2.72 0.44 - 1.00 mg/dL   Calcium 8.1 (L) 8.9 - 10.3 mg/dL   Total Protein 6.1 (L) 6.5 - 8.1 g/dL   Albumin 2.4 (L) 3.5 - 5.0 g/dL    AST 19 15 - 41 U/L   ALT 13 0 - 44 U/L   Alkaline Phosphatase 59 38 - 126 U/L   Total Bilirubin 0.4 0.3 - 1.2 mg/dL   GFR calc non Af Amer >60 >60 mL/min   GFR calc Af Amer >60 >60 mL/min   Anion gap 9 5 - 15    Comment: Performed at [facility], [address] [place], [place] [address]  CBC WITH DIFFERENTIAL     Status: Abnormal   Collection Time: 02/06/19 11:23 AM  Result Value Ref Range   WBC 12.2 (H) 4.0 - 10.5 K/uL   RBC 3.10 (L) 3.87 - 5.11 MIL/uL   Hemoglobin 9.6 (L) 12.0 - 15.0 g/dL   HCT 40.3 (L) 47.4 - 25.9 %   MCV 101.6 (H) 80.0 - 100.0 fL   MCH 31.0 26.0 - 34.0 pg   MCHC 30.5 30.0 - 36.0 g/dL   RDW 56.3 87.5 - 64.3 %   Platelets 449 (H) 150 - 400 K/uL   nRBC 0.2 0.0 - 0.2 %   Neutrophils Relative % 72 %   Neutro Abs 8.8 (H) 1.7 - 7.7 K/uL   Lymphocytes Relative 15 %   Lymphs Abs 1.8 0.7 - 4.0 K/uL   Monocytes Relative 7 %   Monocytes Absolute 0.8 0.1 - 1.0 K/uL   Eosinophils Relative 4 %   Eosinophils Absolute 0.5 0.0 - 0.5 K/uL   Basophils Relative 1 %   Basophils Absolute 0.1 0.0 - 0.1 K/uL   Immature Granulocytes 1 %   Abs Immature Granulocytes 0.12 (H) 0.00 - 0.07 K/uL    Comment: Performed at [facility], [address] [place], [place] [address]    No results found.   Blood pressure 128/84, pulse 94, temperature 98.1 F (36.7 C), temperature source Oral, resp. rate 20, height (S) 5\' 11"  (1.803 m), weight (S) (!) 159.8 kg, SpO2 99 %. Physical Exam  General: Obese female in no acute distress.  Alert and oriented. Abdomen: Abdomen is soft.  The majority of her lower transverse wound has dehisced.  There is mild serous drainage.  There is mild surrounding erythema.  Assessment/Plan: Patient presents with wound dehiscence after panniculectomy.  Would recommend admission for pain control, help with wound care, and potential surgical intervention this week.  While in the hospital would recommend moist saline Kerlix  packing covered with multiple ABD pads.  Also recommend abdominal binder.  Please keep head of bed elevated slightly to avoid putting tension on the wound.  Dressing needs to be changed once or twice a day but the surrounding ABD pads can be changed as needed for drainage.  More than likely will have a wound VAC applied this coming week to help with this.  Agree with antibiotic coverage for now however I suspect this is more of a wound healing issue than an infectious issue.  Please call with any questions.  Also we will plan to make her n.p.o. at midnight tonight in the event that we can find time on the operating room schedule for her tomorrow.  Until then she can have a normal diet and resume her home medications.  Allena Napoleonace S Kemya Shed 02/06/2019, 1:49 PM

## 2019-02-06 NOTE — ED Provider Notes (Signed)
[facility]-EMERGENCY DEPT Provider Note   CSN: [id number] Arrival date & time: 02/06/19  1048     History   Chief Complaint Chief Complaint  Patient presents with  . Wound Dehiscence  . Wound Infection    HPI [name] is a 48 y.o. female.     48 year old female who presents with wound infection.  Patient had a panniculectomy 1.5 weeks ago and the wound began to dehisce several days ago.  Today she endorses chills with purulent drainage from her abdominal incision.  Nausea but no vomiting.  Pain is been sharp and persistent and worse with movement.  Denies any cough or congestion.  Called her surgeon and was told to come here     Past Medical History:  Diagnosis Date  . Anxiety   . Arthritis    joints  . Asthma   . Endometriosis   . Fat necrosis of breast    right  . GERD (gastroesophageal reflux disease)   . History of hiatal hernia    s/p repair few times  . History of suicide attempt 03/2017  . Hypertension   . MDD (major depressive disorder)   . Panniculitis   . Pneumonia 10/2018   in hospital with pneumonia  . PONV (postoperative nausea and vomiting)   . Psoriasis    treated with Humeria    Patient Active Problem List   Diagnosis Date Noted  . Pneumonia 09/23/2018  . Depression with anxiety 09/23/2018  . Psoriasis 09/23/2018  . Tachyarrhythmia   . Ventral hernia 06/09/2018  . Breast, fat necrosis 05/11/2018  . Posttraumatic stress disorder 04/05/2018  . Panniculitis 03/10/2018  . Major depressive disorder, single episode, unspecified 04/02/2017  . MDD (major depressive disorder), recurrent episode, severe (HCC) 03/24/2017  . Symptomatic mammary hypertrophy 01/09/2016  . Nicotine dependence 12/25/2015    Past Surgical History:  Procedure Laterality Date  . ACHILLES TENDON REPAIR Left 01-11-2015  @[facility]   . BREAST CYST EXCISION Right 01/24/2019   Procedure: EXCISIN OF RIGHT BREAST FAT NECROSIS;  Surgeon: 01/26/2019, DO;  Location: [facility];  Service: Plastics;  Laterality: Right;  . BREAST REDUCTION SURGERY Bilateral 01/09/2016   Procedure: MAMMARY REDUCTION  (BREAST) BILATERAL;  Surgeon: 03/10/2016, DO;  Location: [facility];  Service: Plastics;  Laterality: Bilateral;  . CARPOMETACARPAL (CMC) FUSION OF THUMB Right 05-04-2015   @[facility]   . COMBINED HYSTEROSCOPY DIAGNOSTIC / D&C  06-23-2003     dr @[facility]   . D & C HYSTERSCOPY /  DX LAPAROSCOPY CONVERSION LAPAROTOMY LYSIS ADHESIONS WITH RIGHT SALPINECTOMY  12-26-2003    dr Ambrose Mantle  @[facility]   . LAPAROSCOPIC CHOLECYSTECTOMY  10-19-2002   dr 12-28-2003  @[facility]   . LAPAROSCOPIC GASTRIC BANDING  09-25-2003  @[facility]    WITH HIATAL HERNIA REPAIR  . LAPAROSCOPIC LYSIS OF ADHESIONS N/A 01/24/2019   Procedure: LAPAROSCOPY WITH LYSIS OF ADHESIONS;  Surgeon: 10-21-2002, MD;  Location: [facility];  Service: General;  Laterality: N/A;  . LAPAROSCOPIC REPAIR AND REMOVAL OF GASTRIC BAND  02-06-2006  @[facility]   . LAPAROTOMY LYSIS ADHESIONS , UTERINE BIOPSY  01-31-2000   dr 09-27-2003  @[facility]   . OPEN WOUND DEBRIDEMENT ANTERIOR ABDOMINAL WALL , REMOVAL BODY'S  05-10-2001   dr Luretha Murphy  @[facility]   . PANNICULECTOMY N/A 01/24/2019   Procedure: PANNICULECTOMY;  Surgeon: , DO;  Location[facility];  Service: Plastics;  Laterality: N/A;  . REPAIR RECURRENT  HIATAL HERNIA   04-23-2004  @[facility]   .  TAKEDOWN RECURRENT HIATAL HERNIA FROM PREVIOUS MESH REPAIR OF DIAPHRAGM/ REPAIR HIATAL HERNIA AND THE GASTROPEXY  11/05/2004  . TONSILLECTOMY  age 62  . TOTAL ABDOMINAL HYSTERECTOMY W/ BILATERAL SALPINGOOPHORECTOMY  12-23-2005   dr soper   W/  BILATERAL URETEROLYSIS AND EXTENSIVE ABD. LYSIS ADHESIONS  . UMBILICAL HERNIA REPAIR  12/27/2009   LAPAROSCOPY TAKEDOWN INCARCERATED UMBILICAL HERNIA WITH REPAIR/ OPEN REPAIR COMPLEX LOWER INCISIONAL HERNIA     OB History   No obstetric history on file.      Home Medications    Prior to Admission medications   Medication Sig  Start Date End Date Taking? Authorizing Provider  acetaminophen (TYLENOL) 500 MG tablet Take 1,000 mg by mouth every 4 (four) hours as needed for fever or headache.    [provider]  albuterol (PROVENTIL HFA;VENTOLIN HFA) 108 (90 BASE) MCG/ACT inhaler Inhale 2 puffs into the lungs every 6 (six) hours as needed. For shortness of breath    [provider]  ALPRAZolam (XANAX) 0.5 MG tablet Take 0.5 mg by mouth 3 (three) times daily as needed for anxiety.  05/06/18   [provider]  azelastine (ASTELIN) 137 MCG/SPRAY nasal spray Place 2 sprays into both nostrils 2 (two) times daily as needed for rhinitis. Use in each nostril as directed    [provider]  b complex vitamins tablet Take 1 tablet by mouth daily.    [provider]  Biotin 10000 MCG TABS Take 10,000 mcg by mouth daily.    [provider]  buPROPion (WELLBUTRIN XL) 300 MG 24 hr tablet Take 300 mg by mouth daily.    [provider]  cephALEXin (KEFLEX) 500 MG capsule Take 1 capsule (500 mg total) by mouth 4 (four) times daily for 5 days. 02/01/19 02/06/19  Dillingham, [name], DO  cetirizine (ZYRTEC) 10 MG tablet Take 10 mg by mouth daily as needed for allergies.     [provider]  doxycycline (VIBRA-TABS) 100 MG tablet Take 1 tablet (100 mg total) by mouth 2 (two) times daily for 7 days. 02/03/19 02/10/19  Scheeler, Carola Rhine, PA-C  HYDROcodone-acetaminophen (NORCO) 5-325 MG tablet Take 1 tablet by mouth every 6 (six) hours as needed for up to 5 days for severe pain. 02/01/19 02/06/19  Dillingham, [name], DO  ipratropium-albuterol (DUONEB) 0.5-2.5 (3) MG/3ML SOLN Take 3 mLs by nebulization every 6 (six) hours as needed. Patient taking differently: Take 3 mLs by nebulization every 6 (six) hours as needed (asthma).  09/25/18   Raiford Noble Latif, DO  mirtazapine (REMERON) 15 MG tablet Take 1 tablet (15 mg total) by mouth at bedtime. For mood control 03/27/17   Money,  Darnelle Maffucci B, FNP  montelukast (SINGULAIR) 10 MG tablet Take 10 mg by mouth at bedtime.  02/04/17   [provider]  Multiple Vitamin (MULTIVITAMIN) tablet Take 1 tablet by mouth daily.    [provider]  olmesartan (BENICAR) 40 MG tablet Take 40 mg by mouth every morning.  04/13/17   [provider]  ondansetron (ZOFRAN) 4 MG tablet Take 1 tablet (4 mg total) by mouth every 8 (eight) hours as needed for nausea or vomiting. 01/11/19   Scheeler, Carola Rhine, PA-C  pantoprazole (PROTONIX) 40 MG tablet Take 40 mg by mouth 2 (two) times daily.     [provider]  polyethylene glycol (MIRALAX / GLYCOLAX) 17 g packet Take 17 g by mouth daily as needed for mild constipation. 09/25/18   Kerney Elbe, DO  promethazine (PHENERGAN) 25 MG tablet Take 25 mg by mouth every 8 (eight) hours as needed for nausea or vomiting.  12/31/17   [provider]  traZODone (DESYREL) 50 MG tablet Take 1 tablet (50 mg total) by mouth at bedtime as needed for sleep. For sleep Patient taking differently: Take 50 mg by mouth at bedtime. For sleep 03/27/17   Money, Gerlene Burdockravis B, FNP  venlafaxine XR (EFFEXOR-XR) 150 MG 24 hr capsule Take 150 mg by mouth daily with breakfast.  05/04/18   [provider]  vitamin A 8000 UNIT capsule Take 8,000 Units by mouth daily.    [provider]  vitamin B-12 (CYANOCOBALAMIN) 1000 MCG tablet Take 1,000 mcg by mouth daily.    [provider]    Family History Family History  Problem Relation Age of Onset  . Cancer Maternal Aunt        melanoma  . Diabetes Father   . Heart attack Father [id number]    Social History Social History   Tobacco Use  . Smoking status: Former Smoker    Packs/day: 1.00    Years: 20.00    Pack years: 20.00    Types: Cigarettes    Quit date: 05/20/2015    Years since quitting: 3.7  . Smokeless tobacco: Never Used  Substance Use Topics  . Alcohol use: No  . Drug use: Never     Allergies    Influenza vaccines and Lisinopril-hydrochlorothiazide   Review of Systems Review of Systems  All other systems reviewed and are negative.    Physical Exam Updated Vital Signs BP (!) 125/91   Pulse (!) 104   Temp 98.1 F (36.7 C) (Oral)   Resp 20   SpO2 97%   Physical Exam Vitals signs and nursing note reviewed.  Constitutional:      General: She is not in acute distress.    Appearance: Normal appearance. She is well-developed. She is not toxic-appearing.  HENT:     Head: Normocephalic and atraumatic.  Eyes:     General: Lids are normal.     Conjunctiva/sclera: Conjunctivae normal.     Pupils: Pupils are equal, round, and reactive to light.  Neck:     Musculoskeletal: Normal range of motion and neck supple.     Thyroid: No thyroid mass.     Trachea: No tracheal deviation.  Cardiovascular:     Rate and Rhythm: Normal rate and regular rhythm.     Heart sounds: Normal heart sounds. No murmur. No gallop.   Pulmonary:     Effort: Pulmonary effort is normal. No respiratory distress.     Breath sounds: Normal breath sounds. No stridor. No decreased breath sounds, wheezing, rhonchi or rales.  Abdominal:     General: Bowel sounds are normal. There is no distension.     Palpations: Abdomen is soft.     Tenderness: There is no abdominal tenderness. There is no rebound.    Musculoskeletal: Normal range of motion.        General: No tenderness.  Skin:    General: Skin is warm and dry.     Findings: No abrasion or rash.  Neurological:     Mental Status: She is alert and oriented to person, place, and time.     GCS: GCS eye subscore is 4. GCS verbal subscore is 5. GCS motor subscore is 6.     Cranial Nerves: No cranial nerve deficit.     Sensory: No sensory deficit.  Psychiatric:  Speech: Speech normal.        Behavior: Behavior normal.      ED Treatments / Results  Labs (all labs ordered are listed, but only abnormal results are displayed) Labs Reviewed   CULTURE, BLOOD (ROUTINE X 2)  CULTURE, BLOOD (ROUTINE X 2)  URINE CULTURE  SARS CORONAVIRUS 2 (TAT 6-24 HRS)  LACTIC ACID, PLASMA  LACTIC ACID, PLASMA  COMPREHENSIVE METABOLIC PANEL  CBC WITH DIFFERENTIAL/PLATELET  URINALYSIS, ROUTINE W REFLEX MICROSCOPIC  I-STAT BETA HCG BLOOD, ED ([facility], [facility], [facility] ONLY)    EKG None  Radiology No results found.  Procedures Procedures (including critical care time)  Medications Ordered in ED Medications  0.9 %  sodium chloride infusion (has no administration in time range)  vancomycin (VANCOCIN) IVPB 1000 mg/200 mL premix (has no administration in time range)  cefTRIAXone (ROCEPHIN) 2 g in sodium chloride 0.9 % 100 mL IVPB (has no administration in time range)     Initial Impression / Assessment and Plan / ED Course  I have reviewed the triage vital signs and the nursing notes.  Pertinent labs & imaging results that were available during my care of the patient were reviewed by me and considered in my medical decision making (see chart for details).        Patient with postoperative infection at this time.  No evidence of necrotizing fasciitis.  Patient started on IV antibiotics and discussed with Dr. Arita Miss who is on-call for patient's plastic surgeon.  Will come to see and admit  Final Clinical Impressions(s) / ED Diagnoses   Final diagnoses:  None    ED Discharge Orders    None       Lorre Nick, MD 02/06/19 1128

## 2019-02-06 NOTE — ED Notes (Signed)
ED TO INPATIENT HANDOFF REPORT  ED Nurse Name and Phone #: [phone]  S Name/Age/Gender [name] 48 y.o. female Room/Bed: WA21/WA21  Code Status   Code Status: Prior  Home/SNF/Other Home Patient oriented to: self, place, time and situation Is this baseline? Yes   Triage Complete: Triage complete  Chief Complaint Open Wound  Triage Note Pt had hernia repair and panniculectomy on 10/19. Pt states that in the last 3-4 days, her surgical wound has opened up and become infected. Pt states that they were trying to do wet to dry dressing on the wound, but it has continued to open.    Allergies Allergies  Allergen Reactions  . Influenza Vaccines     States her "arm had a knot and turned red"  . Lisinopril-Hydrochlorothiazide     welts    Level of Care/Admitting Diagnosis ED Disposition    ED Disposition Condition Comment   Admit  [facility] [[id number]]  Level of Care: Med-Surg [16]  Covid Evaluation: Asymptomatic Screening Protocol (No Symptoms)  Diagnosis: Wound dehiscence [369010]  Admitting Physician: Clydia LlanoHANDRA, ARAVIND [[id number]]  Attending Physician: Clydia LlanoHANDRA, ARAVIND [[id number]]  Estimated length of stay: inpatient only procedure  Certification:: I certify this patient will need inpatient services for at least 2 midnights  PT Class (Do Not Modify): Inpatient [101]  PT Acc Code (Do Not Modify): Private [1]       B Medical/Surgery History Past Medical History:  Diagnosis Date  . Anxiety   . Arthritis    joints  . Asthma   . Endometriosis   . Fat necrosis of breast    right  . GERD (gastroesophageal reflux disease)   . History of hiatal hernia    s/p repair few times  . History of suicide attempt 03/2017  . Hypertension   . MDD (major depressive disorder)   . Panniculitis   . Pneumonia 10/2018   in hospital with pneumonia  . PONV (postoperative nausea and vomiting)   . Psoriasis    treated with Humeria   Past  Surgical History:  Procedure Laterality Date  . ACHILLES TENDON REPAIR Left 01-11-2015  @[facility]   . BREAST CYST EXCISION Right 01/24/2019   Procedure: EXCISIN OF RIGHT BREAST FAT NECROSIS;  Surgeon: Peggye Formillingham, Claire S, DO;  Location: [facility];  Service: Plastics;  Laterality: Right;  . BREAST REDUCTION SURGERY Bilateral 01/09/2016   Procedure: MAMMARY REDUCTION  (BREAST) BILATERAL;  Surgeon: Peggye Formlaire S Dillingham, DO;  Location: [facility];  Service: Plastics;  Laterality: Bilateral;  . CARPOMETACARPAL (CMC) FUSION OF THUMB Right 05-04-2015   @[facility]   . COMBINED HYSTEROSCOPY DIAGNOSTIC / D&C  06-23-2003     dr Ambrose Mantlehenley @[facility]   . D & C HYSTERSCOPY /  DX LAPAROSCOPY CONVERSION LAPAROTOMY LYSIS ADHESIONS WITH RIGHT SALPINECTOMY  12-26-2003    dr Edward Jollysilva  @[facility]   . LAPAROSCOPIC CHOLECYSTECTOMY  10-19-2002   dr Orson Slickbowman  @[facility]   . LAPAROSCOPIC GASTRIC BANDING  09-25-2003  @[facility]    WITH HIATAL HERNIA REPAIR  . LAPAROSCOPIC LYSIS OF ADHESIONS N/A 01/24/2019   Procedure: LAPAROSCOPY WITH LYSIS OF ADHESIONS;  Surgeon: Luretha MurphyMartin, Matthew, MD;  Location: [facility];  Service: General;  Laterality: N/A;  . LAPAROSCOPIC REPAIR AND REMOVAL OF GASTRIC BAND  02-06-2006  @[facility]   . LAPAROTOMY LYSIS ADHESIONS , UTERINE BIOPSY  01-31-2000   dr Rana Snarelowe  @[facility]   . OPEN WOUND DEBRIDEMENT ANTERIOR ABDOMINAL WALL , REMOVAL Oswald HillockFORGEIN BODY'S  05-10-2001   dr Orson Slickbowman  @[facility]   . PANNICULECTOMY N/A  01/24/2019   Procedure: PANNICULECTOMY;  Surgeon: Wallace Going, DO;  Location: [facility];  Service: Plastics;  Laterality: N/A;  . REPAIR RECURRENT  HIATAL HERNIA   04-23-2004  @[facility]   . TAKEDOWN RECURRENT HIATAL HERNIA FROM PREVIOUS MESH REPAIR OF DIAPHRAGM/ REPAIR HIATAL HERNIA AND THE GASTROPEXY  11/05/2004  . TONSILLECTOMY  age 64  . TOTAL ABDOMINAL HYSTERECTOMY W/ BILATERAL SALPINGOOPHORECTOMY  12-23-2005   dr soper   W/  BILATERAL URETEROLYSIS AND EXTENSIVE ABD. LYSIS ADHESIONS  . UMBILICAL HERNIA REPAIR  12/27/2009   LAPAROSCOPY TAKEDOWN  INCARCERATED UMBILICAL HERNIA WITH REPAIR/ OPEN REPAIR COMPLEX LOWER INCISIONAL HERNIA     A IV Location/Drains/Wounds Patient Lines/Drains/Airways Status   Active Line/Drains/Airways    Name:   Placement date:   Placement time:   Site:   Days:   Peripheral IV 02/06/19 Left;Upper Arm   02/06/19    1148    Arm   less than 1   Peripheral IV 02/06/19 Left Forearm   02/06/19    1209    Forearm   less than 1   Incision (Closed) 01/24/19 Abdomen Other (Comment)   01/24/19    1051     13          Intake/Output Last 24 hours No intake or output data in the 24 hours ending 02/06/19 1442  Labs/Imaging Results for orders placed or performed during the hospital encounter of 02/06/19 (from the past 48 hour(s))  Lactic acid, plasma     Status: Abnormal   Collection Time: 02/06/19 11:23 AM  Result Value Ref Range   Lactic Acid, Venous 2.1 (HH) 0.5 - 1.9 mmol/L    Comment: CRITICAL RESULT CALLED TO, READ BACK BY AND VERIFIED WITH: T SMITH,RN 02/06/19 1234 RHOLMES Performed at [facility], [place] [address], [place], [place] [address]   Comprehensive metabolic panel     Status: Abnormal   Collection Time: 02/06/19 11:23 AM  Result Value Ref Range   Sodium 139 135 - 145 mmol/L   Potassium 4.1 3.5 - 5.1 mmol/L   Chloride 105 98 - 111 mmol/L   CO2 25 22 - 32 mmol/L   Glucose, Bld 120 (H) 70 - 99 mg/dL   BUN 11 6 - 20 mg/dL   Creatinine, Ser 0.97 0.44 - 1.00 mg/dL   Calcium 8.1 (L) 8.9 - 10.3 mg/dL   Total Protein 6.1 (L) 6.5 - 8.1 g/dL   Albumin 2.4 (L) 3.5 - 5.0 g/dL   AST 19 15 - 41 U/L   ALT 13 0 - 44 U/L   Alkaline Phosphatase 59 38 - 126 U/L   Total Bilirubin 0.4 0.3 - 1.2 mg/dL   GFR calc non Af Amer >60 >60 mL/min   GFR calc Af Amer >60 >60 mL/min   Anion gap 9 5 - 15    Comment: Performed at [facility], [place] [address], [place], [place] [address]  CBC WITH DIFFERENTIAL     Status: Abnormal   Collection Time: 02/06/19 11:23 AM  Result Value  Ref Range   WBC 12.2 (H) 4.0 - 10.5 K/uL   RBC 3.10 (L) 3.87 - 5.11 MIL/uL   Hemoglobin 9.6 (L) 12.0 - 15.0 g/dL   HCT 31.5 (L) 36.0 - 46.0 %   MCV 101.6 (H) 80.0 - 100.0 fL   MCH 31.0 26.0 - 34.0 pg   MCHC 30.5 30.0 - 36.0 g/dL   RDW 14.0 11.5 - 15.5 %   Platelets 449 (H) 150 -  400 K/uL   nRBC 0.2 0.0 - 0.2 %   Neutrophils Relative % 72 %   Neutro Abs 8.8 (H) 1.7 - 7.7 K/uL   Lymphocytes Relative 15 %   Lymphs Abs 1.8 0.7 - 4.0 K/uL   Monocytes Relative 7 %   Monocytes Absolute 0.8 0.1 - 1.0 K/uL   Eosinophils Relative 4 %   Eosinophils Absolute 0.5 0.0 - 0.5 K/uL   Basophils Relative 1 %   Basophils Absolute 0.1 0.0 - 0.1 K/uL   Immature Granulocytes 1 %   Abs Immature Granulocytes 0.12 (H) 0.00 - 0.07 K/uL    Comment: Performed at [facility], [address] [place], [place] [address]   No results found.  Pending Labs Unresulted Labs (From admission, onward)    Start     Ordered   02/06/19 1426  Lactic acid, plasma  STAT Now then every 3 hours,   R (with STAT occurrences)     02/06/19 1426   02/06/19 1124  SARS CORONAVIRUS 2 (TAT 6-24 HRS) Nasopharyngeal Nasopharyngeal Swab  (Asymptomatic/Tier 2 Patients Labs)  Once,   STAT    Question Answer Comment  Is this test for diagnosis or screening Screening   Symptomatic for COVID-19 as defined by CDC No   Hospitalized for COVID-19 No   Admitted to ICU for COVID-19 No   Previously tested for COVID-19 Yes   Resident in a congregate (group) care setting No   Employed in healthcare setting No   Pregnant No      02/06/19 1123   02/06/19 1123  Blood Culture (routine x 2)  BLOOD CULTURE X 2,   STAT     02/06/19 1123   02/06/19 1123  Urinalysis, Routine w reflex microscopic  ONCE - STAT,   STAT     02/06/19 1123   02/06/19 1123  Urine culture  ONCE - STAT,   STAT     02/06/19 1123   Signed and Held  Hemoglobin A1c  Tomorrow morning,   R     Signed and Held   Signed and Held  Basic metabolic panel   Tomorrow morning,   R     Signed and Held   Signed and Held  CBC  Tomorrow morning,   R     Signed and Held          Vitals/Pain Today's Vitals   02/06/19 1327 02/06/19 1330 02/06/19 1345 02/06/19 1400  BP: 128/84 136/87  130/81  Pulse: 94 96 98 100  Resp: 20 18 (!) 24 14  Temp:      TempSrc:      SpO2: 99% 98% 98% 98%  Weight:      Height:      PainSc:        Isolation Precautions No active isolations  Medications Medications  0.9 %  sodium chloride infusion (1,000 mLs Intravenous New Bag/Given 02/06/19 1151)  Ampicillin-Sulbactam (UNASYN) 3 g in sodium chloride 0.9 % 100 mL IVPB (has no administration in time range)  doxycycline (VIBRAMYCIN) 100 mg in sodium chloride 0.9 % 250 mL IVPB (has no administration in time range)  cefTRIAXone (ROCEPHIN) 2 g in sodium chloride 0.9 % 100 mL IVPB (0 g Intravenous Stopped 02/06/19 1425)  vancomycin (VANCOCIN) 2,500 mg in sodium chloride 0.9 % 500 mL IVPB (2,500 mg Intravenous New Bag/Given 02/06/19 1219)    Mobility walks High fall risk   Focused Assessments infection    R Recommendations: See Admitting Provider Note  Report given to:   Additional Notes: N/A

## 2019-02-06 NOTE — H&P (Addendum)
History and Physical    [name] PPI:[id number] DOB: 1970/11/03 DOA: 02/06/2019  PCP: Chesley Noon, MD   Patient coming from: Home  I have personally briefly reviewed patient's old medical records in [place]  Chief Complaint: wound dehiscence  HPI: [name] is a 48 y.o. female with medical history significant of morbid obesity (s/p lap resection), multiple other surgeries, Asthma, GERD, HTN, Depression who presents with wound dehiscence.    Patient presents today with wound dehiscence.   She reports it had begun to open on her initial follow-up appointment on 10/27 and has gradually and progressively worsened since that time.  Patient reports she underwent elective panniculectomy on 10/19, along with hernia repair, who presents with pain and wound dehiscence.  Patient reports she has multiple surgeries in the past and feels that she has always had difficulty with wound healing.  She reports after surgery she had gone home and was managing her dressings alongside her mom (she had not yet had home health yet).  She states she was anticipating wound vac tomorrow but pain and drainage were becoming unmanageable.  She states she was unable to move to get up and go to the restroom.  She states she has been largely sedentary and having poor intake.  She states she has been wetting herself in bed due to the level of pain and lack of willingness to pain.  She feels the sutures have been popping and leading to acute episodes of pain.  She is also complaining of low back pain due to the dehiscence.  She presents today due to worsening pain after d/w surgeons.    She had only been using tylenol and ibuprofen for pain control.  She was concerned about using her hydrocodone due to nausea.  She used the ibuprofen every 6 hours.  She denies any bleeding from wound dehiscence (no fresh blood).  She does not report fevers, but does report chills and sweats.  She states the wound is  severely malodorous.  She states she has been trying to keep it clean.    She has not been eating or drinking much at home due to fear of pain from needing to ambulate to use the restroom.  She states she has only been taking her antibiotics and anti-emetics.  Patient has had multiple surgeries in the past, reporting difficulty with wound healing in past (hysterectomy, CCY, hernia repairs, inguinal, hiatal, LOA)  Patient reports prior to surgery, she was active, going to the gym, walking with her dogs, was independent in ambulation and ADLs without a need for DME.  Former smoker - quit in July (at time of pneumonia admission); she had smoked 10+ years at 1 ppd She is a current EtOH user but very occasional, socially No other drug use   ED Course: Pain improved, but reporting new reflux symptoms and feeling like skin is on fire.  Review of Systems: As per HPI otherwise 10 point review of systems negative.    Past Medical History:  Diagnosis Date   Anxiety    Arthritis    joints   Asthma    Endometriosis    Fat necrosis of breast    right   GERD (gastroesophageal reflux disease)    History of hiatal hernia    s/p repair few times   History of suicide attempt 03/2017   Hypertension    MDD (major depressive disorder)    Panniculitis    Pneumonia 10/2018   in  hospital with pneumonia   PONV (postoperative nausea and vomiting)    Psoriasis    treated with Humeria    Past Surgical History:  Procedure Laterality Date   ACHILLES TENDON REPAIR Left 01-11-2015     BREAST CYST EXCISION Right 01/24/2019   Procedure: EXCISIN OF RIGHT BREAST FAT NECROSIS;  Surgeon: Peggye Form, DO;  Location: [facility];  Service: Plastics;  Laterality: Right;   BREAST REDUCTION SURGERY Bilateral 01/09/2016   Procedure: MAMMARY REDUCTION  (BREAST) BILATERAL;  Surgeon: Peggye Form, DO;  Location: [facility];  Service: Plastics;  Laterality: Bilateral;    CARPOMETACARPAL (CMC) FUSION OF THUMB Right 05-04-2015      COMBINED HYSTEROSCOPY DIAGNOSTIC / D&C  06-23-2003     dr Ambrose Mantle    D & C HYSTERSCOPY /  DX LAPAROSCOPY CONVERSION LAPAROTOMY LYSIS ADHESIONS WITH RIGHT SALPINECTOMY  12-26-2003    dr Edward Jolly     LAPAROSCOPIC CHOLECYSTECTOMY  10-19-2002   dr Orson Slick     LAPAROSCOPIC GASTRIC BANDING  09-25-2003     WITH HIATAL HERNIA REPAIR   LAPAROSCOPIC LYSIS OF ADHESIONS N/A 01/24/2019   Procedure: LAPAROSCOPY WITH LYSIS OF ADHESIONS;  Surgeon: Luretha Murphy, MD;  Location: [facility];  Service: General;  Laterality: N/A;   LAPAROSCOPIC REPAIR AND REMOVAL OF GASTRIC BAND  02-06-2006     LAPAROTOMY LYSIS ADHESIONS , UTERINE BIOPSY  01-31-2000   dr lowe     OPEN WOUND DEBRIDEMENT ANTERIOR ABDOMINAL WALL , REMOVAL Oswald Hillock BODY'S  05-10-2001   dr Orson Slick     PANNICULECTOMY N/A 01/24/2019   Procedure: PANNICULECTOMY;  Surgeon: Peggye Form, DO;  Location: [facility];  Service: Plastics;  Laterality: N/A;   REPAIR RECURRENT  HIATAL HERNIA   04-23-2004     TAKEDOWN RECURRENT HIATAL HERNIA FROM PREVIOUS MESH REPAIR OF DIAPHRAGM/ REPAIR HIATAL HERNIA AND THE GASTROPEXY  11/05/2004   TONSILLECTOMY  age 10   TOTAL ABDOMINAL HYSTERECTOMY W/ BILATERAL SALPINGOOPHORECTOMY  12-23-2005   dr soper   W/  BILATERAL URETEROLYSIS AND EXTENSIVE ABD. LYSIS ADHESIONS   UMBILICAL HERNIA REPAIR  12/27/2009   LAPAROSCOPY TAKEDOWN INCARCERATED UMBILICAL HERNIA WITH REPAIR/ OPEN REPAIR COMPLEX LOWER INCISIONAL HERNIA     reports that she quit smoking about 3 years ago. Her smoking use included cigarettes. She has a 20.00 pack-year smoking history. She has never used smokeless tobacco. She reports that she does not drink alcohol or use drugs.  Allergies  Allergen Reactions   Influenza Vaccines     States her "arm had a knot and turned red"   Lisinopril-Hydrochlorothiazide     welts    Family History  Problem Relation Age  of Onset   Cancer Maternal Aunt        melanoma   Diabetes Father    Heart attack Father 24     Prior to Admission medications   Medication Sig Start Date End Date Taking? Authorizing Provider  acetaminophen (TYLENOL) 500 MG tablet Take 1,000 mg by mouth every 4 (four) hours as needed for fever or headache.    [provider]  albuterol (PROVENTIL HFA;VENTOLIN HFA) 108 (90 BASE) MCG/ACT inhaler Inhale 2 puffs into the lungs every 6 (six) hours as needed. For shortness of breath    [provider]  ALPRAZolam (XANAX) 0.5 MG tablet Take 0.5 mg by mouth 3 (three) times daily as needed for anxiety.  05/06/18   [provider]  azelastine (ASTELIN) 137 MCG/SPRAY nasal spray Place 2 sprays into both  nostrils 2 (two) times daily as needed for rhinitis. Use in each nostril as directed    [provider]  b complex vitamins tablet Take 1 tablet by mouth daily.    [provider]  Biotin 53299 MCG TABS Take 10,000 mcg by mouth daily.    [provider]  buPROPion (WELLBUTRIN XL) 300 MG 24 hr tablet Take 300 mg by mouth daily.    [provider]  cephALEXin (KEFLEX) 500 MG capsule Take 1 capsule (500 mg total) by mouth 4 (four) times daily for 5 days. 02/01/19 02/06/19  Dillingham, [name], DO  cetirizine (ZYRTEC) 10 MG tablet Take 10 mg by mouth daily as needed for allergies.     [provider]  doxycycline (VIBRA-TABS) 100 MG tablet Take 1 tablet (100 mg total) by mouth 2 (two) times daily for 7 days. 02/03/19 02/10/19  Scheeler, Kermit Balo, PA-C  HYDROcodone-acetaminophen (NORCO) 5-325 MG tablet Take 1 tablet by mouth every 6 (six) hours as needed for up to 5 days for severe pain. 02/01/19 02/06/19  Dillingham, [name], DO  ipratropium-albuterol (DUONEB) 0.5-2.5 (3) MG/3ML SOLN Take 3 mLs by nebulization every 6 (six) hours as needed. Patient taking differently: Take 3 mLs by nebulization every 6 (six) hours as needed (asthma).   09/25/18   Marguerita Merles Latif, DO  mirtazapine (REMERON) 15 MG tablet Take 1 tablet (15 mg total) by mouth at bedtime. For mood control 03/27/17   Money, Feliz Beam B, FNP  montelukast (SINGULAIR) 10 MG tablet Take 10 mg by mouth at bedtime.  02/04/17   [provider]  Multiple Vitamin (MULTIVITAMIN) tablet Take 1 tablet by mouth daily.    [provider]  olmesartan (BENICAR) 40 MG tablet Take 40 mg by mouth every morning.  04/13/17   [provider]  ondansetron (ZOFRAN) 4 MG tablet Take 1 tablet (4 mg total) by mouth every 8 (eight) hours as needed for nausea or vomiting. 01/11/19   Scheeler, Kermit Balo, PA-C  pantoprazole (PROTONIX) 40 MG tablet Take 40 mg by mouth 2 (two) times daily.     [provider]  polyethylene glycol (MIRALAX / GLYCOLAX) 17 g packet Take 17 g by mouth daily as needed for mild constipation. 09/25/18   Marguerita Merles Latif, DO  promethazine (PHENERGAN) 25 MG tablet Take 25 mg by mouth every 8 (eight) hours as needed for nausea or vomiting.  12/31/17   [provider]  traZODone (DESYREL) 50 MG tablet Take 1 tablet (50 mg total) by mouth at bedtime as needed for sleep. For sleep Patient taking differently: Take 50 mg by mouth at bedtime. For sleep 03/27/17   Money, Gerlene Burdock, FNP  venlafaxine XR (EFFEXOR-XR) 150 MG 24 hr capsule Take 150 mg by mouth daily with breakfast.  05/04/18   [provider]  vitamin A 8000 UNIT capsule Take 8,000 Units by mouth daily.    [provider]  vitamin B-12 (CYANOCOBALAMIN) 1000 MCG tablet Take 1,000 mcg by mouth daily.    [provider]    Physical Exam: Vitals:   02/06/19 1102 02/06/19 1209 02/06/19 1214  BP: (!) 125/91 128/86   Pulse: (!) 104 (!) 101   Resp: 20 18   Temp: 98.1 F (36.7 C)    TempSrc: Oral    SpO2: 97% 96%   Weight:   (S) (!) 159.8 kg  Height:   (S) 5\' 11"  (1.803 m)    Constitutional: NAD, calm, comfortable Vitals:   02/06/19  1102 02/06/19 1209  02/06/19 1214  BP: (!) 125/91 128/86   Pulse: (!) 104 (!) 101   Resp: 20 18   Temp: 98.1 F (36.7 C)    TempSrc: Oral    SpO2: 97% 96%   Weight:   (S) (!) 159.8 kg  Height:   (S) 5\' 11"  (1.803 m)   General: Pleasant, NAD, Obese Eyes: EOMI, anicteric sclera ENMT: Mucous membranes are moist. Posterior pharynx clear of any exudate or lesions.Normal dentition.  Neck: normal, supple, no masses, no thyromegaly Respiratory: clear to auscultation bilaterally, no wheezing, no crackles. Normal respiratory effort. No accessory muscle use.  Cardiovascular: Regular rate and rhythm, no murmurs / rubs / gallops. No extremity edema. 2+ pedal pulses. Abdomen: obese, notable dehisced surgical wound, no active drainage, but foul smelling.  Erythema to wound edges.  No palpable fluctuance. Musculoskeletal: no clubbing / cyanosis. No joint deformity upper and lower extremities. Good ROM, no contractures. Normal muscle tone.  Skin: ifra-abdominal transverse wound from surgery Neurologic: CN grossly intact, no focal sensory or motor deficits  Psychiatric: Normal judgment and insight. Alert and oriented x 3. Normal mood.   Labs on Admission: I have personally reviewed following labs and imaging studies  CBC: Recent Labs  Lab 02/06/19 1123  WBC 12.2*  NEUTROABS 8.8*  HGB 9.6*  HCT 31.5*  MCV 101.6*  PLT 449*   Basic Metabolic Panel: Recent Labs  Lab 02/06/19 1123  NA 139  K 4.1  CL 105  CO2 25  GLUCOSE 120*  BUN 11  CREATININE 0.97  CALCIUM 8.1*   GFR: Estimated Creatinine Clearance: 117.8 mL/min (by C-G formula based on SCr of 0.97 mg/dL). Liver Function Tests: Recent Labs  Lab 02/06/19 1123  AST 19  ALT 13  ALKPHOS 59  BILITOT 0.4  PROT 6.1*  ALBUMIN 2.4*   No results for input(s): LIPASE, AMYLASE in the last 168 hours. No results for input(s): AMMONIA in the last 168 hours. Coagulation Profile: No results for input(s): INR, PROTIME in the last 168 hours. Cardiac  Enzymes: No results for input(s): CKTOTAL, CKMB, CKMBINDEX, TROPONINI in the last 168 hours. BNP (last 3 results) No results for input(s): PROBNP in the last 8760 hours. HbA1C: No results for input(s): HGBA1C in the last 72 hours. CBG: No results for input(s): GLUCAP in the last 168 hours. Lipid Profile: No results for input(s): CHOL, HDL, LDLCALC, TRIG, CHOLHDL, LDLDIRECT in the last 72 hours. Thyroid Function Tests: No results for input(s): TSH, T4TOTAL, FREET4, T3FREE, THYROIDAB in the last 72 hours. Anemia Panel: No results for input(s): VITAMINB12, FOLATE, FERRITIN, TIBC, IRON, RETICCTPCT in the last 72 hours. Urine analysis:    Component Value Date/Time   COLORURINE YELLOW 11/05/2018 2056   APPEARANCEUR CLEAR 11/05/2018 2056   LABSPEC 1.015 11/05/2018 2056   PHURINE 6.0 11/05/2018 2056   GLUCOSEU NEGATIVE 11/05/2018 2056   HGBUR NEGATIVE 11/05/2018 2056   BILIRUBINUR NEGATIVE 11/05/2018 2056   KETONESUR NEGATIVE 11/05/2018 2056   PROTEINUR NEGATIVE 11/05/2018 2056   UROBILINOGEN 0.2 02/01/2011 2209   NITRITE NEGATIVE 11/05/2018 2056   LEUKOCYTESUR NEGATIVE 11/05/2018 2056    Radiological Exams on Admission: No results found.  Assessment/Plan  HPI: [name] is a 48 y.o. female with medical history significant of morbid obesity (s/p lap resection), multiple other surgeries, Asthma, GERD, HTN, Depression who presents with wound dehiscence with plan to return to OR this week.  # Morbid Obesity s/p panniculectomy c/b wound dehiscence # Possible Sepsis 2/2 wound infection -  patient with recent surgery on 10/19 c/b post-operatively by wound dehiscence and now reporting chills and night sweats concerning for possible infection/sepsis with leukocytosis, tachycardia, mild elevation in lactate.  Wound does not have notably purulent drainage, is foul smelling and erythema at wound edges are not warm so less clear if this is acute abdominal wall cellulitis.  She does not  report other localizing symptoms at this time. - UA pending, will obtain CXR for infection work-up, blood cultures pending - patient had been on Doxycycline and cephalexin; d/w Dr. Arita Miss and appreciate consultation, will plan to cover empirically with Augmentin and Doxycycline, the former will cover strep and GNR/anaerobic coverage while doxycycline will provide Staph coverage.  Recommend narrowing spectrum as culture data returns - continue IVF - repeat lactate pending - ordered A1c for AM - wound care consulted, continue saline wet to dry dressings - nutrition, PT and OT consulted - pain control, encourage mobilization as tolerated (IV breakthrough not yet ordered, patient has been managing with tylenol alone at home) - appropriate to order if PO unable to provide relief - anticipate wound vac per Plastic Surgery - appreciate consultation from Dr. Arita Miss, anticipate OR later this week with Dr. Ulice Bold (made patient NPO at midnight but unsure if she will go to OR in AM) Addendum: patient's CXR with new opacity -> possibly atelectasis in absence of respiratory symptoms, added legionella and strep antigen, procalcitonin as well (patient is already on Augmentin/Doxy)  # HTN - continue irbesartan  # Depression - mood stable, no SI/HI - follows with PCP and Psychiatry - resume antidepressants bupropion, venlafaxine, trazodone, mirtazapine  # GERD - continue pta BID PPI  # Macrocytic Anemia - stable, also c/b blood loss anemia post-operatively   # Asthma - mild-intermittent - continue pta medications/inhalers  DVT prophylaxis: Lovenox Code Status: Full Code Emergency Contact    Leda Gauze [phone] Disposition Plan: pending Consults called: Plastic Surgery - Dr. Arita Miss Admission status: inpatient   Clydia Llano MD [facility] Pager [phone]  If 7PM-7AM, please contact night-coverage [email or web address] Password [email or web address]  02/06/2019, 1:22 PM

## 2019-02-06 NOTE — ED Notes (Signed)
Date and time results received: 02/06/19 12:35 PM  (use smartphrase ".now" to insert current time)  Test: Lactic Acid Critical Value: 2.1  Name of Provider Notified: Dr. Zenia Resides  Orders Received? Or Actions Taken?:

## 2019-02-06 NOTE — H&P (View-Only) (Signed)
Reason for Consult: Wound dehiscence Referring Physician: Dr. Waymon Budge [name] is an 48 y.o. female.  HPI: Patient is presenting with a wound dehiscence after panniculectomy.  She was seen in the office on Friday and has failed outpatient management with wound care.  She has had a very difficult time managing the wound care and is in a significant amount of pain.  She says she has a history of poor wound healing from other surgeries.  She reports serous drainage from the wound.  She does not think she has had much in the way of fevers and chills.  She has been in touch with both myself and Dr. Elisabeth Cara over the last day or so.  She has been on Doxy doxycycline outpatient.  Past Medical History:  Diagnosis Date  . Anxiety   . Arthritis    joints  . Asthma   . Endometriosis   . Fat necrosis of breast    right  . GERD (gastroesophageal reflux disease)   . History of hiatal hernia    s/p repair few times  . History of suicide attempt 03/2017  . Hypertension   . MDD (major depressive disorder)   . Panniculitis   . Pneumonia 10/2018   in hospital with pneumonia  . PONV (postoperative nausea and vomiting)   . Psoriasis    treated with Humeria    Past Surgical History:  Procedure Laterality Date  . ACHILLES TENDON REPAIR Left 01-11-2015  @[facility]   . BREAST CYST EXCISION Right 01/24/2019   Procedure: EXCISIN OF RIGHT BREAST FAT NECROSIS;  Surgeon: Wallace Going, DO;  Location: [facility];  Service: Plastics;  Laterality: Right;  . BREAST REDUCTION SURGERY Bilateral 01/09/2016   Procedure: MAMMARY REDUCTION  (BREAST) BILATERAL;  Surgeon: Wallace Going, DO;  Location: [facility];  Service: Plastics;  Laterality: Bilateral;  . CARPOMETACARPAL (CMC) FUSION OF THUMB Right 05-04-2015   @[facility]   . COMBINED HYSTEROSCOPY DIAGNOSTIC / D&C  06-23-2003     dr Ulanda Edison @[facility]   . D & C HYSTERSCOPY /  DX LAPAROSCOPY CONVERSION LAPAROTOMY LYSIS ADHESIONS WITH RIGHT SALPINECTOMY   12-26-2003    dr Quincy Simmonds  @[facility]   . LAPAROSCOPIC CHOLECYSTECTOMY  10-19-2002   dr Deon Pilling  @[facility]   . LAPAROSCOPIC GASTRIC BANDING  09-25-2003  @[facility]    WITH HIATAL HERNIA REPAIR  . LAPAROSCOPIC LYSIS OF ADHESIONS N/A 01/24/2019   Procedure: LAPAROSCOPY WITH LYSIS OF ADHESIONS;  Surgeon: Johnathan Hausen, MD;  Location: [facility];  Service: General;  Laterality: N/A;  . LAPAROSCOPIC REPAIR AND REMOVAL OF GASTRIC BAND  02-06-2006  @[facility]   . LAPAROTOMY LYSIS ADHESIONS , UTERINE BIOPSY  01-31-2000   dr Corinna Capra  @[facility]   . OPEN WOUND [place] , REMOVAL Valetta Fuller BODY'S  05-10-2001   dr Deon Pilling  @[facility]   . PANNICULECTOMY N/A 01/24/2019   Procedure: PANNICULECTOMY;  Surgeon: Wallace Going, DO;  Location: [facility];  Service: Plastics;  Laterality: N/A;  . REPAIR RECURRENT  HIATAL HERNIA   04-23-2004  @[facility]   . TAKEDOWN RECURRENT HIATAL HERNIA FROM PREVIOUS MESH REPAIR OF DIAPHRAGM/ REPAIR HIATAL HERNIA AND THE GASTROPEXY  11/05/2004  . TONSILLECTOMY  age 52  . TOTAL ABDOMINAL HYSTERECTOMY W/ BILATERAL SALPINGOOPHORECTOMY  12-23-2005   dr soper   W/  BILATERAL URETEROLYSIS AND EXTENSIVE ABD. LYSIS ADHESIONS  . UMBILICAL HERNIA REPAIR  12/27/2009   LAPAROSCOPY TAKEDOWN INCARCERATED UMBILICAL HERNIA WITH REPAIR/ OPEN REPAIR COMPLEX LOWER INCISIONAL HERNIA    Family History  Problem Relation Age  of Onset  . Cancer Maternal Aunt        melanoma  . Diabetes Father   . Heart attack Father 52    Social History:  reports that she quit smoking about 3 years ago. Her smoking use included cigarettes. She has a 20.00 pack-year smoking history. She has never used smokeless tobacco. She reports that she does not drink alcohol or use drugs.  Allergies:  Allergies  Allergen Reactions  . Influenza Vaccines     States her "arm had a knot and turned red"  . Lisinopril-Hydrochlorothiazide     welts    No current facility-administered medications on file prior to encounter.    Current Outpatient Medications on  File Prior to Encounter  Medication Sig Dispense Refill  . acetaminophen (TYLENOL) 500 MG tablet Take 1,000 mg by mouth every 4 (four) hours as needed for fever or headache.    . albuterol (PROVENTIL HFA;VENTOLIN HFA) 108 (90 BASE) MCG/ACT inhaler Inhale 2 puffs into the lungs every 6 (six) hours as needed. For shortness of breath    . ALPRAZolam (XANAX) 0.5 MG tablet Take 0.5 mg by mouth 3 (three) times daily as needed for anxiety.     Marland Kitchen azelastine (ASTELIN) 137 MCG/SPRAY nasal spray Place 2 sprays into both nostrils 2 (two) times daily as needed for rhinitis. Use in each nostril as directed    . b complex vitamins tablet Take 1 tablet by mouth daily.    . Biotin 65790 MCG TABS Take 10,000 mcg by mouth daily.    Marland Kitchen buPROPion (WELLBUTRIN XL) 300 MG 24 hr tablet Take 300 mg by mouth daily.    . cephALEXin (KEFLEX) 500 MG capsule Take 1 capsule (500 mg total) by mouth 4 (four) times daily for 5 days. 20 capsule 0  . cetirizine (ZYRTEC) 10 MG tablet Take 10 mg by mouth daily as needed for allergies.     Marland Kitchen doxycycline (VIBRA-TABS) 100 MG tablet Take 1 tablet (100 mg total) by mouth 2 (two) times daily for 7 days. 14 tablet 0  . gabapentin (NEURONTIN) 300 MG capsule Take 300 mg by mouth 3 (three) times daily.    Marland Kitchen HYDROcodone-acetaminophen (NORCO) 5-325 MG tablet Take 1 tablet by mouth every 6 (six) hours as needed for up to 5 days for severe pain. 20 tablet 0  . ipratropium-albuterol (DUONEB) 0.5-2.5 (3) MG/3ML SOLN Take 3 mLs by nebulization every 6 (six) hours as needed. (Patient taking differently: Take 3 mLs by nebulization every 6 (six) hours as needed (asthma). ) 360 mL 0  . mirtazapine (REMERON) 15 MG tablet Take 1 tablet (15 mg total) by mouth at bedtime. For mood control 30 tablet 0  . montelukast (SINGULAIR) 10 MG tablet Take 10 mg by mouth at bedtime.     . Multiple Vitamin (MULTIVITAMIN) tablet Take 1 tablet by mouth daily.    Marland Kitchen olmesartan (BENICAR) 40 MG tablet Take 40 mg by mouth every  morning.     . ondansetron (ZOFRAN) 4 MG tablet Take 1 tablet (4 mg total) by mouth every 8 (eight) hours as needed for nausea or vomiting. 20 tablet 0  . pantoprazole (PROTONIX) 40 MG tablet Take 40 mg by mouth 2 (two) times daily.     . polyethylene glycol (MIRALAX / GLYCOLAX) 17 g packet Take 17 g by mouth daily as needed for mild constipation. 14 each 0  . promethazine (PHENERGAN) 25 MG tablet Take 25 mg by mouth every 8 (eight) hours as needed for  nausea or vomiting.     . RESTASIS 0.05 % ophthalmic emulsion Place 1 drop into both eyes 2 (two) times daily.    . traZODone (DESYREL) 50 MG tablet Take 1 tablet (50 mg total) by mouth at bedtime as needed for sleep. For sleep (Patient taking differently: Take 50 mg by mouth at bedtime. For sleep) 30 tablet 0  . venlafaxine XR (EFFEXOR-XR) 150 MG 24 hr capsule Take 150 mg by mouth daily with breakfast.     . vitamin A 8000 UNIT capsule Take 8,000 Units by mouth daily.    . vitamin B-12 (CYANOCOBALAMIN) 1000 MCG tablet Take 1,000 mcg by mouth daily.       Results for orders placed or performed during the hospital encounter of 02/06/19 (from the past 48 hour(s))  Lactic acid, plasma     Status: Abnormal   Collection Time: 02/06/19 11:23 AM  Result Value Ref Range   Lactic Acid, Venous 2.1 (HH) 0.5 - 1.9 mmol/L    Comment: CRITICAL RESULT CALLED TO, READ BACK BY AND VERIFIED WITH: T SMITH,RN 02/06/19 1234 RHOLMES Performed at [facility], [address] [place], [place] [address]   Comprehensive metabolic panel     Status: Abnormal   Collection Time: 02/06/19 11:23 AM  Result Value Ref Range   Sodium 139 135 - 145 mmol/L   Potassium 4.1 3.5 - 5.1 mmol/L   Chloride 105 98 - 111 mmol/L   CO2 25 22 - 32 mmol/L   Glucose, Bld 120 (H) 70 - 99 mg/dL   BUN 11 6 - 20 mg/dL   Creatinine, Ser 0.97 0.44 - 1.00 mg/dL   Calcium 8.1 (L) 8.9 - 10.3 mg/dL   Total Protein 6.1 (L) 6.5 - 8.1 g/dL   Albumin 2.4 (L) 3.5 - 5.0 g/dL    AST 19 15 - 41 U/L   ALT 13 0 - 44 U/L   Alkaline Phosphatase 59 38 - 126 U/L   Total Bilirubin 0.4 0.3 - 1.2 mg/dL   GFR calc non Af Amer >60 >60 mL/min   GFR calc Af Amer >60 >60 mL/min   Anion gap 9 5 - 15    Comment: Performed at [facility], [address] [place], [place] [address]  CBC WITH DIFFERENTIAL     Status: Abnormal   Collection Time: 02/06/19 11:23 AM  Result Value Ref Range   WBC 12.2 (H) 4.0 - 10.5 K/uL   RBC 3.10 (L) 3.87 - 5.11 MIL/uL   Hemoglobin 9.6 (L) 12.0 - 15.0 g/dL   HCT 31.5 (L) 36.0 - 46.0 %   MCV 101.6 (H) 80.0 - 100.0 fL   MCH 31.0 26.0 - 34.0 pg   MCHC 30.5 30.0 - 36.0 g/dL   RDW 14.0 11.5 - 15.5 %   Platelets 449 (H) 150 - 400 K/uL   nRBC 0.2 0.0 - 0.2 %   Neutrophils Relative % 72 %   Neutro Abs 8.8 (H) 1.7 - 7.7 K/uL   Lymphocytes Relative 15 %   Lymphs Abs 1.8 0.7 - 4.0 K/uL   Monocytes Relative 7 %   Monocytes Absolute 0.8 0.1 - 1.0 K/uL   Eosinophils Relative 4 %   Eosinophils Absolute 0.5 0.0 - 0.5 K/uL   Basophils Relative 1 %   Basophils Absolute 0.1 0.0 - 0.1 K/uL   Immature Granulocytes 1 %   Abs Immature Granulocytes 0.12 (H) 0.00 - 0.07 K/uL    Comment: Performed at [place]   [facility], [address] [place], [place] [address]    No results found.   Blood pressure 128/84, pulse 94, temperature 98.1 F (36.7 C), temperature source Oral, resp. rate 20, height (S) 5\' 11"  (1.803 m), weight (S) (!) 159.8 kg, SpO2 99 %. Physical Exam  General: Obese female in no acute distress.  Alert and oriented. Abdomen: Abdomen is soft.  The majority of her lower transverse wound has dehisced.  There is mild serous drainage.  There is mild surrounding erythema.  Assessment/Plan: Patient presents with wound dehiscence after panniculectomy.  Would recommend admission for pain control, help with wound care, and potential surgical intervention this week.  While in the hospital would recommend moist saline Kerlix  packing covered with multiple ABD pads.  Also recommend abdominal binder.  Please keep head of bed elevated slightly to avoid putting tension on the wound.  Dressing needs to be changed once or twice a day but the surrounding ABD pads can be changed as needed for drainage.  More than likely will have a wound VAC applied this coming week to help with this.  Agree with antibiotic coverage for now however I suspect this is more of a wound healing issue than an infectious issue.  Please call with any questions.  Also we will plan to make her n.p.o. at midnight tonight in the event that we can find time on the operating room schedule for her tomorrow.  Until then she can have a normal diet and resume her home medications.  Allena Napoleonace S Jaclynn Laumann 02/06/2019, 1:49 PM

## 2019-02-07 ENCOUNTER — Encounter (HOSPITAL_COMMUNITY)
Admission: EM | Disposition: A | Discharge status: Home Health | Payer: Self-pay | Source: Home / Self Care | Attending: Internal Medicine

## 2019-02-07 ENCOUNTER — Inpatient Hospital Stay (HOSPITAL_COMMUNITY): Payer: Medicare HMO | Admitting: Anesthesiology

## 2019-02-07 ENCOUNTER — Other Ambulatory Visit: Payer: Self-pay

## 2019-02-07 DIAGNOSIS — T8130XA Disruption of wound, unspecified, initial encounter: Secondary | ICD-10-CM

## 2019-02-07 DIAGNOSIS — T8132XA Disruption of internal operation (surgical) wound, not elsewhere classified, initial encounter: Secondary | ICD-10-CM

## 2019-02-07 HISTORY — PX: DEBRIDEMENT AND CLOSURE WOUND: SHX5614

## 2019-02-07 LAB — BASIC METABOLIC PANEL
Anion gap: 9 (ref 5–15)
BUN: 8 mg/dL (ref 6–20)
CO2: 25 mmol/L (ref 22–32)
Calcium: 7.8 mg/dL — ABNORMAL LOW (ref 8.9–10.3)
Chloride: 107 mmol/L (ref 98–111)
Creatinine, Ser: 0.81 mg/dL (ref 0.44–1.00)
GFR calc Af Amer: >60 mL/min (ref >60)
GFR calc non Af Amer: >60 mL/min (ref >60)
Glucose, Bld: 96 mg/dL (ref 70–99)
Potassium: 4 mmol/L (ref 3.5–5.1)
Sodium: 141 mmol/L (ref 135–145)

## 2019-02-07 LAB — SURGICAL PCR SCREEN
MRSA, PCR: NEGATIVE
Staphylococcus aureus: NEGATIVE

## 2019-02-07 LAB — STREP PNEUMONIAE URINARY ANTIGEN: Strep Pneumo Urinary Antigen: NEGATIVE

## 2019-02-07 LAB — CBC
HCT: 27.1 % — ABNORMAL LOW (ref 36.0–46.0)
Hemoglobin: 8.2 g/dL — ABNORMAL LOW (ref 12.0–15.0)
MCH: 30.9 pg (ref 26.0–34.0)
MCHC: 30.3 g/dL (ref 30.0–36.0)
MCV: 102.3 fL — ABNORMAL HIGH (ref 80.0–100.0)
Platelets: 411 10*3/uL — ABNORMAL HIGH (ref 150–400)
RBC: 2.65 MIL/uL — ABNORMAL LOW (ref 3.87–5.11)
RDW: 14.2 % (ref 11.5–15.5)
WBC: 10.7 10*3/uL — ABNORMAL HIGH (ref 4.0–10.5)
nRBC: 0.2 % (ref 0.0–0.2)

## 2019-02-07 LAB — HEMOGLOBIN A1C
Hgb A1c MFr Bld: 5.8 % — ABNORMAL HIGH (ref 4.8–5.6)
Mean Plasma Glucose: 119.76 mg/dL

## 2019-02-07 LAB — PROCALCITONIN: Procalcitonin: <0.1 ng/mL

## 2019-02-07 SURGERY — DEBRIDEMENT, WOUND, WITH CLOSURE
Anesthesia: General

## 2019-02-07 MED ORDER — ONDANSETRON HCL 4 MG/2ML IJ SOLN
4.0000 mg | Freq: Once | INTRAMUSCULAR | Status: AC | PRN
  Administered 2019-02-07: 14:00:00 4 mg via INTRAVENOUS

## 2019-02-07 MED ORDER — ENOXAPARIN SODIUM 40 MG/0.4ML ~~LOC~~ SOLN
40.0000 mg | SUBCUTANEOUS | Status: DC
  Administered 2019-02-08 – 2019-02-10 (×3): 40 mg via SUBCUTANEOUS
  Filled 2019-02-07 (×3): qty 0.4

## 2019-02-07 MED ORDER — FENTANYL CITRATE (PF) 100 MCG/2ML IJ SOLN
INTRAMUSCULAR | Status: AC
  Filled 2019-02-07: qty 2

## 2019-02-07 MED ORDER — PROPOFOL 10 MG/ML IV BOLUS
INTRAVENOUS | Status: DC | PRN
  Administered 2019-02-07: 200 mg via INTRAVENOUS

## 2019-02-07 MED ORDER — FENTANYL CITRATE (PF) 100 MCG/2ML IJ SOLN
25.0000 ug | INTRAMUSCULAR | Status: DC | PRN
  Administered 2019-02-07 (×2): 50 ug via INTRAVENOUS

## 2019-02-07 MED ORDER — FENTANYL CITRATE (PF) 250 MCG/5ML IJ SOLN
INTRAMUSCULAR | Status: AC
  Filled 2019-02-07: qty 5

## 2019-02-07 MED ORDER — ACETAMINOPHEN 325 MG PO TABS: 325.0000 mg | ORAL_TABLET | ORAL | Status: DC | PRN

## 2019-02-07 MED ORDER — MEPERIDINE HCL 50 MG/ML IJ SOLN: 6.2500 mg | INTRAMUSCULAR | Status: DC | PRN

## 2019-02-07 MED ORDER — MIDAZOLAM HCL 2 MG/2ML IJ SOLN
INTRAMUSCULAR | Status: AC
  Filled 2019-02-07: qty 2

## 2019-02-07 MED ORDER — OXYCODONE HCL 5 MG/5ML PO SOLN: 5.0000 mg | Freq: Once | ORAL | Status: DC | PRN

## 2019-02-07 MED ORDER — SUGAMMADEX SODIUM 500 MG/5ML IV SOLN
INTRAVENOUS | Status: AC
  Filled 2019-02-07: qty 5

## 2019-02-07 MED ORDER — PHENYLEPHRINE 40 MCG/ML (10ML) SYRINGE FOR IV PUSH (FOR BLOOD PRESSURE SUPPORT)
PREFILLED_SYRINGE | INTRAVENOUS | Status: DC | PRN
  Administered 2019-02-07: 100 ug via INTRAVENOUS
  Administered 2019-02-07: 80 ug via INTRAVENOUS
  Administered 2019-02-07: 100 ug via INTRAVENOUS

## 2019-02-07 MED ORDER — SUGAMMADEX SODIUM 200 MG/2ML IV SOLN
INTRAVENOUS | Status: DC | PRN
  Administered 2019-02-07: 350 mg via INTRAVENOUS

## 2019-02-07 MED ORDER — ONDANSETRON HCL 4 MG/2ML IJ SOLN
INTRAMUSCULAR | Status: DC | PRN
  Administered 2019-02-07: 4 mg via INTRAVENOUS

## 2019-02-07 MED ORDER — ROCURONIUM BROMIDE 10 MG/ML (PF) SYRINGE
PREFILLED_SYRINGE | INTRAVENOUS | Status: DC | PRN
  Administered 2019-02-07: 70 mg via INTRAVENOUS

## 2019-02-07 MED ORDER — FENTANYL CITRATE (PF) 100 MCG/2ML IJ SOLN
INTRAMUSCULAR | Status: DC | PRN
  Administered 2019-02-07 (×3): 50 ug via INTRAVENOUS
  Administered 2019-02-07: 100 ug via INTRAVENOUS

## 2019-02-07 MED ORDER — ACETAMINOPHEN 160 MG/5ML PO SOLN: 325.0000 mg | ORAL | Status: DC | PRN

## 2019-02-07 MED ORDER — SODIUM CHLORIDE 0.9 % IV SOLN
INTRAVENOUS | Status: DC | PRN
  Administered 2019-02-07: 500 mL

## 2019-02-07 MED ORDER — MIDAZOLAM HCL 5 MG/5ML IJ SOLN
INTRAMUSCULAR | Status: DC | PRN
  Administered 2019-02-07: 2 mg via INTRAVENOUS

## 2019-02-07 MED ORDER — 0.9 % SODIUM CHLORIDE (POUR BTL) OPTIME
TOPICAL | Status: DC | PRN
  Administered 2019-02-07: 12:00:00 1000 mL

## 2019-02-07 MED ORDER — OXYCODONE HCL 5 MG PO TABS: 5.0000 mg | ORAL_TABLET | Freq: Once | ORAL | Status: DC | PRN

## 2019-02-07 MED ORDER — PHENYLEPHRINE HCL-NACL 10-0.9 MG/250ML-% IV SOLN
INTRAVENOUS | Status: DC | PRN
  Administered 2019-02-07: 40 ug/min via INTRAVENOUS

## 2019-02-07 MED ORDER — LACTATED RINGERS IV SOLN
INTRAVENOUS | Status: DC
  Administered 2019-02-07 (×2): via INTRAVENOUS

## 2019-02-07 MED ORDER — PROPOFOL 10 MG/ML IV BOLUS
INTRAVENOUS | Status: AC
  Filled 2019-02-07: qty 20

## 2019-02-07 MED ORDER — DEXAMETHASONE SODIUM PHOSPHATE 10 MG/ML IJ SOLN
INTRAMUSCULAR | Status: DC | PRN
  Administered 2019-02-07: 8 mg via INTRAVENOUS

## 2019-02-07 MED ORDER — LIDOCAINE 2% (20 MG/ML) 5 ML SYRINGE
INTRAMUSCULAR | Status: DC | PRN
  Administered 2019-02-07: 50 mg via INTRAVENOUS

## 2019-02-07 MED ORDER — ONDANSETRON HCL 4 MG/2ML IJ SOLN
INTRAMUSCULAR | Status: AC
  Filled 2019-02-07: qty 2

## 2019-02-07 SURGICAL SUPPLY — 48 items
BNDG ELASTIC 4X5.8 VLCR STR LF (GAUZE/BANDAGES/DRESSINGS) ×1 IMPLANT
BNDG GAUZE ELAST 4 BULKY (GAUZE/BANDAGES/DRESSINGS) IMPLANT
CONT SPEC 4OZ CLIKSEAL STRL BL (MISCELLANEOUS) ×2 IMPLANT
COVER SURGICAL LIGHT HANDLE (MISCELLANEOUS) ×1 IMPLANT
COVER WAND RF STERILE (DRAPES) IMPLANT
DECANTER SPIKE VIAL GLASS SM (MISCELLANEOUS) IMPLANT
DRAIN CHANNEL 19F RND (DRAIN) ×2 IMPLANT
DRAPE INCISE IOBAN 66X45 STRL (DRAPES) ×2 IMPLANT
DRAPE LAPAROSCOPIC ABDOMINAL (DRAPES) ×1 IMPLANT
DRAPE LAPAROTOMY T 102X78X121 (DRAPES) ×1 IMPLANT
DRAPE UTILITY XL STRL (DRAPES) ×1 IMPLANT
DRESSING DUODERM 4X4 STERILE (GAUZE/BANDAGES/DRESSINGS) ×2 IMPLANT
DRSG ADAPTIC 3X8 NADH LF (GAUZE/BANDAGES/DRESSINGS) ×1 IMPLANT
DRSG OPSITE POSTOP 4X8 (GAUZE/BANDAGES/DRESSINGS) ×1 IMPLANT
DRSG PAD ABDOMINAL 8X10 ST (GAUZE/BANDAGES/DRESSINGS) IMPLANT
ELECT REM PT RETURN 15FT ADLT (MISCELLANEOUS) ×2 IMPLANT
EVACUATOR SILICONE 100CC (DRAIN) ×2 IMPLANT
GAUZE SPONGE 4X4 12PLY STRL (GAUZE/BANDAGES/DRESSINGS) ×3 IMPLANT
GLOVE BIO SURGEON STRL SZ 6.5 (GLOVE) ×2 IMPLANT
GOWN STRL REUS W/TWL LRG LVL3 (GOWN DISPOSABLE) ×4 IMPLANT
HANDPIECE INTERPULSE COAX TIP (DISPOSABLE)
KIT BASIN OR (CUSTOM PROCEDURE TRAY) ×2 IMPLANT
KIT TURNOVER KIT A (KITS) IMPLANT
MICROMATRIX 1000MG (Tissue) ×6 IMPLANT
MICROMATRIX 500MG (Tissue) ×4 IMPLANT
NEEDLE HYPO 22GX1.5 SAFETY (NEEDLE) IMPLANT
PACK GENERAL/GYN (CUSTOM PROCEDURE TRAY) ×2 IMPLANT
PAD CAST 4YDX4 CTTN HI CHSV (CAST SUPPLIES) ×1 IMPLANT
PADDING CAST COTTON 4X4 STRL (CAST SUPPLIES)
SET HNDPC FAN SPRY TIP SCT (DISPOSABLE) IMPLANT
SOL PREP PROV IODINE SCRUB 4OZ (MISCELLANEOUS) ×1 IMPLANT
SOLUTION PARTIC MCRMTRX 1000MG (Tissue) IMPLANT
SOLUTION PARTIC MCRMTRX 500MG (Tissue) IMPLANT
SPONGE LAP 18X18 RF (DISPOSABLE) ×4 IMPLANT
STAPLER VISISTAT 35W (STAPLE) ×1 IMPLANT
SUT MNCRL AB 4-0 PS2 18 (SUTURE) ×2 IMPLANT
SUT MON AB 3-0 SH 27 (SUTURE) ×20
SUT MON AB 3-0 SH27 (SUTURE) IMPLANT
SUT MON AB 4-0 SH 27 (SUTURE) ×5 IMPLANT
SUT MON AB 5-0 PS2 18 (SUTURE) ×6 IMPLANT
SUT SILK 3 0 (SUTURE) ×4
SUT SILK 3-0 FS1 18XBRD (SUTURE) IMPLANT
SUT SILK 4 0 PS 2 (SUTURE) IMPLANT
SUT VIC AB 5-0 PS2 18 (SUTURE) IMPLANT
SWAB COLLECTION DEVICE MRSA (MISCELLANEOUS) IMPLANT
SWAB CULTURE ESWAB REG 1ML (MISCELLANEOUS) ×1 IMPLANT
SYR CONTROL 10ML LL (SYRINGE) IMPLANT
TOWEL OR 17X26 10 PK STRL BLUE (TOWEL DISPOSABLE) ×2 IMPLANT

## 2019-02-07 NOTE — Progress Notes (Signed)
Incision draining on left side from honeycomb. Reinforced.

## 2019-02-07 NOTE — Interval H&P Note (Signed)
History and Physical Interval Note:  02/07/2019 10:33 AM  [name]  has presented today for surgery, with the diagnosis of abdominal wound.  The various methods of treatment have been discussed with the patient and family. After consideration of risks, benefits and other options for treatment, the patient has consented to  Procedure(s): DEBRIDEMENT OF ABDOMINAL WOUND, PLACEMENT OF A-CELL, PLACEMENT OF WOUND VAC (N/A) as a surgical intervention.  The patient's history has been reviewed, patient examined, no change in status, stable for surgery.  I have reviewed the patient's chart and labs.  Questions were answered to the patient's satisfaction.     [name] [name]

## 2019-02-07 NOTE — Op Note (Signed)
DATE OF OPERATION: 02/07/2019  LOCATION: [facility] inpatient  PREOPERATIVE DIAGNOSIS: Abdominal wound  POSTOPERATIVE DIAGNOSIS: Same  PROCEDURE: Excision of abdomen wound 2 x 60 skin and fat with placement of ACell powder 2 g and VAC sponge  SURGEON: Claire Sanger Dillingham, DO  ASSISTANT: Roetta Sessions, PA  EBL: 10 cc  CONDITION: Stable  COMPLICATIONS: None  INDICATION: The patient, [name], is a 48 y.o. female born on 1970/07/23, is here for treatment of an abdominal wound after panniculectomy.   PROCEDURE DETAILS:  The patient was seen prior to surgery and marked.  The IV antibiotics were given. The patient was taken to the operating room and given a general anesthetic. A standard time out was performed and all information was confirmed by those in the room. SCDs were placed.   The abdomen was prepped and draped.  The wound was irrigated with antibiotic solution and saline.  A #10 blade was used to excise the skin edges of both the superior and the inferior flap.  This totaled 2 x 60 cm area of excision of skin and fat.  Hemostasis was achieved with electrocautery.  There was healthy looking granulation tissue of the abdominal wall.  There was no sign of overt infection.  There was no purulence noted.  The decision was made to do a partial closure.  The fascia was reapproximated with 3-0 Monocryl.  A drain was placed on the left and the right side.  The drains were secured to the abdominal skin with 3-0 silk.  The next layer was intermittently closed with 4-0 Monocryl.  The skin was partially closed every 3 or 4 cm with a vertical mattress 5-0 Monocryl.  The VAC was applied to the incision and there was an excellent seal. The patient was allowed to wake up and taken to recovery room in stable condition at the end of the case. The family was notified at the end of the case.   The advanced practice practitioner (APP) assisted throughout the case.  The APP was  essential in retraction and counter traction when needed to make the case progress smoothly.  This retraction and assistance made it possible to see the tissue plans for the procedure.  The assistance was needed for blood control, tissue re-approximation and assisted with closure of the incision site.

## 2019-02-07 NOTE — Progress Notes (Signed)
PT Cancellation Note  Patient Details Name: [name] MRN: [id number] DOB: 11-09-1970   Cancelled Treatment:    Reason Eval/Treat Not Completed: Patient at procedure or test/unavailable  Patient is currently at procedure to undergo debridement of abdominal wound and placement of A-Cell and possibly wound vac. Will check follow up at later date/time when patient is medically ready and available.  Kipp Brood, PT, DPT Physical Therapist with [facility]  02/07/2019 11:25 AM

## 2019-02-07 NOTE — Progress Notes (Signed)
Initial Nutrition Assessment  DOCUMENTATION CODES:   Morbid obesity  INTERVENTION:   Once diet advanced: -Ensure MAX Protein po BID, each supplement provides 150 kcal and 30 grams of protein -Prostat liquid protein PO 30 ml BID with meals, each supplement provides 100 kcal, 15 grams protein. -1 packet Juven BID, each packet provides 95 calories, 2.5 grams of protein (collagen), and 9.8 grams of carbohydrate (3 grams sugar); also contains 7 grams of L-arginine and L-glutamine, 300 mg vitamin C, 15 mg vitamin E, 1.2 mcg vitamin B-12, 9.5 mg zinc, 200 mg calcium, and 1.5 g  Calcium Beta-hydroxy-Beta-methylbutyrate to support wound healing  NUTRITION DIAGNOSIS:   Increased nutrient needs related to wound healing, post-op healing as evidenced by estimated needs.  GOAL:   Patient will meet greater than or equal to 90% of their needs   MONITOR:   PO intake, Supplement acceptance, Labs, Weight trends, I & O's, Skin  REASON FOR ASSESSMENT:   Consult Assessment of nutrition requirement/status, Wound healing  ASSESSMENT:   48 y.o. female patient with a wound dehiscence after panniculectomy. Has failed outpatient management with wound care.  Patient in OR at this time and NPO for debridement of abdominal wound, A-cell placement and possible wound vac.  Will monitor for diet advancement. Would benefit from protein supplements given increased needs, will order Ensure Max BID, Juven BID and Prostat with meals.   Per weight records, pt's weight has been trending up.  I/Os: +1.4L since admit  UOP: 2L x 24 hrs  Labs reviewed. Medications: B complex with Vitamin C tablet, Remeron tablet, Multivitamin with minerals daily, Vitamin A capsule daily, Vitamin B-12 tablet daily, Lactated Ringers infusion   NUTRITION - FOCUSED PHYSICAL EXAM: Pt in OR  Diet Order:   Diet Order            Diet NPO time specified  Diet effective midnight              EDUCATION NEEDS:   Not appropriate  for education at this time  Skin:  Skin Assessment: Reviewed RN Assessment  Last BM:  11/1  Height:   Ht Readings from Last 1 Encounters:  02/07/19 5\' 11"  (1.803 m)    Weight:   Wt Readings from Last 1 Encounters:  02/07/19 (!) 159.4 kg    Ideal Body Weight:  70.5 kg  BMI:  Body mass index is 49.01 kg/m.  Estimated Nutritional Needs:   Kcal:  2200-2400  Protein:  105-115g  Fluid:  2.2L/day  Clayton Bibles, MS, RD, LDN Inpatient Clinical Dietitian Pager: [phone] After Hours Pager: [phone]

## 2019-02-07 NOTE — Progress Notes (Signed)
OT Cancellation Note  Patient Details Name: DEYONNA FITZSIMMONS MRN: [id number] DOB: 1970-08-29   Cancelled Treatment:    Reason Eval/Treat Not Completed: Patient at procedure or test/ unavailable Consult received and chart reviewed. Patient is currently at procedure to undergo debridement of abdominal wound and placement of A-Cell and possibly wound vac. Will follow up for Occupational Therapy evaluation as able as pt becomes available and more appropriate.   Sharren Bridge  Pager [phone] 02/07/2019, 11:59 AM

## 2019-02-07 NOTE — Progress Notes (Signed)
Left message for Dr. Marla Roe to see when she wants to reschedule Lovenox since pt had surgery today.

## 2019-02-07 NOTE — Consult Note (Signed)
[facility] Nurse wound consult note Reason for Consult: Dehisced surgical wound Wound type: Surgical complication Pressure Injury POA:N/A Dressing procedure/placement/frequency: Patient seen by Dr. Claude Manges (Plastic Surgery) yesterday.  POC is to return to surgery sometime this week and for potential NPWT.  Dr. Claudia Desanctis placed orders for wound care yesterday, See below  "While in the hospital would recommend moist saline Kerlix packing covered with multiple ABD pads.  Also recommend abdominal binder.  Please keep head of bed elevated slightly to avoid putting tension on the wound.  Dressing needs to be changed once or twice a day but the surrounding ABD pads can be changed as needed for drainage."   [place] did not see and will not follow, but will remain available to this patient, the nursing, srugery and medical teams.  Please re-consult if needed. Thank you. Maudie Flakes, MSN, RN, [place], Arther Abbott  Pager# [phone]

## 2019-02-07 NOTE — Anesthesia Preprocedure Evaluation (Addendum)
Anesthesia Evaluation  Patient identified by MRN, date of birth, ID band Patient awake    History of Anesthesia Complications (+) PONV  Airway Mallampati: II  TM Distance: >3 FB     Dental   Pulmonary asthma , pneumonia, former smoker,    breath sounds clear to auscultation       Cardiovascular hypertension,  Rhythm:Regular Rate:Normal     Neuro/Psych PSYCHIATRIC DISORDERS Anxiety Depression    GI/Hepatic Neg liver ROS, hiatal hernia, GERD  ,  Endo/Other  negative endocrine ROS  Renal/GU negative Renal ROS     Musculoskeletal  (+) Arthritis ,   Abdominal   Peds  Hematology   Anesthesia Other Findings   Reproductive/Obstetrics                            Anesthesia Physical Anesthesia Plan  ASA: III  Anesthesia Plan: General   Post-op Pain Management:    Induction: Intravenous  PONV Risk Score and Plan: 4 or greater and Ondansetron, Dexamethasone and Midazolam  Airway Management Planned: Oral ETT  Additional Equipment:   Intra-op Plan:   Post-operative Plan: Extubation in OR  Informed Consent: I have reviewed the patients History and Physical, chart, labs and discussed the procedure including the risks, benefits and alternatives for the proposed anesthesia with the patient or authorized representative who has indicated his/her understanding and acceptance.     Dental advisory given  Plan Discussed with: CRNA and Anesthesiologist  Anesthesia Plan Comments:         Anesthesia Quick Evaluation

## 2019-02-07 NOTE — Progress Notes (Signed)
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  Patient was admitted with wound dehiscence.    02/07/2019: Patient seen alongside patient's husband and nurse.  Patient has just undergone surgery for lower abdominal wall wound dehiscence.  Plastic surgery team is directing care.  Assessment & Plan:   Active Problems:   Wound dehiscence  Morbid obesity status post panniculectomy: -Admitted with wound dehiscence. -Plastic surgery team is managing. -Patient has just undergone closure of the wound dehiscence.  Further care as per the surgical team.   HTN - continue irbesartan  Depression - mood stable -Further management by the PCP on discharge.  GERD - continue pta BID PPI  Macrocytic Anemia - stable, also c/b blood loss anemia post-operatively   Asthma - mild-intermittent - continue pta medications/inhalers  DVT prophylaxis: Subacute Lovenox Code Status: Full code Family Communication: Husband Disposition Plan: Home eventually   Consultants:   Plastic surgery  Procedures:   Closure of the lower abdominal wall wound dehiscence by the plastic surgery team  Antimicrobials:   IV Unasyn.   Subjective: No new complaints. No fever or chills. Pain is controlled.  Objective: Vitals:   02/07/19 1415 02/07/19 1430 02/07/19 1505 02/07/19 1604  BP: 125/75 112/79 127/80 120/77  Pulse: (!) 106 (!) 110 (!) 107 (!) 104  Resp: 10  16 18   Temp:   97.6 F (36.4 C) 97.7 F (36.5 C)  TempSrc:   Oral Oral  SpO2: 97% 98% 97% 98%  Weight:      Height:         Intake/Output Summary (Last 24 hours) at 02/07/2019 1650 Last data filed at 02/07/2019 1630 Gross per 24 hour  Intake 4271.2 ml  Output 2685 ml  Net 1586.2 ml   Filed Weights   02/06/19 1214 02/07/19 0559 02/07/19 1037  Weight: (S) (!) 159.8 kg (!) 159.4 kg (!) 159.4 kg    Examination:  General exam: Appears calm and comfortable.  Patient is morbidly obese. Respiratory system: Clear to auscultation.  Cardiovascular system: S1 & S2 heard Gastrointestinal system: Abdomen is morbidly obese.  Lower abdominal wall wound has undergone closure and dressed.   Central nervous system: Alert and oriented.  Patient moves all extremities. Extremities: Leg edema.  Data Reviewed: I have personally reviewed following labs and imaging studies  CBC: Recent Labs  Lab 02/06/19 1123 02/07/19 0345  WBC 12.2* 10.7*  NEUTROABS 8.8*  --   HGB 9.6* 8.2*  HCT 31.5* 27.1*  MCV 101.6* 102.3*  PLT 449* 411*   Basic Metabolic Panel: Recent Labs  Lab 02/06/19 1123 02/07/19 0345  NA 139 141  K 4.1 4.0  CL 105 107  CO2 25 25  GLUCOSE 120* 96  BUN 11 8  CREATININE 0.97 0.81  CALCIUM 8.1* 7.8*   GFR: Estimated Creatinine Clearance: 140.9 mL/min (by C-G formula based on SCr of 0.81 mg/dL). Liver Function Tests: Recent Labs  Lab 02/06/19 1123  AST 19  ALT 13  ALKPHOS 59  BILITOT 0.4  PROT 6.1*  ALBUMIN 2.4*   No results for input(s): LIPASE, AMYLASE in the last 168 hours. No results for input(s): AMMONIA  in the last 168 hours. Coagulation Profile: No results for input(s): INR, PROTIME in the last 168 hours. Cardiac Enzymes: No results for input(s): CKTOTAL, CKMB, CKMBINDEX, TROPONINI in the last 168 hours. BNP (last 3 results) No results for input(s): PROBNP in the last 8760 hours. HbA1C: Recent Labs    02/07/19 0345  HGBA1C 5.8*   CBG: No results for input(s): GLUCAP in the last 168 hours. Lipid Profile: No results for input(s): CHOL, HDL, LDLCALC, TRIG, CHOLHDL, LDLDIRECT in  the last 72 hours. Thyroid Function Tests: No results for input(s): TSH, T4TOTAL, FREET4, T3FREE, THYROIDAB in the last 72 hours. Anemia Panel: No results for input(s): VITAMINB12, FOLATE, FERRITIN, TIBC, IRON, RETICCTPCT in the last 72 hours. Urine analysis:    Component Value Date/Time   COLORURINE YELLOW 02/06/2019 1445   APPEARANCEUR CLEAR 02/06/2019 1445   LABSPEC 1.028 02/06/2019 1445   PHURINE 6.0 02/06/2019 1445   GLUCOSEU NEGATIVE 02/06/2019 1445   HGBUR NEGATIVE 02/06/2019 1445   BILIRUBINUR NEGATIVE 02/06/2019 1445   KETONESUR NEGATIVE 02/06/2019 1445   PROTEINUR NEGATIVE 02/06/2019 1445   UROBILINOGEN 0.2 02/01/2011 2209   NITRITE NEGATIVE 02/06/2019 1445   LEUKOCYTESUR NEGATIVE 02/06/2019 1445   Sepsis Labs: @LABRCNTIP (procalcitonin:4,lacticidven:4)  ) Recent Results (from the past 240 hour(s))  Blood Culture (routine x 2)     Status: None (Preliminary result)   Collection Time: 02/06/19 11:23 AM   Specimen: BLOOD  Result Value Ref Range Status   Specimen Description   Final    BLOOD LEFT ARM Performed at [facility], [address] [place], [place] [address]    Special Requests   Final    BOTTLES DRAWN AEROBIC AND ANAEROBIC Blood Culture results may not be optimal due to an excessive volume of blood received in culture bottles Performed at [facility], [address] [place], [place] [address]    Culture   Final    NO GROWTH < 24 HOURS Performed at [facility], [address] [place], [place] [address]    Report Status PENDING  Incomplete  SARS CORONAVIRUS 2 (TAT 6-24 HRS) Nasopharyngeal Nasopharyngeal Swab     Status: None   Collection Time: 02/06/19 11:24 AM   Specimen: Nasopharyngeal Swab  Result Value Ref Range Status   SARS Coronavirus 2 NEGATIVE NEGATIVE Final    Comment: (NOTE) SARS-CoV-2 target nucleic acids are NOT DETECTED. The SARS-CoV-2 RNA is generally detectable in upper and lower  respiratory specimens during the acute phase of infection. Negative results do not preclude SARS-CoV-2 infection, do not rule out co-infections with other pathogens, and should not be used as the sole basis for treatment or other patient management decisions. Negative results must be combined with clinical observations, patient history, and epidemiological information. The expected result is Negative. Fact Sheet for Patients: HairSlick.nohttps://www.fda.gov/media/138098/download Fact Sheet for Healthcare Providers: quierodirigir.comhttps://www.fda.gov/media/138095/download This test is not yet approved or cleared by the [place] FDA and  has been authorized for detection and/or diagnosis of SARS-CoV-2 by FDA under an Emergency Use Authorization (EUA). This EUA will remain  in effect (meaning this test can be used) for the duration of the COVID-19 declaration under Section 56 4(b)(1) of the Act, 21 U.S.C. section 360bbb-3(b)(1), unless the authorization is terminated or revoked sooner. Performed at [facility], [address] [place], [place] [address]   Blood Culture (routine x 2)     Status: None (Preliminary result)   Collection Time: 02/06/19 11:28 AM   Specimen: BLOOD  LEFT FOREARM  Result Value Ref Range Status   Specimen Description   Final    BLOOD LEFT FOREARM Performed at [facility] [address], [place], [place] [address]    Special Requests   Final    BOTTLES DRAWN AEROBIC AND ANAEROBIC Blood Culture results may not be optimal due to an excessive volume of blood received in culture bottles Performed at [facility] [address] [place], [place] [address]    Culture   Final    NO GROWTH < 24 HOURS Performed at [facility] [address], [place], [place] [address]    Report Status PENDING  Incomplete  Surgical pcr screen     Status: None   Collection Time: 02/06/19  8:24 PM   Specimen: Nasal Mucosa; Nasal Swab  Result Value  Ref Range Status   MRSA, PCR NEGATIVE NEGATIVE Final   Staphylococcus aureus NEGATIVE NEGATIVE Final    Comment: (NOTE) The Xpert SA Assay (FDA approved for NASAL specimens in patients 32 years of age and older), is one component of a comprehensive surveillance program. It is not intended to diagnose infection nor to guide or monitor treatment. Performed at [facility] [place] [address] [place], [place] [address]          Radiology Studies: Dg Chest 1 View  Result Date: 02/06/2019 CLINICAL DATA:  Recently postop from panniculectomy. Chills. EXAM: CHEST  1 VIEW COMPARISON:  11/05/2018 FINDINGS: New opacity is seen in the medial left lower lobe, which may be due to atelectasis or pneumonia. Right lung is clear. No evidence of pleural effusion. Heart size remains normal. IMPRESSION: New opacity in medial left lower lobe, which may be due to postop atelectasis or pneumonia. Electronically Signed   By: Marlaine Hind M.D.   On: 02/06/2019 15:05        Scheduled Meds: . B-complex with vitamin C  1 tablet Oral Daily  . buPROPion  300 mg Oral Daily  . cycloSPORINE  1 drop Both Eyes BID  . enoxaparin (LOVENOX) injection  40 mg Subcutaneous Q24H  . fentaNYL      . gabapentin  300 mg Oral TID  . irbesartan  300 mg Oral Daily  . mirtazapine  15 mg Oral QHS  . montelukast  10 mg Oral QHS  . multivitamin with minerals  1 tablet Oral Daily  . ondansetron      . pantoprazole  40 mg Oral BID  . traZODone  50 mg Oral QHS  . venlafaxine XR  150 mg Oral Q breakfast  . vitamin A  10,000 Units Oral Daily  . vitamin B-12  1,000 mcg Oral Daily   Continuous Infusions: . sodium chloride 1,000 mL (02/06/19 1151)  . sodium chloride 75 mL/hr at 02/06/19 1641  . ampicillin-sulbactam (UNASYN) IV 3 g (02/07/19 0804)  . doxycycline (VIBRAMYCIN) IV 100 mg (02/07/19 0337)     LOS: 1 day    Time spent: 25 minutes.    Dana Allan, MD  [facility] Pager #: [phone] 7PM-7AM contact night coverage as above

## 2019-02-07 NOTE — Anesthesia Procedure Notes (Signed)
Procedure Name: Intubation Date/Time: 02/07/2019 11:30 AM Performed by: Anne Fu, CRNA Pre-anesthesia Checklist: Patient identified, Emergency Drugs available, Suction available, Patient being monitored and Timeout performed Patient Re-evaluated:Patient Re-evaluated prior to induction Oxygen Delivery Method: Circle system utilized Preoxygenation: Pre-oxygenation with 100% oxygen Induction Type: IV induction Ventilation: Mask ventilation without difficulty Laryngoscope Size: Mac and 4 Grade View: Grade II Tube type: Oral Tube size: 7.0 mm Number of attempts: 1 Airway Equipment and Method: Stylet Placement Confirmation: ETT inserted through vocal cords under direct vision,  positive ETCO2 and breath sounds checked- equal and bilateral Secured at: 22 cm Tube secured with: Tape Dental Injury: Teeth and Oropharynx as per pre-operative assessment

## 2019-02-07 NOTE — Transfer of Care (Signed)
Immediate Anesthesia Transfer of Care Note  Patient: [name]  Procedure(s) Performed: DEBRIDEMENT OF ABDOMINAL WOUND, PLACEMENT OF A-CELL, PLACEMENT OF WOUND VAC (N/A )  Patient Location: PACU  Anesthesia Type:General  Level of Consciousness: awake, alert , oriented and patient cooperative  Airway & Oxygen Therapy: Patient Spontanous Breathing and Patient connected to face mask oxygen  Post-op Assessment: Report given to RN and Post -op Vital signs reviewed and stable  Post vital signs: Reviewed and stable  Last Vitals:  Vitals Value Taken Time  BP 116/70 02/07/19 1325  Temp    Pulse 109 02/07/19 1327  Resp 20 02/07/19 1327  SpO2 98 % 02/07/19 1327  Vitals shown include unvalidated device data.  Last Pain:  Vitals:   02/07/19 1037  TempSrc:   PainSc: 10-Worst pain ever      Patients Stated Pain Goal: 3 (30/09/23 3007)  Complications: No apparent anesthesia complications

## 2019-02-08 ENCOUNTER — Telehealth: Payer: Self-pay | Admitting: Plastic Surgery

## 2019-02-08 ENCOUNTER — Ambulatory Visit: Payer: Medicare HMO | Admitting: Plastic Surgery

## 2019-02-08 ENCOUNTER — Encounter (HOSPITAL_COMMUNITY): Payer: Self-pay | Admitting: Plastic Surgery

## 2019-02-08 LAB — LEGIONELLA PNEUMOPHILA SEROGP 1 UR AG: L. pneumophila Serogp 1 Ur Ag: NEGATIVE

## 2019-02-08 LAB — PROCALCITONIN: Procalcitonin: <0.1 ng/mL

## 2019-02-08 MED ORDER — JUVEN PO PACK
1.0000 | PACK | Freq: Two times a day (BID) | ORAL | Status: DC
  Administered 2019-02-08 – 2019-02-10 (×5): 1 via ORAL
  Filled 2019-02-08 (×6): qty 1

## 2019-02-08 MED ORDER — ENSURE MAX PROTEIN PO LIQD
11.0000 [oz_av] | Freq: Two times a day (BID) | ORAL | Status: DC
  Administered 2019-02-08 – 2019-02-10 (×5): 11 [oz_av] via ORAL

## 2019-02-08 MED ORDER — PRO-STAT SUGAR FREE PO LIQD
30.0000 mL | Freq: Two times a day (BID) | ORAL | Status: DC
  Administered 2019-02-08 – 2019-02-10 (×5): 30 mL via ORAL
  Filled 2019-02-08 (×4): qty 30

## 2019-02-08 NOTE — Anesthesia Postprocedure Evaluation (Signed)
Anesthesia Post Note  Patient: [name]  Procedure(s) Performed: DEBRIDEMENT OF ABDOMINAL WOUND, PLACEMENT OF A-CELL, PLACEMENT OF WOUND VAC (N/A )     Patient location during evaluation: PACU Anesthesia Type: General Level of consciousness: awake and alert Pain management: pain level controlled Vital Signs Assessment: post-procedure vital signs reviewed and stable Respiratory status: spontaneous breathing, nonlabored ventilation, respiratory function stable and patient connected to nasal cannula oxygen Cardiovascular status: blood pressure returned to baseline and stable Postop Assessment: no apparent nausea or vomiting Anesthetic complications: no    Last Vitals:  Vitals:   02/08/19 0216 02/08/19 0622  BP: 113/75 116/68  Pulse: (!) 109 99  Resp: 16 14  Temp: 36.5 C 36.6 C  SpO2: 93% 96%    Last Pain:  Vitals:   02/08/19 0622  TempSrc: Oral  PainSc:                  [name]

## 2019-02-08 NOTE — Progress Notes (Signed)
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  Patient was admitted with wound dehiscence.    02/07/2019: Patient seen alongside patient's husband and nurse.  Patient has just undergone surgery for lower abdominal wall wound dehiscence.  Plastic surgery team is directing care.  02/08/2019: Patient seen.  Patient is medically stable.  Patient be discharged back home once cleared for discharge by the plastic surgery team.  As per plastic surgery team, they are looking at possible discharge towards the end of the week.  Assessment & Plan:   Active Problems:   Wound dehiscence  Morbid obesity status post panniculectomy: -Admitted with wound dehiscence. -Plastic surgery team is managing. -Patient has just undergone closure of the wound dehiscence.   -Further care as per the surgical team.  HTN - continue irbesartan -Blood pressures well controlled.  Depression - mood stable -Further management by the PCP on discharge.  GERD - continue pta BID PPI  Macrocytic Anemia - stable, also c/b blood loss anemia post-operatively   Asthma - mild-intermittent, but stable (no symptoms for now). - continue medications/inhalers  DVT prophylaxis: Subacute Lovenox Code Status: Full code Family Communication: Husband Disposition Plan: Home eventually   Consultants:   Plastic surgery  Procedures:   Closure of the lower abdominal wall wound dehiscence by the plastic surgery team  Antimicrobials:   IV Unasyn.   Subjective: No new complaints. No fever or chills. No shortness of  breath or wheezing. Pain is controlled.  Objective: Vitals:   02/08/19 0216 02/08/19 0622 02/08/19 0737 02/08/19 1027  BP: 113/75 116/68  124/68  Pulse: (!) 109 99  (!) 114  Resp: 16 14  16   Temp: 97.7 F (36.5 C) 97.9 F (36.6 C)  97.8 F (36.6 C)  TempSrc: Oral Oral  Oral  SpO2: 93% 96%  100%  Weight:   (!) 161.8 kg   Height:        Intake/Output Summary (Last 24 hours) at 02/08/2019 1247 Last data filed at 02/08/2019 1008 Gross per 24 hour  Intake 4246.91 ml  Output 2685 ml  Net 1561.91 ml   Filed Weights   02/07/19 0559 02/07/19 1037 02/08/19 0737  Weight: (!) 159.4 kg (!) 159.4 kg (!) 161.8 kg    Examination:  General exam: Appears calm and comfortable.  Patient is morbidly obese. Respiratory system: Clear to auscultation.  Cardiovascular system: S1 & S2 heard Gastrointestinal system: Abdomen is morbidly obese.  Lower abdominal wall wound has undergone closure and dressed.   Central nervous system: Alert and oriented.  Patient moves all extremities. Extremities: Leg edema.  Data Reviewed: I have personally reviewed following labs and imaging studies  CBC: Recent Labs  Lab 02/06/19 1123 02/07/19 0345  WBC 12.2* 10.7*  NEUTROABS 8.8*  --   HGB 9.6* 8.2*  HCT 31.5* 27.1*  MCV 101.6* 102.3*  PLT 449* 272*   Basic Metabolic Panel: Recent Labs  Lab 02/06/19 1123 02/07/19 0345  NA 139 141  K 4.1 4.0  CL 105 107  CO2 25 25  GLUCOSE 120* 96  BUN 11 8  CREATININE 0.97 0.81  CALCIUM 8.1* 7.8*   GFR:  Estimated Creatinine Clearance: 142.2 mL/min (by C-G formula based on SCr of 0.81 mg/dL). Liver Function Tests: Recent Labs  Lab 02/06/19 1123  AST 19  ALT 13  ALKPHOS 59  BILITOT 0.4  PROT 6.1*  ALBUMIN 2.4*   No results for input(s): LIPASE, AMYLASE in the last 168 hours. No results for input(s): AMMONIA in the last 168 hours. Coagulation Profile: No results for input(s): INR, PROTIME in the last 168 hours. Cardiac Enzymes: No results for  input(s): CKTOTAL, CKMB, CKMBINDEX, TROPONINI in the last 168 hours. BNP (last 3 results) No results for input(s): PROBNP in the last 8760 hours. HbA1C: Recent Labs    02/07/19 0345  HGBA1C 5.8*   CBG: No results for input(s): GLUCAP in the last 168 hours. Lipid Profile: No results for input(s): CHOL, HDL, LDLCALC, TRIG, CHOLHDL, LDLDIRECT in the last 72 hours. Thyroid Function Tests: No results for input(s): TSH, T4TOTAL, FREET4, T3FREE, THYROIDAB in the last 72 hours. Anemia Panel: No results for input(s): VITAMINB12, FOLATE, FERRITIN, TIBC, IRON, RETICCTPCT in the last 72 hours. Urine analysis:    Component Value Date/Time   COLORURINE YELLOW 02/06/2019 1445   APPEARANCEUR CLEAR 02/06/2019 1445   LABSPEC 1.028 02/06/2019 1445   PHURINE 6.0 02/06/2019 1445   GLUCOSEU NEGATIVE 02/06/2019 1445   HGBUR NEGATIVE 02/06/2019 1445   BILIRUBINUR NEGATIVE 02/06/2019 1445   KETONESUR NEGATIVE 02/06/2019 1445   PROTEINUR NEGATIVE 02/06/2019 1445   UROBILINOGEN 0.2 02/01/2011 2209   NITRITE NEGATIVE 02/06/2019 1445   LEUKOCYTESUR NEGATIVE 02/06/2019 1445   Sepsis Labs: @LABRCNTIP (procalcitonin:4,lacticidven:4)  ) Recent Results (from the past 240 hour(s))  Blood Culture (routine x 2)     Status: None (Preliminary result)   Collection Time: 02/06/19 11:23 AM   Specimen: BLOOD  Result Value Ref Range Status   Specimen Description   Final    BLOOD LEFT ARM Performed at [facility], [address] [place], [place]    Special Requests   Final    BOTTLES DRAWN AEROBIC AND ANAEROBIC Blood Culture results may not be optimal due to an excessive volume of blood received in culture bottles Performed at [facility], [address], [place], [place]    Culture   Final    NO GROWTH 2 DAYS Performed at [facility], [address] [place], [place]    Report Status PENDING  Incomplete  SARS CORONAVIRUS 2 (TAT  6-24 HRS) Nasopharyngeal Nasopharyngeal Swab     Status: None   Collection Time: 02/06/19 11:24 AM   Specimen: Nasopharyngeal Swab  Result Value Ref Range Status   SARS Coronavirus 2 NEGATIVE NEGATIVE Final    Comment: (NOTE) SARS-CoV-2 target nucleic acids are NOT DETECTED. The SARS-CoV-2 RNA is generally detectable in upper and lower respiratory specimens during the acute phase of infection. Negative results do not preclude SARS-CoV-2 infection, do not rule out co-infections with other pathogens, and should not be used as the sole basis for treatment or other patient management decisions. Negative results must be combined with clinical observations, patient history, and epidemiological information. The expected result is Negative. Fact Sheet for Patients: 13/01/20 Fact Sheet for Healthcare Providers: HairSlick.no This test is not yet approved or cleared by the [place] FDA and  has been authorized for detection and/or diagnosis of SARS-CoV-2 by FDA under an Emergency Use Authorization (EUA). This EUA will remain  in effect (meaning this test can be used) for the duration of the COVID-19 declaration  under Section 56 4(b)(1) of the Act, 21 U.S.C. section 360bbb-3(b)(1), unless the authorization is terminated or revoked sooner. Performed at [facility], [address] [place], [place] [address]   Blood Culture (routine x 2)     Status: None (Preliminary result)   Collection Time: 02/06/19 11:28 AM   Specimen: BLOOD LEFT FOREARM  Result Value Ref Range Status   Specimen Description   Final    BLOOD LEFT FOREARM Performed at [facility], [address] [place], [place] [address]    Special Requests   Final    BOTTLES DRAWN AEROBIC AND ANAEROBIC Blood Culture results may not be optimal due to an excessive volume of blood received in culture bottles Performed at [facility], [address] [place], [place] [address]    Culture   Final    NO GROWTH 2 DAYS Performed at [facility], [address] [place], [place] [address]    Report Status PENDING  Incomplete  Urine culture     Status: None (Preliminary result)   Collection Time: 02/06/19  2:45 PM   Specimen: In/Out Cath Urine  Result Value Ref Range Status   Specimen Description   Final    IN/OUT CATH URINE Performed at [facility], [address] [place], [place] [address]    Special Requests   Final    NONE Performed at [facility], [address] [place], [place] [address]    Culture   Final    CULTURE REINCUBATED FOR BETTER GROWTH Performed at [facility], [address] [place], [place] [address]    Report Status PENDING  Incomplete  Surgical pcr screen     Status: None   Collection Time: 02/06/19  8:24 PM   Specimen: Nasal Mucosa; Nasal Swab  Result Value Ref Range Status   MRSA, PCR NEGATIVE NEGATIVE Final   Staphylococcus aureus NEGATIVE NEGATIVE Final    Comment: (NOTE) The Xpert SA Assay (FDA approved for NASAL specimens in patients 49 years of age and older), is one component of a comprehensive surveillance program. It is not intended to diagnose infection nor to guide or monitor treatment. Performed at [facility], [address] [place], [place] [address]          Radiology Studies: Dg Chest 1 View  Result Date: 02/06/2019 CLINICAL DATA:  Recently postop from panniculectomy. Chills. EXAM: CHEST  1 VIEW COMPARISON:  11/05/2018 FINDINGS: New opacity is seen in the medial left lower lobe, which may be due to atelectasis or pneumonia. Right lung is clear. No evidence of pleural effusion. Heart size remains normal. IMPRESSION: New opacity in medial left lower lobe, which may be due to postop atelectasis or pneumonia. Electronically Signed   By: Danae OrleansJohn A Stahl M.D.   On: 02/06/2019 15:05         Scheduled Meds: . B-complex with vitamin C  1 tablet Oral Daily  . buPROPion  300 mg Oral Daily  . cycloSPORINE  1 drop Both Eyes BID  . enoxaparin (LOVENOX) injection  40 mg Subcutaneous Q24H  . feeding supplement (PRO-STAT SUGAR FREE 64)  30 mL Oral BID  . gabapentin  300 mg Oral TID  . irbesartan  300 mg Oral Daily  . mirtazapine  15 mg Oral QHS  . montelukast  10 mg Oral QHS  . multivitamin with minerals  1 tablet Oral Daily  . nutrition supplement (JUVEN)  1 packet Oral BID BM  . pantoprazole  40 mg Oral BID  . Ensure Max Protein  11 oz Oral BID  . traZODone  50 mg Oral QHS  . venlafaxine XR  150 mg Oral Q breakfast  . vitamin A  10,000 Units Oral Daily  . vitamin B-12  1,000 mcg Oral Daily   Continuous Infusions: . sodium chloride 125 mL/hr at 02/07/19 1800  . sodium chloride 75 mL/hr at 02/07/19 2359  . ampicillin-sulbactam (UNASYN) IV 3 g (02/08/19 1208)  . doxycycline (VIBRAMYCIN) IV 100 mg (02/08/19 0727)     LOS: 2 days    Time spent: 25 minutes.    Berton Mount, MD  [facility] Pager #: [phone] 7PM-7AM contact night coverage as above

## 2019-02-08 NOTE — Telephone Encounter (Signed)
Pt called to request prescription to be written for a powered lift recliner that she can sleep in and also help her stand up. Insurance will pay for it if a prescription is submitted. Thanks!

## 2019-02-08 NOTE — Evaluation (Signed)
Physical Therapy Evaluation Patient Details Name: [name] MRN: [id number] DOB: 09-11-1970 Today's Date: 02/08/2019   History of Present Illness  48 y o female admitted for wound dehiscence.  10/19 had panniculectomy, R breast fat necrosis excision, and hernia repair.  Now s/p wound vac, debridement and A cell placement    Clinical Impression  [name] is 48 y.o. female admitted with above HPI and diagnosis. Patient is independent at baseline with no device to mobilize but has been limited with ADL's since initial surgery on 01/24/19. Patient is currently mobilizing at supervision level for all mobility and is safe to mobilize with RN staff during hospitalization. She does have notable weakness in bil LE's and was unable to complete 5x Sit to Stand test within normal time and would benefit from skilled HHPT to provide additional strengthening. Acute PT will sign off, please re-consult if there is a change in functional status.    Follow Up Recommendations Home health PT    Equipment Recommendations  None recommended by PT    Recommendations for Other Services       Precautions / Restrictions Precautions Precaution Comments: abdominal sx; JP drains, wound vac Restrictions Weight Bearing Restrictions: No      Mobility  Bed Mobility Overal bed mobility: Needs Assistance Bed Mobility: Supine to Sit;Sit to Supine     Supine to sit: Supervision Sit to supine: Supervision   General bed mobility comments: pt demonstrated safe sequencing for log roll technique to prevent straining on abdomen, no cues required  Transfers Overall transfer level: Needs assistance Equipment used: None Transfers: Sit to/from Stand Sit to Stand: Supervision         General transfer comment: pt safe with transfer from EOB and no unsteadiness or overt LOB noted  Ambulation/Gait Ambulation/Gait assistance: Supervision Gait Distance (Feet): 120 Feet Assistive device: None;IV  Pole Gait Pattern/deviations: Step-through pattern;Wide base of support;Trendelenburg Gait velocity: decreased   General Gait Details: patient wtih no overt LOB noted during gait with use of IV pole or without use, pt with noted trendelenburg bil due to LE hip weakness; pt able to complete walk/talk and turns with no LOB as well  Stairs            Wheelchair Mobility    Modified Rankin (Stroke Patients Only)       Balance Overall balance assessment: Needs assistance Sitting-balance support: Feet supported;No upper extremity supported Sitting balance-Leahy Scale: Good     Standing balance support: During functional activity;No upper extremity supported;Single extremity supported Standing balance-Leahy Scale: Good            Pertinent Vitals/Pain Pain Assessment: Faces Faces Pain Scale: Hurts a little bit Pain Location: abdomen Pain Descriptors / Indicators: Sore Pain Intervention(s): Limited activity within patient's tolerance;Monitored during session    Home Living Family/patient expects to be discharged to:: Private residence Living Arrangements: Parent Available Help at Discharge: Family Type of Home: House Home Access: Stairs to enter Entrance Stairs-Rails: Can reach both;Right;Left Entrance Stairs-Number of Steps: 5 Home Layout: One level Home Equipment: None Additional Comments: 90 year old mother lives with patient, she reports they help each other. Pt also has a finance    Prior Function Level of Independence: Independent               Hand Dominance   Dominant Hand: Right    Extremity/Trunk Assessment   Upper Extremity Assessment Upper Extremity Assessment: Defer to OT evaluation    Lower Extremity Assessment Lower Extremity  Assessment: Generalized weakness    Cervical / Trunk Assessment Cervical / Trunk Assessment: Normal  Communication   Communication: No difficulties  Cognition Arousal/Alertness: Awake/alert Behavior During  Therapy: WFL for tasks assessed/performed Overall Cognitive Status: Within Functional Limits for tasks assessed             General Comments General comments (skin integrity, edema, etc.): pt denies LOB or falls within the last 6 months    Exercises Other Exercises Other Exercises: 5x Sit<>Stand: 48.9 seconds with use of UE's to initiate power ups   Assessment/Plan    PT Assessment Patent does not need any further PT services  PT Problem List         PT Treatment Interventions      PT Goals (Current goals can be found in the Care Plan section)  Acute Rehab PT Goals Patient Stated Goal: return to independence PT Goal Formulation: With patient Time For Goal Achievement: 02/22/19 Potential to Achieve Goals: Good    Frequency Other (Comment)(1x eval/treat)   Barriers to discharge        Co-evaluation PT/OT/SLP Co-Evaluation/Treatment: Yes Reason for Co-Treatment: To address functional/ADL transfers PT goals addressed during session: Mobility/safety with mobility OT goals addressed during session: ADL's and self-care       AM-PAC PT "6 Clicks" Mobility  Outcome Measure Help needed turning from your back to your side while in a flat bed without using bedrails?: A Little Help needed moving from lying on your back to sitting on the side of a flat bed without using bedrails?: A Little Help needed moving to and from a bed to a chair (including a wheelchair)?: A Little Help needed standing up from a chair using your arms (e.g., wheelchair or bedside chair)?: A Little Help needed to walk in hospital room?: A Little Help needed climbing 3-5 steps with a railing? : A Little 6 Click Score: 18    End of Session Equipment Utilized During Treatment: Gait belt Activity Tolerance: Patient tolerated treatment well Patient left: with call bell/phone within reach;in bed Nurse Communication: Mobility status PT Visit Diagnosis: Muscle weakness (generalized) (M62.81);Difficulty in  walking, not elsewhere classified (R26.2)    Time: 2353-6144 PT Time Calculation (min) (ACUTE ONLY): 24 min   Charges:   PT Evaluation $PT Eval Low Complexity: 1 Low          Kipp Brood, PT, DPT Physical Therapist with [facility]  02/08/2019 5:48 PM

## 2019-02-08 NOTE — Progress Notes (Signed)
Pt has elevated heart rate at baseline

## 2019-02-08 NOTE — Progress Notes (Signed)
1 Day Post-Op  Subjective: Mrs. Roedel is 1 day post-op from debridement of abdominal wound, primary closure and placement of wound vac. She reports she is feeling a lot better this AM compared to preoperatively.  She has 2 JP drains in place, 110 cc out of R and 120 cc out of L per nursing. Drains having difficulty holding suction. Serosanguinous fluid in bulbs during evaluation.  No acute overnight events. Denies fever, chills. Abdominal pain is sharp in nature, no longer burning. Pain is intermittent. Wound vac in place, good seal noted. Serosanguinous fluid noted in canister, < 5 cc.  No BM yet. Urinating without difficulty, dysuria.  Objective: Vital signs in last 24 hours: Temp:  [97.5 F (36.4 C)-98.8 F (37.1 C)] 97.9 F (36.6 C) (11/03 0622) Pulse Rate:  [90-114] 99 (11/03 0622) Resp:  [9-20] 14 (11/03 0622) BP: (108-153)/(58-103) 116/68 (11/03 0622) SpO2:  [93 %-99 %] 96 % (11/03 0622) Weight:  [159.4 kg] 159.4 kg (11/02 1037) Last BM Date: 02/06/19  Intake/Output from previous day: 11/02 0701 - 11/03 0700 In: 0932 [P.O.:300; I.V.:2931.4; IV Piggyback:591.6] Out: 1780 [Urine:1500; Drains:230; Blood:50] Intake/Output this shift: Total I/O In: 1198.1 [P.O.:300; I.V.:456.5; IV Piggyback:441.6] Out: 1095 [Urine:1000; Drains:95]  General appearance: alert, cooperative, no distress and obese Head: Normocephalic, without obvious abnormality, atraumatic Resp: unlabored, symmetric rise and fall GI: mild tenderness to palpation, no distension noted, no erythema. Wound vac in place along transverse incision Extremities: extremities normal, minimal edema, No SCDs in place on exam. Pulses: 2+ and symmetric Neurologic: Grossly normal Incision/Wound: Transverse panniculectomy incision with wound vac and ioban tape in place - good seal noted. No peri-wound erythema noted. Serosanguinous fluid in wound vac canister. JP drains in place, difficulty holding suction.   Lab Results:   CBC    Component Value Date/Time   WBC 10.7 (H) 02/07/2019 0345   RBC 2.65 (L) 02/07/2019 0345   HGB 8.2 (L) 02/07/2019 0345   HCT 27.1 (L) 02/07/2019 0345   PLT 411 (H) 02/07/2019 0345   MCV 102.3 (H) 02/07/2019 0345   MCH 30.9 02/07/2019 0345   MCHC 30.3 02/07/2019 0345   RDW 14.2 02/07/2019 0345   LYMPHSABS 1.8 02/06/2019 1123   MONOABS 0.8 02/06/2019 1123   EOSABS 0.5 02/06/2019 1123   BASOSABS 0.1 02/06/2019 1123    BMET Recent Labs    02/06/19 1123 02/07/19 0345  NA 139 141  K 4.1 4.0  CL 105 107  CO2 25 25  GLUCOSE 120* 96  BUN 11 8  CREATININE 0.97 0.81  CALCIUM 8.1* 7.8*   PT/INR No results for input(s): LABPROT, INR in the last 72 hours. ABG No results for input(s): PHART, HCO3 in the last 72 hours.  Invalid input(s): PCO2, PO2  Studies/Results: Dg Chest 1 View  Result Date: 02/06/2019 CLINICAL DATA:  Recently postop from panniculectomy. Chills. EXAM: CHEST  1 VIEW COMPARISON:  11/05/2018 FINDINGS: New opacity is seen in the medial left lower lobe, which may be due to atelectasis or pneumonia. Right lung is clear. No evidence of pleural effusion. Heart size remains normal. IMPRESSION: New opacity in medial left lower lobe, which may be due to postop atelectasis or pneumonia. Electronically Signed   By: Marlaine Hind M.D.   On: 02/06/2019 15:05    Anti-infectives: Anti-infectives (From admission, onward)   Start     Dose/Rate Route Frequency Ordered Stop   02/07/19 1152  polymyxin B 500,000 Units, bacitracin 50,000 Units in sodium chloride 0.9 % 500 mL irrigation  Status:  Discontinued       As needed 02/07/19 1152 02/07/19 1501   02/07/19 0600  ceFAZolin (ANCEF) 3 g in dextrose 5 % 50 mL IVPB     3 g 100 mL/hr over 30 Minutes Intravenous On call to O.R. 02/06/19 1928 02/07/19 1132   02/06/19 1500  doxycycline (VIBRAMYCIN) 100 mg in sodium chloride 0.9 % 250 mL IVPB     100 mg 125 mL/hr over 120 Minutes Intravenous Every 12 hours 02/06/19 1422      02/06/19 1430  Ampicillin-Sulbactam (UNASYN) 3 g in sodium chloride 0.9 % 100 mL IVPB     3 g 200 mL/hr over 30 Minutes Intravenous Every 6 hours 02/06/19 1422     02/06/19 1200  vancomycin (VANCOCIN) 2,500 mg in sodium chloride 0.9 % 500 mL IVPB     2,500 mg 250 mL/hr over 120 Minutes Intravenous  Once 02/06/19 1147 02/06/19 1419   02/06/19 1130  vancomycin (VANCOCIN) IVPB 1000 mg/200 mL premix  Status:  Discontinued     1,000 mg 200 mL/hr over 60 Minutes Intravenous  Once 02/06/19 1123 02/06/19 1146   02/06/19 1130  cefTRIAXone (ROCEPHIN) 2 g in sodium chloride 0.9 % 100 mL IVPB     2 g 200 mL/hr over 30 Minutes Intravenous  Once 02/06/19 1123 02/06/19 1425      Assessment/Plan: s/p Procedure(s): DEBRIDEMENT OF ABDOMINAL WOUND, PLACEMENT OF A-CELL, PLACEMENT OF WOUND VAC  Hgb 8.2 this yesterday, monitor.  Avoid hyperextension of abdomen to avoid straining sutures and increasing risk of dehiscence. Unable to place abdominal binder due to binder being too small for patient. Patient would benefit from compression garment such as spanks that extend to upper abdomen.  Avoid sodas, sugar. increase protein with protein shakes.  Nursing to provide patient with incentive spirometer, patient understands importance of this and agrees to use.  Okay to restart lovenox for DVT ppx. SCDs. OOB to bathroom with assistance.  Expect to be discharged by end of week pending patients status and healing. Patient would benefit from continued admission for pain control and wound care.   LOS: 2 days    Leslee Home, PA-C 02/08/2019

## 2019-02-08 NOTE — Evaluation (Signed)
Occupational Therapy Evaluation Patient Details Name: [name] MRN: [id number] DOB: Sep 16, 1970 Today's Date: 02/08/2019    History of Present Illness 48 y o female admitted for wound dehiscence.  10/19 had panniculectomy, R breast fat necrosis excision, and hernia repair.  Now s/p wound vac, debridement and A cell placement   Clinical Impression   Pt was admitted for the above. At baseline, she is independent, but she has needed assistance with adls since sx.  Educated on and issued AE for increased independence. (Pt has medicaid as Consulting civil engineer, so I was able to issue this equipment).  No further OT is needed at this time    Follow Up Recommendations  Supervision - Intermittent    Equipment Recommendations  3 in 1 bedside commode(wide)    Recommendations for Other Services       Precautions / Restrictions Precautions Precaution Comments: abdominal sx; JP drains, wound vac; lifting restrictions Restrictions Weight Bearing Restrictions: No      Mobility Bed Mobility               General bed mobility comments: supervision for in/out of bed via sidelying  Transfers Overall transfer level: Needs assistance Equipment used: None Transfers: Sit to/from Stand Sit to Stand: Supervision              Balance                                           ADL either performed or assessed with clinical judgement   ADL Overall ADL's : Needs assistance/impaired Eating/Feeding: Independent   Grooming: Set up   Upper Body Bathing: Set up   Lower Body Bathing: Supervison/ safety;Sit to/from stand;With adaptive equipment   Upper Body Dressing : Set up   Lower Body Dressing: Supervision/safety;Sitting/lateral leans;With adaptive equipment   Toilet Transfer: Supervision/safety;Ambulation(bed)   Toileting- Clothing Manipulation and Hygiene: Maximal assistance         General ADL Comments: educated on AE and issued to her. Did not  practice with toilet aide.     Vision         Perception     Praxis      Pertinent Vitals/Pain Pain Assessment: Faces Faces Pain Scale: Hurts a little bit Pain Location: abdomen Pain Descriptors / Indicators: Sore Pain Intervention(s): Limited activity within patient's tolerance;Monitored during session     Hand Dominance     Extremity/Trunk Assessment Upper Extremity Assessment Upper Extremity Assessment: Generalized weakness           Communication     Cognition Arousal/Alertness: Awake/alert Behavior During Therapy: WFL for tasks assessed/performed Overall Cognitive Status: Within Functional Limits for tasks assessed                                     General Comments       Exercises     Shoulder Instructions      Home Living Family/patient expects to be discharged to:: Private residence Living Arrangements: Parent Available Help at Discharge: Family Type of Home: House Home Access: Stairs to enter Technical brewer of Steps: 5 Entrance Stairs-Rails: Can reach both Home Layout: One level     Bathroom Shower/Tub: Occupational psychologist: Standard     Home Equipment: None   Additional Comments: 95 year old mother  and she help each other. Pt also has a finance      Prior Functioning/Environment Level of Independence: Independent                 OT Problem List:        OT Treatment/Interventions:      OT Goals(Current goals can be found in the care plan section) Acute Rehab OT Goals Patient Stated Goal: return to independence OT Goal Formulation: All assessment and education complete, DC therapy  OT Frequency:     Barriers to D/C:            Co-evaluation PT/OT/SLP Co-Evaluation/Treatment: Yes Reason for Co-Treatment: To address functional/ADL transfers PT goals addressed during session: Mobility/safety with mobility OT goals addressed during session: ADL's and self-care      AM-PAC OT "6  Clicks" Daily Activity     Outcome Measure Help from another person eating meals?: None Help from another person taking care of personal grooming?: A Little Help from another person toileting, which includes using toliet, bedpan, or urinal?: A Little Help from another person bathing (including washing, rinsing, drying)?: A Little Help from another person to put on and taking off regular upper body clothing?: A Little Help from another person to put on and taking off regular lower body clothing?: A Little 6 Click Score: 19   End of Session    Activity Tolerance: Patient tolerated treatment well Patient left: in bed;with call bell/phone within reach  OT Visit Diagnosis: Muscle weakness (generalized) (M62.81)                Time: 3710-6269 OT Time Calculation (min): 25 min Charges:  OT General Charges $OT Visit: 1 Visit OT Evaluation $OT Eval Low Complexity: 1 Low  Marica Otter, OTR/L Acute Rehabilitation Services [phone] [facility] pager [phone] office 02/08/2019  Holly Pring 02/08/2019, 1:08 PM

## 2019-02-09 ENCOUNTER — Telehealth: Payer: Self-pay | Admitting: *Deleted

## 2019-02-09 DIAGNOSIS — L089 Local infection of the skin and subcutaneous tissue, unspecified: Secondary | ICD-10-CM

## 2019-02-09 DIAGNOSIS — T148XXA Other injury of unspecified body region, initial encounter: Secondary | ICD-10-CM

## 2019-02-09 MED ORDER — DOXYCYCLINE HYCLATE 100 MG PO TABS
100.0000 mg | ORAL_TABLET | Freq: Two times a day (BID) | ORAL | Status: DC
  Administered 2019-02-09 – 2019-02-10 (×2): 100 mg via ORAL
  Filled 2019-02-09 (×2): qty 1

## 2019-02-09 MED ORDER — ALPRAZOLAM 0.5 MG PO TABS
0.5000 mg | ORAL_TABLET | Freq: Three times a day (TID) | ORAL | Status: DC | PRN
  Administered 2019-02-09 – 2019-02-10 (×3): 0.5 mg via ORAL
  Filled 2019-02-09 (×3): qty 1

## 2019-02-09 NOTE — Progress Notes (Signed)
2 Days Post-Op  Subjective: [name] is doing better this AM. She reports walking in the unit yesterday 3 times and reportedly did well.  She is still having some abdominal pain, mild in nature, sharp. + BM, + producing urine.  Denies fever, chills, vomiting, dysuria, frequency, urgency, sob, cp.   Patient reports that she would have difficulty caring for herself at home and her mother is elderly and unable to assist her to the level that she needs at this time.   Objective: Vital signs in last 24 hours: Temp:  [97.5 F (36.4 C)-97.8 F (36.6 C)] 97.5 F (36.4 C) (11/04 0457) Pulse Rate:  [91-114] 91 (11/04 0457) Resp:  [14-16] 14 (11/04 0457) BP: (102-132)/(61-76) 122/71 (11/04 0457) SpO2:  [94 %-100 %] 97 % (11/04 0457) Weight:  [163.9 kg] 163.9 kg (11/04 0457) Last BM Date: 02/06/19  Intake/Output from previous day: 11/03 0701 - 11/04 0700 In: 4568 [P.O.:720; I.V.:2639.5; IV Piggyback:1208.4] Out: 7628 [Urine:3850; Drains:195] Intake/Output this shift: No intake/output data recorded.  General appearance: alert, cooperative, no distress, resting in bed. Head: Normocephalic, without obvious abnormality, atraumatic Resp: unlabored, symmetric rise and fall GI: mild tenderness to palpation, no distension noted, no erythema. Wound vac in place along transverse incision, slightly broken seal along left lateral ioban. Extremities: extremities normal, no edema noted. Pulses: 2+ and symmetric Neurologic: Grossly normal Incision/Wound: Transverse panniculectomy incision with wound vac and ioban tape in place - seal holding. No peri-wound erythema noted. Serosanguinous fluid in wound vac canister ~ 30 cc. JP drains in place, difficulty holding suction.   Lab Results:  CBC    Component Value Date/Time   WBC 10.7 (H) 02/07/2019 0345   RBC 2.65 (L) 02/07/2019 0345   HGB 8.2 (L) 02/07/2019 0345   HCT 27.1 (L) 02/07/2019 0345   PLT 411 (H) 02/07/2019 0345   MCV 102.3 (H)  02/07/2019 0345   MCH 30.9 02/07/2019 0345   MCHC 30.3 02/07/2019 0345   RDW 14.2 02/07/2019 0345   LYMPHSABS 1.8 02/06/2019 1123   MONOABS 0.8 02/06/2019 1123   EOSABS 0.5 02/06/2019 1123   BASOSABS 0.1 02/06/2019 1123    BMET Recent Labs    02/06/19 1123 02/07/19 0345  NA 139 141  K 4.1 4.0  CL 105 107  CO2 25 25  GLUCOSE 120* 96  BUN 11 8  CREATININE 0.97 0.81  CALCIUM 8.1* 7.8*   PT/INR No results for input(s): LABPROT, INR in the last 72 hours. ABG No results for input(s): PHART, HCO3 in the last 72 hours.  Invalid input(s): PCO2, PO2  Studies/Results: No results found.  Anti-infectives: Anti-infectives (From admission, onward)   Start     Dose/Rate Route Frequency Ordered Stop   02/07/19 1152  polymyxin B 500,000 Units, bacitracin 50,000 Units in sodium chloride 0.9 % 500 mL irrigation  Status:  Discontinued       As needed 02/07/19 1152 02/07/19 1501   02/07/19 0600  ceFAZolin (ANCEF) 3 g in dextrose 5 % 50 mL IVPB     3 g 100 mL/hr over 30 Minutes Intravenous On call to O.R. 02/06/19 1928 02/07/19 1132   02/06/19 1500  doxycycline (VIBRAMYCIN) 100 mg in sodium chloride 0.9 % 250 mL IVPB     100 mg 125 mL/hr over 120 Minutes Intravenous Every 12 hours 02/06/19 1422     02/06/19 1430  Ampicillin-Sulbactam (UNASYN) 3 g in sodium chloride 0.9 % 100 mL IVPB     3 g 200 mL/hr over 30  Minutes Intravenous Every 6 hours 02/06/19 1422     02/06/19 1200  vancomycin (VANCOCIN) 2,500 mg in sodium chloride 0.9 % 500 mL IVPB     2,500 mg 250 mL/hr over 120 Minutes Intravenous  Once 02/06/19 1147 02/06/19 1419   02/06/19 1130  vancomycin (VANCOCIN) IVPB 1000 mg/200 mL premix  Status:  Discontinued     1,000 mg 200 mL/hr over 60 Minutes Intravenous  Once 02/06/19 1123 02/06/19 1146   02/06/19 1130  cefTRIAXone (ROCEPHIN) 2 g in sodium chloride 0.9 % 100 mL IVPB     2 g 200 mL/hr over 30 Minutes Intravenous  Once 02/06/19 1123 02/06/19 1425      Assessment/Plan: s/p  Procedure(s): DEBRIDEMENT OF ABDOMINAL WOUND, PLACEMENT OF A-CELL, PLACEMENT OF WOUND VAC  Incision is intact at this time, wound vac in place, JP drains providing some assistance with suction - holding poor seal (reinforced today with vac tape).  Avoid hyperextension of abdomen to avoid straining sutures and increasing risk of dehiscence. Unable to place abdominal binder due to binder being too small for patient. Patient would benefit from compression garment such as spanks that extend to upper abdomen.  DVT ppx with lovenox. Continue use of incentive spirometer.  High protein, low sugar/carb diet. No sodas while patient is hospitalized, including ginger ale. Patient currently receiving pro-stat sugar free supplement and ensure.  Case management to assist with patient receiving walker, home health.  PO Doxycycline BID for 5 days started today for empiric post-op coverage.   UCx on admission + for pseudomas, patient asymptomatic. S/p IV doxycycline and transitioned to 5 days PO for post-op coverage.  Plan for d/c tomorrow or Friday to home pending patients pain and wound status.Patient would benefit from staying tonight for pain management, wound care and management/monitoring of large transverse abdominal incision due to previous issues with wound healing.  Appreciate assistance and management from medicine team.    LOS: 3 days    Leslee Home, PA-C 02/09/2019

## 2019-02-09 NOTE — Telephone Encounter (Signed)
Faxed order to [facility] for supplie's to be sent to the patient's home.  Confirmation received.//AB/CMA 

## 2019-02-09 NOTE — Care Management Important Message (Signed)
Important Message  Patient Details IM Letter given to Velva Harman RN to present to the Patient Name: [name] MRN: [id number] Date of Birth: October 08, 1970   Medicare Important Message Given:  Yes     Kerin Salen 02/09/2019, 10:32 AM

## 2019-02-09 NOTE — Telephone Encounter (Signed)
Received fax from [facility] stating that service have been provided to the patient.//AB/CMA

## 2019-02-09 NOTE — Progress Notes (Signed)
PROGRESS NOTE    [name]  VFI:[id number] DOB: 1971-02-25 DOA: 02/06/2019 PCP: Eartha Inch, MD    Brief Narrative:   48 year old female , morbidly obese with BMI > 49, Asthma, GERD , hypertension, depression admitted for wound dehiscence of the lower abdominal wall wound. Plastic surgery consulted and underwent debridement of abdominal wound and placement of A cell, placement of wound vac and JP drains. Appreciate plastic surgery recommendations.   Assessment & Plan:   Active Problems:   Wound dehiscence   Morbid obesity, with lower abdominal wall wound s/p wound dehiscence:  s/p debridement and A  Cell placement and wound vac placement and JP drains.  Pain control.  Further recommendations as per plastic surgery.  Transition IV UNASYN to oral doxy to complete the course.    Hypertension:  Well controlled. Resume home meds.   GERD stable.   Macrocytic anemia; hemoglobin around 8.  Transfuse to keep hemoglobin greater than 7.    Chronic indwelling foley catheter : Urine culture obtained on admission show pseudomonas.  Pt asymptomatic. We would not treat it at this time.      DVT prophylaxis: lovenox.  Code Status: full code.  Family Communication: none at bedside.  Disposition Plan: pending.    Consultants:   Plastic surgery.   Procedures: wound debridement by plastic surgery.   Antimicrobials: doxycycline to complete the course.    Subjective: Pt reports being anxious.   Objective: Vitals:   02/08/19 1343 02/08/19 2036 02/09/19 0457 02/09/19 1347  BP: 102/61 132/76 122/71 132/82  Pulse: (!) 109 (!) 107 91 98  Resp: 16 14 14 18   Temp: 97.6 F (36.4 C) 97.8 F (36.6 C) (!) 97.5 F (36.4 C) 97.6 F (36.4 C)  TempSrc: Oral Oral Oral Oral  SpO2: 94% 100% 97% 100%  Weight:   (!) 163.9 kg   Height:        Intake/Output Summary (Last 24 hours) at 02/09/2019 1407 Last data filed at 02/09/2019 1406 Gross per 24 hour  Intake 3102.3 ml   Output 3510 ml  Net -407.7 ml   Filed Weights   02/07/19 1037 02/08/19 0737 02/09/19 0457  Weight: (!) 159.4 kg (!) 161.8 kg (!) 163.9 kg    Examination:  General exam: Appears calm and comfortable  Respiratory system: Clear to auscultation. Respiratory effort normal. Cardiovascular system: S1 & S2 heard, RRR. No JVD,  Gastrointestinal system: Abdomen is soft, non tender, s/p wound vac and JP drain placement. Chronic foley catheter in place.  Central nervous system: Alert and oriented. No focal neurological deficits. Extremities: Symmetric 5 x 5 power. Skin: No rashes, lesions or ulcers Psychiatry: Mood & affect appropriate.     Data Reviewed: I have personally reviewed following labs and imaging studies  CBC: Recent Labs  Lab 02/06/19 1123 02/07/19 0345  WBC 12.2* 10.7*  NEUTROABS 8.8*  --   HGB 9.6* 8.2*  HCT 31.5* 27.1*  MCV 101.6* 102.3*  PLT 449* 411*   Basic Metabolic Panel: Recent Labs  Lab 02/06/19 1123 02/07/19 0345  NA 139 141  K 4.1 4.0  CL 105 107  CO2 25 25  GLUCOSE 120* 96  BUN 11 8  CREATININE 0.97 0.81  CALCIUM 8.1* 7.8*   GFR: Estimated Creatinine Clearance: 143.2 mL/min (by C-G formula based on SCr of 0.81 mg/dL). Liver Function Tests: Recent Labs  Lab 02/06/19 1123  AST 19  ALT 13  ALKPHOS 59  BILITOT 0.4  PROT 6.1*  ALBUMIN  2.4*   No results for input(s): LIPASE, AMYLASE in the last 168 hours. No results for input(s): AMMONIA in the last 168 hours. Coagulation Profile: No results for input(s): INR, PROTIME in the last 168 hours. Cardiac Enzymes: No results for input(s): CKTOTAL, CKMB, CKMBINDEX, TROPONINI in the last 168 hours. BNP (last 3 results) No results for input(s): PROBNP in the last 8760 hours. HbA1C: Recent Labs    02/07/19 0345  HGBA1C 5.8*   CBG: No results for input(s): GLUCAP in the last 168 hours. Lipid Profile: No results for input(s): CHOL, HDL, LDLCALC, TRIG, CHOLHDL, LDLDIRECT in the last 72 hours.  Thyroid Function Tests: No results for input(s): TSH, T4TOTAL, FREET4, T3FREE, THYROIDAB in the last 72 hours. Anemia Panel: No results for input(s): VITAMINB12, FOLATE, FERRITIN, TIBC, IRON, RETICCTPCT in the last 72 hours. Sepsis Labs: Recent Labs  Lab 02/06/19 1123 02/06/19 1426 02/06/19 1717 02/07/19 0345 02/08/19 0339  PROCALCITON  --   --  <0.10 <0.10 <0.10  LATICACIDVEN 2.1* 1.1 1.0  --   --     Recent Results (from the past 240 hour(s))  Blood Culture (routine x 2)     Status: None (Preliminary result)   Collection Time: 02/06/19 11:23 AM   Specimen: BLOOD  Result Value Ref Range Status   Specimen Description   Final    BLOOD LEFT ARM Performed at [facility] [address], [place], [place] [address]    Special Requests   Final    BOTTLES DRAWN AEROBIC AND ANAEROBIC Blood Culture results may not be optimal due to an excessive volume of blood received in culture bottles Performed at [facility] [address] [place], [place] [address]    Culture   Final    NO GROWTH 3 DAYS Performed at [facility], [place] [address], [place], [place] [address]    Report Status PENDING  Incomplete  SARS CORONAVIRUS 2 (TAT 6-24 HRS) Nasopharyngeal Nasopharyngeal Swab     Status: None   Collection Time: 02/06/19 11:24 AM   Specimen: Nasopharyngeal Swab  Result Value Ref Range Status   SARS Coronavirus 2 NEGATIVE NEGATIVE Final    Comment: (NOTE) SARS-CoV-2 target nucleic acids are NOT DETECTED. The SARS-CoV-2 RNA is generally detectable in upper and lower respiratory specimens during the acute phase of infection. Negative results do not preclude SARS-CoV-2 infection, do not rule out co-infections with other pathogens, and should not be used as the sole basis for treatment or other patient management decisions. Negative results must be combined with clinical observations, patient history, and epidemiological information. The expected  result is Negative. Fact Sheet for Patients: SugarRoll.be Fact Sheet for Healthcare Providers: https://www.woods-mathews.com/ This test is not yet approved or cleared by the [place] FDA and  has been authorized for detection and/or diagnosis of SARS-CoV-2 by FDA under an Emergency Use Authorization (EUA). This EUA will remain  in effect (meaning this test can be used) for the duration of the COVID-19 declaration under Section 56 4(b)(1) of the Act, 21 U.S.C. section 360bbb-3(b)(1), unless the authorization is terminated or revoked sooner. Performed at [facility], [place] [address], [place], [place] [address]   Blood Culture (routine x 2)     Status: None (Preliminary result)   Collection Time: 02/06/19 11:28 AM   Specimen: BLOOD LEFT FOREARM  Result Value Ref Range Status   Specimen Description   Final    BLOOD LEFT FOREARM Performed at [facility], [address] [place], [place] [address]    Special Requests   Final    BOTTLES DRAWN AEROBIC AND ANAEROBIC Blood Culture results may not be optimal due to an excessive volume of blood received in culture bottles Performed at [facility], [address] [place], [place] [address]    Culture   Final    NO GROWTH 3 DAYS Performed at [facility], [address] [place], [place] [address]    Report Status PENDING  Incomplete  Urine culture     Status: Abnormal (Preliminary result)   Collection Time: 02/06/19  2:45 PM   Specimen: In/Out Cath Urine  Result Value Ref Range Status   Specimen Description   Final    IN/OUT CATH URINE Performed at [facility], [address], [place], [place] [address]    Special Requests   Final    NONE Performed at [facility], [address], [place], [place] [address]    Culture (A)  Final    >=100,000 COLONIES/mL PSEUDOMONAS AERUGINOSA CULTURE REINCUBATED  FOR BETTER GROWTH Performed at [facility], [address], [place], [place] [address]    Report Status PENDING  Incomplete   Organism ID, Bacteria PSEUDOMONAS AERUGINOSA (A)  Final      Susceptibility   Pseudomonas aeruginosa - MIC*    CEFTAZIDIME 4 SENSITIVE Sensitive     CIPROFLOXACIN <=0.25 SENSITIVE Sensitive     GENTAMICIN <=1 SENSITIVE Sensitive     IMIPENEM 2 SENSITIVE Sensitive     PIP/TAZO 8 SENSITIVE Sensitive     CEFEPIME 2 SENSITIVE Sensitive     * >=100,000 COLONIES/mL PSEUDOMONAS AERUGINOSA  Surgical pcr screen     Status: None   Collection Time: 02/06/19  8:24 PM   Specimen: Nasal Mucosa; Nasal Swab  Result Value Ref Range Status   MRSA, PCR NEGATIVE NEGATIVE Final   Staphylococcus aureus NEGATIVE NEGATIVE Final    Comment: (NOTE) The Xpert SA Assay (FDA approved for NASAL specimens in patients 67 years of age and older), is one component of a comprehensive surveillance program. It is not intended to diagnose infection nor to guide or monitor treatment. Performed at [facility], [address] [place], [place] [address]          Radiology Studies: No results found.      Scheduled Meds: . B-complex with vitamin C  1 tablet Oral Daily  . buPROPion  300 mg Oral Daily  . cycloSPORINE  1 drop Both Eyes BID  . enoxaparin (LOVENOX) injection  40 mg Subcutaneous Q24H  . feeding supplement (PRO-STAT SUGAR FREE 64)  30 mL Oral BID  . gabapentin  300 mg Oral TID  . irbesartan  300 mg Oral Daily  . mirtazapine  15 mg Oral QHS  . montelukast  10 mg Oral QHS  . multivitamin with minerals  1 tablet Oral Daily  . nutrition supplement (JUVEN)  1 packet Oral BID BM  . pantoprazole  40 mg Oral BID  . Ensure Max Protein  11 oz Oral BID  . traZODone  50 mg Oral QHS  . venlafaxine XR  150 mg Oral Q breakfast  . vitamin A  10,000 Units Oral Daily  . vitamin B-12  1,000 mcg Oral Daily   Continuous Infusions: . sodium chloride 75 mL/hr  at 02/09/19 0315  . ampicillin-sulbactam (UNASYN) IV 3 g (02/09/19 1139)  . doxycycline (VIBRAMYCIN) IV 100 mg (02/09/19 0617)  LOS: 3 days        Kathlen ModyVijaya Randon Somera, MD [facility] Pager [phone]  If 7PM-7AM, please contact night-coverage [email or web address] Password [email or web address] 02/09/2019, 2:07 PM

## 2019-02-09 NOTE — TOC Progression Note (Addendum)
Transition of Care [facility]) - Progression Note    Patient Details  Name: [name] MRN: [id number] Date of Birth: June 05, 1970  [facility]) CM/SW Contact  Leeroy Cha, RN Phone Number: 02/09/2019, 12:14 PM  Clinical Narrative:    Talked with patient , wound vac at home is through [facility].  [facility] BUT DOES NOT KNOW THE NAME of the agency.  tct-To regina wilson at [facility] at [phone] to inquire as to who the hhc agency is.       Expected Discharge Plan and Services                                                 Social Determinants of Health (SDOH) Interventions    Readmission Risk Interventions No flowsheet data found.

## 2019-02-10 ENCOUNTER — Telehealth: Payer: Self-pay | Admitting: Plastic Surgery

## 2019-02-10 LAB — URINE CULTURE: Culture: >=100000 — AB

## 2019-02-10 MED ORDER — ENSURE MAX PROTEIN PO LIQD: 11.0000 [oz_av] | Freq: Two times a day (BID) | ORAL | Status: DC

## 2019-02-10 MED ORDER — DOXYCYCLINE HYCLATE 100 MG PO TABS: 100.0000 mg | ORAL_TABLET | Freq: Two times a day (BID) | ORAL | 0 refills | Status: AC

## 2019-02-10 MED ORDER — JUVEN PO PACK: 1.0000 | PACK | Freq: Two times a day (BID) | ORAL | 0 refills | Status: DC

## 2019-02-10 MED ORDER — PRO-STAT SUGAR FREE PO LIQD: 30.0000 mL | Freq: Two times a day (BID) | ORAL | 0 refills | Status: DC

## 2019-02-10 MED ORDER — FLUCONAZOLE 100 MG PO TABS
200.0000 mg | ORAL_TABLET | Freq: Once | ORAL | Status: AC
  Administered 2019-02-10: 10:00:00 200 mg via ORAL
  Filled 2019-02-10: qty 2

## 2019-02-10 NOTE — Progress Notes (Signed)
3 Days Post-Op  Subjective: [name] is 3 days post op from excision of abdominal wound, placement of Acell and VAC sponge.  No acute overnight events. Pain adequately controlled overnight without need for PO or IV pain medications. + BM. Denies cp, sob, fever, chills, dysuria. She is feeling well.  Objective: Vital signs in last 24 hours: Temp:  [97.6 F (36.4 C)-98 F (36.7 C)] 98 F (36.7 C) (11/05 0558) Pulse Rate:  [98-110] 98 (11/05 0558) Resp:  [15-18] 15 (11/05 0558) BP: (111-132)/(69-82) 123/79 (11/05 0558) SpO2:  [95 %-100 %] 95 % (11/05 0558) Weight:  [165.2 kg] 165.2 kg (11/05 0558) Last BM Date: 02/09/19  Intake/Output from previous day: 11/04 0701 - 11/05 0700 In: 1528.2 [P.O.:820; I.V.:600; IV Piggyback:108.2] Out: 1105 [Urine:900; Drains:205] Intake/Output this shift: Total I/O In: 100 [P.O.:100] Out: 60 [Drains:60]  General appearance:alert, cooperative, no distress, resting in bed. Head:Normocephalic, without obvious abnormality, atraumatic Resp:unlabored, symmetric rise and fall  Breast: Right breast incision c/d/i, well healed. JO:INOMVEH tenderness to palpation, no distension noted, no erythema. Wound vac in place along transverse incision - seal holding well. Extremities:extremities normal, no edema noted, 2+ DP pulses. Pulses:2+ and symmetric Neurologic:Grossly normal Incision/Wound: Transverse panniculectomy incision with wound vac and ioban tape in place - good seal noted. Prior JP drain wound site noted on RLQ abdomen/Upper R thigh. No peri-wound erythema noted. Serosanguinous fluid in wound vac canister ~ 40 cc. JP drains in place, difficulty holding suction, but still assisting with output. Dressing in place over JP drain insertion site bilaterally.  Lab Results:  CBC    Component Value Date/Time   WBC 10.7 (H) 02/07/2019 0345   RBC 2.65 (L) 02/07/2019 0345   HGB 8.2 (L) 02/07/2019 0345   HCT 27.1 (L) 02/07/2019 0345   PLT 411 (H)  02/07/2019 0345   MCV 102.3 (H) 02/07/2019 0345   MCH 30.9 02/07/2019 0345   MCHC 30.3 02/07/2019 0345   RDW 14.2 02/07/2019 0345   LYMPHSABS 1.8 02/06/2019 1123   MONOABS 0.8 02/06/2019 1123   EOSABS 0.5 02/06/2019 1123   BASOSABS 0.1 02/06/2019 1123    BMET No results for input(s): NA, K, CL, CO2, GLUCOSE, BUN, CREATININE, CALCIUM in the last 72 hours. PT/INR No results for input(s): LABPROT, INR in the last 72 hours. ABG No results for input(s): PHART, HCO3 in the last 72 hours.  Invalid input(s): PCO2, PO2  Studies/Results: No results found.  Anti-infectives: Anti-infectives (From admission, onward)   Start     Dose/Rate Route Frequency Ordered Stop   02/09/19 2000  doxycycline (VIBRA-TABS) tablet 100 mg     100 mg Oral 2 times daily 02/09/19 1524 02/14/19 1959   02/07/19 1152  polymyxin B 500,000 Units, bacitracin 50,000 Units in sodium chloride 0.9 % 500 mL irrigation  Status:  Discontinued       As needed 02/07/19 1152 02/07/19 1501   02/07/19 0600  ceFAZolin (ANCEF) 3 g in dextrose 5 % 50 mL IVPB     3 g 100 mL/hr over 30 Minutes Intravenous On call to O.R. 02/06/19 1928 02/07/19 1132   02/06/19 1500  doxycycline (VIBRAMYCIN) 100 mg in sodium chloride 0.9 % 250 mL IVPB  Status:  Discontinued     100 mg 125 mL/hr over 120 Minutes Intravenous Every 12 hours 02/06/19 1422 02/09/19 1524   02/06/19 1430  Ampicillin-Sulbactam (UNASYN) 3 g in sodium chloride 0.9 % 100 mL IVPB  Status:  Discontinued     3 g 200 mL/hr  over 30 Minutes Intravenous Every 6 hours 02/06/19 1422 02/09/19 1524   02/06/19 1200  vancomycin (VANCOCIN) 2,500 mg in sodium chloride 0.9 % 500 mL IVPB     2,500 mg 250 mL/hr over 120 Minutes Intravenous  Once 02/06/19 1147 02/06/19 1419   02/06/19 1130  vancomycin (VANCOCIN) IVPB 1000 mg/200 mL premix  Status:  Discontinued     1,000 mg 200 mL/hr over 60 Minutes Intravenous  Once 02/06/19 1123 02/06/19 1146   02/06/19 1130  cefTRIAXone (ROCEPHIN) 2 g in  sodium chloride 0.9 % 100 mL IVPB     2 g 200 mL/hr over 30 Minutes Intravenous  Once 02/06/19 1123 02/06/19 1425      Assessment/Plan: s/p Procedure(s): DEBRIDEMENT OF ABDOMINAL WOUND, PLACEMENT OF A-CELL, PLACEMENT OF WOUND VAC  [name] is feeling well and is stable for d/c today from stance of plastic surgery.  She is planning to go home and will receive assistance from family. She has home health scheduled, meals, protein shakes, a recliner for assistance with resting in a flexed position.   Avoid over extension of back/abdomen, decrease tension on abdominal sutures. Recommend vitamin C, Multivitamin. No sodas, sugary drinks. High protein diet.  No fever, chills, n/v. Wound vac in place - family bringing home vac upon d/c. Home health to assist with changes.   Appreciate input and medical management from medicine team during her stay.  Follow up in clinic in 1 week, call with questions or concerns.   LOS: 4 days    Charlies Constable, PA-C 02/10/2019

## 2019-02-10 NOTE — TOC Progression Note (Addendum)
Transition of Care [facility]) - Progression Note    Patient Details  Name: [name] MRN: [id number] Date of Birth: 1970-10-01  Transition of Care [facility]) CM/SW Contact  Leeroy Cha, RN Phone Number: 02/10/2019, 10:17 AM  Clinical Narrative:    rolliung walker send to [facility] tct-cheryl rose with [facility] to see if can do [facility] wcb. tcf-[facility]  Unable to take due to staffing. Send to [facility] with [facility]. Unable to take due to staffing/ [facility] unable to take [facility] is unable to take due to insurance and staffing. tct-Dr.Akula and patient suggest out patient wound care clinic at [facility] for wound vac changes. Expected Discharge Plan: [place] [facility] Barriers to Discharge: Other (comment)(hhc provider due to [employer])  Expected Discharge Plan and Services Expected Discharge Plan: [place]         Expected Discharge Date: 02/10/19               DME Arranged: Gilford Rile rolling DME Agency: [facility] Date DME Agency Contacted: 02/10/19 Time DME Agency Contacted: 50 Representative spoke with at DME Agency: [place] Arranged: RN, PT, OT, Nurse's Aide           Social Determinants of Health ([place]) Interventions    Readmission Risk Interventions No flowsheet data found.

## 2019-02-10 NOTE — Telephone Encounter (Signed)
[name] called in to let us know that her insurance is not easy to work with when it comes to home health so they told her she needed to come to the office for wound vac changes. She has an appt for her follow up but wasn't sure if she should wait that long to have the wound vac changed since it is supposed to be changed 2 times per week and it was last changed on Monday. Please call her to advise next steps. Thanks!

## 2019-02-10 NOTE — Discharge Summary (Signed)
Physician Discharge Summary  [name]:[id number] DOB: February 16, 1971 DOA: 02/06/2019  PCP: Chesley Noon, MD  Admit date: 02/06/2019 Discharge date: 02/10/2019  Admitted From: Home.  Disposition:  Home.   Recommendations for Outpatient Follow-up:  1. Follow up with PCP in 1-2 weeks 2. Please obtain BMP/CBC in one week 3. Please follow up with plastic surgery clinic for wound vac changes once every week.   Home Health:YES Equipment/Devices:walker.    Discharge Condition:GUARDED.  CODE STATUS:FULL CODE.  Diet recommendation: Heart Healthy   Brief/Interim Summary: 48 year old female , morbidly obese with BMI > 49, Asthma, GERD , hypertension, depression admitted for wound dehiscence of the lower abdominal wall wound. Plastic surgery consulted and underwent debridement of abdominal wound and placement of A cell, placement of wound vac and JP drains. Appreciate plastic surgery recommendations.   Discharge Diagnoses:  Active Problems:   Wound dehiscence  Morbid obesity, with lower abdominal wall wound s/p wound dehiscence:  s/p debridement and A  Cell placement and wound vac placement and JP drains.  Patient will follow up with plastic surgery clinic for wound vac change once every week.  Further recommendations as per plastic surgery.  Transition IV UNASYN to oral doxy to complete the course on discharge. Home health arranged.    Hypertension:  Well controlled. Resume home meds.   GERD stable.   Macrocytic anemia; hemoglobin around 8.  Transfuse to keep hemoglobin greater than 7.    Chronic indwelling foley catheter : Urine culture obtained on admission show pseudomonas.  Pt asymptomatic. We would not treat it at this time.    Discharge Instructions  Discharge Instructions    Diet - low sodium heart healthy   Complete by: As directed    Discharge instructions   Complete by: As directed    Please follow up with Plastic Surgery as recommended.   Please follow up with PCP in one week.     Allergies as of 02/10/2019      Reactions   Influenza Vaccines    States her "arm had a knot and turned red"   Lisinopril-hydrochlorothiazide    welts      Medication List    STOP taking these medications   cephALEXin 500 MG capsule Commonly known as: KEFLEX   diclofenac 75 MG EC tablet Commonly known as: VOLTAREN   HYDROcodone-acetaminophen 5-325 MG tablet Commonly known as: Norco   ibuprofen 200 MG tablet Commonly known as: ADVIL   ondansetron 4 MG tablet Commonly known as: Zofran     TAKE these medications   acetaminophen 500 MG tablet Commonly known as: TYLENOL Take 1,000 mg by mouth every 4 (four) hours as needed for fever or headache.   albuterol 108 (90 Base) MCG/ACT inhaler Commonly known as: VENTOLIN HFA Inhale 2 puffs into the lungs every 6 (six) hours as needed. For shortness of breath   ALPRAZolam 0.5 MG tablet Commonly known as: XANAX Take 0.5 mg by mouth 3 (three) times daily as needed for anxiety.   azelastine 0.1 % nasal spray Commonly known as: ASTELIN Place 2 sprays into both nostrils 2 (two) times daily as needed for rhinitis. Use in each nostril as directed   b complex vitamins tablet Take 1 tablet by mouth daily.   Biotin 10000 MCG Tabs Take 10,000 mcg by mouth daily.   buPROPion 300 MG 24 hr tablet Commonly known as: WELLBUTRIN XL Take 300 mg by mouth daily.   cetirizine 10 MG tablet Commonly known as: ZYRTEC  Take 10 mg by mouth daily as needed for allergies.   doxycycline 100 MG tablet Commonly known as: VIBRA-TABS Take 1 tablet (100 mg total) by mouth 2 (two) times daily for 5 days. What changed: when to take this   feeding supplement (PRO-STAT SUGAR FREE 64) Liqd Take 30 mLs by mouth 2 (two) times daily.   gabapentin 300 MG capsule Commonly known as: NEURONTIN Take 300 mg by mouth 3 (three) times daily.   ipratropium-albuterol 0.5-2.5 (3) MG/3ML Soln Commonly known as:  DUONEB Take 3 mLs by nebulization every 6 (six) hours as needed. What changed: reasons to take this   mirtazapine 15 MG tablet Commonly known as: REMERON Take 1 tablet (15 mg total) by mouth at bedtime. For mood control   multivitamin tablet Take 1 tablet by mouth daily.   nutrition supplement (JUVEN) Pack Take 1 packet by mouth 2 (two) times daily between meals.   Ensure Max Protein Liqd Take 330 mLs (11 oz total) by mouth 2 (two) times daily.   olmesartan 40 MG tablet Commonly known as: BENICAR Take 40 mg by mouth every morning.   polyethylene glycol 17 g packet Commonly known as: MIRALAX / GLYCOLAX Take 17 g by mouth daily as needed for mild constipation.   promethazine 25 MG tablet Commonly known as: PHENERGAN Take 25 mg by mouth every 8 (eight) hours as needed for nausea or vomiting.   Protonix 40 MG tablet Generic drug: pantoprazole Take 40 mg by mouth 2 (two) times daily.   Restasis 0.05 % ophthalmic emulsion Generic drug: cycloSPORINE Place 1 drop into both eyes 2 (two) times daily.   senna 8.6 MG tablet Commonly known as: SENOKOT Take 1 tablet by mouth daily as needed for constipation.   Singulair 10 MG tablet Generic drug: montelukast Take 10 mg by mouth at bedtime.   traZODone 50 MG tablet Commonly known as: DESYREL Take 1 tablet (50 mg total) by mouth at bedtime as needed for sleep. For sleep What changed: when to take this   venlafaxine XR 150 MG 24 hr capsule Commonly known as: EFFEXOR-XR Take 150 mg by mouth daily with breakfast.   vitamin A 8000 UNIT capsule Take 8,000 Units by mouth daily.   vitamin B-12 1000 MCG tablet Commonly known as: CYANOCOBALAMIN Take 1,000 mcg by mouth daily.   vitamin C 500 MG tablet Commonly known as: ASCORBIC ACID Take 500 mg by mouth daily.            Durable Medical Equipment  (From admission, onward)         Start     Ordered   02/10/19 0919  For home use only DME Walker  Once    Question:   Patient needs a walker to treat with the following condition  Answer:  Abdominal wound dehiscence   02/10/19 4098         Follow-up Information    Eartha Inch, MD. Schedule an appointment as soon as possible for a visit in 1 week(s).   Specialty: Family Medicine Contact information: [address] [place] [address] [phone]        Rollene Rotunda, MD .   Specialty: Cardiology Contact information: [address] [place] [address] [phone]          Allergies  Allergen Reactions  . Influenza Vaccines     States her "arm had a knot and turned red"  . Lisinopril-Hydrochlorothiazide     welts    Consultations:  Plastic surgery consult.    Procedures/Studies: Dg Chest 1 View  Result Date: 02/06/2019 CLINICAL DATA:  Recently postop from panniculectomy. Chills. EXAM: CHEST  1 VIEW COMPARISON:  11/05/2018 FINDINGS: New opacity is seen in the medial left lower lobe, which may be due to atelectasis or pneumonia. Right lung is clear. No evidence of pleural effusion. Heart size remains normal. IMPRESSION: New opacity in medial left lower lobe, which may be due to postop atelectasis or pneumonia. Electronically Signed   By: Danae Orleans M.D.   On: 02/06/2019 15:05       Subjective: No new complaints. Wants to go home as soon as possible.   Discharge Exam: Vitals:   02/10/19 0558 02/10/19 1340  BP: 123/79 119/72  Pulse: 98 97  Resp: 15 18  Temp: 98 F (36.7 C) 97.8 F (36.6 C)  SpO2: 95% 98%   Vitals:   02/09/19 1347 02/09/19 2049 02/10/19 0558 02/10/19 1340  BP: 132/82 111/69 123/79 119/72  Pulse: 98 (!) 110 98 97  Resp: Temp: 97.6 F (36.4 C) 97.8 F (36.6 C) 98 F (36.7 C) 97.8 F (36.6 C)  TempSrc: Oral Oral Oral Oral  SpO2: 100% 98% 95% 98%  Weight:   (!) 165.2 kg   Height:        General: Pt is alert, awake, not in acute distress Cardiovascular: RRR, S1/S2 +, no rubs, no gallops Respiratory:  CTA bilaterally, no wheezing, no rhonchi Abdominal: Soft, non distended. Wound vac inplace.  Extremities: no edema, no cyanosis    The results of significant diagnostics from this hospitalization (including imaging, microbiology, ancillary and laboratory) are listed below for reference.     Microbiology: Recent Results (from the past 240 hour(s))  Blood Culture (routine x 2)     Status: None (Preliminary result)   Collection Time: 02/06/19 11:23 AM   Specimen: BLOOD  Result Value Ref Range Status   Specimen Description   Final    BLOOD LEFT ARM Performed at [facility], [address], [place], [place] [address]    Special Requests   Final    BOTTLES DRAWN AEROBIC AND ANAEROBIC Blood Culture results may not be optimal due to an excessive volume of blood received in culture bottles Performed at [facility], [address], [place], [place] [address]    Culture   Final    NO GROWTH 4 DAYS Performed at [facility], [address] [place], [place] [address]    Report Status PENDING  Incomplete  SARS CORONAVIRUS 2 (TAT 6-24 HRS) Nasopharyngeal Nasopharyngeal Swab     Status: None   Collection Time: 02/06/19 11:24 AM   Specimen: Nasopharyngeal Swab  Result Value Ref Range Status   SARS Coronavirus 2 NEGATIVE NEGATIVE Final    Comment: (NOTE) SARS-CoV-2 target nucleic acids are NOT DETECTED. The SARS-CoV-2 RNA is generally detectable in upper and lower respiratory specimens during the acute phase of infection. Negative results do not preclude SARS-CoV-2 infection, do not rule out co-infections with other pathogens, and should not be used as the sole basis for treatment or other patient management decisions. Negative results must be combined with clinical observations, patient history, and epidemiological information. The expected result is Negative. Fact Sheet for Patients: HairSlick.no Fact Sheet for  Healthcare Providers: quierodirigir.com This test is not yet approved or cleared by the [place] FDA and  has been authorized for detection and/or diagnosis of SARS-CoV-2 by FDA under an  Emergency Use Authorization (EUA). This EUA will remain  in effect (meaning this test can be used) for the duration of the COVID-19 declaration under Section 56 4(b)(1) of the Act, 21 U.S.C. section 360bbb-3(b)(1), unless the authorization is terminated or revoked sooner. Performed at [facility], [address] [place], [place] [address]   Blood Culture (routine x 2)     Status: None (Preliminary result)   Collection Time: 02/06/19 11:28 AM   Specimen: BLOOD LEFT FOREARM  Result Value Ref Range Status   Specimen Description   Final    BLOOD LEFT FOREARM Performed at [facility], [address] [place], [place] [address]    Special Requests   Final    BOTTLES DRAWN AEROBIC AND ANAEROBIC Blood Culture results may not be optimal due to an excessive volume of blood received in culture bottles Performed at [facility], [address] [place], [place] [address]    Culture   Final    NO GROWTH 4 DAYS Performed at [facility], [address] [place], [place] [address]    Report Status PENDING  Incomplete  Urine culture     Status: Abnormal   Collection Time: 02/06/19  2:45 PM   Specimen: In/Out Cath Urine  Result Value Ref Range Status   Specimen Description   Final    IN/OUT CATH URINE Performed at [facility], [address] [place], [place] [address]    Special Requests   Final    NONE Performed at [facility], [address] [place], [place] [address]    Culture (A)  Final    >=100,000 COLONIES/mL PSEUDOMONAS AERUGINOSA 50,000 COLONIES/mL AEROCOCCUS VIRIDANS Standardized susceptibility testing for this organism is not available.    Report Status 02/10/2019 FINAL   Final   Organism ID, Bacteria PSEUDOMONAS AERUGINOSA (A)  Final      Susceptibility   Pseudomonas aeruginosa - MIC*    CEFTAZIDIME 4 SENSITIVE Sensitive     CIPROFLOXACIN <=0.25 SENSITIVE Sensitive     GENTAMICIN <=1 SENSITIVE Sensitive     IMIPENEM 2 SENSITIVE Sensitive     PIP/TAZO 8 SENSITIVE Sensitive     CEFEPIME 2 SENSITIVE Sensitive     * >=100,000 COLONIES/mL PSEUDOMONAS AERUGINOSA  Surgical pcr screen     Status: None   Collection Time: 02/06/19  8:24 PM   Specimen: Nasal Mucosa; Nasal Swab  Result Value Ref Range Status   MRSA, PCR NEGATIVE NEGATIVE Final   Staphylococcus aureus NEGATIVE NEGATIVE Final    Comment: (NOTE) The Xpert SA Assay (FDA approved for NASAL specimens in patients 322 years of age and older), is one component of a comprehensive surveillance program. It is not intended to diagnose infection nor to guide or monitor treatment. Performed at [facility], [address] [place], [place] [address]      Labs: BNP (last 3 results) No results for input(s): BNP in the last 8760 hours. Basic Metabolic Panel: Recent Labs  Lab 02/06/19 1123 02/07/19 0345  NA 139 141  K 4.1 4.0  CL 105 107  CO2 25 25  GLUCOSE 120* 96  BUN 11 8  CREATININE 0.97 0.81  CALCIUM 8.1* 7.8*   Liver Function Tests: Recent Labs  Lab 02/06/19 1123  AST 19  ALT 13  ALKPHOS 59  BILITOT 0.4  PROT 6.1*  ALBUMIN 2.4*   No results for input(s): LIPASE, AMYLASE in the last 168 hours. No  results for input(s): AMMONIA in the last 168 hours. CBC: Recent Labs  Lab 02/06/19 1123 02/07/19 0345  WBC 12.2* 10.7*  NEUTROABS 8.8*  --   HGB 9.6* 8.2*  HCT 31.5* 27.1*  MCV 101.6* 102.3*  PLT 449* 411*   Cardiac Enzymes: No results for input(s): CKTOTAL, CKMB, CKMBINDEX, TROPONINI in the last 168 hours. BNP: Invalid input(s): POCBNP CBG: No results for input(s): GLUCAP in the last 168 hours. D-Dimer No results for input(s): DDIMER in the last 72  hours. Hgb A1c No results for input(s): HGBA1C in the last 72 hours. Lipid Profile No results for input(s): CHOL, HDL, LDLCALC, TRIG, CHOLHDL, LDLDIRECT in the last 72 hours. Thyroid function studies No results for input(s): TSH, T4TOTAL, T3FREE, THYROIDAB in the last 72 hours.  Invalid input(s): FREET3 Anemia work up No results for input(s): VITAMINB12, FOLATE, FERRITIN, TIBC, IRON, RETICCTPCT in the last 72 hours. Urinalysis    Component Value Date/Time   COLORURINE YELLOW 02/06/2019 1445   APPEARANCEUR CLEAR 02/06/2019 1445   LABSPEC 1.028 02/06/2019 1445   PHURINE 6.0 02/06/2019 1445   GLUCOSEU NEGATIVE 02/06/2019 1445   HGBUR NEGATIVE 02/06/2019 1445   BILIRUBINUR NEGATIVE 02/06/2019 1445   KETONESUR NEGATIVE 02/06/2019 1445   PROTEINUR NEGATIVE 02/06/2019 1445   UROBILINOGEN 0.2 02/01/2011 2209   NITRITE NEGATIVE 02/06/2019 1445   LEUKOCYTESUR NEGATIVE 02/06/2019 1445   Sepsis Labs Invalid input(s): PROCALCITONIN,  WBC,  LACTICIDVEN Microbiology Recent Results (from the past 240 hour(s))  Blood Culture (routine x 2)     Status: None (Preliminary result)   Collection Time: 02/06/19 11:23 AM   Specimen: BLOOD  Result Value Ref Range Status   Specimen Description   Final    BLOOD LEFT ARM Performed at [facility], [address], [place], [place] [address]    Special Requests   Final    BOTTLES DRAWN AEROBIC AND ANAEROBIC Blood Culture results may not be optimal due to an excessive volume of blood received in culture bottles Performed at [facility], [address] [place], [place] [address]    Culture   Final    NO GROWTH 4 DAYS Performed at [facility], [address], [place], [place] [address]    Report Status PENDING  Incomplete  SARS CORONAVIRUS 2 (TAT 6-24 HRS) Nasopharyngeal Nasopharyngeal Swab     Status: None   Collection Time: 02/06/19 11:24 AM   Specimen: Nasopharyngeal Swab  Result Value Ref Range  Status   SARS Coronavirus 2 NEGATIVE NEGATIVE Final    Comment: (NOTE) SARS-CoV-2 target nucleic acids are NOT DETECTED. The SARS-CoV-2 RNA is generally detectable in upper and lower respiratory specimens during the acute phase of infection. Negative results do not preclude SARS-CoV-2 infection, do not rule out co-infections with other pathogens, and should not be used as the sole basis for treatment or other patient management decisions. Negative results must be combined with clinical observations, patient history, and epidemiological information. The expected result is Negative. Fact Sheet for Patients: HairSlick.no Fact Sheet for Healthcare Providers: quierodirigir.com This test is not yet approved or cleared by the [place] FDA and  has been authorized for detection and/or diagnosis of SARS-CoV-2 by FDA under an Emergency Use Authorization (EUA). This EUA will remain  in effect (meaning this test can be used) for the duration of the COVID-19 declaration under Section 56 4(b)(1) of the Act, 21 U.S.C. section 360bbb-3(b)(1), unless the authorization is terminated or revoked sooner. Performed at [facility], [address], [place], [place] [address]   Blood Culture (routine x 2)     Status: None (Preliminary result)   Collection Time: 02/06/19 11:28 AM   Specimen: BLOOD LEFT FOREARM  Result Value Ref Range Status   Specimen Description   Final    BLOOD LEFT FOREARM Performed at [facility], [address] [place], [place] [address]    Special Requests   Final    BOTTLES DRAWN AEROBIC AND ANAEROBIC Blood Culture results may not be optimal due to an excessive volume of blood received in culture bottles Performed at [facility], [address] [place], [place] [address]    Culture   Final    NO GROWTH 4 DAYS Performed at [facility], [address],  [place], [place] [address]    Report Status PENDING  Incomplete  Urine culture     Status: Abnormal   Collection Time: 02/06/19  2:45 PM   Specimen: In/Out Cath Urine  Result Value Ref Range Status   Specimen Description   Final    IN/OUT CATH URINE Performed at [facility], [address], [place], [place] [address]    Special Requests   Final    NONE Performed at [facility], [address] [place], [place] [address]    Culture (A)  Final    >=100,000 COLONIES/mL PSEUDOMONAS AERUGINOSA 50,000 COLONIES/mL AEROCOCCUS VIRIDANS Standardized susceptibility testing for this organism is not available.    Report Status 02/10/2019 FINAL  Final   Organism ID, Bacteria PSEUDOMONAS AERUGINOSA (A)  Final      Susceptibility   Pseudomonas aeruginosa - MIC*    CEFTAZIDIME 4 SENSITIVE Sensitive     CIPROFLOXACIN <=0.25 SENSITIVE Sensitive     GENTAMICIN <=1 SENSITIVE Sensitive     IMIPENEM 2 SENSITIVE Sensitive     PIP/TAZO 8 SENSITIVE Sensitive     CEFEPIME 2 SENSITIVE Sensitive     * >=100,000 COLONIES/mL PSEUDOMONAS AERUGINOSA  Surgical pcr screen     Status: None   Collection Time: 02/06/19  8:24 PM   Specimen: Nasal Mucosa; Nasal Swab  Result Value Ref Range Status   MRSA, PCR NEGATIVE NEGATIVE Final   Staphylococcus aureus NEGATIVE NEGATIVE Final    Comment: (NOTE) The Xpert SA Assay (FDA approved for NASAL specimens in patients 32 years of age and older), is one component of a comprehensive surveillance program. It is not intended to diagnose infection nor to guide or monitor treatment. Performed at [facility], [address], [place], [place] [address]      Time coordinating discharge: 38 minutes  SIGNED:   Kathlen Mody, MD  [facility] 02/10/2019, 2:30 PM Pager   If 7PM-7AM, please contact night-coverage [email or web address] Password [email or web address]

## 2019-02-11 LAB — CULTURE, BLOOD (ROUTINE X 2)
Culture: NO GROWTH
Culture: NO GROWTH

## 2019-02-11 NOTE — Telephone Encounter (Signed)
Matt PA had spoken with case management in the hospital, patient reports she has a chair that she received second-hand

## 2019-02-11 NOTE — Telephone Encounter (Signed)
[facility] desk staff called to inform [name] that she can come in next week for vac change

## 2019-02-11 NOTE — Telephone Encounter (Signed)
Patient called to follow up on message below as she has not heard back regarding vac changes. I advised her that clinical staff will call her back today.

## 2019-02-14 ENCOUNTER — Ambulatory Visit (INDEPENDENT_AMBULATORY_CARE_PROVIDER_SITE_OTHER): Payer: Medicare HMO | Admitting: Nurse Practitioner

## 2019-02-14 ENCOUNTER — Encounter: Payer: Self-pay | Admitting: Nurse Practitioner

## 2019-02-14 ENCOUNTER — Other Ambulatory Visit: Payer: Self-pay

## 2019-02-14 VITALS — BP 109/74 | HR 122 | Temp 97.3°F | Ht 71.0 in

## 2019-02-14 DIAGNOSIS — T8131XD Disruption of external operation (surgical) wound, not elsewhere classified, subsequent encounter: Secondary | ICD-10-CM

## 2019-02-14 DIAGNOSIS — M793 Panniculitis, unspecified: Secondary | ICD-10-CM

## 2019-02-14 NOTE — Progress Notes (Signed)
Patient ID: [name], female    DOB: 01-31-1971, 48 y.o.   MRN: [id number]   C.C.: wound vac change  [name] is a 48 yo female who underwent panniculectomy and excision of right breast fat necrosis by Dr. Marla Roe on 01/24/19.  Dr. Hassell Done performed a laparoscopic assisted ventral hernia repair at the same time. Patient had dehiscence of the abdominal wound and underwent debridement of the wound, placement of Acell, and placement of wound vac on 02/07/19. Patient presents today for her first wound vac change. Patient is accompanied by her mother. Patient states she feels much better overall. Patient states she has had trouble with the vac seal and has had to patch the dressing multiple times. Patient states the right JP drain is draining about 10 ml/day, but has trouble staying charged. Patient states the left drain has "a little more output," but the site is "constantly leaking." Patient states "if you don't remove the drains today, I'm going to take them out at home." Patient states "I know myself and I just can't handle the drains anymore." Patient's mother states, "I know she'll do it." Patient's mother states "the wound looks so much better."   Review of Systems  Constitutional: Positive for activity change.  HENT: Negative.   Respiratory: Negative.   Cardiovascular: Negative.   Gastrointestinal: Negative.   Genitourinary: Negative.   Musculoskeletal: Negative.   Skin: Positive for wound.  Neurological: Negative.     Past Medical History:  Diagnosis Date   Anxiety    Arthritis    joints   Asthma    Endometriosis    Fat necrosis of breast    right   GERD (gastroesophageal reflux disease)    History of hiatal hernia    s/p repair few times   History of suicide attempt 03/2017   Hypertension    MDD (major depressive disorder)    Panniculitis    Pneumonia 10/2018   in hospital with pneumonia   PONV (postoperative nausea and vomiting)     Psoriasis    treated with Humeria    Past Surgical History:  Procedure Laterality Date   ACHILLES TENDON REPAIR Left 01-11-2015  @[facility]    BREAST CYST EXCISION Right 01/24/2019   Procedure: EXCISIN OF RIGHT BREAST FAT NECROSIS;  Surgeon: Wallace Going, DO;  Location: [facility];  Service: Plastics;  Laterality: Right;   BREAST REDUCTION SURGERY Bilateral 01/09/2016   Procedure: MAMMARY REDUCTION  (BREAST) BILATERAL;  Surgeon: Wallace Going, DO;  Location: [facility];  Service: Plastics;  Laterality: Bilateral;   CARPOMETACARPAL (CMC) FUSION OF THUMB Right 05-04-2015   @[facility]    COMBINED HYSTEROSCOPY DIAGNOSTIC / D&C  06-23-2003     dr Ulanda Edison @[facility]    D & C HYSTERSCOPY /  DX LAPAROSCOPY CONVERSION LAPAROTOMY LYSIS ADHESIONS WITH RIGHT SALPINECTOMY  12-26-2003    dr Quincy Simmonds  @[facility]    DEBRIDEMENT AND CLOSURE WOUND N/A 02/07/2019   Procedure: DEBRIDEMENT OF ABDOMINAL WOUND, PLACEMENT OF A-CELL, PLACEMENT OF WOUND VAC;  Surgeon: Wallace Going, DO;  Location: [facility];  Service: Plastics;  Laterality: N/A;   LAPAROSCOPIC CHOLECYSTECTOMY  10-19-2002   dr Deon Pilling  @[facility]    LAPAROSCOPIC GASTRIC BANDING  09-25-2003  @[facility]    WITH HIATAL HERNIA REPAIR   LAPAROSCOPIC LYSIS OF ADHESIONS N/A 01/24/2019   Procedure: LAPAROSCOPY WITH LYSIS OF ADHESIONS;  Surgeon: Johnathan Hausen, MD;  Location: [facility];  Service: General;  Laterality: N/A;   LAPAROSCOPIC REPAIR AND REMOVAL OF GASTRIC  BAND  02-06-2006  @[facility]    LAPAROTOMY LYSIS ADHESIONS , UTERINE BIOPSY  01-31-2000   dr lowe  @[facility]    OPEN WOUND DEBRIDEMENT ANTERIOR ABDOMINAL WALL , REMOVAL Oswald HillockFORGEIN BODY'S  05-10-2001   dr Orson Slickbowman  @[facility]    PANNICULECTOMY N/A 01/24/2019   Procedure: PANNICULECTOMY;  Surgeon: Peggye Formillingham, Claire S, DO;  Location: [facility];  Service: Plastics;  Laterality: N/A;   REPAIR RECURRENT  HIATAL HERNIA   04-23-2004  @[facility]    TAKEDOWN RECURRENT HIATAL HERNIA FROM PREVIOUS MESH REPAIR OF DIAPHRAGM/ REPAIR HIATAL HERNIA AND THE  GASTROPEXY  11/05/2004   TONSILLECTOMY  age 84   TOTAL ABDOMINAL HYSTERECTOMY W/ BILATERAL SALPINGOOPHORECTOMY  12-23-2005   dr soper   W/  BILATERAL URETEROLYSIS AND EXTENSIVE ABD. LYSIS ADHESIONS   UMBILICAL HERNIA REPAIR  12/27/2009   LAPAROSCOPY TAKEDOWN INCARCERATED UMBILICAL HERNIA WITH REPAIR/ OPEN REPAIR COMPLEX LOWER INCISIONAL HERNIA      Current Outpatient Medications:    acetaminophen (TYLENOL) 500 MG tablet, Take 1,000 mg by mouth every 4 (four) hours as needed for fever or headache., Disp: , Rfl:    albuterol (PROVENTIL HFA;VENTOLIN HFA) 108 (90 BASE) MCG/ACT inhaler, Inhale 2 puffs into the lungs every 6 (six) hours as needed. For shortness of breath, Disp: , Rfl:    ALPRAZolam (XANAX) 0.5 MG tablet, Take 0.5 mg by mouth 3 (three) times daily as needed for anxiety. , Disp: , Rfl:    Amino Acids-Protein Hydrolys (FEEDING SUPPLEMENT, PRO-STAT SUGAR FREE 64,) LIQD, Take 30 mLs by mouth 2 (two) times daily., Disp: 887 mL, Rfl: 0   azelastine (ASTELIN) 137 MCG/SPRAY nasal spray, Place 2 sprays into both nostrils 2 (two) times daily as needed for rhinitis. Use in each nostril as directed, Disp: , Rfl:    b complex vitamins tablet, Take 1 tablet by mouth daily., Disp: , Rfl:    Biotin [id number] MCG TABS, Take 10,000 mcg by mouth daily., Disp: , Rfl:    buPROPion (WELLBUTRIN XL) 300 MG 24 hr tablet, Take 300 mg by mouth daily., Disp: , Rfl:    cetirizine (ZYRTEC) 10 MG tablet, Take 10 mg by mouth daily as needed for allergies. , Disp: , Rfl:    doxycycline (VIBRA-TABS) 100 MG tablet, Take 1 tablet (100 mg total) by mouth 2 (two) times daily for 5 days., Disp: 10 tablet, Rfl: 0   Ensure Max Protein (ENSURE MAX PROTEIN) LIQD, Take 330 mLs (11 oz total) by mouth 2 (two) times daily., Disp:  , Rfl:    gabapentin (NEURONTIN) 300 MG capsule, Take 300 mg by mouth 3 (three) times daily., Disp: , Rfl:    ipratropium-albuterol (DUONEB) 0.5-2.5 (3) MG/3ML SOLN, Take 3 mLs by nebulization  every 6 (six) hours as needed. (Patient taking differently: Take 3 mLs by nebulization every 6 (six) hours as needed (asthma). ), Disp: 360 mL, Rfl: 0   mirtazapine (REMERON) 15 MG tablet, Take 1 tablet (15 mg total) by mouth at bedtime. For mood control, Disp: 30 tablet, Rfl: 0   montelukast (SINGULAIR) 10 MG tablet, Take 10 mg by mouth at bedtime. , Disp: , Rfl:    Multiple Vitamin (MULTIVITAMIN) tablet, Take 1 tablet by mouth daily., Disp: , Rfl:    nutrition supplement, JUVEN, (JUVEN) PACK, Take 1 packet by mouth 2 (two) times daily between meals., Disp: , Rfl: 0   olmesartan (BENICAR) 40 MG tablet, Take 40 mg by mouth every morning. , Disp: , Rfl:    pantoprazole (PROTONIX) 40 MG tablet, Take 40 mg  by mouth 2 (two) times daily. , Disp: , Rfl:    polyethylene glycol (MIRALAX / GLYCOLAX) 17 g packet, Take 17 g by mouth daily as needed for mild constipation., Disp: 14 each, Rfl: 0   promethazine (PHENERGAN) 25 MG tablet, Take 25 mg by mouth every 8 (eight) hours as needed for nausea or vomiting. , Disp: , Rfl:    RESTASIS 0.05 % ophthalmic emulsion, Place 1 drop into both eyes 2 (two) times daily., Disp: , Rfl:    senna (SENOKOT) 8.6 MG tablet, Take 1 tablet by mouth daily as needed for constipation., Disp: , Rfl:    traZODone (DESYREL) 50 MG tablet, Take 1 tablet (50 mg total) by mouth at bedtime as needed for sleep. For sleep (Patient taking differently: Take 50 mg by mouth at bedtime. For sleep), Disp: 30 tablet, Rfl: 0   venlafaxine XR (EFFEXOR-XR) 150 MG 24 hr capsule, Take 150 mg by mouth daily with breakfast. , Disp: , Rfl:    vitamin A 8000 UNIT capsule, Take 8,000 Units by mouth daily., Disp: , Rfl:    vitamin B-12 (CYANOCOBALAMIN) 1000 MCG tablet, Take 1,000 mcg by mouth daily., Disp: , Rfl:    vitamin C (ASCORBIC ACID) 500 MG tablet, Take 500 mg by mouth daily., Disp: , Rfl:    Objective:   Vitals:   02/14/19 0839  BP: 109/74  Pulse: (!) 122  Temp: (!) 97.3 F (36.3  C)  SpO2: 98%    Physical Exam  General: alert, calm, obese, no acute distress HEENT:normocephalic Neck: supple, full ROM Chest: symmetrical rise and fall Lungs: unlabored breathing Abdomen: obese, ~60 cm partially approximated transverse incision along lower abdomen covered by wound vac, JP drain sutured to left lower flank with 20 ml serosanguinous drainage in bulb, moderate amount cloudy drainage from insertion site; JP drain sutured to right lower flank with 5 ml serosanguinous drainage in bulb, moderate amount serous drainage on gauze; 1.5 cm x 1 cm open wound inferior to incision on right lower groin; no erythema at incision site  Musculoskeletal: MAEx4 Neuro: A&O x3, calm, cooperative, steady gait Skin: warm   Assessment & Plan:  Postoperative wound dehiscence, subsequent encounter  Panniculitis   [name] is a 48 yo female POD #7 s/p debridement of abdominal wound, placement of Acell, and placement of wound vac after undergoing panniculectomy on 01/24/19. The wound vac was removed and replaced. The wound is healing nicely. The right JP drain was removed due to decreased drainage output and difficulty maintaining a charge. Patient was informed the left JP drain is not ready to be removed. Patient repeatedly stated she planned to remove the drain herself once she returned home. I repeatedly informed patient she would be doing this against provider recommendation. I discussed the fact that although the output in the bulb has decreased, the drain itself provides an opening for fluid to come out. I informed patient that removing the drain today could lead to further complications. Patient stated she understood. Both JP drains were removed and covered with the wound vac. Dr. Ulice Bold was notified of this encounter prior to removing drains. Encounter witnessed by Carmel Sacramento, CMA.    Return in 1 week.    Iantha Fallen, NP

## 2019-02-22 ENCOUNTER — Other Ambulatory Visit: Payer: Self-pay

## 2019-02-22 ENCOUNTER — Encounter: Payer: Self-pay | Admitting: Surgical

## 2019-02-22 ENCOUNTER — Ambulatory Visit (INDEPENDENT_AMBULATORY_CARE_PROVIDER_SITE_OTHER): Payer: Medicare HMO | Admitting: Surgical

## 2019-02-22 VITALS — BP 95/65 | HR 90 | Temp 97.8°F

## 2019-02-22 DIAGNOSIS — T8131XD Disruption of external operation (surgical) wound, not elsewhere classified, subsequent encounter: Secondary | ICD-10-CM

## 2019-02-22 DIAGNOSIS — M793 Panniculitis, unspecified: Secondary | ICD-10-CM

## 2019-02-22 MED ORDER — FLUCONAZOLE 150 MG PO TABS: 150.0000 mg | ORAL_TABLET | Freq: Once | ORAL | 0 refills | Status: AC

## 2019-02-22 NOTE — Progress Notes (Signed)
Subjective:     Patient ID: [name], female    DOB: [birth year], 48 y.o.   MRN: [id number]  Chief Complaint  Patient presents with  . Follow-up    HPI: The patient is a 48 y.o. female here for follow-up after panniculectomy and excision of right breast fat necrosis 1 month ago, incision is healing well. She underwent closure of wound s/p dehiscence of panniculectomy incision and placement of vac on 02/07/19. She is 2 weeks post-op.  [name] reports that overall she is doing well, she feels as if she needs to have her sutures removed because in the past she has had wound dehiscence due to the sutures being left in place for too long.  She reports on Sunday that she had developed some blistering at the periwound VAC area and removed the wound VAC for a brief period of time to allow some relief.  Her mother assisted her with placing a new wound VAC in place.  Today the [facility] is in place with a good seal and approximately 200 cc of serosanguineous drainage in the canister.  Overall, her incision is doing really well.  She has 2 areas of wound opening along the left lateral and right lateral side. There is no purulence.  There is no sign of infection.  She denies any fever, chills, nausea, vomiting.  She has been sleeping in her recliner which has been helpful for her.  She has been drinking protein shakes, eating healthy meals that were sent to her by her insurance.  She reports that she feels as if she has a yeast infection, she reports this is not uncommon for her after antibiotics.  She responds really well to Diflucan.  Review of Systems  Constitutional: Positive for activity change. Negative for appetite change, chills, diaphoresis, fatigue and fever.  Respiratory: Negative.   Cardiovascular: Negative.   Gastrointestinal: Positive for abdominal pain (Mild). Negative for abdominal distention, diarrhea, nausea and vomiting.  Genitourinary: Negative.   Musculoskeletal: Positive  for myalgias.  Skin: Positive for color change, rash and wound.  Neurological: Negative.     Objective:   Vital Signs BP 95/65 (BP Location: Left Arm, Patient Position: Sitting, Cuff Size: Large)   Pulse 90   Temp 97.8 F (36.6 C) (Temporal)   SpO2 96%  Vital Signs and Nursing Note Reviewed Chaperone present Physical Exam  Constitutional: She is oriented to person, place, and time and well-developed, well-nourished, and in no distress.  HENT:  Head: Normocephalic and atraumatic.  Cardiovascular: Normal rate.  Pulmonary/Chest: Effort normal.    Abdominal: Soft. She exhibits no distension. There is abdominal tenderness.    Transverse abdominal incision status post panniculectomy, overall healing well with some areas of slight opening.  No purulence noted.  No sign of infection.  There is some periwound irritation from the clear tape.  Previous JP drain sites noted.  Musculoskeletal: Normal range of motion.        General: No tenderness.  Neurological: She is alert and oriented to person, place, and time. Gait normal.  Skin: Skin is warm and dry. Rash noted. She is not diaphoretic. There is erythema. No pallor.  Psychiatric: Mood and affect normal.    Assessment/Plan:     ICD-10-CM   1. Postoperative wound dehiscence, subsequent encounter  T81.31XD   2. Panniculitis  M79.3    Overall [name] is doing really well.  She insisted on her sutures be removed from the right and left lateral aspects of  her incisions, but she was informed that these should remain in place for at least another week minimum to allow her incision to stay intact.  She has a history of poor wound healing as well as dehiscence of this panniculectomy incision previously.  Wound VAC removed today in the office, new wound VAC placed with Adaptic underneath the sponge to provide relief to surrounding incisional skin.  [name] and her mother will change the wound VAC on Friday and then remove it on  Monday and she can shower on Monday and come into the clinic on Tuesday with supplies and we will place a new wound VAC pending her healing status.  Continue to drink plenty of fluids, rest, eat a healthy diet, protein shakes, multivitamin and vitamin C.  Rx for Diflucan sent to pharmacy, 150 mg one-time dose.  Kermit Balo Sanjit Mcmichael, PA-C 02/22/2019, 2:03 PM

## 2019-02-23 ENCOUNTER — Telehealth: Payer: Self-pay | Admitting: *Deleted

## 2019-02-23 NOTE — Telephone Encounter (Signed)
Received on (02/21/19) Letter of Medical Necessity for Excessive Supplies via fax from [facility].  To be completed and signed.   Form completed and signed by [facility], and faxed to [facility] on (02/22/19).  Confirmation received.//AB/CMA

## 2019-02-24 ENCOUNTER — Telehealth: Payer: Self-pay | Admitting: Plastic Surgery

## 2019-02-24 MED ORDER — DOXYCYCLINE HYCLATE 100 MG PO TABS: 100.0000 mg | ORAL_TABLET | Freq: Two times a day (BID) | ORAL | 0 refills | Status: AC

## 2019-02-24 NOTE — Telephone Encounter (Signed)
Pt called to say that her wound vac supplies will not arrive until tomorrow, but her mom removed the dressing and there was a foul odor and the site on the left side opened further than when she was in the office. She had completed her antibiotics at her last visit. Please call pt to let her know whether she should come in or do something else. Thanks!

## 2019-02-24 NOTE — Telephone Encounter (Signed)
Called and informed the patient of the message below.  Patient verbalized understanding and agreed.//AB/CMA 

## 2019-02-24 NOTE — Telephone Encounter (Signed)
Per [facility] should place sterile gauze over the wound and then put an ABD pad in place.I will resend in antibiotics for 1 more week.  They should place the wound vac tomorrow when they get the supplies

## 2019-03-01 ENCOUNTER — Ambulatory Visit (INDEPENDENT_AMBULATORY_CARE_PROVIDER_SITE_OTHER): Payer: Medicare HMO | Admitting: Surgical

## 2019-03-01 ENCOUNTER — Other Ambulatory Visit: Payer: Self-pay

## 2019-03-01 ENCOUNTER — Encounter: Payer: Self-pay | Admitting: Surgical

## 2019-03-01 VITALS — BP 100/66 | HR 60 | Wt 345.8 lb

## 2019-03-01 DIAGNOSIS — T8131XD Disruption of external operation (surgical) wound, not elsewhere classified, subsequent encounter: Secondary | ICD-10-CM

## 2019-03-01 DIAGNOSIS — M793 Panniculitis, unspecified: Secondary | ICD-10-CM

## 2019-03-01 DIAGNOSIS — N641 Fat necrosis of breast: Secondary | ICD-10-CM

## 2019-03-01 NOTE — Progress Notes (Signed)
Subjective:     Patient ID: [name], female    DOB: 01-03-1971, 48 y.o.   MRN: [id number]  Chief Complaint  Patient presents with  . Follow-up    HPI: The patient is a 48 y.o. female here for follow-up after panniculectomy and excision of right breast fat necrosis 1 month ago. She had dehiscence of panniculectomy incision and underwent additional closure with placement of Vac.   At last visit, patient was overall doing well, with some areas that were open. Family had assisted with vac change prior to last visit.    Today she reports she is doing okay. She reports they changed the vac yesertday due to noticing a foul odor. Reported some drainage, which per their description sounds serosanguineous in nature.  She reports that she has been sleeping on her back more and less in a reclined position.  She also reports that she has been drinking 2 protein shakes per day.  She has also been taking a multivitamin daily.  Her mother has been assisting her with wound VAC placement, they are doing a really good job.  She has not had any fevers, chills, vomiting.  She has had a few episodes of nausea. Wound VAC is in place today with no surrounding erythema suspicious of infection, there is some surrounding tissue irritation from the cleaner VAC tape.  Drainage in the wound VAC canister is serosanguineous in nature.  They deny noticing any purulent discharge.  Review of Systems  Constitutional: Positive for activity change. Negative for appetite change, chills, diaphoresis, fatigue and fever.  Respiratory: Negative for cough, chest tightness, shortness of breath and wheezing.   Cardiovascular: Negative for chest pain and leg swelling.  Gastrointestinal: Positive for nausea. Negative for abdominal pain, constipation, diarrhea and vomiting.  Genitourinary: Negative.   Musculoskeletal: Negative.   Skin: Positive for color change, rash and wound.  Neurological: Positive for weakness.  Negative for dizziness.     Objective:   Vital Signs BP 100/66 (BP Location: Right Arm, Patient Position: Sitting, Cuff Size: Large)   Pulse 60   Wt (!) 345 lb 12.8 oz (156.9 kg)   SpO2 100%   BMI 48.23 kg/m  Vital Signs and Nursing Note Reviewed Chaperone present Physical Exam  Constitutional: She is oriented to person, place, and time and well-developed, well-nourished, and in no distress.  HENT:  Head: Normocephalic and atraumatic.  Cardiovascular: Normal rate.  Pulmonary/Chest: Effort normal.  Abdominal: Soft. She exhibits no distension and no mass. There is abdominal tenderness. There is no rebound and no guarding.    Transverse panniculectomy incision black sponge wound VAC in place.  Well sealed.  Peri-VAC area with irritated skin from clear tape.  Serosanguineous drainage in canister.  No foul odor noted.    Musculoskeletal: Normal range of motion.        General: No tenderness or deformity.  Neurological: She is alert and oriented to person, place, and time. Gait normal.  Skin: Skin is warm and dry. Rash (irritation - peri-wound) noted. She is not diaphoretic. There is erythema.  Psychiatric: Mood and affect normal.    Assessment/Plan:     ICD-10-CM   1. Postoperative wound dehiscence, subsequent encounter  T81.31XD   2. Panniculitis  M79.3   3. Breast, fat necrosis  N64.1     Wound VAC was changed yesterday by patient and her mother.  They did a very good job. the periwound area is irritated from the clear tape.  Evaluated photos of  patient's lateral wounds, they do not appear infected, no purulence noted.  Patient is overall feeling well with some weakness, she has been drinking protein shakes, resting.  She reports that she has been sleeping on her bed now recommend sleeping in recliner to decrease pressure on transverse incision.  Overall Mrs. Galluzzo is doing well, majority of her incision is C/D/I.  Will have patient remove wound VAC prior to next visit  will place wound VAC in office.  Continue with multivitamin and vitamin C continue with protein shakes daily.  Call with questions or concerns prior to next visit  Leslee Home, PA-C 03/01/2019, 3:53 PM

## 2019-03-07 ENCOUNTER — Encounter: Payer: Self-pay | Admitting: Surgical

## 2019-03-07 ENCOUNTER — Other Ambulatory Visit: Payer: Self-pay

## 2019-03-07 ENCOUNTER — Ambulatory Visit (INDEPENDENT_AMBULATORY_CARE_PROVIDER_SITE_OTHER): Payer: Medicare HMO | Admitting: Surgical

## 2019-03-07 VITALS — BP 103/70 | HR 108 | Temp 96.6°F | Ht 71.0 in | Wt 330.0 lb

## 2019-03-07 DIAGNOSIS — T8131XD Disruption of external operation (surgical) wound, not elsewhere classified, subsequent encounter: Secondary | ICD-10-CM

## 2019-03-07 DIAGNOSIS — M25551 Pain in right hip: Secondary | ICD-10-CM

## 2019-03-07 DIAGNOSIS — N641 Fat necrosis of breast: Secondary | ICD-10-CM

## 2019-03-07 DIAGNOSIS — M793 Panniculitis, unspecified: Secondary | ICD-10-CM

## 2019-03-07 NOTE — Progress Notes (Signed)
Subjective:     Patient ID: [name], female    DOB: [birth year], 48 y.o.   MRN: [id number]  Chief Complaint  Patient presents with  . Follow-up    postoperative wound   HPI: The patient is a 48 y.o. female here for follow-up after panniculectomy and excision of right breast fat necrosis 1 month ago.  She underwent additional closure of the panniculectomy incision with placement of VAC after dehiscence.  She underwent additional procedure on 02/07/2019.  She has been doing well, receiving assistance from her mother with wound VAC changes. They are doing a very good job.  [name] presented to the office today, they removed wound VAC yesterday and she had showered.  She has not had any fevers, chills, nausea, vomiting.  She reports overall feeling well.    She does endorse right lower extremity pain/aching along the anterior lateral aspect of her right hip.  She reports that it has not improved in the last week.  She has not noticed any swelling or redness.  She denies any shortness of breath, chest pain.  She does not recall any injuries to this area.  She is tender to palpation along the anterior and lateral aspect of her hip, there is a slight erythematous tint along the right lateral hip, does not appear to be infection or swelling.  There is no lower extremity swelling. She reports being active and not sitting around too frequently, but has noticed pain and some limping.  On exam she has bilateral transverse incisional wounds, granulation tissue noted, serosanguineous drainage noted.  There is no sign of purulence.  They have been applying a diaper to the right wound (8 cm x 4 cm) and gauze with ABD to the left wound (5 cm x 3 cm).  They both have good granulation tissue.   She reports that she has continued to eat healthy, drink 2 protein shakes per day.  She endorses noticing some weight loss.  Today she is 330 pounds, previous readings over the last few months have been from 345  to 360 pounds.  Review of Systems  Constitutional: Positive for activity change. Negative for appetite change, chills, diaphoresis, fatigue and fever.  HENT: Negative.   Respiratory: Negative.   Cardiovascular: Negative for leg swelling.  Gastrointestinal: Positive for abdominal pain (tenderness). Negative for constipation, nausea and vomiting.  Genitourinary: Negative.   Musculoskeletal: Positive for arthralgias and myalgias. Negative for back pain, neck pain and neck stiffness.  Skin: Positive for color change, rash and wound. Negative for pallor.  Neurological: Positive for weakness. Negative for dizziness, light-headedness, numbness and headaches.    Objective:   Vital Signs BP 103/70 (BP Location: Left Arm, Patient Position: Sitting, Cuff Size: Large)   Pulse (!) 108   Temp (!) 96.6 F (35.9 C) (Temporal)   Ht 5\' 11"  (1.803 m)   Wt (!) 330 lb (149.7 kg)   SpO2 92%   BMI 46.03 kg/m  Vital Signs and Nursing Note Reviewed Chaperone present Physical Exam  Constitutional: She is oriented to person, place, and time and well-developed, well-nourished, and in no distress.  HENT:  Head: Normocephalic and atraumatic.  Cardiovascular: Normal rate and regular rhythm.  Pulmonary/Chest: Effort normal.  Abdominal: Soft. Bowel sounds are normal. She exhibits no distension and no mass. There is abdominal tenderness. There is no rebound and no guarding.    Musculoskeletal:        General: Tenderness present. No edema.     Right  hip: She exhibits tenderness. She exhibits no swelling.       Legs:     Comments: Right anterior hip tenderness, no overt swelling, no erythema noted. TTP along anterior and lateral aspect.  RLE tenderness to palpation along posterior leg. No erythema or swelling noted. DP pulse 2+.     Neurological: She is alert and oriented to person, place, and time. Gait normal.  Skin: Skin is warm. No rash noted. She is not diaphoretic. There is erythema. No pallor.   Psychiatric: Mood and affect normal.      Assessment/Plan:     ICD-10-CM   1. Postoperative wound dehiscence, subsequent encounter  T81.31XD   2. Panniculitis  M79.3   3. Breast, fat necrosis  N64.1    Overall, she is doing well. Incision is healing well other than two wounds along the lateral aspects. No fever, chills, n/v. She has been eating high protein diet.   Donated Acell and adaptic applied to left transverse incisional wound. Apply KY jelly daily, cover with gauze, ABD. Tape in place with medipore. This will assist with granulation tissue.   Right transverse wound with more drainage than L side, wound vac applied. Recommend changing vac Thursday/friday and applying new vac.  RLE [place] for evaluation of DVT. DVT vs. MSK/joint inflammation. She does not recall any injuries. She has been more sedentary than normal, with 2 recent surgical procedures. Order sent for eval at [facility] [place]. Recommended patient to follow up with them for eval and [place], she agreed. Will check in tomorrow.   Kermit Balo Callen Vancuren, PA-C 03/07/2019, 2:39 PM

## 2019-03-11 ENCOUNTER — Encounter: Payer: Self-pay | Admitting: Surgical

## 2019-03-11 ENCOUNTER — Ambulatory Visit (HOSPITAL_BASED_OUTPATIENT_CLINIC_OR_DEPARTMENT_OTHER): Admission: RE | Admit: 2019-03-11 | Payer: Medicare HMO | Source: Ambulatory Visit

## 2019-03-15 ENCOUNTER — Encounter: Payer: Self-pay | Admitting: Nurse Practitioner

## 2019-03-15 ENCOUNTER — Ambulatory Visit (INDEPENDENT_AMBULATORY_CARE_PROVIDER_SITE_OTHER): Payer: Medicare HMO | Admitting: Nurse Practitioner

## 2019-03-15 ENCOUNTER — Other Ambulatory Visit: Payer: Self-pay

## 2019-03-15 VITALS — BP 104/72 | HR 118 | Temp 98.4°F | Ht 71.0 in | Wt 339.2 lb

## 2019-03-15 DIAGNOSIS — Z9889 Other specified postprocedural states: Secondary | ICD-10-CM

## 2019-03-15 DIAGNOSIS — T8131XD Disruption of external operation (surgical) wound, not elsewhere classified, subsequent encounter: Secondary | ICD-10-CM

## 2019-03-15 MED ORDER — COLLAGENASE 250 UNIT/GM EX OINT: 1.0000 "application " | TOPICAL_OINTMENT | CUTANEOUS | 0 refills | Status: DC

## 2019-03-15 NOTE — Progress Notes (Signed)
Patient ID: [name], female    DOB: [birth year], 48 y.o.   MRN: [id number]   Chief Complaint  Patient presents with  . Follow-up    1 week for panniculectomy   [name] is a 48 yo female who underwent panniculectomy and excision of right breast fat necrosis by Dr.Dillingham on 01/24/19.[name] performed a laparoscopic assisted ventral hernia repair at the same time. Patient had dehiscence of the abdominal wound and underwent debridement of the wound, placement of Acell, and placement of wound vac on 02/07/19. Patient's mother has been assisting with wound vac changes. Patient last seen on 11/30, at which time she was having RLE pain/aching along the anterior aspect of her hip. A left lower extremity venous ultrasound was ordered and scheduled for 12/4. Patient rescheduled the ultrasound for tomorrow. Patient states the hip soreness is better today.   Patient states she is "not feeling very well today." Patient states she began having sharp pain around her right incisional wound 2 days ago. Patient currently rates the pain as 8/10. She has been taking Tylenol and ibuprofen with some pain relief. Patient reports last taking Tylenol around 0730 this morning. Patient states she removed the wound vac 2 days ago "to get some relief from the pain." Patient states she felt "a little better" after removing the vac. The wounds are draining "a lot." Patient states her highest temperature was 99.5 last night. She has had chills. She has been nauseous and vomited last night. Denies diarrhea. She is having regular bowel movements. Denies cough, shortness of breath, chest pain, loss of taste or smell. She has not eaten today. She is drinking water. Patient states she has been urinating normally.   Review of Systems  Constitutional: Positive for activity change and chills. Negative for fever.  HENT: Negative.   Respiratory: Negative.   Cardiovascular: Negative.   Gastrointestinal: Positive  for nausea and vomiting. Negative for constipation and diarrhea.  Genitourinary: Negative.   Musculoskeletal:       Right hip soreness  Skin: Positive for wound.  Neurological: Negative.     Past Medical History:  Diagnosis Date  . Anxiety   . Arthritis    joints  . Asthma   . Endometriosis   . Fat necrosis of breast    right  . GERD (gastroesophageal reflux disease)   . History of hiatal hernia    s/p repair few times  . History of suicide attempt 03/2017  . Hypertension   . MDD (major depressive disorder)   . Panniculitis   . Pneumonia 10/2018   in hospital with pneumonia  . PONV (postoperative nausea and vomiting)   . Psoriasis    treated with Humeria    Past Surgical History:  Procedure Laterality Date  . ACHILLES TENDON REPAIR Left 01-11-2015  @[facility]   . BREAST CYST EXCISION Right 01/24/2019   Procedure: EXCISIN OF RIGHT BREAST FAT NECROSIS;  Surgeon: 01/26/2019, DO;  Location: [facility];  Service: Plastics;  Laterality: Right;  . BREAST REDUCTION SURGERY Bilateral 01/09/2016   Procedure: MAMMARY REDUCTION  (BREAST) BILATERAL;  Surgeon: 03/10/2016, DO;  Location: [facility];  Service: Plastics;  Laterality: Bilateral;  . CARPOMETACARPAL (CMC) FUSION OF THUMB Right 05-04-2015   @[facility]   . COMBINED HYSTEROSCOPY DIAGNOSTIC / D&C  06-23-2003     dr @[facility]   . D & C HYSTERSCOPY /  DX LAPAROSCOPY CONVERSION LAPAROTOMY LYSIS ADHESIONS WITH RIGHT SALPINECTOMY  12-26-2003  dr Edward Jolly    . DEBRIDEMENT AND CLOSURE WOUND N/A 02/07/2019   Procedure: DEBRIDEMENT OF ABDOMINAL WOUND, PLACEMENT OF A-CELL, PLACEMENT OF WOUND VAC;  Surgeon: Peggye Form, DO;  Location: [facility];  Service: Plastics;  Laterality: N/A;  . LAPAROSCOPIC CHOLECYSTECTOMY  10-19-2002   dr Orson Slick    . LAPAROSCOPIC GASTRIC BANDING  09-25-2003     WITH HIATAL HERNIA REPAIR  . LAPAROSCOPIC LYSIS OF ADHESIONS N/A 01/24/2019   Procedure: LAPAROSCOPY WITH LYSIS OF  ADHESIONS;  Surgeon: Luretha Murphy, MD;  Location: [facility];  Service: General;  Laterality: N/A;  . LAPAROSCOPIC REPAIR AND REMOVAL OF GASTRIC BAND  02-06-2006    . LAPAROTOMY LYSIS ADHESIONS , UTERINE BIOPSY  01-31-2000   dr lowe    . OPEN WOUND DEBRIDEMENT ANTERIOR ABDOMINAL WALL , REMOVAL Oswald Hillock BODY'S  05-10-2001   dr Orson Slick    . PANNICULECTOMY N/A 01/24/2019   Procedure: PANNICULECTOMY;  Surgeon: Peggye Form, DO;  Location: [facility];  Service: Plastics;  Laterality: N/A;  . REPAIR RECURRENT  HIATAL HERNIA   04-23-2004    . TAKEDOWN RECURRENT HIATAL HERNIA FROM PREVIOUS MESH REPAIR OF DIAPHRAGM/ REPAIR HIATAL HERNIA AND THE GASTROPEXY  11/05/2004  . TONSILLECTOMY  age 55  . TOTAL ABDOMINAL HYSTERECTOMY W/ BILATERAL SALPINGOOPHORECTOMY  12-23-2005   dr soper   W/  BILATERAL URETEROLYSIS AND EXTENSIVE ABD. LYSIS ADHESIONS  . UMBILICAL HERNIA REPAIR  12/27/2009   LAPAROSCOPY TAKEDOWN INCARCERATED UMBILICAL HERNIA WITH REPAIR/ OPEN REPAIR COMPLEX LOWER INCISIONAL HERNIA      Current Outpatient Medications:  .  acetaminophen (TYLENOL) 500 MG tablet, Take 1,000 mg by mouth every 4 (four) hours as needed for fever or headache., Disp: , Rfl:  .  albuterol (PROVENTIL HFA;VENTOLIN HFA) 108 (90 BASE) MCG/ACT inhaler, Inhale 2 puffs into the lungs every 6 (six) hours as needed. For shortness of breath, Disp: , Rfl:  .  ALPRAZolam (XANAX) 0.5 MG tablet, Take 0.5 mg by mouth 3 (three) times daily as needed for anxiety. , Disp: , Rfl:  .  Amino Acids-Protein Hydrolys (FEEDING SUPPLEMENT, PRO-STAT SUGAR FREE 64,) LIQD, Take 30 mLs by mouth 2 (two) times daily., Disp: 887 mL, Rfl: 0 .  azelastine (ASTELIN) 137 MCG/SPRAY nasal spray, Place 2 sprays into both nostrils 2 (two) times daily as needed for rhinitis. Use in each nostril as directed, Disp: , Rfl:  .  b complex vitamins tablet, Take 1 tablet by mouth daily., Disp: , Rfl:  .  Biotin 16109 MCG TABS, Take 10,000 mcg by mouth daily.,  Disp: , Rfl:  .  buPROPion (WELLBUTRIN XL) 300 MG 24 hr tablet, Take 300 mg by mouth daily., Disp: , Rfl:  .  cetirizine (ZYRTEC) 10 MG tablet, Take 10 mg by mouth daily as needed for allergies. , Disp: , Rfl:  .  Ensure Max Protein (ENSURE MAX PROTEIN) LIQD, Take 330 mLs (11 oz total) by mouth 2 (two) times daily., Disp:  , Rfl:  .  gabapentin (NEURONTIN) 300 MG capsule, Take 300 mg by mouth 3 (three) times daily., Disp: , Rfl:  .  ipratropium-albuterol (DUONEB) 0.5-2.5 (3) MG/3ML SOLN, Take 3 mLs by nebulization every 6 (six) hours as needed. (Patient taking differently: Take 3 mLs by nebulization every 6 (six) hours as needed (asthma). ), Disp: 360 mL, Rfl: 0 .  mirtazapine (REMERON) 15 MG tablet, Take 1 tablet (15 mg total) by mouth at bedtime. For mood control, Disp: 30 tablet, Rfl: 0 .  montelukast (SINGULAIR) 10  MG tablet, Take 10 mg by mouth at bedtime. , Disp: , Rfl:  .  Multiple Vitamin (MULTIVITAMIN) tablet, Take 1 tablet by mouth daily., Disp: , Rfl:  .  nutrition supplement, JUVEN, (JUVEN) PACK, Take 1 packet by mouth 2 (two) times daily between meals., Disp: , Rfl: 0 .  olmesartan (BENICAR) 40 MG tablet, Take 40 mg by mouth every morning. , Disp: , Rfl:  .  pantoprazole (PROTONIX) 40 MG tablet, Take 40 mg by mouth 2 (two) times daily. , Disp: , Rfl:  .  polyethylene glycol (MIRALAX / GLYCOLAX) 17 g packet, Take 17 g by mouth daily as needed for mild constipation., Disp: 14 each, Rfl: 0 .  promethazine (PHENERGAN) 25 MG tablet, Take 25 mg by mouth every 8 (eight) hours as needed for nausea or vomiting. , Disp: , Rfl:  .  RESTASIS 0.05 % ophthalmic emulsion, Place 1 drop into both eyes 2 (two) times daily., Disp: , Rfl:  .  senna (SENOKOT) 8.6 MG tablet, Take 1 tablet by mouth daily as needed for constipation., Disp: , Rfl:  .  traZODone (DESYREL) 50 MG tablet, Take 1 tablet (50 mg total) by mouth at bedtime as needed for sleep. For sleep (Patient taking differently: Take 50 mg by mouth  at bedtime. For sleep), Disp: 30 tablet, Rfl: 0 .  venlafaxine XR (EFFEXOR-XR) 150 MG 24 hr capsule, Take 150 mg by mouth daily with breakfast. , Disp: , Rfl:  .  vitamin A 8000 UNIT capsule, Take 8,000 Units by mouth daily., Disp: , Rfl:  .  vitamin B-12 (CYANOCOBALAMIN) 1000 MCG tablet, Take 1,000 mcg by mouth daily., Disp: , Rfl:  .  vitamin C (ASCORBIC ACID) 500 MG tablet, Take 500 mg by mouth daily., Disp: , Rfl:    Objective:   Vitals:   03/15/19 1325 03/15/19 1430  BP: 104/72   Pulse: (!) 118   Temp: (!) 97.3 F (36.3 C) 98.4 F (36.9 C)  SpO2: 96%     Physical Exam  General: alert, slightly pale, no acute distress HEENT:normocephalic Neck: full ROM Chest: symmetrical rise and fall Lungs: unlabored breathing Cardiac: +2 bilateral radial pulse Abdomen: 8.2 cm x 2 cm x 1.5 cm open wound along right endpoint of transverse incision with granulation tissue in wound bed, large amount serous drainage, no purulence, no erythema, no foul odor, mild tenderness on palpation; ~0.5 cm x 0.3 cm wound at suprapubic transverse incision site; 8.5 cm x 2 cm x 1.5 cm open wound along left endpoint of transverse incision with granulation tissue in wound bed, moderate amount serous drainage, no purulence, no erythema, no foul odor, mild tenderness on palpation Musculoskeletal: MAEx4, no lower extremity edema Neuro: A&O x3, calm, cooperative, steady gait Skin: warm, dry   Assessment & Plan:  Postoperative wound dehiscence, subsequent encounter  Status post panniculectomy  [name] is a 48 yo female s/p panniculectomy on 01/24/19. Her post-operative recovery has been complicated by dehiscence of the transverse abdominal wound, requiring debridement, Acell, and wound vac placement. She has fairly large open wounds at her previous JP drain sites. The wounds do not look infected. There is some granulation tissue noted within the wound beds. The wounds were cleansed, debrided, and packed with  wet to dry dressings. Will order Santyl for every other day applications. Patient's mother will perform daily wet to dry dressing changes until receiving Santyl. If unable to obtain Santyl, will continue wet to dry dressings and plan for possible wound vac replacement  in 2 weeks. Unsure why patient is having increased pain. She had minimal site tenderness on exam. She is scheduled for right lower extremity ultrasound tomorrow. Patient does not appear toxic. Patient encouraged to rest, continue high protein diet, and multivitamin. DME supplies ordered. Return in 2 weeks.     Alfredo Batty, NP

## 2019-03-16 ENCOUNTER — Telehealth: Payer: Self-pay | Admitting: *Deleted

## 2019-03-16 ENCOUNTER — Ambulatory Visit (HOSPITAL_BASED_OUTPATIENT_CLINIC_OR_DEPARTMENT_OTHER): Admission: RE | Admit: 2019-03-16 | Payer: Medicare HMO | Source: Ambulatory Visit

## 2019-03-16 NOTE — Telephone Encounter (Signed)
Received order status notification from [facility] stating that order has been shipped to the patient today.//AB/CMA    Faxed order on (03/15/19) for medical supplies to [facility].  Confirmation received.//AB/CMA

## 2019-03-29 ENCOUNTER — Ambulatory Visit (INDEPENDENT_AMBULATORY_CARE_PROVIDER_SITE_OTHER): Payer: Medicare HMO | Admitting: Nurse Practitioner

## 2019-03-29 ENCOUNTER — Encounter: Payer: Self-pay | Admitting: Nurse Practitioner

## 2019-03-29 ENCOUNTER — Other Ambulatory Visit: Payer: Self-pay

## 2019-03-29 VITALS — BP 150/78 | HR 71 | Temp 98.7°F | Ht 71.0 in | Wt 334.0 lb

## 2019-03-29 DIAGNOSIS — T8131XD Disruption of external operation (surgical) wound, not elsewhere classified, subsequent encounter: Secondary | ICD-10-CM

## 2019-03-29 DIAGNOSIS — Z9889 Other specified postprocedural states: Secondary | ICD-10-CM

## 2019-03-29 NOTE — Progress Notes (Signed)
Patient ID: [name], female    DOB: Feb 07, 1971, 48 y.o.   MRN: [id number]  C.C.: wound follow up  [name] is a 48 yo female who underwentpanniculectomyandexcision of right breast fat necrosis by Dr.Dillinghamon 01/24/19.Dr. Daphine DeutscherMartin performed alaparoscopic assisted ventral hernia repairat the same time. Patient had dehiscence of the abdominal wound and underwent debridement of the wound, placement of Acell, and placement of wound vac on 02/07/19. Patient recently diagnosed with bronchitis and treated with azithromycin. Patient states she is feeling better today. Patient's mother has been doing every other day dressing changes of santyl and gauze. There is still "a lot" of drainage from the right lower wound. Patient states she put the wound vac on for one day "because of the drainage." Patient states the vac dressing "tore up" her skin. Denies any fevers. Patient is still numb in several areas, but it beginning to an occasional "sharp or stinging" sensation. Patient asking when she can drive.   Review of Systems  Constitutional: Positive for activity change. Negative for fatigue and fever.  HENT: Negative.   Respiratory: Negative.   Gastrointestinal: Negative.   Genitourinary: Negative.   Skin: Positive for wound.  Neurological: Negative.     Past Medical History:  Diagnosis Date  . Anxiety   . Arthritis    joints  . Asthma   . Endometriosis   . Fat necrosis of breast    right  . GERD (gastroesophageal reflux disease)   . History of hiatal hernia    s/p repair few times  . History of suicide attempt 03/2017  . Hypertension   . MDD (major depressive disorder)   . Panniculitis   . Pneumonia 10/2018   in hospital with pneumonia  . PONV (postoperative nausea and vomiting)   . Psoriasis    treated with Humeria    Past Surgical History:  Procedure Laterality Date  . ACHILLES TENDON REPAIR Left 01-11-2015  @[facility]   . BREAST CYST EXCISION Right 01/24/2019   Procedure: EXCISIN OF RIGHT BREAST FAT NECROSIS;  Surgeon: Peggye Formillingham, Claire S, DO;  Location: [facility];  Service: Plastics;  Laterality: Right;  . BREAST REDUCTION SURGERY Bilateral 01/09/2016   Procedure: MAMMARY REDUCTION  (BREAST) BILATERAL;  Surgeon: Peggye Formlaire S Dillingham, DO;  Location: [facility];  Service: Plastics;  Laterality: Bilateral;  . CARPOMETACARPAL (CMC) FUSION OF THUMB Right 05-04-2015   @[facility]   . COMBINED HYSTEROSCOPY DIAGNOSTIC / D&C  06-23-2003     dr Ambrose Mantlehenley @[facility]   . D & C HYSTERSCOPY /  DX LAPAROSCOPY CONVERSION LAPAROTOMY LYSIS ADHESIONS WITH RIGHT SALPINECTOMY  12-26-2003    dr Edward Jollysilva  @[facility]   . DEBRIDEMENT AND CLOSURE WOUND N/A 02/07/2019   Procedure: DEBRIDEMENT OF ABDOMINAL WOUND, PLACEMENT OF A-CELL, PLACEMENT OF WOUND VAC;  Surgeon: Peggye Formillingham, Claire S, DO;  Location: [facility];  Service: Plastics;  Laterality: N/A;  . LAPAROSCOPIC CHOLECYSTECTOMY  10-19-2002   dr Orson Slickbowman  @[facility]   . LAPAROSCOPIC GASTRIC BANDING  09-25-2003  @[facility]    WITH HIATAL HERNIA REPAIR  . LAPAROSCOPIC LYSIS OF ADHESIONS N/A 01/24/2019   Procedure: LAPAROSCOPY WITH LYSIS OF ADHESIONS;  Surgeon: Luretha MurphyMartin, Matthew, MD;  Location: [facility];  Service: General;  Laterality: N/A;  . LAPAROSCOPIC REPAIR AND REMOVAL OF GASTRIC BAND  02-06-2006  @[facility]   . LAPAROTOMY LYSIS ADHESIONS , UTERINE BIOPSY  01-31-2000   dr Rana Snarelowe  @[facility]   . OPEN WOUND DEBRIDEMENT ANTERIOR ABDOMINAL WALL , REMOVAL FORGEIN BODY'S  05-10-2001   dr Orson Slickbowman  @[facility]   .  PANNICULECTOMY N/A 01/24/2019   Procedure: PANNICULECTOMY;  Surgeon: Wallace Going, DO;  Location: [facility];  Service: Plastics;  Laterality: N/A;  . REPAIR RECURRENT  HIATAL HERNIA   04-23-2004  @[facility]   . TAKEDOWN RECURRENT HIATAL HERNIA FROM PREVIOUS MESH REPAIR OF DIAPHRAGM/ REPAIR HIATAL HERNIA AND THE GASTROPEXY  11/05/2004  . TONSILLECTOMY  age 35  . TOTAL ABDOMINAL HYSTERECTOMY W/ BILATERAL SALPINGOOPHORECTOMY  12-23-2005   dr soper   W/  BILATERAL URETEROLYSIS AND EXTENSIVE ABD.  LYSIS ADHESIONS  . UMBILICAL HERNIA REPAIR  12/27/2009   LAPAROSCOPY TAKEDOWN INCARCERATED UMBILICAL HERNIA WITH REPAIR/ OPEN REPAIR COMPLEX LOWER INCISIONAL HERNIA      Current Outpatient Medications:  .  acetaminophen (TYLENOL) 500 MG tablet, Take 1,000 mg by mouth every 4 (four) hours as needed for fever or headache., Disp: , Rfl:  .  albuterol (PROVENTIL HFA;VENTOLIN HFA) 108 (90 BASE) MCG/ACT inhaler, Inhale 2 puffs into the lungs every 6 (six) hours as needed. For shortness of breath, Disp: , Rfl:  .  ALPRAZolam (XANAX) 0.5 MG tablet, Take 0.5 mg by mouth 3 (three) times daily as needed for anxiety. , Disp: , Rfl:  .  Amino Acids-Protein Hydrolys (FEEDING SUPPLEMENT, PRO-STAT SUGAR FREE 64,) LIQD, Take 30 mLs by mouth 2 (two) times daily., Disp: 887 mL, Rfl: 0 .  azelastine (ASTELIN) 137 MCG/SPRAY nasal spray, Place 2 sprays into both nostrils 2 (two) times daily as needed for rhinitis. Use in each nostril as directed, Disp: , Rfl:  .  b complex vitamins tablet, Take 1 tablet by mouth daily., Disp: , Rfl:  .  Biotin 10000 MCG TABS, Take 10,000 mcg by mouth daily., Disp: , Rfl:  .  buPROPion (WELLBUTRIN XL) 300 MG 24 hr tablet, Take 300 mg by mouth daily., Disp: , Rfl:  .  cetirizine (ZYRTEC) 10 MG tablet, Take 10 mg by mouth daily as needed for allergies. , Disp: , Rfl:  .  collagenase (SANTYL) ointment, Apply 1 application topically every other day. Apply to right and left abdominal wounds every other day., Disp: 15 g, Rfl: 0 .  Ensure Max Protein (ENSURE MAX PROTEIN) LIQD, Take 330 mLs (11 oz total) by mouth 2 (two) times daily., Disp:  , Rfl:  .  gabapentin (NEURONTIN) 300 MG capsule, Take 300 mg by mouth 3 (three) times daily., Disp: , Rfl:  .  ipratropium-albuterol (DUONEB) 0.5-2.5 (3) MG/3ML SOLN, Take 3 mLs by nebulization every 6 (six) hours as needed. (Patient taking differently: Take 3 mLs by nebulization every 6 (six) hours as needed (asthma). ), Disp: 360 mL, Rfl: 0 .   mirtazapine (REMERON) 15 MG tablet, Take 1 tablet (15 mg total) by mouth at bedtime. For mood control, Disp: 30 tablet, Rfl: 0 .  montelukast (SINGULAIR) 10 MG tablet, Take 10 mg by mouth at bedtime. , Disp: , Rfl:  .  Multiple Vitamin (MULTIVITAMIN) tablet, Take 1 tablet by mouth daily., Disp: , Rfl:  .  nutrition supplement, JUVEN, (JUVEN) PACK, Take 1 packet by mouth 2 (two) times daily between meals., Disp: , Rfl: 0 .  olmesartan (BENICAR) 40 MG tablet, Take 40 mg by mouth every morning. , Disp: , Rfl:  .  pantoprazole (PROTONIX) 40 MG tablet, Take 40 mg by mouth 2 (two) times daily. , Disp: , Rfl:  .  polyethylene glycol (MIRALAX / GLYCOLAX) 17 g packet, Take 17 g by mouth daily as needed for mild constipation., Disp: 14 each, Rfl: 0 .  promethazine (PHENERGAN) 25 MG  tablet, Take 25 mg by mouth every 8 (eight) hours as needed for nausea or vomiting. , Disp: , Rfl:  .  RESTASIS 0.05 % ophthalmic emulsion, Place 1 drop into both eyes 2 (two) times daily., Disp: , Rfl:  .  senna (SENOKOT) 8.6 MG tablet, Take 1 tablet by mouth daily as needed for constipation., Disp: , Rfl:  .  traZODone (DESYREL) 50 MG tablet, Take 1 tablet (50 mg total) by mouth at bedtime as needed for sleep. For sleep (Patient taking differently: Take 50 mg by mouth at bedtime. For sleep), Disp: 30 tablet, Rfl: 0 .  venlafaxine XR (EFFEXOR-XR) 150 MG 24 hr capsule, Take 150 mg by mouth daily with breakfast. , Disp: , Rfl:  .  vitamin A 8000 UNIT capsule, Take 8,000 Units by mouth daily., Disp: , Rfl:  .  vitamin B-12 (CYANOCOBALAMIN) 1000 MCG tablet, Take 1,000 mcg by mouth daily., Disp: , Rfl:  .  vitamin C (ASCORBIC ACID) 500 MG tablet, Take 500 mg by mouth daily., Disp: , Rfl:    Objective:   Vitals:   03/29/19 1402  BP: (!) 150/78  Pulse: 71  Temp: 98.7 F (37.1 C)  SpO2: 96%    Physical Exam  General: alert, calm, no acute distress HEENT:normocephalic Neck: full ROM Chest: symmetrical rise and fall Lungs:  unlabored breathing Abdomen: 10 cm x 1.5 cm open wound along right endpoint of transverse incision with granulation tissue in wound bed, moderate amount serous drainage, no purulence, no erythema, no foul odor, non-tender; 7 cm x 1 cm open wound along left endpoint of transverse incision with granulation tissue in wound bed, small amount serous drainage, no purulence, no erythema, no foul odor, non-tender Musculoskeletal: MAEx4 Neuro: A&O x3, calm, cooperative, steady gait Skin: warm, dry   Assessment & Plan:  Status post panniculectomy  Postoperative wound dehiscence, subsequent encounter  Nikkie Liming is a 48 yo female s/p panniculectomy on 01/24/19. Her post-operative recovery has been complicated by dehiscence of the transverse abdominal wound, requiring debridement, Acell, and wound vac placement. The left abdominal wound has decreased in size since her last visit. The right abdominal wound is about the same. No signs of infection. The wounds were cleansed. Cellerate was applied to the wound beds and covered with gauze and ABD pads. Daily dressing changes with application of cellerate and gauze.  Return in 2-3 weeks     Iantha Fallen, NP

## 2019-04-07 ENCOUNTER — Telehealth: Payer: Self-pay

## 2019-04-07 NOTE — Telephone Encounter (Signed)
Received order for [facility] Wound Tracking. Completed and faxed back to April at [facility]

## 2019-04-11 ENCOUNTER — Telehealth: Payer: Self-pay | Admitting: Plastic Surgery

## 2019-04-11 NOTE — Telephone Encounter (Signed)
Pt called to let us know that she has enough accelerate (?) to last for one more bandage change and then she'll be out. She wasn't sure if this was going to be called in for her, but she wanted to let the clinical team know. Please call her back to confirm.

## 2019-04-12 NOTE — Telephone Encounter (Signed)
Called and spoke with the patient regarding the message below.  Informed the patient per [facility] to continue what she's doing except for the accelerate and we will see her at her next appointment (04/15/19).  Patient verbalized understanding and agreed.//AB/CMA

## 2019-04-14 ENCOUNTER — Telehealth: Payer: Self-pay

## 2019-04-14 NOTE — Telephone Encounter (Signed)

## 2019-04-15 ENCOUNTER — Ambulatory Visit: Payer: Medicare HMO | Admitting: Plastic Surgery

## 2019-04-20 ENCOUNTER — Ambulatory Visit (INDEPENDENT_AMBULATORY_CARE_PROVIDER_SITE_OTHER): Payer: Medicare Other | Admitting: Surgical

## 2019-04-20 ENCOUNTER — Other Ambulatory Visit: Payer: Self-pay

## 2019-04-20 ENCOUNTER — Encounter: Payer: Self-pay | Admitting: Surgical

## 2019-04-20 VITALS — BP 110/79 | HR 91 | Temp 98.2°F | Ht 71.0 in | Wt 334.0 lb

## 2019-04-20 DIAGNOSIS — Z9889 Other specified postprocedural states: Secondary | ICD-10-CM

## 2019-04-20 NOTE — Progress Notes (Signed)
Subjective:     Patient ID: [name], female    DOB: 04-18-1970, 49 y.o.   MRN: [id number]  Chief Complaint  Patient presents with  . Follow-up    panniculectomy follow up SX:01/24/19      HPI: The patient is a 49 y.o. female here for follow-up after panniculectomy.  She is now approximately 2 months postop after debridement of her panniculectomy wound, placement of ACell, placement of wound VAC.  She has overall been doing well.  They have been applying cellerate collagen every other day up until about 8 days ago.  They had run out.  Today on exam her wounds appear to be doing a lot better.  The left has closed but has an area of granulation tissue exposed along the inferior skin flap.  Does not appear infected.  There is no periwound erythema.  No purulence noted.  Nontender to palpation.  Right abdominal/panniculectomy wound is also doing better.  Today it is 8.5 x 1 cm.  No purulence noted.  There is some yellow exudate noted.  Nontender to palpation.  She has not had any fevers, chills, nausea, vomiting, chest pain, shortness breath.  She is overall eating healthier, reports she is eating a little bit more than she would like at night.  She is hopeful that these will heal up soon so she can begin water aquatic therapy/exercise again.    Review of Systems  Constitutional: Negative.   Respiratory: Negative.   Cardiovascular: Negative.   Genitourinary: Negative.   Skin: Positive for color change and wound.  Neurological: Negative.      Objective:   Vital Signs BP 110/79 (BP Location: Left Arm, Patient Position: Sitting, Cuff Size: Large)   Pulse 91   Temp 98.2 F (36.8 C) (Temporal)   Ht 5\' 11"  (1.803 m)   Wt (!) 334 lb (151.5 kg)   SpO2 98%   BMI 46.58 kg/m  Vital Signs and Nursing Note Reviewed Chaperone present Physical Exam  Constitutional: She is oriented to person, place, and time and well-developed, well-nourished, and in no distress.  Cardiovascular:  Normal rate.  Pulmonary/Chest: Effort normal.  Abdominal: She exhibits no distension. There is no abdominal tenderness.    Neurological: She is alert and oriented to person, place, and time. Gait normal.  Skin: Skin is warm and dry. She is not diaphoretic.  Psychiatric: Mood and affect normal.     Assessment/Plan:     ICD-10-CM   1. Status post panniculectomy  Z98.890     Overall [name] is doing well.  Her left transverse open wound has closed and she has a small wound along the inferior skin flap that appears to be healing well.  The right transverse open wound is not progressing as quickly, but has improved since her last visit.  Up until approximately 8 days ago they were applying Cellerate collagen every other day.  Recently they have just been applying gauze to collect drainage due to having no supplies left.  There is no sign of infection.  Recommend daily dressing changes and applying collagen every other day.  Keep the area clean.  Continue to eat a healthy diet.   Follow-up in 3 weeks with Dr. Marla Roe for further evaluation.  Hopeful that the left side will be completely healed at that time and the right side will have progressed more.  Pictures were obtained of the patient and placed in the chart with the patient's or guardian's permission.  Carola Rhine Vanissa Strength,  PA-C 04/20/2019, 3:00 PM

## 2019-05-13 ENCOUNTER — Encounter: Payer: Self-pay | Admitting: Plastic Surgery

## 2019-05-13 ENCOUNTER — Ambulatory Visit (INDEPENDENT_AMBULATORY_CARE_PROVIDER_SITE_OTHER): Payer: Medicare Other | Admitting: Plastic Surgery

## 2019-05-13 ENCOUNTER — Ambulatory Visit: Payer: Medicare Other | Admitting: Plastic Surgery

## 2019-05-13 ENCOUNTER — Other Ambulatory Visit: Payer: Self-pay

## 2019-05-13 VITALS — BP 118/79 | HR 103 | Temp 97.3°F | Ht 71.0 in | Wt 340.6 lb

## 2019-05-13 DIAGNOSIS — T8130XA Disruption of wound, unspecified, initial encounter: Secondary | ICD-10-CM | POA: Diagnosis not present

## 2019-05-13 NOTE — Progress Notes (Signed)
   Subjective:    Patient ID: [name], female    DOB: 1971-02-03, 49 y.o.   MRN: [id number]  The patient is a 49 year old female here with her mom for follow-up on her abdominal surgery.  She is doing much better overall.  The left wound has closed the right wound is ~3 x 6 x 2 cm in size.  There is a little bit of drainage as expected.  Nothing looks infected.  She has been using Vaseline and cellerate on the area.  She is showering without difficulty.  Her pain is well controlled.     Review of Systems  Constitutional: Negative.   HENT: Negative.   Eyes: Negative.   Respiratory: Negative.   Cardiovascular: Negative.   Gastrointestinal: Negative.   Genitourinary: Negative.   Musculoskeletal: Negative.   Skin: Positive for wound.       Objective:   Physical Exam Vitals and nursing note reviewed.  Constitutional:      Appearance: Normal appearance.  HENT:     Head: Normocephalic and atraumatic.  Cardiovascular:     Rate and Rhythm: Normal rate.     Pulses: Normal pulses.  Pulmonary:     Effort: Pulmonary effort is normal.  Abdominal:     General: Abdomen is flat. There is no distension.     Tenderness: There is no abdominal tenderness.    Neurological:     General: No focal deficit present.     Mental Status: She is alert. Mental status is at baseline.  Psychiatric:        Mood and Affect: Mood normal.        Behavior: Behavior normal.       Assessment & Plan:     ICD-10-CM   1. Wound dehiscence  T81.30XA     The patient was offered the option of excision with attempt at closure.  She is okay to continue with dressing care for now.  She also asked about cool sculpting.  I highly recommend that she not do anything like that on her abdomen until this is completely healed.  I also stated that I did not think that it would be a good idea in general for her.  I will see if I can get the celery and the collagen sent to her house.  If this is possible she should  do daily dressing changes on the right with the collagen or celebrate and Vaseline to the left.

## 2019-05-16 ENCOUNTER — Telehealth: Payer: Self-pay | Admitting: Plastic Surgery

## 2019-05-16 ENCOUNTER — Telehealth: Payer: Self-pay

## 2019-05-16 NOTE — Telephone Encounter (Signed)
Amber, with [facility], called to get clarification on a product for pt. S/w clinical staff and they advised they are waiting to clarify product type with Dr. Ulice Bold. Please call Amber once confirmed.

## 2019-05-16 NOTE — Telephone Encounter (Signed)
Called [facility] and confirmed they have the order. The collagen and gauze will be arriving tomorrow. They wanted to know if the cellerate should be ordered in the gel form or powder. Will ask Dr. Ulice Bold and verify. Called Patient and advised her she will receive 2 of the items in the order, and the cellerate will come later.

## 2019-05-16 NOTE — Telephone Encounter (Signed)
[name] called to follow up on the wound care order. She requested a call back with an update.

## 2019-05-17 ENCOUNTER — Telehealth: Payer: Self-pay | Admitting: *Deleted

## 2019-05-17 NOTE — Telephone Encounter (Signed)
Called [facility] and spoke with April, verified to order gel cellerate

## 2019-05-17 NOTE — Telephone Encounter (Signed)
Faxed order on (05/13/19) to [facility] for supplies for the patient.  Confirmation received.//AB/CMA

## 2019-06-06 ENCOUNTER — Telehealth: Payer: Self-pay

## 2019-06-06 NOTE — Telephone Encounter (Signed)
Patient called to inform us that she would like to proceed with surgery for her abominal wound. She states the wound has not improved and she continues to have drainage and pain. She will discuss this further at her appointment with Dr. Ulice Bold on Friday, 06/10/2019.

## 2019-06-10 ENCOUNTER — Other Ambulatory Visit: Payer: Self-pay

## 2019-06-10 ENCOUNTER — Ambulatory Visit (INDEPENDENT_AMBULATORY_CARE_PROVIDER_SITE_OTHER): Payer: Medicare Other | Admitting: Surgical

## 2019-06-10 ENCOUNTER — Encounter: Payer: Self-pay | Admitting: Surgical

## 2019-06-10 VITALS — BP 148/93 | HR 104 | Temp 98.0°F | Ht 71.0 in | Wt 337.8 lb

## 2019-06-10 DIAGNOSIS — T8131XD Disruption of external operation (surgical) wound, not elsewhere classified, subsequent encounter: Secondary | ICD-10-CM

## 2019-06-10 DIAGNOSIS — Z9889 Other specified postprocedural states: Secondary | ICD-10-CM

## 2019-06-10 NOTE — H&P (View-Only) (Signed)
Patient ID: [name], female    DOB: 10/11/1970, 49 y.o.   MRN: [id number]  Chief Complaint  Patient presents with  . Pre-op Exam    Patient her for H&P to discuss surgery options SX: 06/22/19 for excision of abdominal wounds with possible integra or cellerate and VAC closer     ICD-10-CM   1. Status post panniculectomy  Z98.890   2. Postoperative wound dehiscence, subsequent encounter  T81.31XD     History of Present Illness: [name] is a 49 y.o.  female who underwent panniculectomy and excision of right breast fat necrosis on 01/24/2019 with Dr. Foster Simpson.  At the same time she also underwent laparoscopic with lysis of adhesions by Dr. Luretha Murphy.  Postoperatively she had wound dehiscence of her pannus incision and underwent debridement of abdominal wound with placement of ACell and placement of wound VAC on 02/07/2019. After her debridement, the majority of her wound healed up nicely, but she did have some breakdown and developed two wounds along her lateral panniculectomy incision. She has continued to do local wound care over the past few months with improvement. Her left side has healed, but her right side is still open.   She presents for preoperative evaluation for upcoming procedure, excision of abdominal wound with possible closure, scheduled for 06/22/2018 with Dr. Ulice Bold.  Hx of PONV and reports slow to wake up with breast reduction surgery in past. Reports no issues with recent surgeries (panniculectomy, wound debridement). No hx of dvt/pe. No fmhx of dvt/pe. No hx of bleeding/clotting. No recent colds/illnesses. No recent sob, cp, palpitations. No hx of MI/CVA. Not taking any blood thinners.  She has been applying collagen and hydrogel to right abdomen wound daily with help from her mother. It is improving in size and depth, but the wound edge superiorly is not in contact with another wound edge. It is overlaying the epithelium along the  inferior abdominal fold. No erythema. No foul odor.  [name] reports she has noticed an odor over the past few weeks or so. She also reports she had some erythema around the wound/fold recently, but applied some cream that her mother was prescribed and this seemed to help. She is unsure the name of the cream, but it starts with a "c"  Past Medical History: Allergies: Allergies  Allergen Reactions  . Influenza Vaccines     States her "arm had a knot and turned red"  . Lisinopril-Hydrochlorothiazide     welts    Current Medications:  Current Outpatient Medications:  .  acetaminophen (TYLENOL) 500 MG tablet, Take 1,000 mg by mouth every 4 (four) hours as needed for fever or headache., Disp: , Rfl:  .  albuterol (PROVENTIL HFA;VENTOLIN HFA) 108 (90 BASE) MCG/ACT inhaler, Inhale 2 puffs into the lungs every 6 (six) hours as needed. For shortness of breath, Disp: , Rfl:  .  ALPRAZolam (XANAX) 0.5 MG tablet, Take 0.5 mg by mouth 3 (three) times daily as needed for anxiety. , Disp: , Rfl:  .  azelastine (ASTELIN) 137 MCG/SPRAY nasal spray, Place 2 sprays into both nostrils 2 (two) times daily as needed for rhinitis. Use in each nostril as directed, Disp: , Rfl:  .  b complex vitamins tablet, Take 1 tablet by mouth daily., Disp: , Rfl:  .  Biotin 12751 MCG TABS, Take 10,000 mcg by mouth daily., Disp: , Rfl:  .  buPROPion (WELLBUTRIN XL) 300 MG 24 hr tablet, Take 300 mg by  mouth daily., Disp: , Rfl:  .  cetirizine (ZYRTEC) 10 MG tablet, Take 10 mg by mouth daily as needed for allergies. , Disp: , Rfl:  .  collagenase (SANTYL) ointment, Apply 1 application topically every other day. Apply to right and left abdominal wounds every other day., Disp: 15 g, Rfl: 0 .  Ensure Max Protein (ENSURE MAX PROTEIN) LIQD, Take 330 mLs (11 oz total) by mouth 2 (two) times daily., Disp:  , Rfl:  .  gabapentin (NEURONTIN) 300 MG capsule, Take 300 mg by mouth 3 (three) times daily., Disp: , Rfl:  .   ipratropium-albuterol (DUONEB) 0.5-2.5 (3) MG/3ML SOLN, Take 3 mLs by nebulization every 6 (six) hours as needed. (Patient taking differently: Take 3 mLs by nebulization every 6 (six) hours as needed (asthma). ), Disp: 360 mL, Rfl: 0 .  mirtazapine (REMERON) 15 MG tablet, Take 1 tablet (15 mg total) by mouth at bedtime. For mood control, Disp: 30 tablet, Rfl: 0 .  montelukast (SINGULAIR) 10 MG tablet, Take 10 mg by mouth at bedtime. , Disp: , Rfl:  .  Multiple Vitamin (MULTIVITAMIN) tablet, Take 1 tablet by mouth daily., Disp: , Rfl:  .  nutrition supplement, JUVEN, (JUVEN) PACK, Take 1 packet by mouth 2 (two) times daily between meals., Disp: , Rfl: 0 .  olmesartan (BENICAR) 40 MG tablet, Take 40 mg by mouth every morning. , Disp: , Rfl:  .  pantoprazole (PROTONIX) 40 MG tablet, Take 40 mg by mouth 2 (two) times daily. , Disp: , Rfl:  .  polyethylene glycol (MIRALAX / GLYCOLAX) 17 g packet, Take 17 g by mouth daily as needed for mild constipation., Disp: 14 each, Rfl: 0 .  promethazine (PHENERGAN) 25 MG tablet, Take 25 mg by mouth every 8 (eight) hours as needed for nausea or vomiting. , Disp: , Rfl:  .  RESTASIS 0.05 % ophthalmic emulsion, Place 1 drop into both eyes 2 (two) times daily., Disp: , Rfl:  .  senna (SENOKOT) 8.6 MG tablet, Take 1 tablet by mouth daily as needed for constipation., Disp: , Rfl:  .  traZODone (DESYREL) 50 MG tablet, Take 1 tablet (50 mg total) by mouth at bedtime as needed for sleep. For sleep (Patient taking differently: Take 50 mg by mouth at bedtime. For sleep), Disp: 30 tablet, Rfl: 0 .  venlafaxine XR (EFFEXOR-XR) 150 MG 24 hr capsule, Take 150 mg by mouth daily with breakfast. , Disp: , Rfl:  .  vitamin A 8000 UNIT capsule, Take 8,000 Units by mouth daily., Disp: , Rfl:  .  vitamin B-12 (CYANOCOBALAMIN) 1000 MCG tablet, Take 1,000 mcg by mouth daily., Disp: , Rfl:  .  vitamin C (ASCORBIC ACID) 500 MG tablet, Take 500 mg by mouth daily., Disp: , Rfl:  .  Amino  Acids-Protein Hydrolys (FEEDING SUPPLEMENT, PRO-STAT SUGAR FREE 64,) LIQD, Take 30 mLs by mouth 2 (two) times daily., Disp: 887 mL, Rfl: 0  Past Medical Problems: Past Medical History:  Diagnosis Date  . Anxiety   . Arthritis    joints  . Asthma   . Endometriosis   . Fat necrosis of breast    right  . GERD (gastroesophageal reflux disease)   . History of hiatal hernia    s/p repair few times  . History of suicide attempt 03/2017  . Hypertension   . MDD (major depressive disorder)   . Panniculitis   . Pneumonia 10/2018   in hospital with pneumonia  . PONV (postoperative nausea and   vomiting)   . Psoriasis    treated with Humeria    Past Surgical History: Past Surgical History:  Procedure Laterality Date  . ACHILLES TENDON REPAIR Left 01-11-2015  @[facility]   . BREAST CYST EXCISION Right 01/24/2019   Procedure: EXCISIN OF RIGHT BREAST FAT NECROSIS;  Surgeon: Wallace Going, DO;  Location: [facility];  Service: Plastics;  Laterality: Right;  . BREAST REDUCTION SURGERY Bilateral 01/09/2016   Procedure: MAMMARY REDUCTION  (BREAST) BILATERAL;  Surgeon: Wallace Going, DO;  Location: [facility];  Service: Plastics;  Laterality: Bilateral;  . CARPOMETACARPAL (CMC) FUSION OF THUMB Right 05-04-2015   @[facility]   . COMBINED HYSTEROSCOPY DIAGNOSTIC / D&C  06-23-2003     dr Ulanda Edison @[facility]   . D & C HYSTERSCOPY /  DX LAPAROSCOPY CONVERSION LAPAROTOMY LYSIS ADHESIONS WITH RIGHT SALPINECTOMY  12-26-2003    dr Quincy Simmonds  @[facility]   . DEBRIDEMENT AND CLOSURE WOUND N/A 02/07/2019   Procedure: DEBRIDEMENT OF ABDOMINAL WOUND, PLACEMENT OF A-CELL, PLACEMENT OF WOUND VAC;  Surgeon: Wallace Going, DO;  Location: [facility];  Service: Plastics;  Laterality: N/A;  . LAPAROSCOPIC CHOLECYSTECTOMY  10-19-2002   dr Deon Pilling  @[facility]   . LAPAROSCOPIC GASTRIC BANDING  09-25-2003  @[facility]    WITH HIATAL HERNIA REPAIR  . LAPAROSCOPIC LYSIS OF ADHESIONS N/A 01/24/2019   Procedure: LAPAROSCOPY WITH LYSIS OF ADHESIONS;   Surgeon: Johnathan Hausen, MD;  Location: [facility];  Service: General;  Laterality: N/A;  . LAPAROSCOPIC REPAIR AND REMOVAL OF GASTRIC BAND  02-06-2006  @[facility]   . LAPAROTOMY LYSIS ADHESIONS , UTERINE BIOPSY  01-31-2000   dr Corinna Capra  @[facility]   . [place] , REMOVAL Valetta Fuller BODY'S  05-10-2001   dr Deon Pilling  @[facility]   . PANNICULECTOMY N/A 01/24/2019   Procedure: PANNICULECTOMY;  Surgeon: Wallace Going, DO;  Location: [facility];  Service: Plastics;  Laterality: N/A;  . REPAIR RECURRENT  HIATAL HERNIA   04-23-2004  @[facility]   . TAKEDOWN RECURRENT HIATAL HERNIA FROM PREVIOUS MESH REPAIR OF DIAPHRAGM/ REPAIR HIATAL HERNIA AND THE GASTROPEXY  11/05/2004  . TONSILLECTOMY  age 69  . TOTAL ABDOMINAL HYSTERECTOMY W/ BILATERAL SALPINGOOPHORECTOMY  12-23-2005   dr soper   W/  BILATERAL URETEROLYSIS AND EXTENSIVE ABD. LYSIS ADHESIONS  . UMBILICAL HERNIA REPAIR  12/27/2009   LAPAROSCOPY TAKEDOWN INCARCERATED UMBILICAL HERNIA WITH REPAIR/ OPEN REPAIR COMPLEX LOWER INCISIONAL HERNIA    Social History: Social History   Socioeconomic History  . Marital status: Widowed    Spouse name: Not on file  . Number of children: Not on file  . Years of education: Not on file  . Highest education level: Not on file  Occupational History  . Not on file  Tobacco Use  . Smoking status: Former Smoker    Packs/day: 1.00    Years: 20.00    Pack years: 20.00    Types: Cigarettes    Quit date: 05/20/2015    Years since quitting: 4.0  . Smokeless tobacco: Never Used  Substance and Sexual Activity  . Alcohol use: No  . Drug use: Never  . Sexual activity: Yes    Birth control/protection: Surgical  Other Topics Concern  . Not on file  Social History Narrative   Mother lives with her.       Social Determinants of Health   Financial Resource Strain:   . Difficulty of Paying Living Expenses: Not on file  Food Insecurity:   . Worried About Charity fundraiser in the Last Year:  Not on file  . Ran Out  of Food in the Last Year: Not on file  Transportation Needs:   . Lack of Transportation (Medical): Not on file  . Lack of Transportation (Non-Medical): Not on file  Physical Activity:   . Days of Exercise per Week: Not on file  . Minutes of Exercise per Session: Not on file  Stress:   . Feeling of Stress : Not on file  Social Connections:   . Frequency of Communication with Friends and Family: Not on file  . Frequency of Social Gatherings with Friends and Family: Not on file  . Attends Religious Services: Not on file  . Active Member of Clubs or Organizations: Not on file  . Attends Banker Meetings: Not on file  . Marital Status: Not on file  Intimate Partner Violence:   . Fear of Current or Ex-Partner: Not on file  . Emotionally Abused: Not on file  . Physically Abused: Not on file  . Sexually Abused: Not on file    Family History: Family History  Problem Relation Age of Onset  . Cancer Maternal Aunt        melanoma  . Diabetes Father   . Heart attack Father 54    Review of Systems: Review of Systems  Constitutional: Negative for chills and fever.  Respiratory: Negative for shortness of breath and wheezing.   Cardiovascular: Negative for chest pain, palpitations and leg swelling.  Gastrointestinal: Negative for abdominal pain, nausea and vomiting.  Genitourinary: Negative.   Neurological: Negative.     Physical Exam: Vital Signs There were no vitals taken for this visit.  Physical Exam Constitutional:      Appearance: Normal appearance.  HENT:     Head: Normocephalic and atraumatic.  Cardiovascular:     Rate and Rhythm: Normal rate and regular rhythm.     Pulses: Normal pulses.     Heart sounds: Normal heart sounds.  Pulmonary:     Effort: Pulmonary effort is normal.     Breath sounds: Normal breath sounds.  Abdominal:     General: There is no distension.     Palpations: Abdomen is soft.     Tenderness: There is abdominal tenderness (near  panniculectomy incision/wound (Right lower)). There is no guarding.    Musculoskeletal:        General: No swelling. Normal range of motion.  Skin:    General: Skin is warm and dry.  Neurological:     General: No focal deficit present.     Mental Status: She is alert and oriented to person, place, and time. Mental status is at baseline.  Psychiatric:        Mood and Affect: Mood normal.        Behavior: Behavior normal.    Assessment/Plan: The patient is scheduled for excision of abdominal wound with possible closure, scheduled for 06/18/2018 with Dr. Ulice Bold on 06/22/18.   Patient provided consent form to thoroughly read through prior to her appointment. All of her questions were answered in regards to the surgery and post-operative period. Consent to be scanned into chart.   Rx sent to pharmacy  Covid test scheduled  Smoking status: quit ~ 2017 Caprini score: 5, high, recommend mechanical ppx and early ambulation. Possible chemoppx if sedentary.  Call with questions or concerns.   Electronically signed by: Kermit Balo Eleny Cortez, PA-C 06/10/2019 1:04 PM

## 2019-06-10 NOTE — Progress Notes (Signed)
Patient ID: [name], female    DOB: 10/11/1970, 49 y.o.   MRN: [id number]  Chief Complaint  Patient presents with  . Pre-op Exam    Patient her for H&P to discuss surgery options SX: 06/22/19 for excision of abdominal wounds with possible integra or cellerate and VAC closer     ICD-10-CM   1. Status post panniculectomy  Z98.890   2. Postoperative wound dehiscence, subsequent encounter  T81.31XD     History of Present Illness: [name] is a 49 y.o.  female who underwent panniculectomy and excision of right breast fat necrosis on 01/24/2019 with Dr. Foster Walter.  At the same time she also underwent laparoscopic with lysis of adhesions by Dr. Luretha Walter.  Postoperatively she had wound dehiscence of her pannus incision and underwent debridement of abdominal wound with placement of ACell and placement of wound VAC on 02/07/2019. After her debridement, the majority of her wound healed up nicely, but she did have some breakdown and developed two wounds along her lateral panniculectomy incision. She has continued to do local wound care over the past few months with improvement. Her left side has healed, but her right side is still open.   She presents for preoperative evaluation for upcoming procedure, excision of abdominal wound with possible closure, scheduled for 06/22/2018 with Dr. Ulice Walter.  Hx of PONV and reports slow to wake up with breast reduction surgery in past. Reports no issues with recent surgeries (panniculectomy, wound debridement). No hx of dvt/pe. No fmhx of dvt/pe. No hx of bleeding/clotting. No recent colds/illnesses. No recent sob, cp, palpitations. No hx of MI/CVA. Not taking any blood thinners.  She has been applying collagen and hydrogel to right abdomen wound daily with help from her mother. It is improving in size and depth, but the wound edge superiorly is not in contact with another wound edge. It is overlaying the epithelium along the  inferior abdominal fold. No erythema. No foul odor.  [name] reports she has noticed an odor over the past few weeks or so. She also reports she had some erythema around the wound/fold recently, but applied some cream that her mother was prescribed and this seemed to help. She is unsure the name of the cream, but it starts with a "c"  Past Medical History: Allergies: Allergies  Allergen Reactions  . Influenza Vaccines     States her "arm had a knot and turned red"  . Lisinopril-Hydrochlorothiazide     welts    Current Medications:  Current Outpatient Medications:  .  acetaminophen (TYLENOL) 500 MG tablet, Take 1,000 mg by mouth every 4 (four) hours as needed for fever or headache., Disp: , Rfl:  .  albuterol (PROVENTIL HFA;VENTOLIN HFA) 108 (90 BASE) MCG/ACT inhaler, Inhale 2 puffs into the lungs every 6 (six) hours as needed. For shortness of breath, Disp: , Rfl:  .  ALPRAZolam (XANAX) 0.5 MG tablet, Take 0.5 mg by mouth 3 (three) times daily as needed for anxiety. , Disp: , Rfl:  .  azelastine (ASTELIN) 137 MCG/SPRAY nasal spray, Place 2 sprays into both nostrils 2 (two) times daily as needed for rhinitis. Use in each nostril as directed, Disp: , Rfl:  .  b complex vitamins tablet, Take 1 tablet by mouth daily., Disp: , Rfl:  .  Biotin 12751 MCG TABS, Take 10,000 mcg by mouth daily., Disp: , Rfl:  .  buPROPion (WELLBUTRIN XL) 300 MG 24 hr tablet, Take 300 mg by  mouth daily., Disp: , Rfl:  .  cetirizine (ZYRTEC) 10 MG tablet, Take 10 mg by mouth daily as needed for allergies. , Disp: , Rfl:  .  collagenase (SANTYL) ointment, Apply 1 application topically every other day. Apply to right and left abdominal wounds every other day., Disp: 15 g, Rfl: 0 .  Ensure Max Protein (ENSURE MAX PROTEIN) LIQD, Take 330 mLs (11 oz total) by mouth 2 (two) times daily., Disp:  , Rfl:  .  gabapentin (NEURONTIN) 300 MG capsule, Take 300 mg by mouth 3 (three) times daily., Disp: , Rfl:  .   ipratropium-albuterol (DUONEB) 0.5-2.5 (3) MG/3ML SOLN, Take 3 mLs by nebulization every 6 (six) hours as needed. (Patient taking differently: Take 3 mLs by nebulization every 6 (six) hours as needed (asthma). ), Disp: 360 mL, Rfl: 0 .  mirtazapine (REMERON) 15 MG tablet, Take 1 tablet (15 mg total) by mouth at bedtime. For mood control, Disp: 30 tablet, Rfl: 0 .  montelukast (SINGULAIR) 10 MG tablet, Take 10 mg by mouth at bedtime. , Disp: , Rfl:  .  Multiple Vitamin (MULTIVITAMIN) tablet, Take 1 tablet by mouth daily., Disp: , Rfl:  .  nutrition supplement, JUVEN, (JUVEN) PACK, Take 1 packet by mouth 2 (two) times daily between meals., Disp: , Rfl: 0 .  olmesartan (BENICAR) 40 MG tablet, Take 40 mg by mouth every morning. , Disp: , Rfl:  .  pantoprazole (PROTONIX) 40 MG tablet, Take 40 mg by mouth 2 (two) times daily. , Disp: , Rfl:  .  polyethylene glycol (MIRALAX / GLYCOLAX) 17 g packet, Take 17 g by mouth daily as needed for mild constipation., Disp: 14 each, Rfl: 0 .  promethazine (PHENERGAN) 25 MG tablet, Take 25 mg by mouth every 8 (eight) hours as needed for nausea or vomiting. , Disp: , Rfl:  .  RESTASIS 0.05 % ophthalmic emulsion, Place 1 drop into both eyes 2 (two) times daily., Disp: , Rfl:  .  senna (SENOKOT) 8.6 MG tablet, Take 1 tablet by mouth daily as needed for constipation., Disp: , Rfl:  .  traZODone (DESYREL) 50 MG tablet, Take 1 tablet (50 mg total) by mouth at bedtime as needed for sleep. For sleep (Patient taking differently: Take 50 mg by mouth at bedtime. For sleep), Disp: 30 tablet, Rfl: 0 .  venlafaxine XR (EFFEXOR-XR) 150 MG 24 hr capsule, Take 150 mg by mouth daily with breakfast. , Disp: , Rfl:  .  vitamin A 8000 UNIT capsule, Take 8,000 Units by mouth daily., Disp: , Rfl:  .  vitamin B-12 (CYANOCOBALAMIN) 1000 MCG tablet, Take 1,000 mcg by mouth daily., Disp: , Rfl:  .  vitamin C (ASCORBIC ACID) 500 MG tablet, Take 500 mg by mouth daily., Disp: , Rfl:  .  Amino  Acids-Protein Hydrolys (FEEDING SUPPLEMENT, PRO-STAT SUGAR FREE 64,) LIQD, Take 30 mLs by mouth 2 (two) times daily., Disp: 887 mL, Rfl: 0  Past Medical Problems: Past Medical History:  Diagnosis Date  . Anxiety   . Arthritis    joints  . Asthma   . Endometriosis   . Fat necrosis of breast    right  . GERD (gastroesophageal reflux disease)   . History of hiatal hernia    s/p repair few times  . History of suicide attempt 03/2017  . Hypertension   . MDD (major depressive disorder)   . Panniculitis   . Pneumonia 10/2018   in hospital with pneumonia  . PONV (postoperative nausea and  vomiting)   . Psoriasis    treated with Humeria    Past Surgical History: Past Surgical History:  Procedure Laterality Date  . ACHILLES TENDON REPAIR Left 01-11-2015  @[facility]   . BREAST CYST EXCISION Right 01/24/2019   Procedure: EXCISIN OF RIGHT BREAST FAT NECROSIS;  Surgeon: Wallace Going, DO;  Location: [facility];  Service: Plastics;  Laterality: Right;  . BREAST REDUCTION SURGERY Bilateral 01/09/2016   Procedure: MAMMARY REDUCTION  (BREAST) BILATERAL;  Surgeon: Wallace Going, DO;  Location: [facility];  Service: Plastics;  Laterality: Bilateral;  . CARPOMETACARPAL (CMC) FUSION OF THUMB Right 05-04-2015   @[facility]   . COMBINED HYSTEROSCOPY DIAGNOSTIC / D&C  06-23-2003     dr Ulanda Edison @[facility]   . D & C HYSTERSCOPY /  DX LAPAROSCOPY CONVERSION LAPAROTOMY LYSIS ADHESIONS WITH RIGHT SALPINECTOMY  12-26-2003    dr Quincy Simmonds  @[facility]   . DEBRIDEMENT AND CLOSURE WOUND N/A 02/07/2019   Procedure: DEBRIDEMENT OF ABDOMINAL WOUND, PLACEMENT OF A-CELL, PLACEMENT OF WOUND VAC;  Surgeon: Wallace Going, DO;  Location: [facility];  Service: Plastics;  Laterality: N/A;  . LAPAROSCOPIC CHOLECYSTECTOMY  10-19-2002   dr Deon Pilling  @[facility]   . LAPAROSCOPIC GASTRIC BANDING  09-25-2003  @[facility]    WITH HIATAL HERNIA REPAIR  . LAPAROSCOPIC LYSIS OF ADHESIONS N/A 01/24/2019   Procedure: LAPAROSCOPY WITH LYSIS OF ADHESIONS;   Surgeon: Johnathan Hausen, MD;  Location: [facility];  Service: General;  Laterality: N/A;  . LAPAROSCOPIC REPAIR AND REMOVAL OF GASTRIC BAND  02-06-2006  @[facility]   . LAPAROTOMY LYSIS ADHESIONS , UTERINE BIOPSY  01-31-2000   dr Corinna Capra  @[facility]   . [place] , REMOVAL Valetta Fuller BODY'S  05-10-2001   dr Deon Pilling  @[facility]   . PANNICULECTOMY N/A 01/24/2019   Procedure: PANNICULECTOMY;  Surgeon: Wallace Going, DO;  Location: [facility];  Service: Plastics;  Laterality: N/A;  . REPAIR RECURRENT  HIATAL HERNIA   04-23-2004  @[facility]   . TAKEDOWN RECURRENT HIATAL HERNIA FROM PREVIOUS MESH REPAIR OF DIAPHRAGM/ REPAIR HIATAL HERNIA AND THE GASTROPEXY  11/05/2004  . TONSILLECTOMY  age 69  . TOTAL ABDOMINAL HYSTERECTOMY W/ BILATERAL SALPINGOOPHORECTOMY  12-23-2005   dr soper   W/  BILATERAL URETEROLYSIS AND EXTENSIVE ABD. LYSIS ADHESIONS  . UMBILICAL HERNIA REPAIR  12/27/2009   LAPAROSCOPY TAKEDOWN INCARCERATED UMBILICAL HERNIA WITH REPAIR/ OPEN REPAIR COMPLEX LOWER INCISIONAL HERNIA    Social History: Social History   Socioeconomic History  . Marital status: Widowed    Spouse name: Not on file  . Number of children: Not on file  . Years of education: Not on file  . Highest education level: Not on file  Occupational History  . Not on file  Tobacco Use  . Smoking status: Former Smoker    Packs/day: 1.00    Years: 20.00    Pack years: 20.00    Types: Cigarettes    Quit date: 05/20/2015    Years since quitting: 4.0  . Smokeless tobacco: Never Used  Substance and Sexual Activity  . Alcohol use: No  . Drug use: Never  . Sexual activity: Yes    Birth control/protection: Surgical  Other Topics Concern  . Not on file  Social History Narrative   Mother lives with her.       Social Determinants of Health   Financial Resource Strain:   . Difficulty of Paying Living Expenses: Not on file  Food Insecurity:   . Worried About Charity fundraiser in the Last Year:  Not on file  . Ran Out  of Food in the Last Year: Not on file  Transportation Needs:   . Lack of Transportation (Medical): Not on file  . Lack of Transportation (Non-Medical): Not on file  Physical Activity:   . Days of Exercise per Week: Not on file  . Minutes of Exercise per Session: Not on file  Stress:   . Feeling of Stress : Not on file  Social Connections:   . Frequency of Communication with Friends and Family: Not on file  . Frequency of Social Gatherings with Friends and Family: Not on file  . Attends Religious Services: Not on file  . Active Member of Clubs or Organizations: Not on file  . Attends Banker Meetings: Not on file  . Marital Status: Not on file  Intimate Partner Violence:   . Fear of Current or Ex-Partner: Not on file  . Emotionally Abused: Not on file  . Physically Abused: Not on file  . Sexually Abused: Not on file    Family History: Family History  Problem Relation Age of Onset  . Cancer Maternal Aunt        melanoma  . Diabetes Father   . Heart attack Father 54    Review of Systems: Review of Systems  Constitutional: Negative for chills and fever.  Respiratory: Negative for shortness of breath and wheezing.   Cardiovascular: Negative for chest pain, palpitations and leg swelling.  Gastrointestinal: Negative for abdominal pain, nausea and vomiting.  Genitourinary: Negative.   Neurological: Negative.     Physical Exam: Vital Signs There were no vitals taken for this visit.  Physical Exam Constitutional:      Appearance: Normal appearance.  HENT:     Head: Normocephalic and atraumatic.  Cardiovascular:     Rate and Rhythm: Normal rate and regular rhythm.     Pulses: Normal pulses.     Heart sounds: Normal heart sounds.  Pulmonary:     Effort: Pulmonary effort is normal.     Breath sounds: Normal breath sounds.  Abdominal:     General: There is no distension.     Palpations: Abdomen is soft.     Tenderness: There is abdominal tenderness (near  panniculectomy incision/wound (Right lower)). There is no guarding.    Musculoskeletal:        General: No swelling. Normal range of motion.  Skin:    General: Skin is warm and dry.  Neurological:     General: No focal deficit present.     Mental Status: She is alert and oriented to person, place, and time. Mental status is at baseline.  Psychiatric:        Mood and Affect: Mood normal.        Behavior: Behavior normal.    Assessment/Plan: The patient is scheduled for excision of abdominal wound with possible closure, scheduled for 06/18/2018 with Dr. Ulice Walter on 06/22/18.   Patient provided consent form to thoroughly read through prior to her appointment. All of her questions were answered in regards to the surgery and post-operative period. Consent to be scanned into chart.   Rx sent to pharmacy  Covid test scheduled  Smoking status: quit ~ 2017 Caprini score: 5, high, recommend mechanical ppx and early ambulation. Possible chemoppx if sedentary.  Call with questions or concerns.   Electronically signed by: Kermit Balo Janus Vlcek, PA-C 06/10/2019 1:04 PM

## 2019-06-13 ENCOUNTER — Telehealth: Payer: Self-pay

## 2019-06-13 NOTE — Telephone Encounter (Signed)
-----   Message from [place] sent at 06/09/2019 10:42 AM EST ----- Regarding: VAC IF the patient agrees to the surgery opening presented for 3/17, we will need a wound VAC. Please check with Loistine Simas after the appointment tomorrow before proceeding. Hopefully, someone will update me on this tomorrow afternoon.   Thank you! ----- Message ----- From: Peggye Form, DO Sent: 06/07/2019   6:10 PM EST To: Frederik Schmidt, Sabba Baig, Stephanie Price  Can you look at some possible dates for excision of abdominal wound with possible acell, vac and closure 1 hr anywhere

## 2019-06-13 NOTE — Telephone Encounter (Signed)
Matt advised after Pre-OP that prevena will be used instead of a wound VAC

## 2019-06-14 ENCOUNTER — Encounter (HOSPITAL_BASED_OUTPATIENT_CLINIC_OR_DEPARTMENT_OTHER): Payer: Self-pay | Admitting: Plastic Surgery

## 2019-06-14 ENCOUNTER — Other Ambulatory Visit: Payer: Self-pay

## 2019-06-17 ENCOUNTER — Encounter (HOSPITAL_BASED_OUTPATIENT_CLINIC_OR_DEPARTMENT_OTHER)
Admission: RE | Admit: 2019-06-17 | Discharge: 2019-06-17 | Disposition: A | Discharge status: Home / Self Care | Payer: Medicare Other | Source: Ambulatory Visit | Attending: Plastic Surgery | Admitting: Plastic Surgery

## 2019-06-17 ENCOUNTER — Other Ambulatory Visit: Payer: Self-pay

## 2019-06-17 NOTE — Progress Notes (Signed)
Anesthesia consult for BMI of 47. This was completed with Dr. Andres Ege. Pt cleared for surgery at [facility].

## 2019-06-18 ENCOUNTER — Other Ambulatory Visit (HOSPITAL_COMMUNITY)
Admission: RE | Admit: 2019-06-18 | Discharge: 2019-06-18 | Disposition: A | Discharge status: Home / Self Care | Payer: Medicare Other | Source: Ambulatory Visit | Attending: Plastic Surgery | Admitting: Plastic Surgery

## 2019-06-18 DIAGNOSIS — Z01812 Encounter for preprocedural laboratory examination: Secondary | ICD-10-CM | POA: Diagnosis present

## 2019-06-18 DIAGNOSIS — Z20822 Contact with and (suspected) exposure to covid-19: Secondary | ICD-10-CM | POA: Insufficient documentation

## 2019-06-18 LAB — SARS CORONAVIRUS 2 (TAT 6-24 HRS): SARS Coronavirus 2: NEGATIVE

## 2019-06-20 ENCOUNTER — Telehealth: Payer: Self-pay | Admitting: Plastic Surgery

## 2019-06-20 ENCOUNTER — Other Ambulatory Visit: Payer: Self-pay | Admitting: Surgical

## 2019-06-20 DIAGNOSIS — Z9889 Other specified postprocedural states: Secondary | ICD-10-CM

## 2019-06-20 DIAGNOSIS — T8131XD Disruption of external operation (surgical) wound, not elsewhere classified, subsequent encounter: Secondary | ICD-10-CM

## 2019-06-20 MED ORDER — HYDROCODONE-ACETAMINOPHEN 5-325 MG PO TABS: 1.0000 | ORAL_TABLET | Freq: Four times a day (QID) | ORAL | 0 refills | Status: AC | PRN

## 2019-06-20 MED ORDER — CEPHALEXIN 500 MG PO CAPS: 500.0000 mg | ORAL_CAPSULE | Freq: Four times a day (QID) | ORAL | 0 refills | Status: AC

## 2019-06-20 NOTE — Progress Notes (Signed)
Pre op prescriptions for abx and norco.

## 2019-06-20 NOTE — Telephone Encounter (Signed)
Returned patient's call. Advised Matt sent in medication to pharmacy.

## 2019-06-20 NOTE — Telephone Encounter (Signed)
Pt called to advise that her prescriptions had not been called in to the pharmacy and her surgery is Wednesday so she would like to pick up today or tomorrow. [name] was supposed to call In an antibiotic and a pain medication. Please call patient to advise once prescription has been sent to [facility] in [place].

## 2019-06-20 NOTE — Telephone Encounter (Signed)
Rx sent to pharmacy   

## 2019-06-22 ENCOUNTER — Encounter (HOSPITAL_BASED_OUTPATIENT_CLINIC_OR_DEPARTMENT_OTHER): Payer: Self-pay | Admitting: Plastic Surgery

## 2019-06-22 ENCOUNTER — Ambulatory Visit (HOSPITAL_BASED_OUTPATIENT_CLINIC_OR_DEPARTMENT_OTHER): Payer: Medicare Other | Admitting: Anesthesiology

## 2019-06-22 ENCOUNTER — Other Ambulatory Visit: Payer: Self-pay

## 2019-06-22 ENCOUNTER — Encounter (HOSPITAL_BASED_OUTPATIENT_CLINIC_OR_DEPARTMENT_OTHER)
Admission: RE | Disposition: A | Discharge status: Home / Self Care | Payer: Self-pay | Source: Ambulatory Visit | Attending: Plastic Surgery

## 2019-06-22 ENCOUNTER — Ambulatory Visit (HOSPITAL_BASED_OUTPATIENT_CLINIC_OR_DEPARTMENT_OTHER)
Admission: RE | Admit: 2019-06-22 | Discharge: 2019-06-22 | Disposition: A | Discharge status: Home / Self Care | Payer: Medicare Other | Source: Ambulatory Visit | Attending: Plastic Surgery | Admitting: Plastic Surgery

## 2019-06-22 DIAGNOSIS — L409 Psoriasis, unspecified: Secondary | ICD-10-CM | POA: Insufficient documentation

## 2019-06-22 DIAGNOSIS — Z79899 Other long term (current) drug therapy: Secondary | ICD-10-CM | POA: Insufficient documentation

## 2019-06-22 DIAGNOSIS — F329 Major depressive disorder, single episode, unspecified: Secondary | ICD-10-CM | POA: Insufficient documentation

## 2019-06-22 DIAGNOSIS — F419 Anxiety disorder, unspecified: Secondary | ICD-10-CM | POA: Diagnosis not present

## 2019-06-22 DIAGNOSIS — K219 Gastro-esophageal reflux disease without esophagitis: Secondary | ICD-10-CM | POA: Diagnosis not present

## 2019-06-22 DIAGNOSIS — I10 Essential (primary) hypertension: Secondary | ICD-10-CM | POA: Insufficient documentation

## 2019-06-22 DIAGNOSIS — J45909 Unspecified asthma, uncomplicated: Secondary | ICD-10-CM | POA: Diagnosis not present

## 2019-06-22 DIAGNOSIS — Y838 Other surgical procedures as the cause of abnormal reaction of the patient, or of later complication, without mention of misadventure at the time of the procedure: Secondary | ICD-10-CM | POA: Insufficient documentation

## 2019-06-22 DIAGNOSIS — T8131XA Disruption of external operation (surgical) wound, not elsewhere classified, initial encounter: Secondary | ICD-10-CM | POA: Insufficient documentation

## 2019-06-22 DIAGNOSIS — Z87891 Personal history of nicotine dependence: Secondary | ICD-10-CM | POA: Diagnosis not present

## 2019-06-22 DIAGNOSIS — T8130XA Disruption of wound, unspecified, initial encounter: Secondary | ICD-10-CM | POA: Diagnosis not present

## 2019-06-22 HISTORY — PX: INCISION AND DRAINAGE OF WOUND: SHX1803

## 2019-06-22 SURGERY — IRRIGATION AND DEBRIDEMENT WOUND
Anesthesia: General | Site: Abdomen | Laterality: Right

## 2019-06-22 MED ORDER — SODIUM CHLORIDE 0.9 % IV SOLN
INTRAVENOUS | Status: DC | PRN
  Administered 2019-06-22: 500 mL

## 2019-06-22 MED ORDER — FENTANYL CITRATE (PF) 100 MCG/2ML IJ SOLN: 25.0000 ug | INTRAMUSCULAR | Status: DC | PRN

## 2019-06-22 MED ORDER — SODIUM CHLORIDE 0.9 % IV SOLN: 250.0000 mL | INTRAVENOUS | Status: DC | PRN

## 2019-06-22 MED ORDER — FENTANYL CITRATE (PF) 100 MCG/2ML IJ SOLN
50.0000 ug | INTRAMUSCULAR | Status: DC | PRN
  Administered 2019-06-22: 100 ug via INTRAVENOUS

## 2019-06-22 MED ORDER — LIDOCAINE-EPINEPHRINE 1 %-1:100000 IJ SOLN
INTRAMUSCULAR | Status: DC | PRN
  Administered 2019-06-22: 16 mL via INTRADERMAL

## 2019-06-22 MED ORDER — CEFAZOLIN SODIUM-DEXTROSE 2-4 GM/100ML-% IV SOLN
INTRAVENOUS | Status: AC
  Filled 2019-06-22: qty 100

## 2019-06-22 MED ORDER — SODIUM CHLORIDE 0.9% FLUSH: 3.0000 mL | Freq: Two times a day (BID) | INTRAVENOUS | Status: DC

## 2019-06-22 MED ORDER — MIDAZOLAM HCL 2 MG/2ML IJ SOLN
1.0000 mg | INTRAMUSCULAR | Status: DC | PRN
  Administered 2019-06-22: 2 mg via INTRAVENOUS

## 2019-06-22 MED ORDER — OXYCODONE HCL 5 MG PO TABS: 5.0000 mg | ORAL_TABLET | Freq: Once | ORAL | Status: DC | PRN

## 2019-06-22 MED ORDER — CEFAZOLIN SODIUM-DEXTROSE 1-4 GM/50ML-% IV SOLN
INTRAVENOUS | Status: AC
  Filled 2019-06-22: qty 50

## 2019-06-22 MED ORDER — FENTANYL CITRATE (PF) 100 MCG/2ML IJ SOLN
INTRAMUSCULAR | Status: AC
  Filled 2019-06-22: qty 2

## 2019-06-22 MED ORDER — SODIUM CHLORIDE 0.9% FLUSH: 3.0000 mL | INTRAVENOUS | Status: DC | PRN

## 2019-06-22 MED ORDER — PHENYLEPHRINE HCL (PRESSORS) 10 MG/ML IV SOLN
INTRAVENOUS | Status: DC | PRN
  Administered 2019-06-22: 80 ug via INTRAVENOUS

## 2019-06-22 MED ORDER — DIPHENHYDRAMINE HCL 50 MG/ML IJ SOLN
INTRAMUSCULAR | Status: DC | PRN
  Administered 2019-06-22: 6.25 mg via INTRAVENOUS

## 2019-06-22 MED ORDER — OXYCODONE HCL 5 MG/5ML PO SOLN: 5.0000 mg | Freq: Once | ORAL | Status: DC | PRN

## 2019-06-22 MED ORDER — MIDAZOLAM HCL 2 MG/2ML IJ SOLN
INTRAMUSCULAR | Status: AC
  Filled 2019-06-22: qty 2

## 2019-06-22 MED ORDER — SCOPOLAMINE 1 MG/3DAYS TD PT72
1.0000 | MEDICATED_PATCH | TRANSDERMAL | Status: DC
  Administered 2019-06-22: 1.5 mg via TRANSDERMAL

## 2019-06-22 MED ORDER — ACETAMINOPHEN 650 MG RE SUPP: 650.0000 mg | RECTAL | Status: DC | PRN

## 2019-06-22 MED ORDER — OXYCODONE HCL 5 MG PO TABS: 5.0000 mg | ORAL_TABLET | ORAL | Status: DC | PRN

## 2019-06-22 MED ORDER — EPHEDRINE SULFATE 50 MG/ML IJ SOLN
INTRAMUSCULAR | Status: DC | PRN
  Administered 2019-06-22: 15 mg via INTRAVENOUS

## 2019-06-22 MED ORDER — DEXAMETHASONE SODIUM PHOSPHATE 4 MG/ML IJ SOLN
INTRAMUSCULAR | Status: DC | PRN
  Administered 2019-06-22: 10 mg via INTRAVENOUS

## 2019-06-22 MED ORDER — CHLORHEXIDINE GLUCONATE CLOTH 2 % EX PADS: 6.0000 | MEDICATED_PAD | Freq: Once | CUTANEOUS | Status: DC

## 2019-06-22 MED ORDER — OXYCODONE-ACETAMINOPHEN 5-325 MG PO TABS
1.0000 | ORAL_TABLET | Freq: Once | ORAL | Status: AC
  Administered 2019-06-22: 1 via ORAL

## 2019-06-22 MED ORDER — PROPOFOL 10 MG/ML IV BOLUS
INTRAVENOUS | Status: DC | PRN
  Administered 2019-06-22: 200 mg via INTRAVENOUS

## 2019-06-22 MED ORDER — MEPERIDINE HCL 25 MG/ML IJ SOLN: 6.2500 mg | INTRAMUSCULAR | Status: DC | PRN

## 2019-06-22 MED ORDER — DEXTROSE 5 % IV SOLN
3.0000 g | INTRAVENOUS | Status: AC
  Administered 2019-06-22: 13:00:00 3 g via INTRAVENOUS

## 2019-06-22 MED ORDER — ONDANSETRON HCL 4 MG/2ML IJ SOLN: 4.0000 mg | Freq: Once | INTRAMUSCULAR | Status: DC | PRN

## 2019-06-22 MED ORDER — ACETAMINOPHEN 325 MG PO TABS: 325.0000 mg | ORAL_TABLET | ORAL | Status: DC | PRN

## 2019-06-22 MED ORDER — ACETAMINOPHEN 325 MG PO TABS: 650.0000 mg | ORAL_TABLET | ORAL | Status: DC | PRN

## 2019-06-22 MED ORDER — OXYCODONE-ACETAMINOPHEN 5-325 MG PO TABS
ORAL_TABLET | ORAL | Status: AC
  Filled 2019-06-22: qty 1

## 2019-06-22 MED ORDER — ACETAMINOPHEN 160 MG/5ML PO SOLN: 325.0000 mg | ORAL | Status: DC | PRN

## 2019-06-22 MED ORDER — LACTATED RINGERS IV SOLN: INTRAVENOUS | Status: DC

## 2019-06-22 MED ORDER — PHENYLEPHRINE HCL-NACL 10-0.9 MG/250ML-% IV SOLN
INTRAVENOUS | Status: DC | PRN
  Administered 2019-06-22: 40 ug/min via INTRAVENOUS

## 2019-06-22 SURGICAL SUPPLY — 98 items
ADH SKN CLS APL DERMABOND .7 (GAUZE/BANDAGES/DRESSINGS)
APL SKNCLS STERI-STRIP NONHPOA (GAUZE/BANDAGES/DRESSINGS)
BAG DECANTER FOR FLEXI CONT (MISCELLANEOUS) ×3 IMPLANT
BENZOIN TINCTURE PRP APPL 2/3 (GAUZE/BANDAGES/DRESSINGS) IMPLANT
BLADE HEX COATED 2.75 (ELECTRODE) IMPLANT
BLADE MINI RND TIP GREEN BEAV (BLADE) IMPLANT
BLADE SURG 10 STRL SS (BLADE) IMPLANT
BLADE SURG 15 STRL LF DISP TIS (BLADE) ×1 IMPLANT
BLADE SURG 15 STRL SS (BLADE) ×2
BNDG COHESIVE 4X5 TAN STRL (GAUZE/BANDAGES/DRESSINGS) IMPLANT
BNDG ELASTIC 3X5.8 VLCR STR LF (GAUZE/BANDAGES/DRESSINGS) IMPLANT
BNDG ELASTIC 4X5.8 VLCR STR LF (GAUZE/BANDAGES/DRESSINGS) IMPLANT
BNDG ELASTIC 6X5.8 VLCR STR LF (GAUZE/BANDAGES/DRESSINGS) IMPLANT
BNDG GAUZE ELAST 4 BULKY (GAUZE/BANDAGES/DRESSINGS) ×1 IMPLANT
CANISTER SUCT 1200ML W/VALVE (MISCELLANEOUS) IMPLANT
CORD BIPOLAR FORCEPS 12FT (ELECTRODE) IMPLANT
COVER BACK TABLE 60X90IN (DRAPES) ×2 IMPLANT
COVER MAYO STAND STRL (DRAPES) ×2 IMPLANT
COVER WAND RF STERILE (DRAPES) IMPLANT
DECANTER SPIKE VIAL GLASS SM (MISCELLANEOUS) IMPLANT
DERMABOND ADVANCED (GAUZE/BANDAGES/DRESSINGS)
DERMABOND ADVANCED .7 DNX12 (GAUZE/BANDAGES/DRESSINGS) IMPLANT
DRAIN CHANNEL 15F RND FF W/TCR (WOUND CARE) ×1 IMPLANT
DRAIN CHANNEL 19F RND (DRAIN) IMPLANT
DRAIN PENROSE 1/2X12 LTX STRL (WOUND CARE) IMPLANT
DRAPE INCISE IOBAN 66X45 STRL (DRAPES) IMPLANT
DRAPE LAPAROSCOPIC ABDOMINAL (DRAPES) IMPLANT
DRAPE LAPAROTOMY 100X72 PEDS (DRAPES) ×1 IMPLANT
DRAPE U-SHAPE 76X120 STRL (DRAPES) ×1 IMPLANT
DRSG ADAPTIC 3X8 NADH LF (GAUZE/BANDAGES/DRESSINGS) IMPLANT
DRSG EMULSION OIL 3X3 NADH (GAUZE/BANDAGES/DRESSINGS) IMPLANT
DRSG HYDROCOLLOID 4X4 (GAUZE/BANDAGES/DRESSINGS) IMPLANT
DRSG OPSITE POSTOP 4X6 (GAUZE/BANDAGES/DRESSINGS) ×1 IMPLANT
DRSG PAD ABDOMINAL 8X10 ST (GAUZE/BANDAGES/DRESSINGS) IMPLANT
ELECT NDL TIP 2.8 STRL (NEEDLE) IMPLANT
ELECT NEEDLE TIP 2.8 STRL (NEEDLE) IMPLANT
ELECT REM PT RETURN 9FT ADLT (ELECTROSURGICAL) ×2
ELECTRODE REM PT RTRN 9FT ADLT (ELECTROSURGICAL) ×1 IMPLANT
EVACUATOR SILICONE 100CC (DRAIN) ×1 IMPLANT
GAUZE SPONGE 4X4 12PLY STRL (GAUZE/BANDAGES/DRESSINGS) ×2 IMPLANT
GAUZE SPONGE 4X4 12PLY STRL LF (GAUZE/BANDAGES/DRESSINGS) IMPLANT
GAUZE XEROFORM 1X8 LF (GAUZE/BANDAGES/DRESSINGS) IMPLANT
GAUZE XEROFORM 5X9 LF (GAUZE/BANDAGES/DRESSINGS) IMPLANT
GLOVE BIO SURGEON STRL SZ 6.5 (GLOVE) ×5 IMPLANT
GLOVE BIOGEL PI IND STRL 6.5 (GLOVE) IMPLANT
GLOVE BIOGEL PI INDICATOR 6.5 (GLOVE) ×1
GOWN STRL REUS W/ TWL LRG LVL3 (GOWN DISPOSABLE) ×2 IMPLANT
GOWN STRL REUS W/TWL LRG LVL3 (GOWN DISPOSABLE) ×4
IV NS IRRIG 3000ML ARTHROMATIC (IV SOLUTION) IMPLANT
MANIFOLD NEPTUNE II (INSTRUMENTS) IMPLANT
NDL HYPO 25X1 1.5 SAFETY (NEEDLE) IMPLANT
NDL PRECISIONGLIDE 27X1.5 (NEEDLE) IMPLANT
NEEDLE HYPO 25X1 1.5 SAFETY (NEEDLE) IMPLANT
NEEDLE PRECISIONGLIDE 27X1.5 (NEEDLE) IMPLANT
NS IRRIG 1000ML POUR BTL (IV SOLUTION) ×1 IMPLANT
PACK BASIN DAY SURGERY FS (CUSTOM PROCEDURE TRAY) ×2 IMPLANT
PADDING CAST ABS 3INX4YD NS (CAST SUPPLIES)
PADDING CAST ABS 4INX4YD NS (CAST SUPPLIES)
PADDING CAST ABS COTTON 3X4 (CAST SUPPLIES) IMPLANT
PADDING CAST ABS COTTON 4X4 ST (CAST SUPPLIES) IMPLANT
PENCIL SMOKE EVACUATOR (MISCELLANEOUS) ×2 IMPLANT
PIN SAFETY STERILE (MISCELLANEOUS) IMPLANT
SHEET MEDIUM DRAPE 40X70 STRL (DRAPES) ×2 IMPLANT
SLEEVE SCD COMPRESS KNEE MED (MISCELLANEOUS) ×1 IMPLANT
SPLINT FIBERGLASS 3X35 (CAST SUPPLIES) ×1 IMPLANT
SPLINT FIBERGLASS 4X30 (CAST SUPPLIES) IMPLANT
SPLINT PLASTER CAST XFAST 3X15 (CAST SUPPLIES) IMPLANT
SPLINT PLASTER XTRA FASTSET 3X (CAST SUPPLIES)
SPONGE LAP 18X18 RF (DISPOSABLE) ×3 IMPLANT
STAPLER VISISTAT 35W (STAPLE) IMPLANT
STOCKINETTE 4X48 STRL (DRAPES) IMPLANT
STOCKINETTE 6  STRL (DRAPES) ×2
STOCKINETTE 6 STRL (DRAPES) ×1 IMPLANT
STOCKINETTE IMPERVIOUS LG (DRAPES) IMPLANT
STRIP CLOSURE SKIN 1/2X4 (GAUZE/BANDAGES/DRESSINGS) IMPLANT
SUCTION FRAZIER HANDLE 10FR (MISCELLANEOUS)
SUCTION TUBE FRAZIER 10FR DISP (MISCELLANEOUS) IMPLANT
SURGILUBE 2OZ TUBE FLIPTOP (MISCELLANEOUS) IMPLANT
SUT MNCRL AB 4-0 PS2 18 (SUTURE) ×2 IMPLANT
SUT MON AB 3-0 SH 27 (SUTURE) ×2
SUT MON AB 3-0 SH27 (SUTURE) IMPLANT
SUT MON AB 5-0 PS2 18 (SUTURE) ×2 IMPLANT
SUT SILK 3 0 PS 1 (SUTURE) ×1 IMPLANT
SUT VIC AB 3-0 FS2 27 (SUTURE) IMPLANT
SUT VIC AB 5-0 P-3 18X BRD (SUTURE) IMPLANT
SUT VIC AB 5-0 P3 18 (SUTURE)
SUT VIC AB 5-0 PS2 18 (SUTURE) ×2 IMPLANT
SUT VICRYL 4-0 PS2 18IN ABS (SUTURE) IMPLANT
SWAB COLLECTION DEVICE MRSA (MISCELLANEOUS) IMPLANT
SWAB CULTURE ESWAB REG 1ML (MISCELLANEOUS) IMPLANT
SYR BULB IRRIGATION 50ML (SYRINGE) ×2 IMPLANT
SYR CONTROL 10ML LL (SYRINGE) ×2 IMPLANT
TAPE HYPAFIX 6X30 (GAUZE/BANDAGES/DRESSINGS) IMPLANT
TOWEL GREEN STERILE FF (TOWEL DISPOSABLE) ×2 IMPLANT
TRAY DSU PREP LF (CUSTOM PROCEDURE TRAY) ×2 IMPLANT
TUBE CONNECTING 20X1/4 (TUBING) ×2 IMPLANT
UNDERPAD 30X36 HEAVY ABSORB (UNDERPADS AND DIAPERS) ×1 IMPLANT
YANKAUER SUCT BULB TIP NO VENT (SUCTIONS) ×2 IMPLANT

## 2019-06-22 NOTE — Op Note (Signed)
DATE OF OPERATION: 06/22/2019  LOCATION: [facility]  PREOPERATIVE DIAGNOSIS: Abdominal wound  POSTOPERATIVE DIAGNOSIS: Same  PROCEDURE: Excision of abdominal wound skin, fat and scar tissue 3 x 8 x 4 cm with primary layered closure  SURGEON: Ibrahima Holberg Sanger Diana Armijo, DO  ASSISTANT: Joni Fears, PA  EBL: 5 cc  CONDITION: Stable  COMPLICATIONS: None  INDICATION: The patient, [name], is a 49 y.o. female born on 10-Mar-1971, is here for treatment of an abdominal wound.   PROCEDURE DETAILS:  The patient was seen prior to surgery and marked.  The IV antibiotics were given. The patient was taken to the operating room and given a general anesthetic. A standard time out was performed and all information was confirmed by those in the room. SCDs were placed.   The abdomen was prepped and draped with Betadine.  Lidocaine with epinephrine was injected at the periwound area.  This was for intraoperative hemostasis and postop pain control.  After waiting several minutes for the lidocaine to take effect the 10 blade was used to make an elliptical incision around the wound.  The excision was 3 x 8 x 4 cm.  I was able to remove it completely.  Hemostasis was achieved with electrocautery.  The excision included skin, fat and scar tissue.  The wound was irrigated with antibiotic solution and saline.  A #15 drain was placed and secured to the skin with a 4-0 silk.  The deep layers were closed with 3-0 Monocryl.  The following dermal layer was closed with 4-0 Monocryl.  The skin edges were loosely approximated with 4 and 5-0 Monocryl with vertical mattress sutures.  A sterile dressing was applied. The patient was allowed to wake up and taken to recovery room in stable condition at the end of the case. The family was notified at the end of the case.    The advanced practice practitioner (APP) assisted throughout the case.  The APP was essential in retraction and counter traction when  needed to make the case progress smoothly.  This retraction and assistance made it possible to see the tissue plans for the procedure.  The assistance was needed for blood control, tissue re-approximation and assisted with closure of the incision site.   The 21st Century Cures Act was signed into law in 2016 which includes the topic of electronic health records.  This provides immediate access to information in MyChart.  This includes consultation notes, operative notes, office notes, lab results and pathology reports.  If you have any questions about what you read please let us know at your next visit or call us at the office.  We are right here with you.

## 2019-06-22 NOTE — Transfer of Care (Signed)
Immediate Anesthesia Transfer of Care Note  Patient: [name]  Procedure(s) Performed: Excision of abdominal wound with closure (Right Abdomen)  Patient Location: PACU  Anesthesia Type:General  Level of Consciousness: awake, alert  and oriented  Airway & Oxygen Therapy: Patient Spontanous Breathing and Patient connected to face mask oxygen  Post-op Assessment: Report given to RN and Post -op Vital signs reviewed and stable  Post vital signs: Reviewed and stable  Last Vitals:  Vitals Value Taken Time  BP 117/76 06/22/19 1435  Temp 36.9 C 06/22/19 1435  Pulse 118 06/22/19 1438  Resp 12 06/22/19 1438  SpO2 91 % 06/22/19 1438  Vitals shown include unvalidated device data.  Last Pain:  Vitals:   06/22/19 1114  TempSrc: Oral  PainSc: 2          Complications: No apparent anesthesia complications

## 2019-06-22 NOTE — Anesthesia Preprocedure Evaluation (Addendum)
Anesthesia Evaluation  Patient identified by MRN, date of birth, ID band Patient awake    Reviewed: Allergy & Precautions, NPO status , Patient's Chart, lab work & pertinent test results  History of Anesthesia Complications (+) PONV and history of anesthetic complications  Airway Mallampati: II  TM Distance: >3 FB Neck ROM: Full    Dental no notable dental hx. (+) Teeth Intact   Pulmonary neg pulmonary ROS, pneumonia, resolved, former smoker,    Pulmonary exam normal breath sounds clear to auscultation       Cardiovascular hypertension, Pt. on medications negative cardio ROS   Rhythm:Regular Rate:Tachycardia  ECG: NSR, rate 92   Neuro/Psych PSYCHIATRIC DISORDERS Anxiety Depression negative neurological ROS     GI/Hepatic Neg liver ROS, hiatal hernia, GERD  Medicated and Controlled,  Endo/Other  negative endocrine ROS  Renal/GU negative Renal ROS  negative genitourinary   Musculoskeletal  (+) Arthritis , Osteoarthritis,    Abdominal (+) + obese,   Peds negative pediatric ROS (+)  Hematology negative hematology ROS (+)   Anesthesia Other Findings   Reproductive/Obstetrics negative OB ROS                            Anesthesia Physical  Anesthesia Plan  ASA: III  Anesthesia Plan: General   Post-op Pain Management:    Induction: Intravenous  PONV Risk Score and Plan: 4 or greater and Scopolamine patch - Pre-op, Midazolam, Dexamethasone, Ondansetron and Treatment may vary due to age or medical condition  Airway Management Planned: Oral ETT and LMA  Additional Equipment:   Intra-op Plan:   Post-operative Plan: Extubation in OR  Informed Consent: I have reviewed the patients History and Physical, chart, labs and discussed the procedure including the risks, benefits and alternatives for the proposed anesthesia with the patient or authorized representative who has indicated his/her  understanding and acceptance.     Dental advisory given  Plan Discussed with: CRNA and Anesthesiologist  Anesthesia Plan Comments: (Reviewed PAT note 01/20/2019, Jodell Cipro, PA-C)        Anesthesia Quick Evaluation

## 2019-06-22 NOTE — Anesthesia Procedure Notes (Signed)
Procedure Name: LMA Insertion Date/Time: 06/22/2019 1:18 PM Performed by: Ronnette Hila, CRNA Pre-anesthesia Checklist: Patient identified, Emergency Drugs available, Suction available and Patient being monitored Patient Re-evaluated:Patient Re-evaluated prior to induction Oxygen Delivery Method: Circle system utilized Preoxygenation: Pre-oxygenation with 100% oxygen Induction Type: IV induction Ventilation: Mask ventilation without difficulty LMA: LMA inserted LMA Size: 5.0 Number of attempts: 1 Airway Equipment and Method: Bite block Placement Confirmation: positive ETCO2 Tube secured with: Tape Dental Injury: Teeth and Oropharynx as per pre-operative assessment

## 2019-06-22 NOTE — Discharge Instructions (Addendum)
INSTRUCTIONS FOR AFTER SURGERY   You will likely have some questions about what to expect following your operation.  The following information will help you and your family understand what to expect when you are discharged from the hospital.  Following these guidelines will help ensure a smooth recovery and reduce risks of complications.  Postoperative instructions include information on: diet, wound care, medications and physical activity.  AFTER SURGERY Expect to go home after the procedure.  In some cases, you may need to spend one night in the hospital for observation.  DIET This surgery does not require a specific diet.  However, I have to mention that the healthier you eat the better your body can start healing. It is important to increasing your protein intake.  This means limiting the foods with added sugar.  Focus on fruits and vegetables and some meat.  If you have any liposuction during your procedure be sure to drink water.  If your urine is bright yellow, then it is concentrated, and you need to drink more water.  As a general rule after surgery, you should have 8 ounces of water every hour while awake.  If you find you are persistently nauseated or unable to take in liquids let us know.  NO TOBACCO USE or EXPOSURE.  This will slow your healing process and increase the risk of a wound.  WOUND CARE If you have a drain: Clean with baby wipes until the drain is removed.   If you have steri-strips / tape directly attached to your skin leave them in place. It is OK to get these wet.  No baths, pools or hot tubs for two weeks. We close your incision to leave the smallest and best-looking scar. No ointment or creams on your incisions until given the go ahead.  Especially not Neosporin (Too many skin reactions with this one).  A few weeks after surgery you can use Mederma and start massaging the scar. We ask you to wear your binder or sports bra for the first 6 weeks around the clock, including  while sleeping. This provides added comfort and helps reduce the fluid accumulation at the surgery site.  ACTIVITY No heavy lifting until cleared by the doctor.  It is OK to walk and climb stairs. In fact, moving your legs is very important to decrease your risk of a blood clot.  It will also help keep you from getting deconditioned.  Every 1 to 2 hours get up and walk for 5 minutes. This will help with a quicker recovery back to normal.  Let pain be your guide so you don't do too much.  NO, you cannot do the spring cleaning and don't plan on taking care of anyone else.  This is your time for TLC.   WORK Everyone returns to work at different times. As a rough guide, most people take at least 1 - 2 weeks off prior to returning to work. If you need documentation for your job, bring the forms to your postoperative follow up visit.  DRIVING Arrange for someone to bring you home from the hospital.  You may be able to drive a few days after surgery but not while taking any narcotics or valium.  BOWEL MOVEMENTS Constipation can occur after anesthesia and while taking pain medication.  It is important to stay ahead for your comfort.  We recommend taking Milk of Magnesia (2 tablespoons; twice a day) while taking the pain pills.  SEROMA This is fluid your body tried  to put in the surgical site.  This is normal but if it creates excessive pain and swelling let us know.  It usually decreases in a few weeks.  MEDICATIONS and PAIN CONTROL At your preoperative visit for you history and physical you were given the following medications: 1. An antibiotic: Start this medication when you get home and take according to the instructions on the bottle. 2. Zofran 4 mg:  This is to treat nausea and vomiting.  You can take this every 6 hours as needed and only if needed. 3. Norco (hydrocodone/acetaminophen) 5/325 mg:  This is only to be used after you have taken the motrin or the tylenol. Every 8 hours as needed. Over  the counter Medication to take: 4. Ibuprofen (Motrin) 600 mg:  Take this every 6 hours.  If you have additional pain then take 500 mg of the tylenol.  Only take the Norco after you have tried these two. 5. Miralax or stool softener of choice: Take this according to the bottle if you take the [place] Call your surgeon's office if any of the following occur: . Fever 101 degrees F or greater . Excessive bleeding or fluid from the incision site. . Pain that increases over time without aid from the medications . Redness, warmth, or pus draining from incision sites . Persistent nausea or inability to take in liquids . Severe misshapen area that underwent the operation.  [facility] Plastic Surgery Specialist  What is the benefit of having a drain?  During surgery your tissue layers are separated.  This raw surface stimulates your body to fill the space with serous fluid.  This is normal but you don't want that fluid to collect and prevent healing.  A fluid collection can also become infected.  The Jackson-Pratt (JP) drain is used to eliminate this collection of fluid and allow the tissue to heal together.    Jackson-Pratt (JP) bulb    How to care for your drainage and suction unit at home Your drainage catheter will be connected to a collection device. The vacuum caused when the device is compressed allows drainage to collect in the device.    Wendee Copp your hands with soap and water before and after touching the system. . Empty the JP drain every 12 hours once you get home from your procedure. . Record the fluid amount on the record sheet included. . Start with stripping the drain tube to push the clots or excess fluid to the bulb.  Do this by pinching the tube with one hand near your skin.  Then with the other hand squeeze the tubing and work it toward the bulb.  This should be done several times a day.  This may collapse the tube which will correct on its own.   . Use a safety pin to  attach your collection device to your clothing so there is no tension on the insertion site.   . If you have drainage at the skin insertion site, you can apply a gauze dressing and secure it with tape. . If the drain falls out, apply a gauze dressing over the drain insertion site and secure with tape.   To empty the collection device:   . Release the stopper on the top of the collection unit (bulb).  Signa Kell contents into a measuring container such as a plastic medicine cup.  . Record the day and amount of drainage on the attached sheet. . This should be done at  least twice a day.    To compress the Jackson-Pratt Bulb:  . Release the stopper at the top of the bulb. Marland Kitchen Squeeze the bulb tightly in your fist, squeezing air out of the bulb.  . Replace the stopper while the bulb is compressed.  . Be careful not to spill the contents when squeezing the bulb. . The drainage will start bright red and turn to pink and then yellow with time. . IMPORTANT: If the bulb is not squeezed before adding the stopper it will not draw out the fluid.  Care for the JP drain site and your skin daily:  . You may shower three days after surgery. . Secure the drain to a ribbon or cloth around your waist while showering so it does not pull out while showering. . Be sure your hands are cleaned with soap and water. . Use a clean wet cotton swab to clean the skin around the drain site.  . Use another cotton swab to place Vaseline or antibiotic ointment on the skin around the drain.     Contact your physician if any of the following occur:  Marland Kitchen The fluid in the bulb becomes cloudy. . Your temperature is greater than 101.4.  Marland Kitchen The incision opens. . If you have drainage at the skin insertion site, you can apply a gauze dressing and secure it with tape. . If the drain falls out, apply a gauze dressing over the drain insertion site and secure with tape.  . You will usually have more drainage when you are active than while you  rest or are asleep. If the drainage increases significantly or is bloody call the physician                             Bring this record with you to each office visit Date  Drainage Volume  Date   Drainage volume                                                                                                                                                                                          Post Anesthesia Home Care Instructions  Activity: Get plenty of rest for the remainder of the day. A responsible individual must stay with you for 24 hours following the procedure.  For the next 24 hours, DO NOT: -Drive a car -Advertising copywriter -Drink alcoholic beverages -Take any medication unless instructed by your physician -Make any legal decisions or sign important papers.  Meals: Start with liquid foods such as gelatin or soup. Progress to regular foods as tolerated. Avoid greasy, spicy,  heavy foods. If nausea and/or vomiting occur, drink only clear liquids until the nausea and/or vomiting subsides. Call your physician if vomiting continues.  Special Instructions/Symptoms: Your throat may feel dry or sore from the anesthesia or the breathing tube placed in your throat during surgery. If this causes discomfort, gargle with warm salt water. The discomfort should disappear within 24 hours.  If you had a scopolamine patch placed behind your ear for the management of post- operative nausea and/or vomiting:  1. The medication in the patch is effective for 72 hours, after which it should be removed.  Wrap patch in a tissue and discard in the trash. Wash hands thoroughly with soap and water. 2. You may remove the patch earlier than 72 hours if you experience unpleasant side effects which may include dry mouth, dizziness or visual disturbances. 3. Avoid touching the patch. Wash your hands with soap and water after contact with the patch.

## 2019-06-22 NOTE — Interval H&P Note (Signed)
History and Physical Interval Note:  06/22/2019 12:54 PM  [name]  has presented today for surgery, with the diagnosis of wound dehiscence.  The various methods of treatment have been discussed with the patient and family. After consideration of risks, benefits and other options for treatment, the patient has consented to  Procedure(s) with comments: Excision of abdominal wound with possible closure (Right) - Total case time is 1 hour POSSIBLE APPLICATION OF INTEGRA BILAYER OR CELLERATE (Right) POSSIBLE APPLICATION OF WOUND VAC (Right) as a surgical intervention.  The patient's history has been reviewed, patient examined, no change in status, stable for surgery.  I have reviewed the patient's chart and labs.  Questions were answered to the patient's satisfaction.     [name] [name]

## 2019-06-22 NOTE — Anesthesia Postprocedure Evaluation (Signed)
Anesthesia Post Note  Patient: [name]  Procedure(s) Performed: Excision of abdominal wound with closure (Right Abdomen)     Patient location during evaluation: PACU Anesthesia Type: General Level of consciousness: awake and alert and oriented Pain management: pain level controlled Vital Signs Assessment: post-procedure vital signs reviewed and stable Respiratory status: spontaneous breathing, nonlabored ventilation and respiratory function stable Cardiovascular status: blood pressure returned to baseline and stable Postop Assessment: no apparent nausea or vomiting Anesthetic complications: no    Last Vitals:  Vitals:   06/22/19 1500 06/22/19 1509  BP: 104/78   Pulse: (!) 114 (!) 111  Resp: 15 19  Temp:    SpO2: 90% 93%    Last Pain:  Vitals:   06/22/19 1500  TempSrc:   PainSc: 0-No pain                 [name] A.

## 2019-06-23 ENCOUNTER — Encounter: Payer: Self-pay | Admitting: *Deleted

## 2019-06-30 NOTE — Progress Notes (Deleted)
   Subjective:     Patient ID: [name], female    DOB: October 11, 1970, 49 y.o.   MRN: [id number]  No chief complaint on file.   HPI: The patient is a 49 y.o. female here for follow-up after excision of abdominal wounds skin, fat, and scar tissue (3 x 8 x 4 cm) with primary layered closure on 06/22/2019 with Dr. Ulice Bold.    Review of Systems   Objective:   Vital Signs There were no vitals taken for this visit. Vital Signs and Nursing Note Reviewed  Physical Exam    Assessment/Plan:  No diagnosis found.  ***   Eldridge Abrahams, PA-C 06/30/2019, 6:36 PM

## 2019-07-01 ENCOUNTER — Encounter: Payer: Medicare Other | Admitting: Plastic Surgery

## 2019-07-03 NOTE — Progress Notes (Signed)
Patient is a 49 year old female here for follow-up after excision of abdominal wound and closure on 06/22/2019 with Dr. Ulice Bold. She reports she is doing well.  Denies fever, CP, SOB, N/V.  Reports ~ 10 cc of serous fluid in drain every 24 hours. Incision is healing well, c/d/i. No signs of infection, redness, drainage, or seroma/hematoma. Skin around incision looks really good. Drain removed.   Apply Vaseline and gauze daily to drain site until closed.  Continue to apply dressing over incision. Follow-up in 2 weeks.  Call office with any questions/concerns or if condition worsens.  The 21st Century Cures Act was signed into law in 2016 which includes the topic of electronic health records.  This provides immediate access to information in MyChart.  This includes consultation notes, operative notes, office notes, lab results and pathology reports.  If you have any questions about what you read please let us know at your next visit or call us at the office.  We are right here with you.

## 2019-07-04 ENCOUNTER — Encounter: Payer: Self-pay | Admitting: Plastic Surgery

## 2019-07-04 ENCOUNTER — Ambulatory Visit (INDEPENDENT_AMBULATORY_CARE_PROVIDER_SITE_OTHER): Payer: Medicare Other | Admitting: Plastic Surgery

## 2019-07-04 ENCOUNTER — Other Ambulatory Visit: Payer: Self-pay

## 2019-07-04 VITALS — BP 130/80 | HR 64 | Temp 97.8°F | Ht 71.0 in | Wt 327.0 lb

## 2019-07-04 DIAGNOSIS — M793 Panniculitis, unspecified: Secondary | ICD-10-CM

## 2019-07-19 DIAGNOSIS — Z9889 Other specified postprocedural states: Secondary | ICD-10-CM | POA: Insufficient documentation

## 2019-07-19 NOTE — Progress Notes (Signed)
Patient is a 49-yr-old female here for follow-up after excision of abdominal wound and closure on 06/22/19 with Dr. Kathrin Penner removed on 07/04/19.   [name] reports she is doing very well today.  No complaints.  Reports she is not having to use any bandages or gauze as she is no longer having any kind of drainage.  Incision is healing very well, C/D/I.  No signs of infection, redness, swelling, drainage.  Pictures were obtained of the patient and placed in the chart with the patient's or guardian's permission.  May continue showering as normal.  May begin to slowly resume activities as tolerated.  May resume full activity at 6 weeks postop.  May resume activities in the pool after 1 more week.  Avoid any fresh body of water until 2 months postop.  Follow-up as needed.  Call office with any questions/concerns or if condition changes.  The 21st Century Cures Act was signed into law in 2016 which includes the topic of electronic health records.  This provides immediate access to information in MyChart.  This includes consultation notes, operative notes, office notes, lab results and pathology reports.  If you have any questions about what you read please let us know at your next visit or call us at the office.  We are right here with you.

## 2019-07-20 ENCOUNTER — Ambulatory Visit (INDEPENDENT_AMBULATORY_CARE_PROVIDER_SITE_OTHER): Payer: Medicare Other | Admitting: Plastic Surgery

## 2019-07-20 ENCOUNTER — Encounter: Payer: Self-pay | Admitting: Plastic Surgery

## 2019-07-20 ENCOUNTER — Other Ambulatory Visit: Payer: Self-pay

## 2019-07-20 DIAGNOSIS — Z9889 Other specified postprocedural states: Secondary | ICD-10-CM

## 2019-10-09 ENCOUNTER — Encounter (HOSPITAL_COMMUNITY): Admission: RE | Payer: Self-pay | Source: Home / Self Care

## 2019-10-09 SURGERY — REPAIR, HERNIA, VENTRAL, LAPAROSCOPIC
Anesthesia: General

## 2020-02-29 IMAGING — CT CT ANGIOGRAPHY CHEST
2 of 6 series · 18 of 46 positions shown · IV contrast (ISOVUE)
Comparison: Multiple priors, most recent radiograph November 05, 2018

CLINICAL DATA: Chest pain, suspected PE, history of recent
pneumonia

EXAM:
CT ANGIOGRAPHY CHEST WITH CONTRAST
TECHNIQUE: Multidetector CT imaging of the chest was performed using the
standard protocol during bolus administration of intravenous
contrast. Multiplanar CT image reconstructions and MIPs were
obtained to evaluate the vascular anatomy.
CONTRAST:  100mL OMNIPAQUE IOHEXOL 350 MG/ML SOLN

[Series 5: thins · axial · 0.66mm/px · z∈[-184,+69]mm · 15 of 279 slices shown]
[im 13/279  lung]
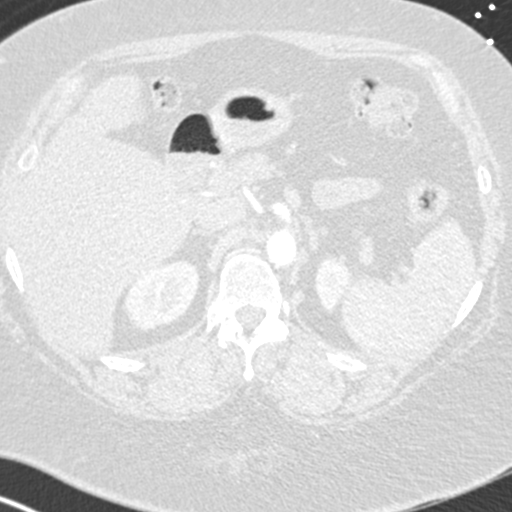
[im 37/279  soft-tissue]
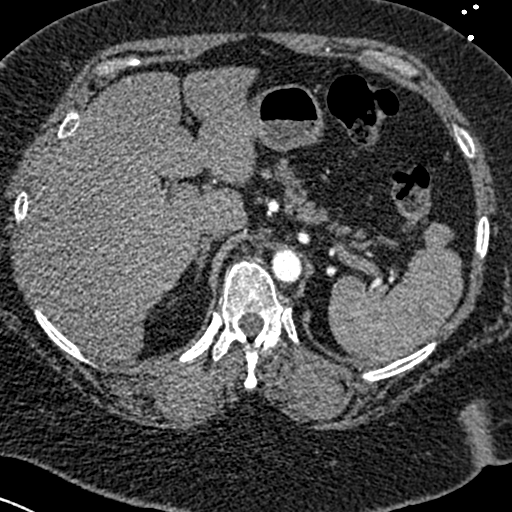
[im 49/279  lung]
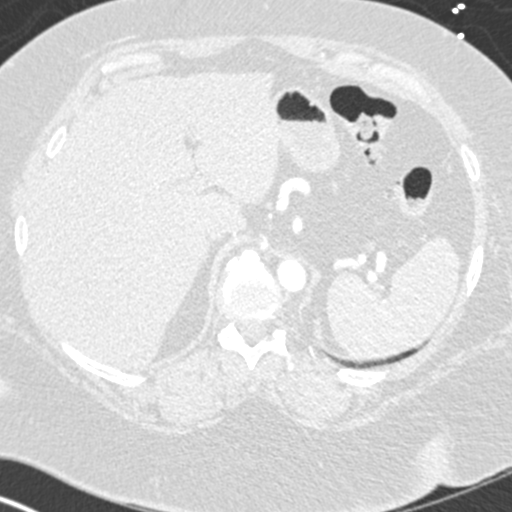
[im 73/279  soft-tissue]
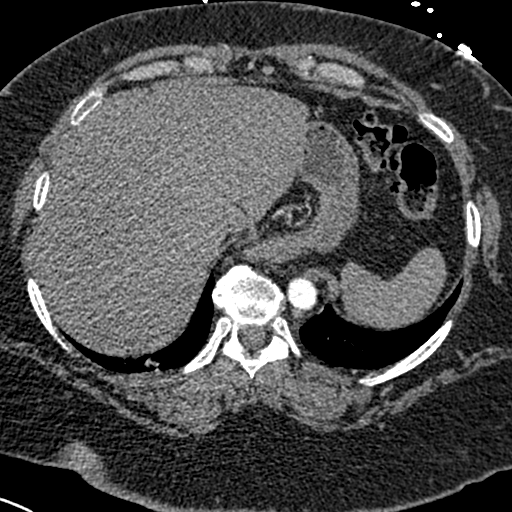
[im 85/279  lung]
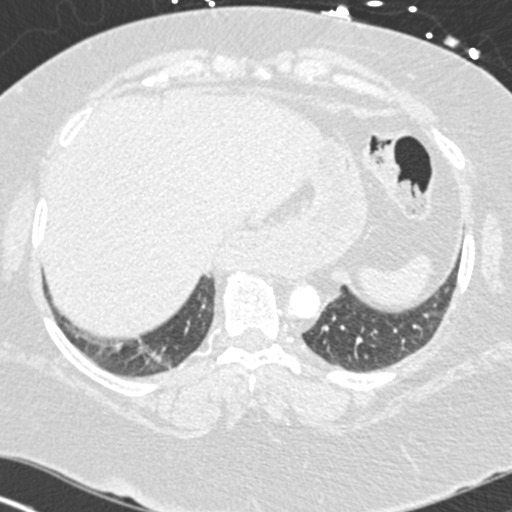
[im 109/279  soft-tissue]
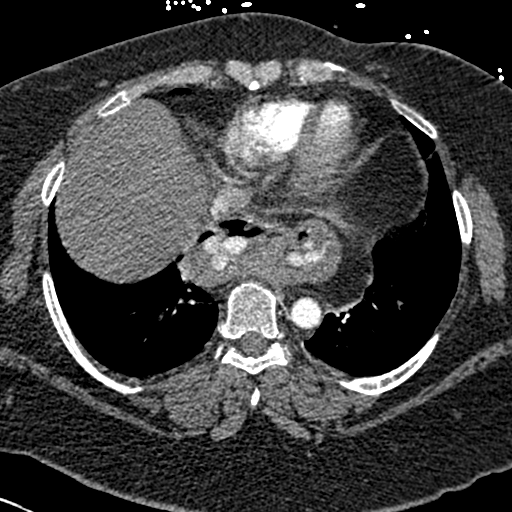
[im 121/279  lung]
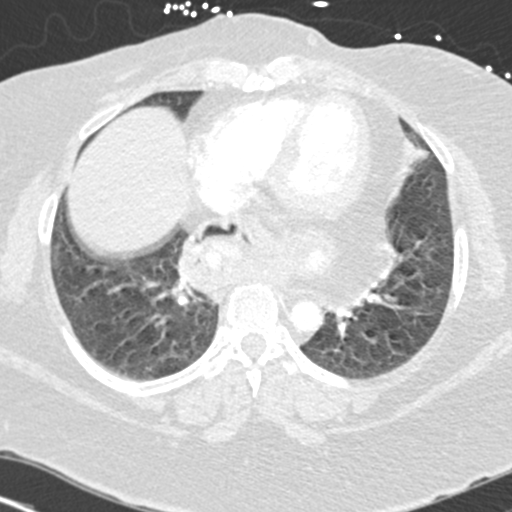
[im 146/279  soft-tissue]
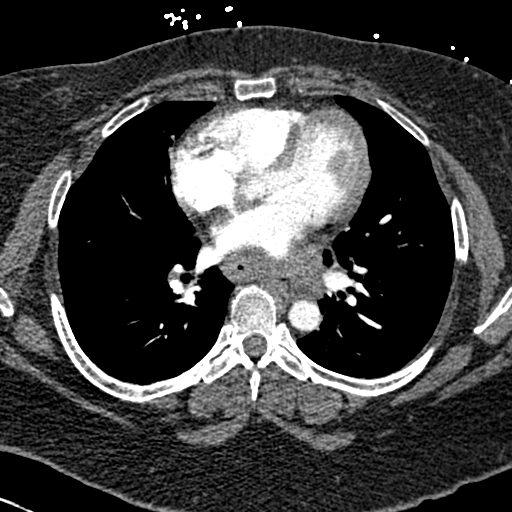
[im 158/279  lung]
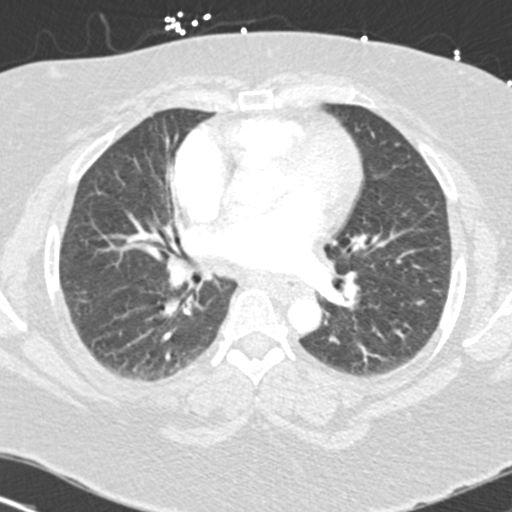
[im 170/279  soft-tissue]
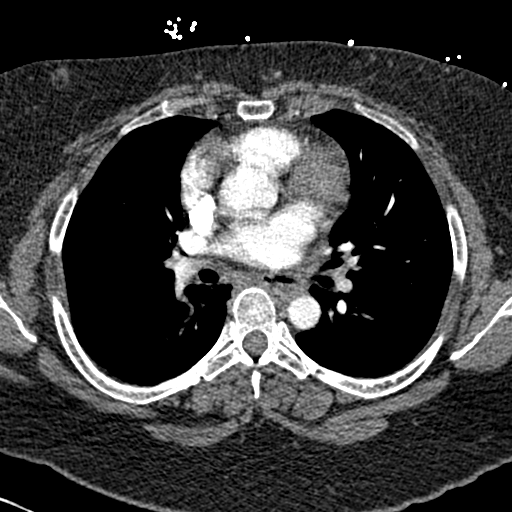
[im 194/279  lung]
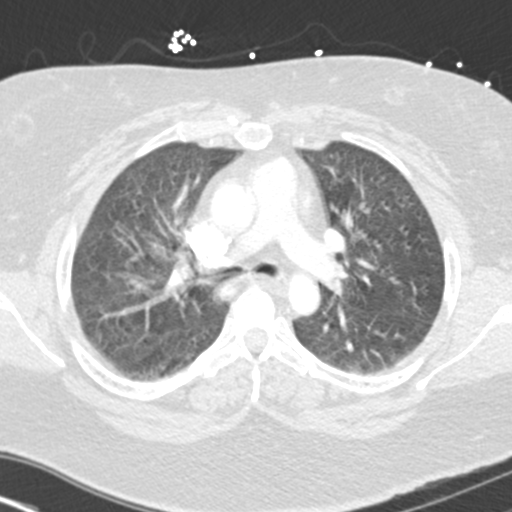
[im 206/279  soft-tissue]
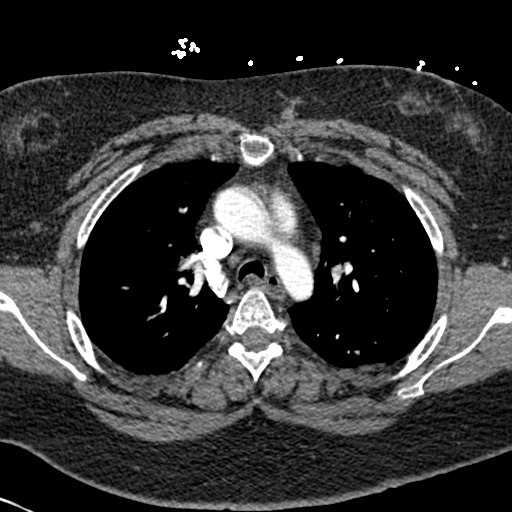
[im 230/279  lung]
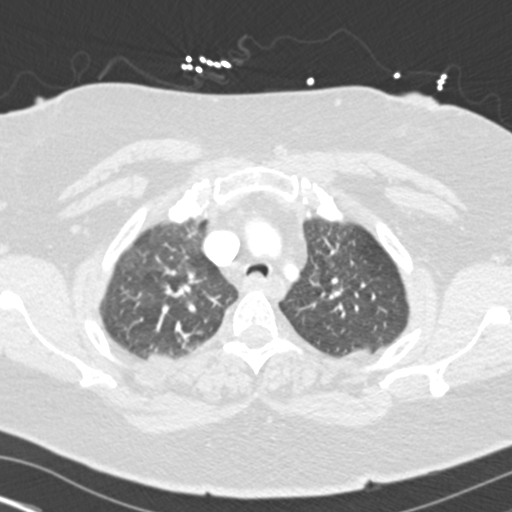
[im 242/279  soft-tissue]
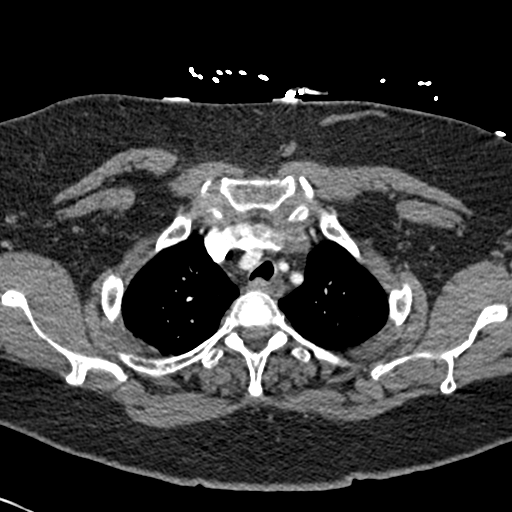
[im 266/279  lung]
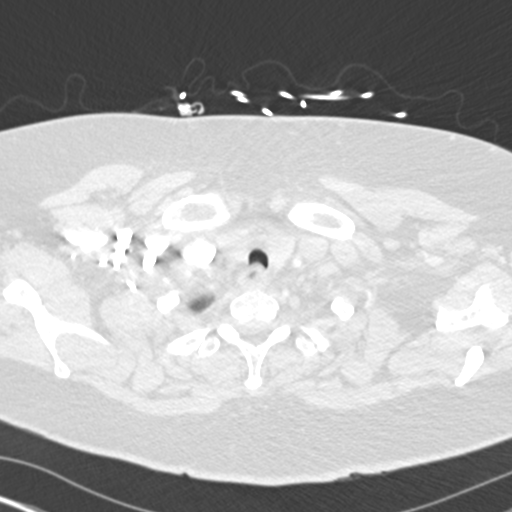

[Series 7: coronal mpr · coronal · 0.57mm/px · 3 of 140 slices shown]
[im 35/140  soft-tissue]
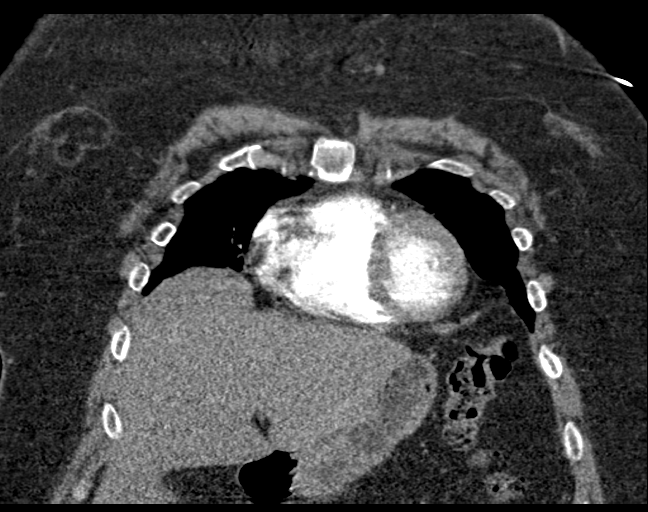
[im 70/140  soft-tissue]
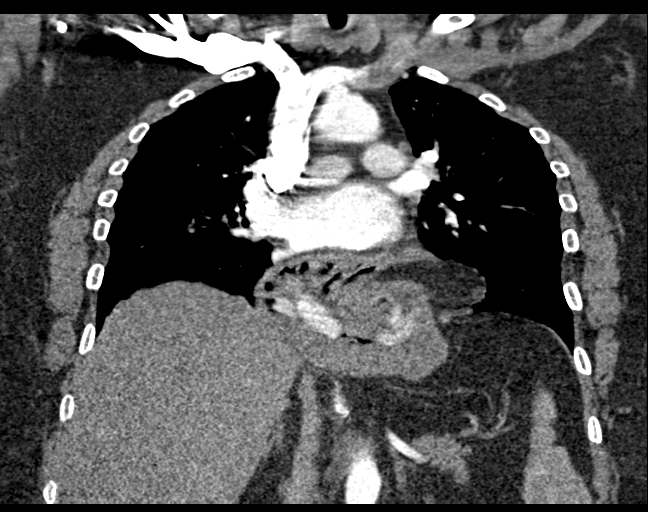
[im 105/140  soft-tissue]
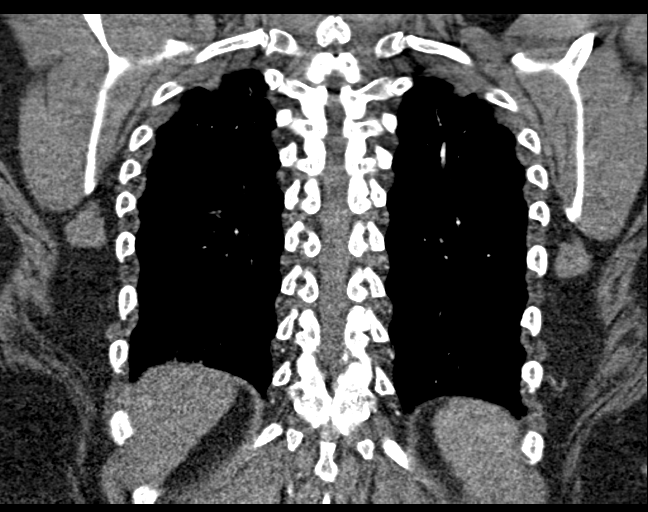

[18 of 46 positions shown; findings below may reference images not displayed]

FINDINGS: Cardiovascular: There is a slightly suboptimal opacification of the
pulmonary arteries. However there is no central,segmental filling
defects within the pulmonary arteries. The heart is normal in size.
No pericardial effusion thickening. No evidence right heart strain.
There is normal three-vessel brachiocephalic anatomy without
proximal stenosis. The thoracic aorta is normal in appearance.

Mediastinum/Nodes: No hilar, mediastinal, or axillary adenopathy.
Thyroid gland, trachea, and esophagus demonstrate no significant
findings.

Lungs/Pleura: Minimal streaky opacity of both lung bases, likely
atelectasis. No large airspace consolidation. No pleural effusion.

Upper Abdomen: There is a large paraesophageal hernia with retained
food stuffs and debris. Surgical clips seen in the gallbladder bed.

Musculoskeletal: No chest wall abnormality. No acute or significant
osseous findings.

Review of the MIP images confirms the above findings.
IMPRESSION: 1. Slightly suboptimal pulmonary artery opacification, however no
central or segmental pulmonary embolism.
2. Large paraesophageal hernia with retained foodstuff and debris.
3. Minimal basilar atelectasis.

## 2020-06-01 IMAGING — DX DG CHEST 1V
1 series · 1 of 1 positions shown · non-contrast
Comparison: 11/05/2018

CLINICAL DATA: Recently postop from panniculectomy. Chills.

EXAM:
CHEST  1 VIEW

[chest ap]
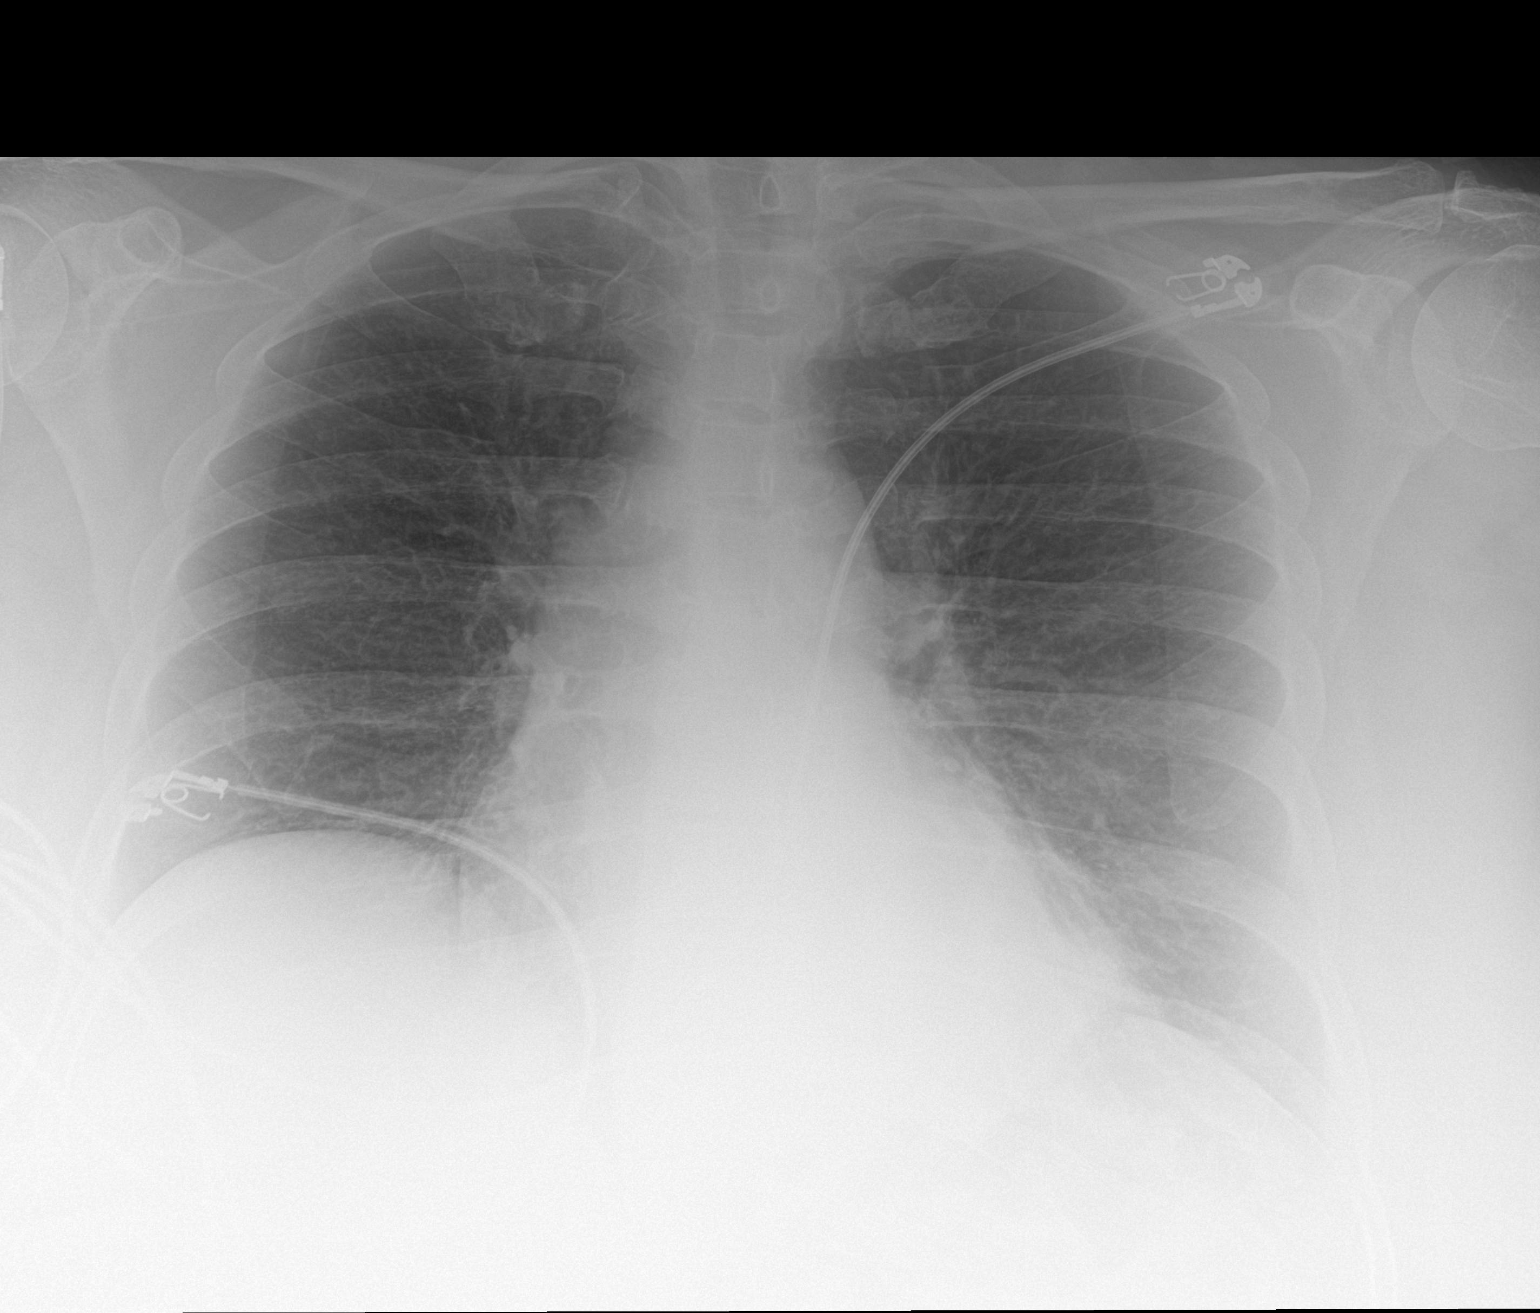

[1 of 1 positions shown; findings below may reference images not displayed]

FINDINGS: New opacity is seen in the medial left lower lobe, which may be due
to atelectasis or pneumonia. Right lung is clear. No evidence of
pleural effusion. Heart size remains normal.
IMPRESSION: New opacity in medial left lower lobe, which may be due to postop
atelectasis or pneumonia.

## 2020-08-02 ENCOUNTER — Emergency Department (HOSPITAL_BASED_OUTPATIENT_CLINIC_OR_DEPARTMENT_OTHER)
Admission: EM | Admit: 2020-08-02 | Discharge: 2020-08-02 | Disposition: A | Discharge status: Home / Self Care | Payer: Medicare Other | Attending: Emergency Medicine | Admitting: Emergency Medicine

## 2020-08-02 ENCOUNTER — Emergency Department (HOSPITAL_BASED_OUTPATIENT_CLINIC_OR_DEPARTMENT_OTHER): Payer: Medicare Other

## 2020-08-02 ENCOUNTER — Other Ambulatory Visit: Payer: Self-pay

## 2020-08-02 ENCOUNTER — Encounter (HOSPITAL_BASED_OUTPATIENT_CLINIC_OR_DEPARTMENT_OTHER): Payer: Self-pay | Admitting: *Deleted

## 2020-08-02 DIAGNOSIS — R1030 Lower abdominal pain, unspecified: Secondary | ICD-10-CM | POA: Diagnosis present

## 2020-08-02 DIAGNOSIS — Z79899 Other long term (current) drug therapy: Secondary | ICD-10-CM | POA: Diagnosis not present

## 2020-08-02 DIAGNOSIS — M793 Panniculitis, unspecified: Secondary | ICD-10-CM | POA: Diagnosis not present

## 2020-08-02 DIAGNOSIS — F17210 Nicotine dependence, cigarettes, uncomplicated: Secondary | ICD-10-CM | POA: Diagnosis not present

## 2020-08-02 DIAGNOSIS — R1084 Generalized abdominal pain: Secondary | ICD-10-CM

## 2020-08-02 DIAGNOSIS — I10 Essential (primary) hypertension: Secondary | ICD-10-CM | POA: Insufficient documentation

## 2020-08-02 DIAGNOSIS — R000 Tachycardia, unspecified: Secondary | ICD-10-CM | POA: Insufficient documentation

## 2020-08-02 DIAGNOSIS — J45909 Unspecified asthma, uncomplicated: Secondary | ICD-10-CM | POA: Diagnosis not present

## 2020-08-02 DIAGNOSIS — K219 Gastro-esophageal reflux disease without esophagitis: Secondary | ICD-10-CM | POA: Insufficient documentation

## 2020-08-02 LAB — URINALYSIS, ROUTINE W REFLEX MICROSCOPIC
Bilirubin Urine: NEGATIVE
Glucose, UA: NEGATIVE mg/dL
Hgb urine dipstick: NEGATIVE
Ketones, ur: NEGATIVE mg/dL
Leukocytes,Ua: NEGATIVE
Nitrite: NEGATIVE
Protein, ur: NEGATIVE mg/dL
Specific Gravity, Urine: 1.025 (ref 1.005–1.030)
pH: 5.5 (ref 5.0–8.0)

## 2020-08-02 LAB — CBC WITH DIFFERENTIAL/PLATELET
Abs Immature Granulocytes: 0.03 10*3/uL (ref 0.00–0.07)
Basophils Absolute: 0.1 10*3/uL (ref 0.0–0.1)
Basophils Relative: 1 %
Eosinophils Absolute: 0.3 10*3/uL (ref 0.0–0.5)
Eosinophils Relative: 2 %
HCT: 43.6 % (ref 36.0–46.0)
Hemoglobin: 14.2 g/dL (ref 12.0–15.0)
Immature Granulocytes: 0 %
Lymphocytes Relative: 31 %
Lymphs Abs: 3.2 10*3/uL (ref 0.7–4.0)
MCH: 31.6 pg (ref 26.0–34.0)
MCHC: 32.6 g/dL (ref 30.0–36.0)
MCV: 97.1 fL (ref 80.0–100.0)
Monocytes Absolute: 0.5 10*3/uL (ref 0.1–1.0)
Monocytes Relative: 5 %
Neutro Abs: 6.2 10*3/uL (ref 1.7–7.7)
Neutrophils Relative %: 61 %
Platelets: 234 10*3/uL (ref 150–400)
RBC: 4.49 MIL/uL (ref 3.87–5.11)
RDW: 14.4 % (ref 11.5–15.5)
WBC: 10.2 10*3/uL (ref 4.0–10.5)
nRBC: 0 % (ref 0.0–0.2)

## 2020-08-02 LAB — COMPREHENSIVE METABOLIC PANEL
ALT: 18 U/L (ref 0–44)
AST: 18 U/L (ref 15–41)
Albumin: 4.1 g/dL (ref 3.5–5.0)
Alkaline Phosphatase: 65 U/L (ref 38–126)
Anion gap: 9 (ref 5–15)
BUN: 17 mg/dL (ref 6–20)
CO2: 28 mmol/L (ref 22–32)
Calcium: 9.4 mg/dL (ref 8.9–10.3)
Chloride: 101 mmol/L (ref 98–111)
Creatinine, Ser: 1 mg/dL (ref 0.44–1.00)
GFR, Estimated: >60 mL/min (ref >60)
Glucose, Bld: 85 mg/dL (ref 70–99)
Potassium: 4.2 mmol/L (ref 3.5–5.1)
Sodium: 138 mmol/L (ref 135–145)
Total Bilirubin: 0.4 mg/dL (ref 0.3–1.2)
Total Protein: 7.3 g/dL (ref 6.5–8.1)

## 2020-08-02 LAB — LIPASE, BLOOD: Lipase: 18 U/L (ref 11–51)

## 2020-08-02 MED ORDER — IOHEXOL 300 MG/ML  SOLN
100.0000 mL | Freq: Once | INTRAMUSCULAR | Status: AC | PRN
  Administered 2020-08-02: 100 mL via INTRAVENOUS

## 2020-08-02 MED ORDER — MORPHINE SULFATE (PF) 4 MG/ML IV SOLN
4.0000 mg | Freq: Once | INTRAVENOUS | Status: AC
  Administered 2020-08-02: 4 mg via INTRAVENOUS
  Filled 2020-08-02: qty 1

## 2020-08-02 MED ORDER — ONDANSETRON HCL 4 MG/2ML IJ SOLN
4.0000 mg | Freq: Once | INTRAMUSCULAR | Status: AC
  Administered 2020-08-02: 4 mg via INTRAVENOUS
  Filled 2020-08-02: qty 2

## 2020-08-02 MED ORDER — MORPHINE SULFATE (PF) 4 MG/ML IV SOLN
4.0000 mg | Freq: Once | INTRAVENOUS | Status: AC
Start: 2020-08-02 — End: 2020-08-02
  Administered 2020-08-02: 4 mg via INTRAVENOUS
  Filled 2020-08-02: qty 1

## 2020-08-02 MED ORDER — CEPHALEXIN 500 MG PO CAPS: 500.0000 mg | ORAL_CAPSULE | Freq: Four times a day (QID) | ORAL | 0 refills | Status: DC

## 2020-08-02 MED ORDER — HYDROCODONE-ACETAMINOPHEN 5-325 MG PO TABS: 1.0000 | ORAL_TABLET | ORAL | 0 refills | Status: DC | PRN

## 2020-08-02 MED ORDER — SODIUM CHLORIDE 0.9 % IV SOLN: Freq: Once | INTRAVENOUS | Status: AC

## 2020-08-02 NOTE — ED Notes (Signed)
ED Provider at bedside. 

## 2020-08-02 NOTE — ED Provider Notes (Signed)
[facility] EMERGENCY DEPT Provider Note   CSN: [id number] Arrival date & time: 08/02/20  1637     History Chief Complaint  Patient presents with  . Abdominal Pain    [name] is a 50 y.o. female.  Pt presents to the ED today with abdominal pain.  The pt started around 1100 after having a bowel movement.  She said she felt a pop in her lower abdomen.  She said it radiates into her groin and feels like a burning sensation.  Pt denies n/v.  No f/c.  She does have some back pain with it as well.          Past Medical History:  Diagnosis Date  . Anxiety   . Arthritis    joints  . Asthma   . Endometriosis   . Fat necrosis of breast    right  . GERD (gastroesophageal reflux disease)   . History of hiatal hernia    s/p repair few times  . History of suicide attempt 03/2017  . Hypertension   . MDD (major depressive disorder)   . Panniculitis   . Pneumonia 10/2018   in hospital with pneumonia  . PONV (postoperative nausea and vomiting)   . Psoriasis    treated with Humeria    Patient Active Problem List   Diagnosis Date Noted  . S/P panniculectomy 07/19/2019  . Wound dehiscence 02/06/2019  . Pneumonia 09/23/2018  . Depression with anxiety 09/23/2018  . Psoriasis 09/23/2018  . Tachyarrhythmia   . Ventral hernia 06/09/2018  . Breast, fat necrosis 05/11/2018  . Posttraumatic stress disorder 04/05/2018  . Panniculitis 03/10/2018  . Major depressive disorder, single episode, unspecified 04/02/2017  . MDD (major depressive disorder), recurrent episode, severe (HCC) 03/24/2017  . Symptomatic mammary hypertrophy 01/09/2016  . Nicotine dependence 12/25/2015    Past Surgical History:  Procedure Laterality Date  . ACHILLES TENDON REPAIR Left 01-11-2015  @[facility]   . BREAST CYST EXCISION Right 01/24/2019   Procedure: EXCISIN OF RIGHT BREAST FAT NECROSIS;  Surgeon: 01/26/2019, DO;  Location: [facility];  Service: Plastics;  Laterality: Right;  .  BREAST REDUCTION SURGERY Bilateral 01/09/2016   Procedure: MAMMARY REDUCTION  (BREAST) BILATERAL;  Surgeon: 03/10/2016, DO;  Location: [facility];  Service: Plastics;  Laterality: Bilateral;  . CARPOMETACARPAL (CMC) FUSION OF THUMB Right 05-04-2015   @[facility]   . COMBINED HYSTEROSCOPY DIAGNOSTIC / D&C  06-23-2003     dr @[facility]   . D & C HYSTERSCOPY /  DX LAPAROSCOPY CONVERSION LAPAROTOMY LYSIS ADHESIONS WITH RIGHT SALPINECTOMY  12-26-2003    dr Ambrose Mantle  @[facility]   . DEBRIDEMENT AND CLOSURE WOUND N/A 02/07/2019   Procedure: DEBRIDEMENT OF ABDOMINAL WOUND, PLACEMENT OF A-CELL, PLACEMENT OF WOUND VAC;  Surgeon: 12-28-2003, DO;  Location: [facility];  Service: Plastics;  Laterality: N/A;  . INCISION AND DRAINAGE OF WOUND Right 06/22/2019   Procedure: Excision of abdominal wound with closure;  Surgeon: , DO;  Location: [facility];  Service: Plastics;  Laterality: Right;  Total case time is 1 hour  . LAPAROSCOPIC CHOLECYSTECTOMY  10-19-2002   dr Peggye Form  @[facility]   . LAPAROSCOPIC GASTRIC BANDING  09-25-2003  @[facility]    WITH HIATAL HERNIA REPAIR  . LAPAROSCOPIC LYSIS OF ADHESIONS N/A 01/24/2019   Procedure: LAPAROSCOPY WITH LYSIS OF ADHESIONS;  Surgeon: 10-21-2002, MD;  Location: [facility];  Service: General;  Laterality: N/A;  . LAPAROSCOPIC REPAIR AND REMOVAL OF GASTRIC BAND  02-06-2006  @[facility]   . LAPAROTOMY LYSIS ADHESIONS , UTERINE BIOPSY  01-31-2000   dr 02-02-2000  @[facility]   . OPEN WOUND DEBRIDEMENT ANTERIOR ABDOMINAL WALL , REMOVAL Rana Snare BODY'S  05-10-2001   dr Oswald Hillock  @[facility]   . PANNICULECTOMY N/A 01/24/2019   Procedure: PANNICULECTOMY;  Surgeon: Orson Slick, DO;  Location: [facility];  Service: Plastics;  Laterality: N/A;  . REPAIR RECURRENT  HIATAL HERNIA   04-23-2004  @[facility]   . TAKEDOWN RECURRENT HIATAL HERNIA FROM PREVIOUS MESH REPAIR OF DIAPHRAGM/ REPAIR HIATAL HERNIA AND THE GASTROPEXY  11/05/2004  . TONSILLECTOMY  age 60  . TOTAL ABDOMINAL HYSTERECTOMY W/  BILATERAL SALPINGOOPHORECTOMY  12-23-2005   dr soper   W/  BILATERAL URETEROLYSIS AND EXTENSIVE ABD. LYSIS ADHESIONS  . UMBILICAL HERNIA REPAIR  12/27/2009   LAPAROSCOPY TAKEDOWN INCARCERATED UMBILICAL HERNIA WITH REPAIR/ OPEN REPAIR COMPLEX LOWER INCISIONAL HERNIA  . VAGOTOMY  2006     OB History   No obstetric history on file.     Family History  Problem Relation Age of Onset  . Cancer Maternal Aunt        melanoma  . Diabetes Father   . Heart attack Father 72    Social History   Tobacco Use  . Smoking status: Current Every Day Smoker    Packs/day: 1.00    Years: 20.00    Pack years: 20.00    Types: Cigarettes    Last attempt to quit: 05/20/2015    Years since quitting: 5.2  . Smokeless tobacco: Never Used  . Tobacco comment: < 1 pack  Vaping Use  . Vaping Use: Never used  Substance Use Topics  . Alcohol use: Yes    Comment: socially  . Drug use: Never    Home Medications Prior to Admission medications   Medication Sig Start Date End Date Taking? Authorizing Provider  acetaminophen (TYLENOL) 500 MG tablet Take 1,000 mg by mouth every 4 (four) hours as needed for fever or headache.   Yes [provider]  albuterol (PROVENTIL HFA;VENTOLIN HFA) 108 (90 BASE) MCG/ACT inhaler Inhale 2 puffs into the lungs every 6 (six) hours as needed. For shortness of breath   Yes [provider]  ALPRAZolam (XANAX) 0.5 MG tablet Take 0.5 mg by mouth 3 (three) times daily as needed for anxiety.  05/06/18  Yes [provider]  buPROPion (WELLBUTRIN XL) 300 MG 24 hr tablet Take 300 mg by mouth daily.   Yes [provider]  cephALEXin (KEFLEX) 500 MG capsule Take 1 capsule (500 mg total) by mouth 4 (four) times daily. 08/02/20  Yes 51, MD  cetirizine (ZYRTEC) 10 MG tablet Take 10 mg by mouth daily as needed for allergies.    Yes [provider]  gabapentin (NEURONTIN) 300 MG capsule Take 300 mg by mouth 3 (three) times daily. 01/22/19   Yes [provider]  HYDROcodone-acetaminophen (NORCO/VICODIN) 5-325 MG tablet Take 1 tablet by mouth every 4 (four) hours as needed. 08/02/20  Yes 08/04/20, MD  [employer] Jacalyn Lefevre) 5 % SOLN Apply to eye.   Yes [provider]  mirtazapine (REMERON) 15 MG tablet Take 1 tablet (15 mg total) by mouth at bedtime. For mood control 03/27/17  Yes Money, 08/04/20, FNP  montelukast (SINGULAIR) 10 MG tablet Take 10 mg by mouth at bedtime.  02/04/17  Yes [provider]  olmesartan (BENICAR) 40 MG tablet Take 40 mg by mouth every morning.  04/13/17  Yes [provider]  pantoprazole (PROTONIX)  40 MG tablet Take 40 mg by mouth 2 (two) times daily.    Yes [provider]  promethazine (PHENERGAN) 25 MG tablet Take 25 mg by mouth every 8 (eight) hours as needed for nausea or vomiting.  12/31/17  Yes [provider]  senna (SENOKOT) 8.6 MG tablet Take 1 tablet by mouth daily as needed for constipation.   Yes [provider]  traZODone (DESYREL) 50 MG tablet Take 1 tablet (50 mg total) by mouth at bedtime as needed for sleep. For sleep Patient taking differently: Take 50 mg by mouth at bedtime. For sleep 03/27/17  Yes Money, Gerlene Burdockravis B, FNP  venlafaxine XR (EFFEXOR-XR) 150 MG 24 hr capsule Take 150 mg by mouth daily with breakfast.  05/04/18  Yes [provider]  azelastine (ASTELIN) 137 MCG/SPRAY nasal spray Place 2 sprays into both nostrils 2 (two) times daily as needed for rhinitis. Use in each nostril as directed    [provider]  collagenase (SANTYL) ointment Apply 1 application topically every other day. Apply to right and left abdominal wounds every other day. 03/15/19   Dozier-Lineberger, Mayah M, NP  polyethylene glycol (MIRALAX / GLYCOLAX) 17 g packet Take 17 g by mouth daily as needed for mild constipation. 09/25/18   Marguerita MerlesSheikh, Omair Latif, DO    Allergies    Adhesive [tape], Influenza vaccines, and  Lisinopril-hydrochlorothiazide  Review of Systems   Review of Systems  Gastrointestinal: Positive for abdominal pain.  All other systems reviewed and are negative.   Physical Exam Updated Vital Signs BP (!) 83/62 (BP Location: Left Arm)   Pulse 98   Temp 98.8 F (37.1 C) (Oral)   Resp 16   Ht 5\' 11"  (1.803 m)   Wt (!) 154.6 kg   SpO2 95%   BMI 47.53 kg/m   Physical Exam Vitals and nursing note reviewed.  Constitutional:      Appearance: She is well-developed.  HENT:     Head: Normocephalic and atraumatic.     Mouth/Throat:     Mouth: Mucous membranes are moist.  Eyes:     Extraocular Movements: Extraocular movements intact.     Pupils: Pupils are equal, round, and reactive to light.  Cardiovascular:     Rate and Rhythm: Regular rhythm. Tachycardia present.  Pulmonary:     Effort: Pulmonary effort is normal.     Breath sounds: Normal breath sounds.  Abdominal:     General: Abdomen is flat. Bowel sounds are normal.     Palpations: Abdomen is soft.     Tenderness: There is abdominal tenderness in the right lower quadrant.  Skin:    General: Skin is warm.     Capillary Refill: Capillary refill takes less than 2 seconds.  Neurological:     General: No focal deficit present.     Mental Status: She is alert and oriented to person, place, and time.  Psychiatric:        Mood and Affect: Mood normal.        Behavior: Behavior normal.     ED Results / Procedures / Treatments   Labs (all labs ordered are listed, but only abnormal results are displayed) Labs Reviewed  CBC WITH DIFFERENTIAL/PLATELET  COMPREHENSIVE METABOLIC PANEL  LIPASE, BLOOD  URINALYSIS, ROUTINE W REFLEX MICROSCOPIC    EKG None  Radiology CT ABDOMEN PELVIS W CONTRAST  Result Date: 08/02/2020 CLINICAL DATA:  Right lower quadrant pain EXAM: CT ABDOMEN AND PELVIS WITH CONTRAST TECHNIQUE: Multidetector CT imaging of the  abdomen and pelvis was performed using the standard protocol following bolus  administration of intravenous contrast. CONTRAST:  OMNIPAQUE IOHEXOL 300 MG/ML  SOLN COMPARISON:  2017 FINDINGS: Lower chest: Scattered small pulmonary nodules at the right lung base measuring up to 3 mm (series 4, image 8). Hiatal hernia containing fat and stomach. Hepatobiliary: No focal liver abnormality is seen. Status post cholecystectomy. No biliary dilatation. Pancreas: Unremarkable. Spleen: Unremarkable. Adrenals/Urinary Tract: Adrenals, kidneys, and bladder are unremarkable. Stomach/Bowel: Hiatal hernia. Stomach otherwise unremarkable. Bowel is normal in caliber. Extensive colonic diverticulosis. Normal appendix. Vascular/Lymphatic: Minimal aortic atherosclerosis. No enlarged lymph nodes. Reproductive: Status post hysterectomy. No adnexal masses. Other: No ascites. Mild infiltration of the ventral abdominal wall fat in the periumbilical and infraumbilical region. Musculoskeletal: Degenerative changes of the included spine. No acute osseous abnormality. IMPRESSION: Mild infiltration of ventral abdominal fat in the periumbilical and infraumbilical region. Nonspecific but could reflect mild inflammation. Scattered small pulmonary nodules at the right lung base measuring up to 3 mm. May be infectious/inflammatory. Moderate hiatal hernia containing stomach and fat. Colonic diverticulosis. Electronically Signed   By: Guadlupe Spanish M.D.   On: 08/02/2020 19:04    Procedures Procedures   Medications Ordered in ED Medications  morphine 4 MG/ML injection 4 mg (has no administration in time range)  morphine 4 MG/ML injection 4 mg (4 mg Intravenous Given 08/02/20 1726)  ondansetron (ZOFRAN) injection 4 mg (4 mg Intravenous Given 08/02/20 1726)  0.9 %  sodium chloride infusion ( Intravenous New Bag/Given 08/02/20 1702)  iohexol (OMNIPAQUE) 300 MG/ML solution 100 mL (100 mLs Intravenous Contrast Given 08/02/20 1823)    ED Course  I have reviewed the triage vital signs and the nursing notes.  Pertinent  labs & imaging results that were available during my care of the patient were reviewed by me and considered in my medical decision making (see chart for details).    MDM Rules/Calculators/A&P                          Pt's CT scan showed some inflammation in the abdominal fat.  She also has a hiatal hernia (known to pt).  No other hernia. Labs unremarkable.  Pt will be started on keflex and d/c with lortab.  She is stable for d/c.  Return if worse. Final Clinical Impression(s) / ED Diagnoses Final diagnoses:  Panniculitis  Generalized abdominal pain    Rx / DC Orders ED Discharge Orders         Ordered    cephALEXin (KEFLEX) 500 MG capsule  4 times daily        08/02/20 2024    HYDROcodone-acetaminophen (NORCO/VICODIN) 5-325 MG tablet  Every 4 hours PRN        08/02/20 2024           Jacalyn Lefevre, MD 08/02/20 2039

## 2020-08-02 NOTE — ED Triage Notes (Addendum)
Right lower quadrant pain since 1100.  Throbbing pain starts from RLQ radiates to right groin with burning sensation.  Denies nausea or vomiting.

## 2020-08-02 NOTE — ED Notes (Signed)
Pt ambulated to restroom with steady gait to restroom

## 2020-08-02 NOTE — ED Notes (Signed)
Patient transported to CT 

## 2020-08-18 ENCOUNTER — Encounter (HOSPITAL_COMMUNITY): Payer: Self-pay

## 2020-08-18 ENCOUNTER — Other Ambulatory Visit: Payer: Self-pay

## 2020-08-18 ENCOUNTER — Emergency Department (HOSPITAL_COMMUNITY): Payer: Medicare Other

## 2020-08-18 ENCOUNTER — Emergency Department (HOSPITAL_COMMUNITY)
Admission: EM | Admit: 2020-08-18 | Discharge: 2020-08-19 | Disposition: A | Discharge status: Home / Self Care | Payer: Medicare Other | Attending: Emergency Medicine | Admitting: Emergency Medicine

## 2020-08-18 DIAGNOSIS — Z79899 Other long term (current) drug therapy: Secondary | ICD-10-CM | POA: Insufficient documentation

## 2020-08-18 DIAGNOSIS — J45909 Unspecified asthma, uncomplicated: Secondary | ICD-10-CM | POA: Diagnosis not present

## 2020-08-18 DIAGNOSIS — I10 Essential (primary) hypertension: Secondary | ICD-10-CM | POA: Insufficient documentation

## 2020-08-18 DIAGNOSIS — R202 Paresthesia of skin: Secondary | ICD-10-CM | POA: Diagnosis not present

## 2020-08-18 DIAGNOSIS — F17210 Nicotine dependence, cigarettes, uncomplicated: Secondary | ICD-10-CM | POA: Diagnosis not present

## 2020-08-18 DIAGNOSIS — R55 Syncope and collapse: Secondary | ICD-10-CM | POA: Insufficient documentation

## 2020-08-18 DIAGNOSIS — Z20822 Contact with and (suspected) exposure to covid-19: Secondary | ICD-10-CM | POA: Diagnosis not present

## 2020-08-18 DIAGNOSIS — R531 Weakness: Secondary | ICD-10-CM | POA: Insufficient documentation

## 2020-08-18 LAB — CBC WITH DIFFERENTIAL/PLATELET
Abs Immature Granulocytes: 0.04 10*3/uL (ref 0.00–0.07)
Basophils Absolute: 0.1 10*3/uL (ref 0.0–0.1)
Basophils Relative: 1 %
Eosinophils Absolute: 0.3 10*3/uL (ref 0.0–0.5)
Eosinophils Relative: 3 %
HCT: 42 % (ref 36.0–46.0)
Hemoglobin: 13.5 g/dL (ref 12.0–15.0)
Immature Granulocytes: 0 %
Lymphocytes Relative: 28 %
Lymphs Abs: 2.9 10*3/uL (ref 0.7–4.0)
MCH: 32 pg (ref 26.0–34.0)
MCHC: 32.1 g/dL (ref 30.0–36.0)
MCV: 99.5 fL (ref 80.0–100.0)
Monocytes Absolute: 0.6 10*3/uL (ref 0.1–1.0)
Monocytes Relative: 6 %
Neutro Abs: 6.3 10*3/uL (ref 1.7–7.7)
Neutrophils Relative %: 62 %
Platelets: 234 10*3/uL (ref 150–400)
RBC: 4.22 MIL/uL (ref 3.87–5.11)
RDW: 14.1 % (ref 11.5–15.5)
WBC: 10.2 10*3/uL (ref 4.0–10.5)
nRBC: 0 % (ref 0.0–0.2)

## 2020-08-18 LAB — RESP PANEL BY RT-PCR (FLU A&B, COVID) ARPGX2
Influenza A by PCR: NEGATIVE
Influenza B by PCR: NEGATIVE
SARS Coronavirus 2 by RT PCR: NEGATIVE

## 2020-08-18 LAB — TROPONIN I (HIGH SENSITIVITY)
Troponin I (High Sensitivity): <2 ng/L (ref <18)
Troponin I (High Sensitivity): <2 ng/L (ref <18)

## 2020-08-18 LAB — COMPREHENSIVE METABOLIC PANEL
ALT: 24 U/L (ref 0–44)
AST: 29 U/L (ref 15–41)
Albumin: 3.4 g/dL — ABNORMAL LOW (ref 3.5–5.0)
Alkaline Phosphatase: 65 U/L (ref 38–126)
Anion gap: 7 (ref 5–15)
BUN: 23 mg/dL — ABNORMAL HIGH (ref 6–20)
CO2: 24 mmol/L (ref 22–32)
Calcium: 8.5 mg/dL — ABNORMAL LOW (ref 8.9–10.3)
Chloride: 106 mmol/L (ref 98–111)
Creatinine, Ser: 1.15 mg/dL — ABNORMAL HIGH (ref 0.44–1.00)
GFR, Estimated: 58 mL/min — ABNORMAL LOW (ref >60)
Glucose, Bld: 85 mg/dL (ref 70–99)
Potassium: 3.7 mmol/L (ref 3.5–5.1)
Sodium: 137 mmol/L (ref 135–145)
Total Bilirubin: 0.6 mg/dL (ref 0.3–1.2)
Total Protein: 6.7 g/dL (ref 6.5–8.1)

## 2020-08-18 LAB — LACTIC ACID, PLASMA: Lactic Acid, Venous: 1.2 mmol/L (ref 0.5–1.9)

## 2020-08-18 MED ORDER — SODIUM CHLORIDE 0.9 % IV BOLUS: 1000.0000 mL | Freq: Once | INTRAVENOUS | Status: DC

## 2020-08-18 MED ORDER — SODIUM CHLORIDE 0.9 % IV BOLUS
1000.0000 mL | Freq: Once | INTRAVENOUS | Status: AC
  Administered 2020-08-18: 1000 mL via INTRAVENOUS

## 2020-08-18 MED ORDER — SODIUM CHLORIDE 0.9 % IV SOLN: INTRAVENOUS | Status: DC

## 2020-08-18 NOTE — ED Triage Notes (Signed)
Pt states that she was at a friends house when he started to feel a "tingling in the left side of head". Family states pt had what appeared to be a syncopal episode. Pt states she now feel weak. Pt alert x4.

## 2020-08-18 NOTE — ED Notes (Signed)
Patient transported to CT 

## 2020-08-18 NOTE — ED Provider Notes (Signed)
[facility] EMERGENCY DEPARTMENT Provider Note   CSN: [id number] Arrival date & time: 08/18/20  1902     History No chief complaint on file.   [name] is a 50 y.o. female.  Patient brought in by EMS.  Patient was at a friend's house for a birthday party.  When she started to feel a tingling sensation on the right side of her head and then kind of went over to the left.  Then she had a syncopal episode.  Patient feels weak.  No one described any seizure-like activity.  Patient without a history of syncope.  Patient states she was feeling fine earlier today and yesterday.  Past medical history is significant for hypertension endometriosis asthma anxiety major depressive disorder.  She denied any chest pain or any shortness of breath.  Any abdominal pain any nausea or vomiting.  On arrival patient states she is just sort of felt weak all over.  No speech problems no visual problems.  No headache.  No fever.  She denies any numbness or sensory loss to the upper extremities or lower extremities.  And the symptoms that she felt in the head have resolved.        Past Medical History:  Diagnosis Date  . Anxiety   . Arthritis    joints  . Asthma   . Endometriosis   . Fat necrosis of breast    right  . GERD (gastroesophageal reflux disease)   . History of hiatal hernia    s/p repair few times  . History of suicide attempt 03/2017  . Hypertension   . MDD (major depressive disorder)   . Panniculitis   . Pneumonia 10/2018   in hospital with pneumonia  . PONV (postoperative nausea and vomiting)   . Psoriasis    treated with Humeria    Patient Active Problem List   Diagnosis Date Noted  . S/P panniculectomy 07/19/2019  . Wound dehiscence 02/06/2019  . Pneumonia 09/23/2018  . Depression with anxiety 09/23/2018  . Psoriasis 09/23/2018  . Tachyarrhythmia   . Ventral hernia 06/09/2018  . Breast, fat necrosis 05/11/2018  . Posttraumatic stress disorder 04/05/2018  .  Panniculitis 03/10/2018  . Major depressive disorder, single episode, unspecified 04/02/2017  . MDD (major depressive disorder), recurrent episode, severe (HCC) 03/24/2017  . Symptomatic mammary hypertrophy 01/09/2016  . Nicotine dependence 12/25/2015    Past Surgical History:  Procedure Laterality Date  . ACHILLES TENDON REPAIR Left 01-11-2015  @[facility]   . BREAST CYST EXCISION Right 01/24/2019   Procedure: EXCISIN OF RIGHT BREAST FAT NECROSIS;  Surgeon: 01/26/2019, DO;  Location: [facility];  Service: Plastics;  Laterality: Right;  . BREAST REDUCTION SURGERY Bilateral 01/09/2016   Procedure: MAMMARY REDUCTION  (BREAST) BILATERAL;  Surgeon: 03/10/2016, DO;  Location: [facility];  Service: Plastics;  Laterality: Bilateral;  . CARPOMETACARPAL (CMC) FUSION OF THUMB Right 05-04-2015   @[facility]   . COMBINED HYSTEROSCOPY DIAGNOSTIC / D&C  06-23-2003     dr @[facility]   . D & C HYSTERSCOPY /  DX LAPAROSCOPY CONVERSION LAPAROTOMY LYSIS ADHESIONS WITH RIGHT SALPINECTOMY  12-26-2003    dr Ambrose Mantle  @[facility]   . DEBRIDEMENT AND CLOSURE WOUND N/A 02/07/2019   Procedure: DEBRIDEMENT OF ABDOMINAL WOUND, PLACEMENT OF A-CELL, PLACEMENT OF WOUND VAC;  Surgeon: 12-28-2003, DO;  Location: [facility];  Service: Plastics;  Laterality: N/A;  . INCISION AND DRAINAGE OF WOUND Right 06/22/2019   Procedure: Excision of abdominal wound with closure;  Surgeon: Peggye Formillingham, Claire S, DO;  Location: [facility];  Service: Plastics;  Laterality: Right;  Total case time is 1 hour  . LAPAROSCOPIC CHOLECYSTECTOMY  10-19-2002   dr Orson Slickbowman  @[facility]   . LAPAROSCOPIC GASTRIC BANDING  09-25-2003  @[facility]    WITH HIATAL HERNIA REPAIR  . LAPAROSCOPIC LYSIS OF ADHESIONS N/A 01/24/2019   Procedure: LAPAROSCOPY WITH LYSIS OF ADHESIONS;  Surgeon: Luretha MurphyMartin, Matthew, MD;  Location: [facility];  Service: General;  Laterality: N/A;  . LAPAROSCOPIC REPAIR AND REMOVAL OF GASTRIC BAND  02-06-2006  @[facility]   . LAPAROTOMY LYSIS  ADHESIONS , UTERINE BIOPSY  01-31-2000   dr Rana Snarelowe  @[facility]   . OPEN WOUND DEBRIDEMENT ANTERIOR ABDOMINAL WALL , REMOVAL Oswald HillockFORGEIN BODY'S  05-10-2001   dr Orson Slickbowman  @[facility]   . PANNICULECTOMY N/A 01/24/2019   Procedure: PANNICULECTOMY;  Surgeon: Peggye Formillingham, Claire S, DO;  Location: [facility];  Service: Plastics;  Laterality: N/A;  . REPAIR RECURRENT  HIATAL HERNIA   04-23-2004  @[facility]   . TAKEDOWN RECURRENT HIATAL HERNIA FROM PREVIOUS MESH REPAIR OF DIAPHRAGM/ REPAIR HIATAL HERNIA AND THE GASTROPEXY  11/05/2004  . TONSILLECTOMY  age 89  . TOTAL ABDOMINAL HYSTERECTOMY W/ BILATERAL SALPINGOOPHORECTOMY  12-23-2005   dr soper   W/  BILATERAL URETEROLYSIS AND EXTENSIVE ABD. LYSIS ADHESIONS  . UMBILICAL HERNIA REPAIR  12/27/2009   LAPAROSCOPY TAKEDOWN INCARCERATED UMBILICAL HERNIA WITH REPAIR/ OPEN REPAIR COMPLEX LOWER INCISIONAL HERNIA  . VAGOTOMY  2006     OB History   No obstetric history on file.     Family History  Problem Relation Age of Onset  . Cancer Maternal Aunt        melanoma  . Diabetes Father   . Heart attack Father [id number]    Social History   Tobacco Use  . Smoking status: Current Every Day Smoker    Packs/day: 1.00    Years: 20.00    Pack years: 20.00    Types: Cigarettes    Last attempt to quit: 05/20/2015    Years since quitting: 5.2  . Smokeless tobacco: Never Used  . Tobacco comment: < 1 pack  Vaping Use  . Vaping Use: Never used  Substance Use Topics  . Alcohol use: Yes    Comment: socially  . Drug use: Never    Home Medications Prior to Admission medications   Medication Sig Start Date End Date Taking? Authorizing Provider  acetaminophen (TYLENOL) 500 MG tablet Take 1,000 mg by mouth every 4 (four) hours as needed for fever or headache.    [provider]  albuterol (PROVENTIL HFA;VENTOLIN HFA) 108 (90 BASE) MCG/ACT inhaler Inhale 2 puffs into the lungs every 6 (six) hours as needed. For shortness of breath    [provider]  ALPRAZolam (XANAX) 0.5 MG tablet  Take 0.5 mg by mouth 3 (three) times daily as needed for anxiety.  05/06/18   [provider]  azelastine (ASTELIN) 137 MCG/SPRAY nasal spray Place 2 sprays into both nostrils 2 (two) times daily as needed for rhinitis. Use in each nostril as directed    [provider]  buPROPion (WELLBUTRIN XL) 300 MG 24 hr tablet Take 300 mg by mouth daily.    [provider]  cephALEXin (KEFLEX) 500 MG capsule Take 1 capsule (500 mg total) by mouth 4 (four) times daily. 08/02/20   Jacalyn LefevreHaviland, Julie, MD  cetirizine (ZYRTEC) 10 MG tablet Take 10 mg by mouth daily as needed for allergies.     [provider]  collagenase Melburn Popper(SANTYL)  ointment Apply 1 application topically every other day. Apply to right and left abdominal wounds every other day. 03/15/19   Dozier-Lineberger, Mayah M, NP  gabapentin (NEURONTIN) 300 MG capsule Take 300 mg by mouth 3 (three) times daily. 01/22/19   [provider]  HYDROcodone-acetaminophen (NORCO/VICODIN) 5-325 MG tablet Take 1 tablet by mouth every 4 (four) hours as needed. 08/02/20   Jacalyn Lefevre, MD  Lifitegrast Benay Spice) 5 % SOLN Apply to eye.    [provider]  mirtazapine (REMERON) 15 MG tablet Take 1 tablet (15 mg total) by mouth at bedtime. For mood control 03/27/17   Money, Feliz Beam B, FNP  montelukast (SINGULAIR) 10 MG tablet Take 10 mg by mouth at bedtime.  02/04/17   [provider]  olmesartan (BENICAR) 40 MG tablet Take 40 mg by mouth every morning.  04/13/17   [provider]  pantoprazole (PROTONIX) 40 MG tablet Take 40 mg by mouth 2 (two) times daily.     [provider]  polyethylene glycol (MIRALAX / GLYCOLAX) 17 g packet Take 17 g by mouth daily as needed for mild constipation. 09/25/18   Marguerita Merles Latif, DO  promethazine (PHENERGAN) 25 MG tablet Take 25 mg by mouth every 8 (eight) hours as needed for nausea or vomiting.  12/31/17   [provider]  senna (SENOKOT) 8.6 MG tablet Take 1  tablet by mouth daily as needed for constipation.    [provider]  traZODone (DESYREL) 50 MG tablet Take 1 tablet (50 mg total) by mouth at bedtime as needed for sleep. For sleep Patient taking differently: Take 50 mg by mouth at bedtime. For sleep 03/27/17   Money, Gerlene Burdock, FNP  venlafaxine XR (EFFEXOR-XR) 150 MG 24 hr capsule Take 150 mg by mouth daily with breakfast.  05/04/18   [provider]    Allergies    Adhesive [tape], Influenza vaccines, and Lisinopril-hydrochlorothiazide  Review of Systems   Review of Systems  Constitutional: Positive for fatigue. Negative for chills and fever.  HENT: Negative for rhinorrhea and sore throat.   Eyes: Negative for visual disturbance.  Respiratory: Negative for cough and shortness of breath.   Cardiovascular: Negative for chest pain and leg swelling.  Gastrointestinal: Negative for abdominal pain, diarrhea, nausea and vomiting.  Genitourinary: Negative for dysuria.  Musculoskeletal: Negative for back pain and neck pain.  Skin: Negative for rash.  Neurological: Positive for syncope and numbness. Negative for dizziness, light-headedness and headaches.  Hematological: Does not bruise/bleed easily.  Psychiatric/Behavioral: Negative for confusion.    Physical Exam Updated Vital Signs There were no vitals taken for this visit.  Physical Exam Vitals and nursing note reviewed.  Constitutional:      General: She is not in acute distress.    Appearance: Normal appearance. She is well-developed. She is obese.  HENT:     Head: Normocephalic and atraumatic.  Eyes:     Extraocular Movements: Extraocular movements intact.     Conjunctiva/sclera: Conjunctivae normal.     Pupils: Pupils are equal, round, and reactive to light.  Cardiovascular:     Rate and Rhythm: Normal rate and regular rhythm.     Heart sounds: No murmur heard.   Pulmonary:     Effort: Pulmonary effort is normal. No respiratory distress.     Breath  sounds: Normal breath sounds.  Abdominal:     Palpations: Abdomen is soft.     Tenderness: There is no abdominal tenderness.  Musculoskeletal:  General: Normal range of motion.     Cervical back: Normal range of motion and neck supple.  Skin:    General: Skin is warm and dry.     Capillary Refill: Capillary refill takes less than 2 seconds.  Neurological:     General: No focal deficit present.     Mental Status: She is alert and oriented to person, place, and time.     Cranial Nerves: No cranial nerve deficit.     Sensory: No sensory deficit.     Motor: No weakness.     Comments: Neuro exam without any focal deficit     ED Results / Procedures / Treatments   Labs (all labs ordered are listed, but only abnormal results are displayed) Labs Reviewed - No data to display  EKG None  Radiology No results found.  Procedures Procedures   Medications Ordered in ED Medications - No data to display  ED Course  I have reviewed the triage vital signs and the nursing notes.  Pertinent labs & imaging results that were available during my care of the patient were reviewed by me and considered in my medical decision making (see chart for details).    MDM Rules/Calculators/A&P                          Tensive work-up here in the emergency department without any acute findings.  Troponins normal.  Lactic acid normal.  Labs without significant abnormalities.  Head CT negative chest x-ray negative.  Cardiac monitoring without any arrhythmias.  Do not think that this was a cardiac syncope.  Because of the symptoms she had before hand.  Patient did have some waxing and waning blood pressures and was initially a little tachycardic.  But now heart rates down in the 80s.  Blood pressures systolics are in the 100 range.  Occasionally the documented 1 around 90 but not sure if they were accurate certainly the trend is for normal blood pressure.  Patient stable for discharge home follow-up  with primary care doctor.  Patient will return for any worse symptoms or for recurrent passing out. Final Clinical Impression(s) / ED Diagnoses Final diagnoses:  None    Rx / DC Orders ED Discharge Orders    None       Vanetta Mulders, MD 08/19/20 0105

## 2020-08-19 NOTE — Discharge Instructions (Signed)
Return for any recurrent passing out.  Or for any new or worse symptoms.  Today's work-up without any acute findings head CT negative chest x-ray negative COVID testing negative labs were normal.  They can appointment follow-up with your primary care doctor for follow-up.

## 2020-08-23 LAB — CULTURE, BLOOD (ROUTINE X 2)
Culture: NO GROWTH
Culture: NO GROWTH
Special Requests: ADEQUATE
Special Requests: ADEQUATE

## 2021-03-15 ENCOUNTER — Emergency Department (HOSPITAL_BASED_OUTPATIENT_CLINIC_OR_DEPARTMENT_OTHER): Payer: Medicare Other

## 2021-03-15 ENCOUNTER — Encounter (HOSPITAL_BASED_OUTPATIENT_CLINIC_OR_DEPARTMENT_OTHER): Payer: Self-pay | Admitting: Emergency Medicine

## 2021-03-15 ENCOUNTER — Emergency Department (HOSPITAL_BASED_OUTPATIENT_CLINIC_OR_DEPARTMENT_OTHER)
Admission: EM | Admit: 2021-03-15 | Discharge: 2021-03-15 | Disposition: A | Discharge status: Home / Self Care | Payer: Medicare Other | Attending: Emergency Medicine | Admitting: Emergency Medicine

## 2021-03-15 DIAGNOSIS — Y9389 Activity, other specified: Secondary | ICD-10-CM | POA: Diagnosis not present

## 2021-03-15 DIAGNOSIS — Z79899 Other long term (current) drug therapy: Secondary | ICD-10-CM | POA: Insufficient documentation

## 2021-03-15 DIAGNOSIS — F17210 Nicotine dependence, cigarettes, uncomplicated: Secondary | ICD-10-CM | POA: Diagnosis not present

## 2021-03-15 DIAGNOSIS — J45909 Unspecified asthma, uncomplicated: Secondary | ICD-10-CM | POA: Diagnosis not present

## 2021-03-15 DIAGNOSIS — S8991XA Unspecified injury of right lower leg, initial encounter: Secondary | ICD-10-CM | POA: Insufficient documentation

## 2021-03-15 DIAGNOSIS — S5001XA Contusion of right elbow, initial encounter: Secondary | ICD-10-CM | POA: Diagnosis not present

## 2021-03-15 DIAGNOSIS — S0990XA Unspecified injury of head, initial encounter: Secondary | ICD-10-CM | POA: Insufficient documentation

## 2021-03-15 DIAGNOSIS — I10 Essential (primary) hypertension: Secondary | ICD-10-CM | POA: Insufficient documentation

## 2021-03-15 DIAGNOSIS — E669 Obesity, unspecified: Secondary | ICD-10-CM | POA: Diagnosis not present

## 2021-03-15 DIAGNOSIS — S199XXA Unspecified injury of neck, initial encounter: Secondary | ICD-10-CM | POA: Diagnosis present

## 2021-03-15 DIAGNOSIS — Y92410 Unspecified street and highway as the place of occurrence of the external cause: Secondary | ICD-10-CM | POA: Diagnosis not present

## 2021-03-15 DIAGNOSIS — S161XXA Strain of muscle, fascia and tendon at neck level, initial encounter: Secondary | ICD-10-CM | POA: Insufficient documentation

## 2021-03-15 MED ORDER — METHOCARBAMOL 500 MG PO TABS: 500.0000 mg | ORAL_TABLET | Freq: Three times a day (TID) | ORAL | 0 refills | Status: DC | PRN

## 2021-03-15 NOTE — ED Notes (Signed)
Pt d/c home per MD order. Discharge summary reviewed with pt, pt verbalizes understanding. Ambulatory off unit. No s/s of acute distress noted at discharge.  °
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
## 2021-04-15 ENCOUNTER — Other Ambulatory Visit: Payer: Self-pay

## 2021-04-15 ENCOUNTER — Emergency Department (HOSPITAL_BASED_OUTPATIENT_CLINIC_OR_DEPARTMENT_OTHER)
Admission: EM | Admit: 2021-04-15 | Discharge: 2021-04-15 | Disposition: A | Discharge status: Home / Self Care | Payer: 59 | Attending: Emergency Medicine | Admitting: Emergency Medicine

## 2021-04-15 ENCOUNTER — Emergency Department (HOSPITAL_BASED_OUTPATIENT_CLINIC_OR_DEPARTMENT_OTHER): Payer: 59 | Admitting: Radiology

## 2021-04-15 ENCOUNTER — Encounter (HOSPITAL_BASED_OUTPATIENT_CLINIC_OR_DEPARTMENT_OTHER): Payer: Self-pay

## 2021-04-15 DIAGNOSIS — J40 Bronchitis, not specified as acute or chronic: Secondary | ICD-10-CM

## 2021-04-15 DIAGNOSIS — Z20822 Contact with and (suspected) exposure to covid-19: Secondary | ICD-10-CM | POA: Insufficient documentation

## 2021-04-15 DIAGNOSIS — I10 Essential (primary) hypertension: Secondary | ICD-10-CM | POA: Insufficient documentation

## 2021-04-15 DIAGNOSIS — R0602 Shortness of breath: Secondary | ICD-10-CM | POA: Diagnosis present

## 2021-04-15 DIAGNOSIS — J45909 Unspecified asthma, uncomplicated: Secondary | ICD-10-CM | POA: Insufficient documentation

## 2021-04-15 DIAGNOSIS — F17200 Nicotine dependence, unspecified, uncomplicated: Secondary | ICD-10-CM | POA: Diagnosis not present

## 2021-04-15 LAB — CBC WITH DIFFERENTIAL/PLATELET
Abs Immature Granulocytes: 0.04 10*3/uL (ref 0.00–0.07)
Basophils Absolute: 0.1 10*3/uL (ref 0.0–0.1)
Basophils Relative: 1 %
Eosinophils Absolute: 0.1 10*3/uL (ref 0.0–0.5)
Eosinophils Relative: 1 %
HCT: 46.3 % — ABNORMAL HIGH (ref 36.0–46.0)
Hemoglobin: 14.8 g/dL (ref 12.0–15.0)
Immature Granulocytes: 0 %
Lymphocytes Relative: 23 %
Lymphs Abs: 2.8 10*3/uL (ref 0.7–4.0)
MCH: 29.8 pg (ref 26.0–34.0)
MCHC: 32 g/dL (ref 30.0–36.0)
MCV: 93.3 fL (ref 80.0–100.0)
Monocytes Absolute: 0.6 10*3/uL (ref 0.1–1.0)
Monocytes Relative: 5 %
Neutro Abs: 8.5 10*3/uL — ABNORMAL HIGH (ref 1.7–7.7)
Neutrophils Relative %: 70 %
Platelets: 245 10*3/uL (ref 150–400)
RBC: 4.96 MIL/uL (ref 3.87–5.11)
RDW: 14.5 % (ref 11.5–15.5)
WBC: 12.1 10*3/uL — ABNORMAL HIGH (ref 4.0–10.5)
nRBC: 0 % (ref 0.0–0.2)

## 2021-04-15 LAB — COMPREHENSIVE METABOLIC PANEL
ALT: 20 U/L (ref 0–44)
AST: 19 U/L (ref 15–41)
Albumin: 3.9 g/dL (ref 3.5–5.0)
Alkaline Phosphatase: 62 U/L (ref 38–126)
Anion gap: 8 (ref 5–15)
BUN: 17 mg/dL (ref 6–20)
CO2: 30 mmol/L (ref 22–32)
Calcium: 9.5 mg/dL (ref 8.9–10.3)
Chloride: 99 mmol/L (ref 98–111)
Creatinine, Ser: 0.96 mg/dL (ref 0.44–1.00)
GFR, Estimated: >60 mL/min (ref >60)
Glucose, Bld: 104 mg/dL — ABNORMAL HIGH (ref 70–99)
Potassium: 4.2 mmol/L (ref 3.5–5.1)
Sodium: 137 mmol/L (ref 135–145)
Total Bilirubin: 0.3 mg/dL (ref 0.3–1.2)
Total Protein: 7.6 g/dL (ref 6.5–8.1)

## 2021-04-15 LAB — RESP PANEL BY RT-PCR (FLU A&B, COVID) ARPGX2
Influenza A by PCR: NEGATIVE
Influenza B by PCR: NEGATIVE
SARS Coronavirus 2 by RT PCR: NEGATIVE

## 2021-04-15 LAB — BRAIN NATRIURETIC PEPTIDE: B Natriuretic Peptide: 21.3 pg/mL (ref 0.0–100.0)

## 2021-04-15 LAB — TROPONIN I (HIGH SENSITIVITY): Troponin I (High Sensitivity): <2 ng/L (ref <18)

## 2021-04-15 MED ORDER — IPRATROPIUM-ALBUTEROL 0.5-2.5 (3) MG/3ML IN SOLN
3.0000 mL | Freq: Once | RESPIRATORY_TRACT | Status: AC
  Administered 2021-04-15: 3 mL via RESPIRATORY_TRACT
  Filled 2021-04-15: qty 3

## 2021-04-15 MED ORDER — SODIUM CHLORIDE 0.9 % IV BOLUS
500.0000 mL | Freq: Once | INTRAVENOUS | Status: AC
  Administered 2021-04-15: 500 mL via INTRAVENOUS

## 2021-04-15 MED ORDER — BENZONATATE 100 MG PO CAPS: 100.0000 mg | ORAL_CAPSULE | Freq: Three times a day (TID) | ORAL | 0 refills | Status: DC | PRN

## 2021-04-15 MED ORDER — DOXYCYCLINE HYCLATE 100 MG PO CAPS: 100.0000 mg | ORAL_CAPSULE | Freq: Two times a day (BID) | ORAL | 0 refills | Status: AC

## 2021-04-15 MED ORDER — ALBUTEROL SULFATE HFA 108 (90 BASE) MCG/ACT IN AERS: 1.0000 | INHALATION_SPRAY | Freq: Four times a day (QID) | RESPIRATORY_TRACT | 0 refills | Status: AC | PRN

## 2021-04-15 NOTE — ED Triage Notes (Signed)
Patient here POV from Home with SOB.  Patient has been having SOB as well as Subjective Fevers, Congestion, Headaches, Productive Cough for 3-4 days.  NAD Noted during Triage. A&Ox4. GCS 15. Ambulatory.

## 2021-04-15 NOTE — Discharge Instructions (Signed)
You are seen in the emerge department today with cough, congestion, shortness of breath.  Your heart labs and chest x-ray look normal.  You are not showing signs of a heart attack or large pneumonia.  I will start you on antibiotics along with albuterol inhaler and cough medication.  Please continue to drink plenty of fluids and follow closely with your primary care doctor in the coming week.  Your COVID and flu tests were negative here today.   Return to the emergency department any new or suddenly worsening symptoms such as chest pain, trouble breathing, lightheadedness.

## 2021-04-15 NOTE — ED Provider Notes (Signed)
Emergency Department Provider Note   I have reviewed the triage vital signs and the nursing notes.   HISTORY  Chief Complaint Shortness of Breath   HPI [name] is a 51 y.o. female presents to the emergency department for evaluation of shortness of breath.  She notes a prior history of asthma and bronchitis and that this feels similar to those episodes in the past.  She has had symptoms for the last 3 to 4 days.  She is felt short of breath but also had subjective fever along with congestion and headache.  She notes chest pain only with deep coughing, which she is doing frequently.  She not having pleuritic pain.  She has tried albuterol at home with no relief in symptoms and so presents for evaluation. No leg swelling/pain, unilateral or otherwise.    Past Medical History:  Diagnosis Date   Anxiety    Arthritis    joints   Asthma    Endometriosis    Fat necrosis of breast    right   GERD (gastroesophageal reflux disease)    History of hiatal hernia    s/p repair few times   History of suicide attempt 03/2017   Hypertension    MDD (major depressive disorder)    Panniculitis    Pneumonia 10/2018   in hospital with pneumonia   PONV (postoperative nausea and vomiting)    Psoriasis    treated with Humeria    Review of Systems  Constitutional: Positive subjective fever/chills Eyes: No visual changes. ENT: Mild sore throat with congestion and rhinorrhea.  Cardiovascular: Positive CP only with coughing.  Respiratory: Positive shortness of breath and coughing.  Gastrointestinal: No abdominal pain.  No nausea, no vomiting.  No diarrhea.  No constipation. Genitourinary: Negative for dysuria. Musculoskeletal: Negative for back pain. Skin: Negative for rash. Neurological: Negative for focal weakness or numbness. Positive HA.   10-point ROS otherwise negative.  ____________________________________________   PHYSICAL EXAM:  VITAL SIGNS: ED Triage Vitals  Enc  Vitals Group     BP 04/15/21 1137 128/83     Pulse Rate 04/15/21 1137 (!) 107     Resp 04/15/21 1137 18     Temp 04/15/21 1137 (!) 97.5 F (36.4 C)     Temp src --      SpO2 04/15/21 1137 96 %     Weight 04/15/21 1138 (!) 339 lb 15.2 oz (154.2 kg)     Height 04/15/21 1138 5\' 11"  (1.803 m)    Constitutional: Alert and oriented. Well appearing and in no acute distress. Eyes: Conjunctivae are normal.  Head: Atraumatic. Nose: Positive congestion/rhinnorhea. Mouth/Throat: Mucous membranes are moist.  Oropharynx non-erythematous. Neck: No stridor.  Cardiovascular: Mild tachycardia on arrival resolved on my exam. Good peripheral circulation. Grossly normal heart sounds.   Respiratory: Normal respiratory effort.  No retractions. Lungs with faint end-expiratory wheezing bilaterally with associated rhonchi. No unilateral findings.  Gastrointestinal: Soft and nontender. No distention.  Musculoskeletal: No lower extremity tenderness nor edema. No gross deformities of extremities. Neurologic:  Normal speech and language. No gross focal neurologic deficits are appreciated.  Skin:  Skin is warm, dry and intact. No rash noted.  ____________________________________________   LABS (all labs ordered are listed, but only abnormal results are displayed)  Labs Reviewed  COMPREHENSIVE METABOLIC PANEL - Abnormal; Notable for the following components:      Result Value   Glucose, Bld 104 (*)    All other components within normal limits  CBC  WITH DIFFERENTIAL/PLATELET - Abnormal; Notable for the following components:   WBC 12.1 (*)    HCT 46.3 (*)    Neutro Abs 8.5 (*)    All other components within normal limits  RESP PANEL BY RT-PCR (FLU A&B, COVID) ARPGX2  BRAIN NATRIURETIC PEPTIDE  TROPONIN I (HIGH SENSITIVITY)   ____________________________________________  EKG   EKG Interpretation  Date/Time:  Monday April 15 2021 11:40:08 EST Ventricular Rate:  106 PR Interval:  140 QRS  Duration: 78 QT Interval:  320 QTC Calculation: 425 R Axis:   65 Text Interpretation: Sinus tachycardia Cannot rule out Inferior infarct , age undetermined Abnormal ECG When compared with ECG of 18-Aug-2020 19:17, PREVIOUS ECG IS PRESENT T wave inversion now present in lead III, otherwise similar to prior. Confirmed by Alona Bene [phone]) on 04/15/2021 12:05:52 PM        ____________________________________________  RADIOLOGY  DG Chest 2 View  Result Date: 04/15/2021 CLINICAL DATA:  Shortness of breath, congestion, cough, headache EXAM: CHEST - 2 VIEW COMPARISON:  08/18/2020 FINDINGS: Cardiac and mediastinal contours are within normal limits. No focal pulmonary opacity. No pleural effusion or pneumothorax. No acute osseous abnormality. IMPRESSION: No acute cardiopulmonary process. Electronically Signed   By: Wiliam Ke M.D.   On: 04/15/2021 12:02    ____________________________________________   PROCEDURES  Procedure(s) performed:   Procedures  None ____________________________________________   INITIAL IMPRESSION / ASSESSMENT AND PLAN / ED COURSE  Pertinent labs & imaging results that were available during my care of the patient were reviewed by me and considered in my medical decision making (see chart for details).   This patient is Presenting for Evaluation of SOB, which does require a range of treatment options, and is a complaint that involves a high risk of morbidity and mortality.  The Differential Diagnoses CAP, COVID, Flu, PE, ACS.  Critical Interventions- DuoNeb and IVF along with labs, EKG, and CXR.    Reassessment after intervention:  Patient's lungs are more clear and she remains comfortable appearing with normal O2 saturations.    I did Additional Historical Information from friend at bedside who confirms history provided.   I decided to review pertinent External Data, and in summary last ED visit was from 12/9 for unrelated complaint. No recent PCP  notes for review. No recent ECHO or heart cath report for review.   Clinical Laboratory Tests Ordered, included CBC, BNP, Troponin, and COVID/Flu PCR.   Radiologic Tests Ordered, included CXR with I independently interpreted. No infiltrate or pulmonary edema.   Cardiac Monitor Tracing which shows NSR.   Social Determinants of Health Risk patient is a smoker.   Reevaluation with update and discussion with patient. After nebs she is feeling well. Seems most consistent with bronchitis. Perhaps some component of COPD as well. No O2 requirement.   Medical Decision Making: Summary:  Patient presents with bronchitis. No findings on exam or x-ray to strongly suspect PNA. No lab or EKG evidence of ACS or decompensated heart failure. Plan for symptom mgmt and PCP follow up with strict ED return precautions.   Disposition:  Discharge.  ____________________________________________  FINAL CLINICAL IMPRESSION(S) / ED DIAGNOSES  Final diagnoses:  SOB (shortness of breath)  Bronchitis     MEDICATIONS GIVEN DURING THIS VISIT:  Medications  ipratropium-albuterol (DUONEB) 0.5-2.5 (3) MG/3ML nebulizer solution 3 mL (3 mLs Nebulization Given 04/15/21 1316)  sodium chloride 0.9 % bolus 500 mL (0 mLs Intravenous Stopped 04/15/21 1404)     NEW OUTPATIENT MEDICATIONS STARTED DURING THIS  VISIT:  Discharge Medication List as of 04/15/2021  1:58 PM     START taking these medications   Details  benzonatate (TESSALON) 100 MG capsule Take 1 capsule (100 mg total) by mouth 3 (three) times daily as needed for cough., Starting Mon 04/15/2021, Normal    doxycycline (VIBRAMYCIN) 100 MG capsule Take 1 capsule (100 mg total) by mouth 2 (two) times daily for 7 days., Starting Mon 04/15/2021, Until Mon 04/22/2021, Normal        Note:  This document was prepared using Dragon voice recognition software and may include unintentional dictation errors.  Alona Bene, MD, [facility]    Tavish Gettis, Arlyss Repress,  MD 04/17/21 [phone]

## 2021-11-02 ENCOUNTER — Emergency Department (HOSPITAL_BASED_OUTPATIENT_CLINIC_OR_DEPARTMENT_OTHER): Payer: Medicare HMO

## 2021-11-02 ENCOUNTER — Other Ambulatory Visit: Payer: Self-pay

## 2021-11-02 ENCOUNTER — Encounter (HOSPITAL_BASED_OUTPATIENT_CLINIC_OR_DEPARTMENT_OTHER): Payer: Self-pay | Admitting: Emergency Medicine

## 2021-11-02 ENCOUNTER — Observation Stay (HOSPITAL_BASED_OUTPATIENT_CLINIC_OR_DEPARTMENT_OTHER)
Admission: EM | Admit: 2021-11-02 | Discharge: 2021-11-04 | Disposition: A | Discharge status: Home / Self Care | Payer: Medicare HMO | Attending: Internal Medicine | Admitting: Internal Medicine

## 2021-11-02 DIAGNOSIS — Z6841 Body Mass Index (BMI) 40.0 and over, adult: Secondary | ICD-10-CM | POA: Diagnosis not present

## 2021-11-02 DIAGNOSIS — G629 Polyneuropathy, unspecified: Secondary | ICD-10-CM

## 2021-11-02 DIAGNOSIS — J45909 Unspecified asthma, uncomplicated: Secondary | ICD-10-CM | POA: Diagnosis not present

## 2021-11-02 DIAGNOSIS — F418 Other specified anxiety disorders: Secondary | ICD-10-CM | POA: Diagnosis present

## 2021-11-02 DIAGNOSIS — G4700 Insomnia, unspecified: Secondary | ICD-10-CM

## 2021-11-02 DIAGNOSIS — D72829 Elevated white blood cell count, unspecified: Secondary | ICD-10-CM | POA: Insufficient documentation

## 2021-11-02 DIAGNOSIS — I10 Essential (primary) hypertension: Secondary | ICD-10-CM | POA: Insufficient documentation

## 2021-11-02 DIAGNOSIS — R519 Headache, unspecified: Secondary | ICD-10-CM

## 2021-11-02 DIAGNOSIS — D649 Anemia, unspecified: Secondary | ICD-10-CM | POA: Diagnosis not present

## 2021-11-02 DIAGNOSIS — R9082 White matter disease, unspecified: Secondary | ICD-10-CM | POA: Diagnosis not present

## 2021-11-02 DIAGNOSIS — G43109 Migraine with aura, not intractable, without status migrainosus: Secondary | ICD-10-CM | POA: Insufficient documentation

## 2021-11-02 DIAGNOSIS — F32A Depression, unspecified: Secondary | ICD-10-CM | POA: Diagnosis not present

## 2021-11-02 DIAGNOSIS — F17210 Nicotine dependence, cigarettes, uncomplicated: Secondary | ICD-10-CM | POA: Diagnosis not present

## 2021-11-02 DIAGNOSIS — Z8673 Personal history of transient ischemic attack (TIA), and cerebral infarction without residual deficits: Secondary | ICD-10-CM | POA: Diagnosis not present

## 2021-11-02 DIAGNOSIS — I639 Cerebral infarction, unspecified: Principal | ICD-10-CM | POA: Diagnosis present

## 2021-11-02 DIAGNOSIS — Z79899 Other long term (current) drug therapy: Secondary | ICD-10-CM | POA: Insufficient documentation

## 2021-11-02 DIAGNOSIS — F419 Anxiety disorder, unspecified: Secondary | ICD-10-CM | POA: Insufficient documentation

## 2021-11-02 LAB — URINALYSIS, ROUTINE W REFLEX MICROSCOPIC
Bilirubin Urine: NEGATIVE
Glucose, UA: NEGATIVE mg/dL
Hgb urine dipstick: NEGATIVE
Ketones, ur: NEGATIVE mg/dL
Leukocytes,Ua: NEGATIVE
Nitrite: NEGATIVE
Protein, ur: 30 mg/dL — AB
Specific Gravity, Urine: 1.032 — ABNORMAL HIGH (ref 1.005–1.030)
pH: 6 (ref 5.0–8.0)

## 2021-11-02 LAB — CBC
HCT: 50 % — ABNORMAL HIGH (ref 36.0–46.0)
Hemoglobin: 16.7 g/dL — ABNORMAL HIGH (ref 12.0–15.0)
MCH: 31.2 pg (ref 26.0–34.0)
MCHC: 33.4 g/dL (ref 30.0–36.0)
MCV: 93.5 fL (ref 80.0–100.0)
Platelets: 296 10*3/uL (ref 150–400)
RBC: 5.35 MIL/uL — ABNORMAL HIGH (ref 3.87–5.11)
RDW: 14.5 % (ref 11.5–15.5)
WBC: 11.4 10*3/uL — ABNORMAL HIGH (ref 4.0–10.5)
nRBC: 0 % (ref 0.0–0.2)

## 2021-11-02 LAB — COMPREHENSIVE METABOLIC PANEL
ALT: 14 U/L (ref 0–44)
AST: 19 U/L (ref 15–41)
Albumin: 4.4 g/dL (ref 3.5–5.0)
Alkaline Phosphatase: 68 U/L (ref 38–126)
Anion gap: 11 (ref 5–15)
BUN: 11 mg/dL (ref 6–20)
CO2: 24 mmol/L (ref 22–32)
Calcium: 9.3 mg/dL (ref 8.9–10.3)
Chloride: 101 mmol/L (ref 98–111)
Creatinine, Ser: 0.88 mg/dL (ref 0.44–1.00)
GFR, Estimated: >60 mL/min (ref >60)
Glucose, Bld: 132 mg/dL — ABNORMAL HIGH (ref 70–99)
Potassium: 4.4 mmol/L (ref 3.5–5.1)
Sodium: 136 mmol/L (ref 135–145)
Total Bilirubin: 0.6 mg/dL (ref 0.3–1.2)
Total Protein: 7.9 g/dL (ref 6.5–8.1)

## 2021-11-02 LAB — RAPID URINE DRUG SCREEN, HOSP PERFORMED
Amphetamines: NOT DETECTED
Barbiturates: NOT DETECTED
Benzodiazepines: POSITIVE — AB
Cocaine: NOT DETECTED
Opiates: NOT DETECTED
Tetrahydrocannabinol: NOT DETECTED

## 2021-11-02 LAB — DIFFERENTIAL
Abs Immature Granulocytes: 0.03 10*3/uL (ref 0.00–0.07)
Basophils Absolute: 0 10*3/uL (ref 0.0–0.1)
Basophils Relative: 0 %
Eosinophils Absolute: 0.2 10*3/uL (ref 0.0–0.5)
Eosinophils Relative: 1 %
Immature Granulocytes: 0 %
Lymphocytes Relative: 21 %
Lymphs Abs: 2.4 10*3/uL (ref 0.7–4.0)
Monocytes Absolute: 0.5 10*3/uL (ref 0.1–1.0)
Monocytes Relative: 4 %
Neutro Abs: 8.3 10*3/uL — ABNORMAL HIGH (ref 1.7–7.7)
Neutrophils Relative %: 74 %

## 2021-11-02 LAB — CBG MONITORING, ED: Glucose-Capillary: 148 mg/dL — ABNORMAL HIGH (ref 70–99)

## 2021-11-02 LAB — PROTIME-INR
INR: 1 (ref 0.8–1.2)
Prothrombin Time: 13.2 seconds (ref 11.4–15.2)

## 2021-11-02 LAB — PREGNANCY, URINE: Preg Test, Ur: NEGATIVE

## 2021-11-02 LAB — ETHANOL: Alcohol, Ethyl (B): <10 mg/dL (ref <10)

## 2021-11-02 MED ORDER — MIRTAZAPINE 15 MG PO TABS
15.0000 mg | ORAL_TABLET | Freq: Every day | ORAL | Status: DC
  Administered 2021-11-03 (×2): 15 mg via ORAL
  Filled 2021-11-02 (×2): qty 1

## 2021-11-02 MED ORDER — ALUM & MAG HYDROXIDE-SIMETH 200-200-20 MG/5ML PO SUSP
30.0000 mL | Freq: Once | ORAL | Status: AC
  Administered 2021-11-02: 30 mL via ORAL
  Filled 2021-11-02: qty 30

## 2021-11-02 MED ORDER — TRAZODONE HCL 50 MG PO TABS: 50.0000 mg | ORAL_TABLET | Freq: Every evening | ORAL | Status: DC | PRN

## 2021-11-02 MED ORDER — GABAPENTIN 300 MG PO CAPS
300.0000 mg | ORAL_CAPSULE | Freq: Three times a day (TID) | ORAL | Status: DC
  Administered 2021-11-03 – 2021-11-04 (×5): 300 mg via ORAL
  Filled 2021-11-02 (×5): qty 1

## 2021-11-02 MED ORDER — ACETAMINOPHEN 325 MG PO TABS
650.0000 mg | ORAL_TABLET | Freq: Once | ORAL | Status: AC
  Administered 2021-11-02: 650 mg via ORAL
  Filled 2021-11-02: qty 2

## 2021-11-02 MED ORDER — ALPRAZOLAM 0.5 MG PO TABS
0.5000 mg | ORAL_TABLET | Freq: Three times a day (TID) | ORAL | Status: DC | PRN
Start: 2021-11-02 — End: 2021-11-04

## 2021-11-02 MED ORDER — ONDANSETRON HCL 4 MG/2ML IJ SOLN
4.0000 mg | Freq: Once | INTRAMUSCULAR | Status: AC
  Administered 2021-11-02: 4 mg via INTRAVENOUS
  Filled 2021-11-02: qty 2

## 2021-11-02 MED ORDER — SODIUM CHLORIDE 0.9 % IV BOLUS
1000.0000 mL | Freq: Once | INTRAVENOUS | Status: AC
  Administered 2021-11-02: 1000 mL via INTRAVENOUS

## 2021-11-02 MED ORDER — IOHEXOL 350 MG/ML SOLN
100.0000 mL | Freq: Once | INTRAVENOUS | Status: AC | PRN
  Administered 2021-11-02: 75 mL via INTRAVENOUS

## 2021-11-02 MED ORDER — MORPHINE SULFATE (PF) 4 MG/ML IV SOLN
4.0000 mg | Freq: Once | INTRAVENOUS | Status: AC
  Administered 2021-11-02: 4 mg via INTRAVENOUS
  Filled 2021-11-02: qty 1

## 2021-11-02 NOTE — ED Notes (Signed)
Patient complained of upper abdominal pain, increased when touched.  Provider notified

## 2021-11-02 NOTE — ED Notes (Signed)
Patient ambulated to restroom.

## 2021-11-02 NOTE — ED Triage Notes (Signed)
Pt from home c/o of headache started yesterday at approx 1200 noon. Pt states that the headache was accompanied by balance issues and dizziness. Pt states that left eye "feels Droopy." Pt reports nausea by no episodes of emesis.

## 2021-11-02 NOTE — ED Provider Notes (Signed)
[facility] EMERGENCY DEPT Provider Note   CSN: [id number] Arrival date & time: 11/02/21  1223     History  Chief Complaint  Patient presents with   Headache    [name] is a 51 y.o. female.  With past medical history of GERD, hypertension, obesity who presents to the emergency department with headache.  Patient states that symptoms began yesterday around noon.  She states that she noticed a gradual onset of a left parietal occipital headache that she describes as a jackhammer in her head.  She states that it has been persistent since then.  States that overnight she got up to go to the bathroom a few times and felt as if her left eyelid was drooping.  She states that she never actually looked in the mirror but just had this sensation.  Additionally she notes that this morning she began to have sensation of being unbalanced, primarily to the left side and some mild nausea.  She has also had associated photophobia.  She denies any head or neck trauma.  Denies numbness or tingling to her face or extremities, slurred speech, fevers, vomiting, visual disturbances.  She is not on anticoagulation.     Headache Associated symptoms: dizziness, nausea and photophobia   Associated symptoms: no fever, no numbness and no weakness    Headache Associated symptoms: dizziness and photophobia   Associated symptoms: no fever, no numbness and no weakness    Headache Associated symptoms: dizziness   Associated symptoms: no fever, no numbness and no weakness    Headache Associated symptoms: dizziness   Associated symptoms: no numbness and no weakness    Headache Associated symptoms: dizziness   Associated symptoms: no numbness    Headache Associated symptoms: dizziness        Home Medications Prior to Admission medications   Medication Sig Start Date End Date Taking? Authorizing Provider  acetaminophen (TYLENOL) 500 MG tablet Take 1,000 mg by mouth every 4 (four)  hours as needed for fever or headache.    [provider]  albuterol (VENTOLIN HFA) 108 (90 Base) MCG/ACT inhaler Inhale 1-2 puffs into the lungs every 6 (six) hours as needed for wheezing or shortness of breath. 04/15/21   Long, Arlyss Repress, MD  ALPRAZolam Prudy Feeler) 0.5 MG tablet Take 0.5 mg by mouth 3 (three) times daily as needed for anxiety.  05/06/18   [provider]  azelastine (ASTELIN) 137 MCG/SPRAY nasal spray Place 2 sprays into both nostrils 2 (two) times daily as needed for rhinitis. Use in each nostril as directed    [provider]  benzonatate (TESSALON) 100 MG capsule Take 1 capsule (100 mg total) by mouth 3 (three) times daily as needed for cough. 04/15/21   Long, Arlyss Repress, MD  buPROPion (WELLBUTRIN XL) 300 MG 24 hr tablet Take 300 mg by mouth daily.    [provider]  cephALEXin (KEFLEX) 500 MG capsule Take 1 capsule (500 mg total) by mouth 4 (four) times daily. 08/02/20   Jacalyn Lefevre, MD  cetirizine (ZYRTEC) 10 MG tablet Take 10 mg by mouth daily as needed for allergies.     [provider]  collagenase (SANTYL) ointment Apply 1 application topically every other day. Apply to right and left abdominal wounds every other day. 03/15/19   Dozier-Lineberger, Mayah M, NP  gabapentin (NEURONTIN) 300 MG capsule Take 300 mg by mouth 3 (three) times daily. 01/22/19   [provider]  HYDROcodone-acetaminophen (NORCO/VICODIN) 5-325 MG tablet Take 1 tablet  by mouth every 4 (four) hours as needed. 08/02/20   Jacalyn LefevreHaviland, Julie, MD  Lifitegrast Benay Spice(XIIDRA) 5 % SOLN Apply to eye.    [provider]  methocarbamol (ROBAXIN) 500 MG tablet Take 1 tablet (500 mg total) by mouth every 8 (eight) hours as needed for muscle spasms. 03/15/21   Benjiman CorePickering, Nathan, MD  mirtazapine (REMERON) 15 MG tablet Take 1 tablet (15 mg total) by mouth at bedtime. For mood control 03/27/17   Money, Feliz Beamravis B, FNP  montelukast (SINGULAIR) 10 MG tablet Take 10 mg by mouth at  bedtime.  02/04/17   [provider]  olmesartan (BENICAR) 40 MG tablet Take 40 mg by mouth every morning.  04/13/17   [provider]  pantoprazole (PROTONIX) 40 MG tablet Take 40 mg by mouth 2 (two) times daily.     [provider]  polyethylene glycol (MIRALAX / GLYCOLAX) 17 g packet Take 17 g by mouth daily as needed for mild constipation. 09/25/18   Marguerita MerlesSheikh, Omair Latif, DO  promethazine (PHENERGAN) 25 MG tablet Take 25 mg by mouth every 8 (eight) hours as needed for nausea or vomiting.  12/31/17   [provider]  senna (SENOKOT) 8.6 MG tablet Take 1 tablet by mouth daily as needed for constipation.    [provider]  traZODone (DESYREL) 50 MG tablet Take 1 tablet (50 mg total) by mouth at bedtime as needed for sleep. For sleep Patient taking differently: Take 50 mg by mouth at bedtime. For sleep 03/27/17   Money, Gerlene Burdockravis B, FNP  venlafaxine XR (EFFEXOR-XR) 150 MG 24 hr capsule Take 150 mg by mouth daily with breakfast.  05/04/18   [provider]      Allergies    Adhesive [tape], Influenza vaccines, and Lisinopril-hydrochlorothiazide    Review of Systems   Review of Systems  Constitutional:  Negative for fever.  Eyes:  Positive for photophobia. Negative for visual disturbance.  Gastrointestinal:  Positive for nausea.  Neurological:  Positive for dizziness and headaches. Negative for syncope, speech difficulty, weakness and numbness.  All other systems reviewed and are negative.   Physical Exam Updated Vital Signs BP (!) 145/91 (BP Location: Right Arm)   Pulse 81   Temp 97.7 F (36.5 C) (Oral)   Resp 17   Ht 5\' 11"  (1.803 m)   Wt (!) 154.2 kg   SpO2 95%   BMI 47.42 kg/m  Physical Exam Vitals and nursing note reviewed.  Constitutional:      General: She is not in acute distress.    Appearance: Normal appearance. She is obese. She is ill-appearing.  HENT:     Head: Normocephalic and atraumatic.     Mouth/Throat:      Mouth: Mucous membranes are moist.     Pharynx: Oropharynx is clear.  Eyes:     General: No visual field deficit or scleral icterus.    Extraocular Movements: Extraocular movements intact.     Right eye: Normal extraocular motion.     Left eye: Normal extraocular motion.     Pupils: Pupils are equal, round, and reactive to light.  Cardiovascular:     Rate and Rhythm: Normal rate and regular rhythm.     Heart sounds: Normal heart sounds.  Pulmonary:     Effort: Pulmonary effort is normal. No respiratory distress.     Breath sounds: Normal breath sounds.  Abdominal:     General: Bowel sounds are normal.     Palpations: Abdomen is soft.  Musculoskeletal:  General: Normal range of motion.     Cervical back: Neck supple.  Skin:    General: Skin is warm and dry.     Capillary Refill: Capillary refill takes less than 2 seconds.  Neurological:     General: No focal deficit present.     Mental Status: She is alert and oriented to person, place, and time.     GCS: GCS eye subscore is 4. GCS verbal subscore is 5. GCS motor subscore is 6.     Cranial Nerves: Cranial nerves 2-12 are intact. No cranial nerve deficit, dysarthria or facial asymmetry.     Sensory: Sensation is intact.     Motor: Motor function is intact. No weakness or pronator drift.     Coordination: Finger-Nose-Finger Test abnormal. Heel to Shin Test normal.  Psychiatric:        Mood and Affect: Mood normal.        Behavior: Behavior normal.    ED Results / Procedures / Treatments   Labs (all labs ordered are listed, but only abnormal results are displayed) Labs Reviewed  CBC - Abnormal; Notable for the following components:      Result Value   WBC 11.4 (*)    RBC 5.35 (*)    Hemoglobin 16.7 (*)    HCT 50.0 (*)    All other components within normal limits  DIFFERENTIAL - Abnormal; Notable for the following components:   Neutro Abs 8.3 (*)    All other components within normal limits  COMPREHENSIVE METABOLIC  PANEL - Abnormal; Notable for the following components:   Glucose, Bld 132 (*)    All other components within normal limits  RAPID URINE DRUG SCREEN, HOSP PERFORMED - Abnormal; Notable for the following components:   Benzodiazepines POSITIVE (*)    All other components within normal limits  URINALYSIS, ROUTINE W REFLEX MICROSCOPIC - Abnormal; Notable for the following components:   APPearance HAZY (*)    Specific Gravity, Urine 1.032 (*)    Protein, ur 30 (*)    All other components within normal limits  CBG MONITORING, ED - Abnormal; Notable for the following components:   Glucose-Capillary 148 (*)    All other components within normal limits  ETHANOL  PROTIME-INR  PREGNANCY, URINE   EKG None  Radiology CT ANGIO HEAD NECK W WO CM  Result Date: 11/02/2021 CLINICAL DATA:  Neuro deficit, acute, stroke suspected. Dizziness. Loss of balance. EXAM: CT ANGIOGRAPHY HEAD AND NECK TECHNIQUE: Multidetector CT imaging of the head and neck was performed using the standard protocol during bolus administration of intravenous contrast. Multiplanar CT image reconstructions and MIPs were obtained to evaluate the vascular anatomy. Carotid stenosis measurements (when applicable) are obtained utilizing NASCET criteria, using the distal internal carotid diameter as the denominator. RADIATION DOSE REDUCTION: This exam was performed according to the departmental dose-optimization program which includes automated exposure control, adjustment of the mA and/or kV according to patient size and/or use of iterative reconstruction technique. CONTRAST:  27mL OMNIPAQUE IOHEXOL 350 MG/ML SOLN COMPARISON:  CT head without contrast 08/18/2020. MR head without contrast 03/23/2017 FINDINGS: CT HEAD FINDINGS Brain: No acute infarct, hemorrhage, or mass lesion is present. No significant white matter lesions are present. The ventricles are of normal size. No significant extraaxial fluid collection is present. The brainstem and  cerebellum are within normal limits. Vascular: No hyperdense vessel or unexpected calcification. Skull: Calvarium is intact. No focal lytic or blastic lesions are present. No significant extracranial soft tissue lesion is present.  Sinuses/Orbits: The paranasal sinuses and mastoid air cells are clear. The globes and orbits are within normal limits. Other: Review of the MIP images confirms the above findings CTA NECK FINDINGS Aortic arch: A 3 vessel arch configuration is present. No significant vascular disease is present at the arch. Right carotid system: The right common carotid artery is within normal limits. Bifurcation is unremarkable. Moderate tortuosity is present cervical right ICA without significant stenosis. Left carotid system: The left common carotid artery is within normal limits. Bifurcation is unremarkable. Mild tortuosity is present cervical left ICA without significant stenosis. Vertebral arteries: The left vertebral artery is the dominant vessel. Both vertebral arteries originate from the subclavian arteries. No significant stenosis is present in either vertebral artery in the neck. Skeleton: Vertebral body heights and alignment are normal. No focal osseous lesions are present. Other neck: Soft tissues the neck are otherwise unremarkable. Salivary glands are within normal limits. Thyroid is normal. No significant adenopathy is present. No focal mucosal or submucosal lesions are present. Upper chest: The lung apices are clear. Thoracic inlet is within normal limits. Review of the MIP images confirms the above findings CTA HEAD FINDINGS Anterior circulation: Internal carotid arteries are within normal limits from the skull base through the ICA termini. The A1 and M1 segments are normal. No definite anterior communicating artery present. The MCA bifurcations are within normal limits. ACA and MCA branch vessels are normal. Posterior circulation: The left posterior cerebral artery is the dominant vessel.  PICA origins are visualized and within normal limits. Vertebrobasilar junction and basilar artery are normal. Both posterior cerebral arteries originate from basilar tip. PCA branch vessels are within normal limits bilaterally. Venous sinuses: The dural sinuses are patent. The straight sinus deep cerebral veins are intact. Cortical veins are within normal limits. No significant vascular malformation is evident. Anatomic variants: None Review of the MIP images confirms the above findings IMPRESSION: 1. Moderate tortuosity of the cervical internal carotid arteries bilaterally without significant stenosis. This is nonspecific, but can be seen in the setting of chronic hypertension. 2. Otherwise normal CTA circle-of-Willis without significant proximal stenosis, aneurysm, or branch vessel occlusion. Electronically Signed   By: Marin Roberts M.D.   On: 11/02/2021 15:24    Procedures Procedures   Medications Ordered in ED Medications  sodium chloride 0.9 % bolus 1,000 mL (0 mLs Intravenous Stopped 11/02/21 1642)  ondansetron (ZOFRAN) injection 4 mg (4 mg Intravenous Given 11/02/21 1423)  morphine (PF) 4 MG/ML injection 4 mg (4 mg Intravenous Given 11/02/21 1424)  iohexol (OMNIPAQUE) 350 MG/ML injection 100 mL (75 mLs Intravenous Contrast Given 11/02/21 1438)  alum & mag hydroxide-simeth (MAALOX/MYLANTA) 200-200-20 MG/5ML suspension 30 mL (30 mLs Oral Given 11/02/21 1529)    ED Course/ Medical Decision Making/ A&P                           Medical Decision Making Amount and/or Complexity of Data Reviewed Labs: ordered. Radiology: ordered.  Risk OTC drugs. Prescription drug management. Decision regarding hospitalization.  This patient presents to the ED for concern of headache, this involves an extensive number of treatment options, and is a complaint that carries with it a high risk of complications and morbidity.  The differential diagnosis includes stroke, ICH, migraine, encephalitis,  concussion, temporal arteritis, acute angle-closure glaucoma, etc.  Co morbidities that complicate the patient evaluation Hypertension, obesity  Additional history obtained:  Additional history obtained from: Husband at bedside External records from outside source  obtained and reviewed including: Most recent primary care physician note  EKG: EKG: normal EKG, normal sinus rhythm.   Cardiac Monitoring: The patient was maintained on a cardiac monitor.  I personally viewed and interpreted the cardiac monitored which showed an underlying rhythm of: Sinus rhythm  Lab Results: I personally ordered, reviewed, and interpreted labs. Pertinent results include: CBC with mild leukocytosis to 11.4, may be hemoconcentrated as hemoglobin is also elevated CMP without electrolyte derangement, AKI, transaminitis Glucose 148 Ethanol negative, coags normal, UA negative  Imaging Studies ordered:  I ordered imaging studies which included CT.  I independently reviewed & interpreted imaging & am in agreement with radiology impression. Imaging shows: CT angio head and neck negative for acute stroke MR brain pending  Medications  I ordered medication including IV fluids, morphine and Zofran for headache cocktail Reevaluation of the patient after medication shows that patient improved -I reviewed the patient's home medications and did not make adjustments. -I did not prescribe new home medications.  Tests Considered: N/A  Critical Interventions: Neurology consult  Consultations: I requested consultation with the neurologist, Dr. Amada Jupiter,  and discussed lab and imaging findings as well as pertinent plan - they recommend: Admission and stroke work-up.  Consulted with Dr. Chipper Herb, hospitalist who agrees to admission.  SDH None identified  ED Course:  50 year old female who presents to the emergency department with headache and feeling unbalanced.  Physical exam notable for left hand dysmetria.   Her symptoms are concerning for acute CVA versus TIA.  Other items on the differential include dissection, ACS, hypoglycemia or other metabolic derangement such as hepatic or uremic encephalopathy, medication side effect, postictal Todd's paralysis.  However her presentation is most concerning for a stroke.  Her EKG is without evidence of a STEMI.  Doubt that this is ACS. CMP without hypoglycemia or other metabolic derangement such as hyper hyponatremia or transaminitis. Her clinical picture does not suggest a stroke mimic.  She is not inebriated.  Symptoms are inconsistent with dissection.  She has no evidence of AKI or transaminitis concerning for encephalopathy.  No medications that would cause this.  No history of seizures or concern for Todd's paralysis. Obtain a CTA of her head and neck which was negative for any acute stroke.  I consulted with Dr. Amada Jupiter with neurology who recommends that she be admitted for stroke work-up as her symptoms are concerning.  I have already placed the order for MRI brain.  In the meantime I ordered a migraine cocktail which improved some of her symptoms.  Talked with Dr. Chipper Herb, hospitalist who agrees to admit the patient.  I discussed the findings with the patient and she is agreeable to transfer and ongoing work-up.  I have answered all of her questions at this time.   After consideration of the diagnostic results and the patients response to treatment, I feel that the patent would benefit from admission. The patient has been appropriately medically screened and/or stabilized in the ED.  Final Clinical Impression(s) / ED Diagnoses Final diagnoses:  Acute nonintractable headache, unspecified headache type    Rx / DC Orders ED Discharge Orders     None         Cristopher Peru, PA-C 11/02/21 1728    Milagros Loll, MD 11/03/21 [phone]

## 2021-11-02 NOTE — ED Notes (Signed)
Patient transported to CT 

## 2021-11-02 NOTE — ED Notes (Signed)
Relief of abdominal pain after Maalox.

## 2021-11-03 ENCOUNTER — Observation Stay (HOSPITAL_COMMUNITY): Payer: Medicare HMO

## 2021-11-03 DIAGNOSIS — G43809 Other migraine, not intractable, without status migrainosus: Secondary | ICD-10-CM

## 2021-11-03 DIAGNOSIS — G629 Polyneuropathy, unspecified: Secondary | ICD-10-CM

## 2021-11-03 DIAGNOSIS — I63439 Cerebral infarction due to embolism of unspecified posterior cerebral artery: Secondary | ICD-10-CM | POA: Diagnosis not present

## 2021-11-03 DIAGNOSIS — R519 Headache, unspecified: Secondary | ICD-10-CM

## 2021-11-03 DIAGNOSIS — I639 Cerebral infarction, unspecified: Secondary | ICD-10-CM

## 2021-11-03 DIAGNOSIS — G4700 Insomnia, unspecified: Secondary | ICD-10-CM

## 2021-11-03 LAB — HIV ANTIBODY (ROUTINE TESTING W REFLEX): HIV Screen 4th Generation wRfx: NONREACTIVE

## 2021-11-03 LAB — FERRITIN: Ferritin: 52 ng/mL (ref 11–307)

## 2021-11-03 LAB — CBC WITH DIFFERENTIAL/PLATELET
Abs Immature Granulocytes: 0.02 10*3/uL (ref 0.00–0.07)
Basophils Absolute: 0.1 10*3/uL (ref 0.0–0.1)
Basophils Relative: 1 %
Eosinophils Absolute: 0.2 10*3/uL (ref 0.0–0.5)
Eosinophils Relative: 2 %
HCT: 44.4 % (ref 36.0–46.0)
Hemoglobin: 14.5 g/dL (ref 12.0–15.0)
Immature Granulocytes: 0 %
Lymphocytes Relative: 23 %
Lymphs Abs: 2.3 10*3/uL (ref 0.7–4.0)
MCH: 30.7 pg (ref 26.0–34.0)
MCHC: 32.7 g/dL (ref 30.0–36.0)
MCV: 94.1 fL (ref 80.0–100.0)
Monocytes Absolute: 0.6 10*3/uL (ref 0.1–1.0)
Monocytes Relative: 6 %
Neutro Abs: 7 10*3/uL (ref 1.7–7.7)
Neutrophils Relative %: 68 %
Platelets: 240 10*3/uL (ref 150–400)
RBC: 4.72 MIL/uL (ref 3.87–5.11)
RDW: 14.3 % (ref 11.5–15.5)
WBC: 10.2 10*3/uL (ref 4.0–10.5)
nRBC: 0 % (ref 0.0–0.2)

## 2021-11-03 LAB — PHOSPHORUS: Phosphorus: 3 mg/dL (ref 2.5–4.6)

## 2021-11-03 LAB — COMPREHENSIVE METABOLIC PANEL
ALT: 65 U/L — ABNORMAL HIGH (ref 0–44)
AST: 83 U/L — ABNORMAL HIGH (ref 15–41)
Albumin: 3.3 g/dL — ABNORMAL LOW (ref 3.5–5.0)
Alkaline Phosphatase: 98 U/L (ref 38–126)
Anion gap: 6 (ref 5–15)
BUN: 9 mg/dL (ref 6–20)
CO2: 25 mmol/L (ref 22–32)
Calcium: 8.8 mg/dL — ABNORMAL LOW (ref 8.9–10.3)
Chloride: 106 mmol/L (ref 98–111)
Creatinine, Ser: 0.86 mg/dL (ref 0.44–1.00)
GFR, Estimated: >60 mL/min (ref >60)
Glucose, Bld: 165 mg/dL — ABNORMAL HIGH (ref 70–99)
Potassium: 3.7 mmol/L (ref 3.5–5.1)
Sodium: 137 mmol/L (ref 135–145)
Total Bilirubin: 0.8 mg/dL (ref 0.3–1.2)
Total Protein: 7 g/dL (ref 6.5–8.1)

## 2021-11-03 LAB — LIPID PANEL
Cholesterol: 149 mg/dL (ref 0–200)
HDL: 44 mg/dL (ref >40)
LDL Cholesterol: 78 mg/dL (ref 0–99)
Total CHOL/HDL Ratio: 3.4 RATIO
Triglycerides: 134 mg/dL (ref <150)
VLDL: 27 mg/dL (ref 0–40)

## 2021-11-03 LAB — MAGNESIUM: Magnesium: 1.9 mg/dL (ref 1.7–2.4)

## 2021-11-03 LAB — IRON AND TIBC
Iron: 149 ug/dL (ref 28–170)
Saturation Ratios: 45 % — ABNORMAL HIGH (ref 10.4–31.8)
TIBC: 329 ug/dL (ref 250–450)
UIBC: 180 ug/dL

## 2021-11-03 LAB — FOLATE: Folate: 12.5 ng/mL (ref >5.9)

## 2021-11-03 LAB — RETICULOCYTES
Immature Retic Fract: 12.6 % (ref 2.3–15.9)
RBC.: 4.67 MIL/uL (ref 3.87–5.11)
Retic Count, Absolute: 87.8 10*3/uL (ref 19.0–186.0)
Retic Ct Pct: 1.9 % (ref 0.4–3.1)

## 2021-11-03 LAB — HEMOGLOBIN A1C
Hgb A1c MFr Bld: 6 % — ABNORMAL HIGH (ref 4.8–5.6)
Mean Plasma Glucose: 125.5 mg/dL

## 2021-11-03 LAB — GLUCOSE, CAPILLARY: Glucose-Capillary: 125 mg/dL — ABNORMAL HIGH (ref 70–99)

## 2021-11-03 LAB — VITAMIN B12: Vitamin B-12: 577 pg/mL (ref 180–914)

## 2021-11-03 MED ORDER — KETOROLAC TROMETHAMINE 15 MG/ML IJ SOLN
15.0000 mg | Freq: Four times a day (QID) | INTRAMUSCULAR | Status: DC | PRN
Start: 2021-11-03 — End: 2021-11-04
  Administered 2021-11-03 – 2021-11-04 (×3): 15 mg via INTRAVENOUS
  Filled 2021-11-03 (×4): qty 1

## 2021-11-03 MED ORDER — METOCLOPRAMIDE HCL 5 MG/ML IJ SOLN
5.0000 mg | Freq: Three times a day (TID) | INTRAMUSCULAR | Status: DC | PRN
  Administered 2021-11-03: 5 mg via INTRAVENOUS
  Filled 2021-11-03: qty 2

## 2021-11-03 MED ORDER — ASPIRIN 81 MG PO TBEC
81.0000 mg | DELAYED_RELEASE_TABLET | Freq: Every day | ORAL | Status: DC
  Administered 2021-11-03 – 2021-11-04 (×2): 81 mg via ORAL
  Filled 2021-11-03 (×2): qty 1

## 2021-11-03 MED ORDER — ACETAMINOPHEN 325 MG PO TABS
650.0000 mg | ORAL_TABLET | Freq: Four times a day (QID) | ORAL | Status: DC | PRN
  Administered 2021-11-04: 650 mg via ORAL
  Filled 2021-11-03: qty 2

## 2021-11-03 MED ORDER — ATORVASTATIN CALCIUM 40 MG PO TABS
40.0000 mg | ORAL_TABLET | Freq: Every day | ORAL | Status: DC
  Administered 2021-11-03 – 2021-11-04 (×2): 40 mg via ORAL
  Filled 2021-11-03 (×2): qty 1

## 2021-11-03 MED ORDER — ENOXAPARIN SODIUM 80 MG/0.8ML IJ SOSY
75.0000 mg | PREFILLED_SYRINGE | INTRAMUSCULAR | Status: DC
Start: 2021-11-03 — End: 2021-11-04
  Administered 2021-11-03 – 2021-11-04 (×2): 75 mg via SUBCUTANEOUS
  Filled 2021-11-03 (×2): qty 0.8

## 2021-11-03 MED ORDER — DIPHENHYDRAMINE HCL 50 MG/ML IJ SOLN
25.0000 mg | Freq: Three times a day (TID) | INTRAMUSCULAR | Status: DC | PRN
  Administered 2021-11-03: 25 mg via INTRAVENOUS
  Filled 2021-11-03: qty 1

## 2021-11-03 NOTE — H&P (Signed)
History and Physical  [name] CZY:[id number] DOB: 1971/04/07 DOA: 11/02/2021  Referring physician: Accepted by Dr. Chipper Herb, [facility], Hospitalist service.  PCP: Eartha Inch, MD  Outpatient Specialists: Neurosurgery, psychiatry   Patient coming from: Home.  Chief Complaint: Headache  HPI: [name] is a 51 y.o. female with medical history significant for severe morbid obesity, essential hypertension, GERD, chronic anxiety/depression, psoriatic arthritis, cervical spondylosis status post arthropathy of cervical facet joint, who presented to [facility] as a direct admit from [facility] ED due to concern for TIA/stroke.  The patient initially presented at [facility] ED with concerns of headache that began on 11/01/2021 around noon.  She noticed the gradual onset of left parietal occipital headache described as jackhammering the head.  Associated with a sensation of being imbalanced, mild nausea and photophobia.  Noncontrast CT head in the ED with no acute intracranial abnormality.  CT angio head and neck revealed moderate tortuosity of the cervical internal carotid arteries bilaterally without significant stenosis, otherwise normal CTA.  EDP discussed the case with neurology/stroke team who recommended admission for stroke work-up.  The patient was admitted by [facility], hospitalist service, and transferred to [facility] for further work-up.  At the time of this visit, the patient is oriented x3.  Headache is 2 out of 10.  She has no significant motor deficits.  Her finger-to-nose finger test is slightly abnormal.  MRI brain is pending.  ED Course: Tmax 98.2.  BP 125/85, pulse 71, respiratory 19, O2 saturation 94% on room air.  Lab studies remarkable for serum glucose 122.  High-sensitivity troponin less than 2.  BNP 21.  WBC 11.4.  Hemoglobin 16.7, neutrophil count 8.3.  Review of Systems: Review of systems as noted in the HPI. All other systems reviewed and are negative.   Past Medical  History:  Diagnosis Date   Anxiety    Arthritis    joints   Asthma    Endometriosis    Fat necrosis of breast    right   GERD (gastroesophageal reflux disease)    History of hiatal hernia    s/p repair few times   History of suicide attempt 03/2017   Hypertension    MDD (major depressive disorder)    Panniculitis    Pneumonia 10/2018   in hospital with pneumonia   PONV (postoperative nausea and vomiting)    Psoriasis    treated with Humeria   Past Surgical History:  Procedure Laterality Date   ACHILLES TENDON REPAIR Left 01-11-2015  @[facility]    BREAST CYST EXCISION Right 01/24/2019   Procedure: EXCISIN OF RIGHT BREAST FAT NECROSIS;  Surgeon: 01/26/2019, DO;  Location: [facility];  Service: Plastics;  Laterality: Right;   BREAST REDUCTION SURGERY Bilateral 01/09/2016   Procedure: MAMMARY REDUCTION  (BREAST) BILATERAL;  Surgeon: 03/10/2016, DO;  Location: [facility];  Service: Plastics;  Laterality: Bilateral;   CARPOMETACARPAL (CMC) FUSION OF THUMB Right 05-04-2015   @[facility]    COMBINED HYSTEROSCOPY DIAGNOSTIC / D&C  06-23-2003     dr @[facility]    D & C HYSTERSCOPY /  DX LAPAROSCOPY CONVERSION LAPAROTOMY LYSIS ADHESIONS WITH RIGHT SALPINECTOMY  12-26-2003    dr Ambrose Mantle  @[facility]    DEBRIDEMENT AND CLOSURE WOUND N/A 02/07/2019   Procedure: DEBRIDEMENT OF ABDOMINAL WOUND, PLACEMENT OF A-CELL, PLACEMENT OF WOUND VAC;  Surgeon: 12-28-2003, DO;  Location: [facility];  Service: Plastics;  Laterality: N/A;   INCISION AND DRAINAGE OF WOUND Right 06/22/2019  Procedure: Excision of abdominal wound with closure;  Surgeon: Wallace Going, DO;  Location: [facility];  Service: Plastics;  Laterality: Right;  Total case time is 1 hour   LAPAROSCOPIC CHOLECYSTECTOMY  10-19-2002   dr Deon Pilling  @[facility]    LAPAROSCOPIC GASTRIC BANDING  09-25-2003  @[facility]    WITH HIATAL HERNIA REPAIR   LAPAROSCOPIC LYSIS OF ADHESIONS N/A 01/24/2019   Procedure: LAPAROSCOPY WITH LYSIS  OF ADHESIONS;  Surgeon: Johnathan Hausen, MD;  Location: [facility];  Service: General;  Laterality: N/A;   LAPAROSCOPIC REPAIR AND REMOVAL OF GASTRIC BAND  02-06-2006  @[facility]    LAPAROTOMY LYSIS ADHESIONS , UTERINE BIOPSY  01-31-2000   dr lowe  @[facility]    OPEN WOUND DEBRIDEMENT ANTERIOR ABDOMINAL WALL , REMOVAL FORGEIN BODY'S  05-10-2001   dr Deon Pilling  @[facility]    PANNICULECTOMY N/A 01/24/2019   Procedure: PANNICULECTOMY;  Surgeon: Wallace Going, DO;  Location: [facility];  Service: Plastics;  Laterality: N/A;   REPAIR RECURRENT  HIATAL HERNIA   04-23-2004  @[facility]    TAKEDOWN RECURRENT HIATAL HERNIA FROM PREVIOUS MESH REPAIR OF DIAPHRAGM/ REPAIR HIATAL HERNIA AND THE GASTROPEXY  11/05/2004   TONSILLECTOMY  age 38   TOTAL ABDOMINAL HYSTERECTOMY W/ BILATERAL SALPINGOOPHORECTOMY  12-23-2005   dr soper   W/  BILATERAL URETEROLYSIS AND EXTENSIVE ABD. LYSIS ADHESIONS   UMBILICAL HERNIA REPAIR  12/27/2009   LAPAROSCOPY TAKEDOWN INCARCERATED UMBILICAL HERNIA WITH REPAIR/ OPEN REPAIR COMPLEX LOWER INCISIONAL HERNIA   VAGOTOMY  2006    Social History:  reports that she has been smoking cigarettes. She has a 20.00 pack-year smoking history. She has never used smokeless tobacco. She reports current alcohol use. She reports that she does not use drugs.   Allergies  Allergen Reactions   Adhesive [Tape]     Rash, blisters   Influenza Vaccines     States her "arm had a knot and turned red"   Lisinopril-Hydrochlorothiazide     welts    Family History  Problem Relation Age of Onset   Cancer Maternal Aunt        melanoma   Diabetes Father    Heart attack Father 66      Prior to Admission medications   Medication Sig Start Date End Date Taking? Authorizing Provider  acetaminophen (TYLENOL) 500 MG tablet Take 1,000 mg by mouth every 4 (four) hours as needed for fever or headache.    [provider]  albuterol (VENTOLIN HFA) 108 (90 Base) MCG/ACT inhaler Inhale 1-2 puffs into the lungs every 6 (six) hours as  needed for wheezing or shortness of breath. 04/15/21   Long, Wonda Olds, MD  ALPRAZolam Duanne Moron) 0.5 MG tablet Take 0.5 mg by mouth 3 (three) times daily as needed for anxiety.  05/06/18   [provider]  azelastine (ASTELIN) 137 MCG/SPRAY nasal spray Place 2 sprays into both nostrils 2 (two) times daily as needed for rhinitis. Use in each nostril as directed    [provider]  benzonatate (TESSALON) 100 MG capsule Take 1 capsule (100 mg total) by mouth 3 (three) times daily as needed for cough. 04/15/21   Long, Wonda Olds, MD  buPROPion (WELLBUTRIN XL) 300 MG 24 hr tablet Take 300 mg by mouth daily.    [provider]  cephALEXin (KEFLEX) 500 MG capsule Take 1 capsule (500 mg total) by mouth 4 (four) times daily. 08/02/20   Isla Pence, MD  cetirizine (ZYRTEC) 10 MG tablet Take 10 mg by mouth daily as needed for  allergies.     [provider]  collagenase (SANTYL) ointment Apply 1 application topically every other day. Apply to right and left abdominal wounds every other day. 03/15/19   Dozier-Lineberger, Mayah M, NP  gabapentin (NEURONTIN) 300 MG capsule Take 300 mg by mouth 3 (three) times daily. 01/22/19   [provider]  HYDROcodone-acetaminophen (NORCO/VICODIN) 5-325 MG tablet Take 1 tablet by mouth every 4 (four) hours as needed. 08/02/20   Isla Pence, MD  Lifitegrast Shirley Friar) 5 % SOLN Apply to eye.    [provider]  methocarbamol (ROBAXIN) 500 MG tablet Take 1 tablet (500 mg total) by mouth every 8 (eight) hours as needed for muscle spasms. 03/15/21   Davonna Belling, MD  mirtazapine (REMERON) 15 MG tablet Take 1 tablet (15 mg total) by mouth at bedtime. For mood control 03/27/17   Money, Darnelle Maffucci B, FNP  montelukast (SINGULAIR) 10 MG tablet Take 10 mg by mouth at bedtime.  02/04/17   [provider]  olmesartan (BENICAR) 40 MG tablet Take 40 mg by mouth every morning.  04/13/17   [provider]  pantoprazole (PROTONIX) 40  MG tablet Take 40 mg by mouth 2 (two) times daily.     [provider]  polyethylene glycol (MIRALAX / GLYCOLAX) 17 g packet Take 17 g by mouth daily as needed for mild constipation. 09/25/18   Raiford Noble Latif, DO  promethazine (PHENERGAN) 25 MG tablet Take 25 mg by mouth every 8 (eight) hours as needed for nausea or vomiting.  12/31/17   [provider]  senna (SENOKOT) 8.6 MG tablet Take 1 tablet by mouth daily as needed for constipation.    [provider]  traZODone (DESYREL) 50 MG tablet Take 1 tablet (50 mg total) by mouth at bedtime as needed for sleep. For sleep Patient taking differently: Take 50 mg by mouth at bedtime. For sleep 03/27/17   Money, Lowry Ram, FNP  venlafaxine XR (EFFEXOR-XR) 150 MG 24 hr capsule Take 150 mg by mouth daily with breakfast.  05/04/18   [provider]    Physical Exam: BP 125/85 (BP Location: Left Arm)   Pulse 71   Temp 97.8 F (36.6 C) (Oral)   Resp 19   Ht 5\' 11"  (1.803 m)   Wt (!) 154.2 kg   SpO2 94%   BMI 47.42 kg/m   General: 51 y.o. year-old female well developed well nourished in no acute distress.  Alert and oriented x3. Cardiovascular: Regular rate and rhythm with no rubs or gallops.  No thyromegaly or JVD noted.  No lower extremity edema. 2/4 pulses in all 4 extremities. Respiratory: Clear to auscultation with no wheezes or rales. Good inspiratory effort. Abdomen: Soft nontender nondistended with normal bowel sounds x4 quadrants. Muskuloskeletal: No cyanosis, clubbing or edema noted bilaterally Neuro: CN II-XII intact, strength, sensation, reflexes Skin: No ulcerative lesions noted or rashes Psychiatry: Judgement and insight appear normal. Mood is appropriate for condition and setting          Labs on Admission:  Basic Metabolic Panel: Recent Labs  Lab 11/02/21 1309  NA 136  K 4.4  CL 101  CO2 24  GLUCOSE 132*  BUN 11  CREATININE 0.88  CALCIUM 9.3   Liver Function Tests: Recent Labs   Lab 11/02/21 1309  AST 19  ALT 14  ALKPHOS 68  BILITOT 0.6  PROT 7.9  ALBUMIN 4.4   No results for input(s): "LIPASE", "AMYLASE" in the last 168 hours. No results for  input(s): "AMMONIA" in the last 168 hours. CBC: Recent Labs  Lab 11/02/21 1309  WBC 11.4*  NEUTROABS 8.3*  HGB 16.7*  HCT 50.0*  MCV 93.5  PLT 296   Cardiac Enzymes: No results for input(s): "CKTOTAL", "CKMB", "CKMBINDEX", "TROPONINI" in the last 168 hours.  BNP (last 3 results) Recent Labs    04/15/21 1242  BNP 21.3    ProBNP (last 3 results) No results for input(s): "PROBNP" in the last 8760 hours.  CBG: Recent Labs  Lab 11/02/21 1304  GLUCAP 148*    Radiological Exams on Admission: CT ANGIO HEAD NECK W WO CM  Result Date: 11/02/2021 CLINICAL DATA:  Neuro deficit, acute, stroke suspected. Dizziness. Loss of balance. EXAM: CT ANGIOGRAPHY HEAD AND NECK TECHNIQUE: Multidetector CT imaging of the head and neck was performed using the standard protocol during bolus administration of intravenous contrast. Multiplanar CT image reconstructions and MIPs were obtained to evaluate the vascular anatomy. Carotid stenosis measurements (when applicable) are obtained utilizing NASCET criteria, using the distal internal carotid diameter as the denominator. RADIATION DOSE REDUCTION: This exam was performed according to the departmental dose-optimization program which includes automated exposure control, adjustment of the mA and/or kV according to patient size and/or use of iterative reconstruction technique. CONTRAST:  1mL OMNIPAQUE IOHEXOL 350 MG/ML SOLN COMPARISON:  CT head without contrast 08/18/2020. MR head without contrast 03/23/2017 FINDINGS: CT HEAD FINDINGS Brain: No acute infarct, hemorrhage, or mass lesion is present. No significant white matter lesions are present. The ventricles are of normal size. No significant extraaxial fluid collection is present. The brainstem and cerebellum are within normal limits.  Vascular: No hyperdense vessel or unexpected calcification. Skull: Calvarium is intact. No focal lytic or blastic lesions are present. No significant extracranial soft tissue lesion is present. Sinuses/Orbits: The paranasal sinuses and mastoid air cells are clear. The globes and orbits are within normal limits. Other: Review of the MIP images confirms the above findings CTA NECK FINDINGS Aortic arch: A 3 vessel arch configuration is present. No significant vascular disease is present at the arch. Right carotid system: The right common carotid artery is within normal limits. Bifurcation is unremarkable. Moderate tortuosity is present cervical right ICA without significant stenosis. Left carotid system: The left common carotid artery is within normal limits. Bifurcation is unremarkable. Mild tortuosity is present cervical left ICA without significant stenosis. Vertebral arteries: The left vertebral artery is the dominant vessel. Both vertebral arteries originate from the subclavian arteries. No significant stenosis is present in either vertebral artery in the neck. Skeleton: Vertebral body heights and alignment are normal. No focal osseous lesions are present. Other neck: Soft tissues the neck are otherwise unremarkable. Salivary glands are within normal limits. Thyroid is normal. No significant adenopathy is present. No focal mucosal or submucosal lesions are present. Upper chest: The lung apices are clear. Thoracic inlet is within normal limits. Review of the MIP images confirms the above findings CTA HEAD FINDINGS Anterior circulation: Internal carotid arteries are within normal limits from the skull base through the ICA termini. The A1 and M1 segments are normal. No definite anterior communicating artery present. The MCA bifurcations are within normal limits. ACA and MCA branch vessels are normal. Posterior circulation: The left posterior cerebral artery is the dominant vessel. PICA origins are visualized and  within normal limits. Vertebrobasilar junction and basilar artery are normal. Both posterior cerebral arteries originate from basilar tip. PCA branch vessels are within normal limits bilaterally. Venous sinuses: The dural sinuses are patent. The straight  sinus deep cerebral veins are intact. Cortical veins are within normal limits. No significant vascular malformation is evident. Anatomic variants: None Review of the MIP images confirms the above findings IMPRESSION: 1. Moderate tortuosity of the cervical internal carotid arteries bilaterally without significant stenosis. This is nonspecific, but can be seen in the setting of chronic hypertension. 2. Otherwise normal CTA circle-of-Willis without significant proximal stenosis, aneurysm, or branch vessel occlusion. Electronically Signed   By: San Morelle M.D.   On: 11/02/2021 15:24    EKG: I independently viewed the EKG done and my findings are as followed: Normal sinus rhythm rate of 91.  Nonspecific ST-T changes.  QTc 437.  Assessment/Plan Present on Admission:  Stroke (cerebrum) [facility])  Principal Problem:   Stroke (cerebrum) ([place])  Suspected TIA versus stroke Presented with a headache and sensation of imbalance Last known well around noon on 11/01/2021 Admitted for TIA/stroke work-up Noncontrast CT head nonacute No large vessel occlusion on CT angio head and neck. MRI brain is pending Obtain 2D echo with bubble study Fasting lipid panel, hemoglobin A1c Neurochecks every 4 hours Permissive hypertension until acute ischemic stroke is ruled out PT/OT/speech therapist evaluation Monitor on telemetry Start aspirin 81 mg daily and Lipitor 40 mg daily for now. Rest of management per neurology/stroke team Fall precautions are in place  Chronic anxiety/depression Resume home regimen  Polyneuropathy Resume home regimen. Gabapentin 300 mg 3 times daily.  Situational insomnia Trazodone 50 mg bedtime as needed.  Severe morbid  obesity BMI 47 Recommend weight loss outpatient with regular physical activity and healthy dieting.   DVT prophylaxis: Subcu Lovenox daily  Code Status: Full code  Family Communication: None at bedside  Disposition Plan: Admitted to telemetry medical unit.  Consults called: None  Admission status: Observation status.   Status is: Observation    Kayleen Memos MD [facility] Pager [phone]  If 7PM-7AM, please contact night-coverage [email or web address] Password [email or web address]  11/03/2021, 2:28 AM

## 2021-11-03 NOTE — Progress Notes (Addendum)
STROKE TEAM PROGRESS NOTE   INTERVAL HISTORY No family at the bedside.  Headache started around lunch time on Friday. 1030 Saturday she had taken ibuprofen, tylenol and went to ED. Different than her tension, sinus headaches. Photophobia this time, whole head, nausea,  no vomiting  Residual headache up until 1030 today and now her head just feels sore. Migraine cocktail decreased it to a 3/10.   Vitals:   11/02/21 2142 11/02/21 2300 11/03/21 0021 11/03/21 0351  BP:  101/75 125/85 105/79  Pulse:  81 71 66  Resp:  17 19 16   Temp: 98.1 F (36.7 C) 98.2 F (36.8 C) 97.8 F (36.6 C) 97.7 F (36.5 C)  TempSrc: Oral Oral Oral   SpO2:  95% 94% 99%  Weight:      Height:       CBC:  Recent Labs  Lab 11/02/21 1309 11/03/21 0639  WBC 11.4* 10.2  NEUTROABS 8.3* 7.0  HGB 16.7* 14.5  HCT 50.0* 44.4  MCV 93.5 94.1  PLT 296 240   Basic Metabolic Panel:  Recent Labs  Lab 11/02/21 1309  NA 136  K 4.4  CL 101  CO2 24  GLUCOSE 132*  BUN 11  CREATININE 0.88  CALCIUM 9.3   Lipid Panel: No results for input(s): "CHOL", "TRIG", "HDL", "CHOLHDL", "VLDL", "LDLCALC" in the last 168 hours. HgbA1c: No results for input(s): "HGBA1C" in the last 168 hours. Urine Drug Screen:  Recent Labs  Lab 11/02/21 1309  LABOPIA NONE DETECTED  COCAINSCRNUR NONE DETECTED  LABBENZ POSITIVE*  AMPHETMU NONE DETECTED  THCU NONE DETECTED  LABBARB NONE DETECTED    Alcohol Level  Recent Labs  Lab 11/02/21 1309  ETH <10    IMAGING past 24 hours CT ANGIO HEAD NECK W WO CM  Result Date: 11/02/2021 CLINICAL DATA:  Neuro deficit, acute, stroke suspected. Dizziness. Loss of balance. EXAM: CT ANGIOGRAPHY HEAD AND NECK TECHNIQUE: Multidetector CT imaging of the head and neck was performed using the standard protocol during bolus administration of intravenous contrast. Multiplanar CT image reconstructions and MIPs were obtained to evaluate the vascular anatomy. Carotid stenosis measurements (when applicable)  are obtained utilizing NASCET criteria, using the distal internal carotid diameter as the denominator. RADIATION DOSE REDUCTION: This exam was performed according to the departmental dose-optimization program which includes automated exposure control, adjustment of the mA and/or kV according to patient size and/or use of iterative reconstruction technique. CONTRAST:  78mL OMNIPAQUE IOHEXOL 350 MG/ML SOLN COMPARISON:  CT head without contrast 08/18/2020. MR head without contrast 03/23/2017 FINDINGS: CT HEAD FINDINGS Brain: No acute infarct, hemorrhage, or mass lesion is present. No significant white matter lesions are present. The ventricles are of normal size. No significant extraaxial fluid collection is present. The brainstem and cerebellum are within normal limits. Vascular: No hyperdense vessel or unexpected calcification. Skull: Calvarium is intact. No focal lytic or blastic lesions are present. No significant extracranial soft tissue lesion is present. Sinuses/Orbits: The paranasal sinuses and mastoid air cells are clear. The globes and orbits are within normal limits. Other: Review of the MIP images confirms the above findings CTA NECK FINDINGS Aortic arch: A 3 vessel arch configuration is present. No significant vascular disease is present at the arch. Right carotid system: The right common carotid artery is within normal limits. Bifurcation is unremarkable. Moderate tortuosity is present cervical right ICA without significant stenosis. Left carotid system: The left common carotid artery is within normal limits. Bifurcation is unremarkable. Mild tortuosity is present cervical left ICA  without significant stenosis. Vertebral arteries: The left vertebral artery is the dominant vessel. Both vertebral arteries originate from the subclavian arteries. No significant stenosis is present in either vertebral artery in the neck. Skeleton: Vertebral body heights and alignment are normal. No focal osseous lesions are  present. Other neck: Soft tissues the neck are otherwise unremarkable. Salivary glands are within normal limits. Thyroid is normal. No significant adenopathy is present. No focal mucosal or submucosal lesions are present. Upper chest: The lung apices are clear. Thoracic inlet is within normal limits. Review of the MIP images confirms the above findings CTA HEAD FINDINGS Anterior circulation: Internal carotid arteries are within normal limits from the skull base through the ICA termini. The A1 and M1 segments are normal. No definite anterior communicating artery present. The MCA bifurcations are within normal limits. ACA and MCA branch vessels are normal. Posterior circulation: The left posterior cerebral artery is the dominant vessel. PICA origins are visualized and within normal limits. Vertebrobasilar junction and basilar artery are normal. Both posterior cerebral arteries originate from basilar tip. PCA branch vessels are within normal limits bilaterally. Venous sinuses: The dural sinuses are patent. The straight sinus deep cerebral veins are intact. Cortical veins are within normal limits. No significant vascular malformation is evident. Anatomic variants: None Review of the MIP images confirms the above findings IMPRESSION: 1. Moderate tortuosity of the cervical internal carotid arteries bilaterally without significant stenosis. This is nonspecific, but can be seen in the setting of chronic hypertension. 2. Otherwise normal CTA circle-of-Willis without significant proximal stenosis, aneurysm, or branch vessel occlusion. Electronically Signed   By: Marin Roberts M.D.   On: 11/02/2021 15:24    PHYSICAL EXAM Mental Status: Alert, oriented x 4, speech is fluent, no aphasia or dysarthria  Cranial Nerves: II: Visual fields intact. PERRL  III,IV, VI: No ptosis. EOMI. Eye pain with leftward and upward gaze  V: Facial sensation to light touch is intact and symmetric to face VII: No facial asymmetry  resting or with movement VIII: Hearing intact to voice IX,X: No hoarseness XI: Symmetric shoulder shrug XII: Midline tongue extension Motor: BUE 5/5 proximally and distally BLE 5/5 proximally and distally  No pronator drift.  Sensory: Temp and light touch intact throughout, bilaterally. No extinction to DSS.  Deep Tendon Reflexes: 2+ and symmetric throughout Cerebellar: Right lower extremity ataxia with HKS.  Gait: Deferred  ASSESSMENT/PLAN [name] is a 51 y.o. female with history of severe morbid obesity, essential hypertension, GERD, chronic anxiety/depression, psoriatic arthritis, cervical spondylosis status post arthropathy of cervical facet joint, presenting with headache. Received migraine cocktail in the ED  Having left temporal and left eye pressure headache at a 2-3/10. No visual abnormality. Eye pain with left lateral and upward gaze. Denies diplopia.   Complicated migraine in the setting of recent medication changes with gabapentin  Code Stroke CT head No acute abnormality. Small vessel disease. Atrophy. ASPECTS 10.    CTA head & neck No significant stenosis MRI  pending 2D Echo - pending LDL 78 HgbA1c 6.0 VTE prophylaxis - lovenox    Diet   Diet regular Room service appropriate? Yes; Fluid consistency: Thin   No antithrombotic prior to admission, now on aspirin 81 mg daily.  Therapy recommendations: No recommendations per PT Disposition:  To be discharged home  Hypertension Home meds:  Olmesartan Stable Long-term BP goal normotensive  Hyperlipidemia Home meds:  None LDL 78, goal < 70 Add Atorvastatin 40mg   Continue statin at discharge  Other Stroke  Risk Factors Obesity, Body mass index is 47.42 kg/m., BMI >/= 30 associated with increased stroke risk, recommend weight loss, diet and exercise as appropriate  Obstructive sleep apnea, not on CPAP at home due to mask anxiety  Other Active Problems Anxiety, depression- effexor, wellbutrin OSA-  not on CPAP due to mask intolerance Prediabetes A1c 6%  Dietitian consult placed per patient request Educated on lifestyle modifications, follow up with PCP  Hospital day # 0  -- Anibal Henderson, AGACNP-BC [facility] [phone]  Work-up as above.  Same day note no charge  To contact Stroke Continuity provider, please refer to [email or web address] After hours, contact General Neurology

## 2021-11-03 NOTE — Progress Notes (Addendum)
[facility] PROGRESS NOTE    Progress Note  [name]  JOI:[id number] DOB: 10-Apr-1970 DOA: 11/02/2021 PCP: Chesley Noon, MD     Brief Narrative:   [name] is an 51 y.o. 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 High-dose statins BP goal: permissive HTN upto 220/120 mmHg Telemetry monitoring Unlikely to be a stroke sounds more like migraine headaches we will start abortive therapy.  Chronic depression with anxiety Continue current home regimen.  Polyneuropathy: Continue Neurontin.  Situational insomnia: Continue trazodone.  Leukocytosis: Going back through her chart her white blood cell count has been elevated no cuts or sores. Going to follow-up with hematology as an outpatient for bone marrow biopsy. Check an anemia panel for several months now she has remained afebrile  Morbid (severe) obesity due to excess calories (HCC) Counseling.   DVT prophylaxis: lovenox Family Communication:none Status is: Observation The patient remains OBS appropriate and will d/c before 2 midnights.    Code Status:     Code Status Orders  (From admission, onward)           Start     Ordered   11/03/21 0258  Full code  Continuous        11/03/21 0258           Code Status History     Date  Active Date Inactive Code Status Order ID Comments User Context   02/06/2019 1634 02/10/2019 1759 Full Code [id number]  Truddie Hidden, MD Inpatient   01/24/2019 1158 01/27/2019 1444 Full Code [id number]  Wallace Going, DO Inpatient   09/24/2018 0011 09/25/2018 1853 Full Code [id number]  Vianne Bulls, MD ED   03/24/2017 0451 03/27/2017 1549 Full Code [id number]  Laverle Hobby, PA-C Inpatient   03/23/2017 1953 03/24/2017 0231 Full Code [id number]  Forde Dandy, MD ED   01/09/2016 1733 01/10/2016 1301 Full Code [id number]  Dillingham, [name], DO Inpatient         IV Access:   Peripheral IV   Procedures and diagnostic studies:   CT ANGIO HEAD NECK W WO CM  Result Date: 11/02/2021 CLINICAL DATA:  Neuro deficit, acute, stroke suspected. Dizziness. Loss of balance. EXAM: CT ANGIOGRAPHY HEAD AND NECK TECHNIQUE: Multidetector CT imaging of the head and neck was performed using the standard protocol during bolus administration of intravenous contrast. Multiplanar CT image reconstructions and MIPs were obtained to evaluate the vascular anatomy. Carotid stenosis measurements (when applicable) are obtained utilizing NASCET criteria, using the distal internal carotid diameter as the denominator. RADIATION DOSE REDUCTION: This exam was performed according to the departmental dose-optimization program which includes automated exposure control, adjustment of the mA and/or kV according to patient size and/or use of iterative reconstruction technique. CONTRAST:  60mL OMNIPAQUE IOHEXOL 350 MG/ML SOLN COMPARISON:  CT head without contrast 08/18/2020. MR head without contrast 03/23/2017 FINDINGS: CT HEAD FINDINGS Brain: No acute infarct, hemorrhage, or mass lesion  is present. No significant white matter lesions are present. The ventricles are of normal size. No significant extraaxial fluid collection is present. The brainstem and cerebellum are within normal limits. Vascular: No hyperdense vessel or  unexpected calcification. Skull: Calvarium is intact. No focal lytic or blastic lesions are present. No significant extracranial soft tissue lesion is present. Sinuses/Orbits: The paranasal sinuses and mastoid air cells are clear. The globes and orbits are within normal limits. Other: Review of the MIP images confirms the above findings CTA NECK FINDINGS Aortic arch: A 3 vessel arch configuration is present. No significant vascular disease is present at the arch. Right carotid system: The right common carotid artery is within normal limits. Bifurcation is unremarkable. Moderate tortuosity is present cervical right ICA without significant stenosis. Left carotid system: The left common carotid artery is within normal limits. Bifurcation is unremarkable. Mild tortuosity is present cervical left ICA without significant stenosis. Vertebral arteries: The left vertebral artery is the dominant vessel. Both vertebral arteries originate from the subclavian arteries. No significant stenosis is present in either vertebral artery in the neck. Skeleton: Vertebral body heights and alignment are normal. No focal osseous lesions are present. Other neck: Soft tissues the neck are otherwise unremarkable. Salivary glands are within normal limits. Thyroid is normal. No significant adenopathy is present. No focal mucosal or submucosal lesions are present. Upper chest: The lung apices are clear. Thoracic inlet is within normal limits. Review of the MIP images confirms the above findings CTA HEAD FINDINGS Anterior circulation: Internal carotid arteries are within normal limits from the skull base through the ICA termini. The A1 and M1 segments are normal. No definite anterior communicating artery present. The MCA bifurcations are within normal limits. ACA and MCA branch vessels are normal. Posterior circulation: The left posterior cerebral artery is the dominant vessel. PICA origins are visualized and within normal limits. Vertebrobasilar  junction and basilar artery are normal. Both posterior cerebral arteries originate from basilar tip. PCA branch vessels are within normal limits bilaterally. Venous sinuses: The dural sinuses are patent. The straight sinus deep cerebral veins are intact. Cortical veins are within normal limits. No significant vascular malformation is evident. Anatomic variants: None Review of the MIP images confirms the above findings IMPRESSION: 1. Moderate tortuosity of the cervical internal carotid arteries bilaterally without significant stenosis. This is nonspecific, but can be seen in the setting of chronic hypertension. 2. Otherwise normal CTA circle-of-Willis without significant proximal stenosis, aneurysm, or branch vessel occlusion. Electronically Signed   By: San Morelle M.D.   On: 11/02/2021 15:24     Medical Consultants:   None.   Subjective:    [name] still having headaches.  Objective:    Vitals:   11/02/21 2142 11/02/21 2300 11/03/21 0021 11/03/21 0351  BP:  101/75 125/85 105/79  Pulse:  81 71 66  Resp:  $Remo'17 19 16  '[place]$ Temp: 98.1 F (36.7 C) 98.2 F (36.8 C) 97.8 F (36.6 C) 97.7 F (36.5 C)  TempSrc: Oral Oral Oral   SpO2:  95% 94% 99%  Weight:      Height:       SpO2: 99 %   Intake/Output Summary (Last 24 hours) at 11/03/2021 0705 Last data filed at 11/03/2021 0100 Gross per 24 hour  Intake 1073.05 ml  Output --  Net 1073.05 ml   Filed Weights   11/02/21 1300  Weight: (!) 154.2 kg    Exam: General exam: In no acute distress. Respiratory system: Good air movement and  clear to auscultation. Cardiovascular system: S1 & S2 heard, RRR. No JVD. Gastrointestinal system: Abdomen is nondistended, soft and nontender.  Extremities: No pedal edema. Skin: No rashes, lesions or ulcers Psychiatry: Judgement and insight appear normal. Mood & affect appropriate.    Data Reviewed:    Labs: Basic Metabolic Panel: Recent Labs  Lab 11/02/21 1309  NA 136  K 4.4   CL 101  CO2 24  GLUCOSE 132*  BUN 11  CREATININE 0.88  CALCIUM 9.3   GFR Estimated Creatinine Clearance: 123 mL/min (by C-G formula based on SCr of 0.88 mg/dL). Liver Function Tests: Recent Labs  Lab 11/02/21 1309  AST 19  ALT 14  ALKPHOS 68  BILITOT 0.6  PROT 7.9  ALBUMIN 4.4   No results for input(s): "LIPASE", "AMYLASE" in the last 168 hours. No results for input(s): "AMMONIA" in the last 168 hours. Coagulation profile Recent Labs  Lab 11/02/21 1309  INR 1.0   COVID-19 Labs  No results for input(s): "DDIMER", "FERRITIN", "LDH", "CRP" in the last 72 hours.  Lab Results  Component Value Date   SARSCOV2NAA NEGATIVE 04/15/2021   [place] NEGATIVE 08/18/2020   [place] NEGATIVE 06/18/2019   [place] NEGATIVE 02/06/2019    CBC: Recent Labs  Lab 11/02/21 1309  WBC 11.4*  NEUTROABS 8.3*  HGB 16.7*  HCT 50.0*  MCV 93.5  PLT 296   Cardiac Enzymes: No results for input(s): "CKTOTAL", "CKMB", "CKMBINDEX", "TROPONINI" in the last 168 hours. BNP (last 3 results) No results for input(s): "PROBNP" in the last 8760 hours. CBG: Recent Labs  Lab 11/02/21 1304 11/03/21 0614  GLUCAP 148* 125*   D-Dimer: No results for input(s): "DDIMER" in the last 72 hours. Hgb A1c: No results for input(s): "HGBA1C" in the last 72 hours. Lipid Profile: No results for input(s): "CHOL", "HDL", "LDLCALC", "TRIG", "CHOLHDL", "LDLDIRECT" in the last 72 hours. Thyroid function studies: No results for input(s): "TSH", "T4TOTAL", "T3FREE", "THYROIDAB" in the last 72 hours.  Invalid input(s): "FREET3" Anemia work up: No results for input(s): "VITAMINB12", "FOLATE", "FERRITIN", "TIBC", "IRON", "RETICCTPCT" in the last 72 hours. Sepsis Labs: Recent Labs  Lab 11/02/21 1309  WBC 11.4*   Microbiology No results found for this or any previous visit (from the past 240 hour(s)).   Medications:    aspirin EC  81 mg Oral Daily   atorvastatin  40 mg Oral Daily    enoxaparin (LOVENOX) injection  75 mg Subcutaneous Q24H   gabapentin  300 mg Oral TID   mirtazapine  15 mg Oral QHS   Continuous Infusions:    LOS: 0 days   Charlynne Cousins  [facility]  11/03/2021, 7:05 AM

## 2021-11-03 NOTE — Consult Note (Signed)
NEURO HOSPITALIST CONSULT NOTE   Requestig physician: Dr. David Stall  Reason for Consult: Dysarthria and left hemiataxia in the setting of severe headache  History obtained from:  Patient and Chart     HPI:                                                                                                                                          [name] is an 51 y.o. female with a PMHx of anxiety, arthritis, asthma, major depressive disorder, history of suicide attempt in 2018, HTN and psoriasis who presented to the [facility] ED on Saturday afternoon with a c/c of headache that had started on Friday at about noon. The headache was accompanied by balance issues and dizziness. She also had felt at that time that her left eye felt droopy. She had some nausea but no emesis. Headache was holocephalic but worse on the left side in the parietal occipital region, throbbing "like a jackhammer" and rated as 8/10 at its worst, with associated photophobia but without osmophobia or sonophobia. She was afebrile with BP of 125/85 and normal O2 sats. Exam by EDP revealed left upper extremity dysmetria, raising concern for CVA. CTA of head and neck revealed moderate tortuosity of the cervical internal carotid arteries bilaterally without significant stenosis and was otherwise unremarkable.   Headache cocktail was administered in the ED.   [facility] for stroke work up to include MRI brain.   Past Medical History:  Diagnosis Date   Anxiety    Arthritis    joints   Asthma    Endometriosis    Fat necrosis of breast    right   GERD (gastroesophageal reflux disease)    History of hiatal hernia    s/p repair few times   History of suicide attempt 03/2017   Hypertension    MDD (major depressive disorder)    Panniculitis    Pneumonia 10/2018   in hospital with pneumonia   PONV (postoperative nausea and vomiting)    Psoriasis    treated with Humeria    Past Surgical  History:  Procedure Laterality Date   ACHILLES TENDON REPAIR Left 01-11-2015  @[facility]    BREAST CYST EXCISION Right 01/24/2019   Procedure: EXCISIN OF RIGHT BREAST FAT NECROSIS;  Surgeon: 01/26/2019, DO;  Location: [facility];  Service: Plastics;  Laterality: Right;   BREAST REDUCTION SURGERY Bilateral 01/09/2016   Procedure: MAMMARY REDUCTION  (BREAST) BILATERAL;  Surgeon: 03/10/2016, DO;  Location: [facility];  Service: Plastics;  Laterality: Bilateral;   CARPOMETACARPAL (CMC) FUSION OF THUMB Right 05-04-2015   @[facility]    COMBINED HYSTEROSCOPY DIAGNOSTIC / D&C  06-23-2003     dr @[facility]    D & C HYSTERSCOPY /  DX LAPAROSCOPY CONVERSION LAPAROTOMY LYSIS ADHESIONS WITH RIGHT SALPINECTOMY  12-26-2003    dr Edward Jollysilva  @[facility]    DEBRIDEMENT AND CLOSURE WOUND N/A 02/07/2019   Procedure: DEBRIDEMENT OF ABDOMINAL WOUND, PLACEMENT OF A-CELL, PLACEMENT OF WOUND VAC;  Surgeon: Peggye Formillingham, Claire S, DO;  Location: [facility];  Service: Plastics;  Laterality: N/A;   INCISION AND DRAINAGE OF WOUND Right 06/22/2019   Procedure: Excision of abdominal wound with closure;  Surgeon: Peggye Formillingham, Claire S, DO;  Location: [facility];  Service: Plastics;  Laterality: Right;  Total case time is 1 hour   LAPAROSCOPIC CHOLECYSTECTOMY  10-19-2002   dr Orson Slickbowman  @[facility]    LAPAROSCOPIC GASTRIC BANDING  09-25-2003  @[facility]    WITH HIATAL HERNIA REPAIR   LAPAROSCOPIC LYSIS OF ADHESIONS N/A 01/24/2019   Procedure: LAPAROSCOPY WITH LYSIS OF ADHESIONS;  Surgeon: Luretha MurphyMartin, Matthew, MD;  Location: [facility];  Service: General;  Laterality: N/A;   LAPAROSCOPIC REPAIR AND REMOVAL OF GASTRIC BAND  02-06-2006  @[facility]    LAPAROTOMY LYSIS ADHESIONS , UTERINE BIOPSY  01-31-2000   dr lowe  @[facility]    OPEN WOUND DEBRIDEMENT ANTERIOR ABDOMINAL WALL , REMOVAL FORGEIN BODY'S  05-10-2001   dr Orson Slickbowman  @[facility]    PANNICULECTOMY N/A 01/24/2019   Procedure: PANNICULECTOMY;  Surgeon: Peggye Formillingham, Claire S, DO;  Location: [facility];  Service:  Plastics;  Laterality: N/A;   REPAIR RECURRENT  HIATAL HERNIA   04-23-2004  @[facility]    TAKEDOWN RECURRENT HIATAL HERNIA FROM PREVIOUS MESH REPAIR OF DIAPHRAGM/ REPAIR HIATAL HERNIA AND THE GASTROPEXY  11/05/2004   TONSILLECTOMY  age 57   TOTAL ABDOMINAL HYSTERECTOMY W/ BILATERAL SALPINGOOPHORECTOMY  12-23-2005   dr soper   W/  BILATERAL URETEROLYSIS AND EXTENSIVE ABD. LYSIS ADHESIONS   UMBILICAL HERNIA REPAIR  12/27/2009   LAPAROSCOPY TAKEDOWN INCARCERATED UMBILICAL HERNIA WITH REPAIR/ OPEN REPAIR COMPLEX LOWER INCISIONAL HERNIA   VAGOTOMY  2006    Family History  Problem Relation Age of Onset   Cancer Maternal Aunt        melanoma   Diabetes Father    Heart attack Father [id number]             Social History:  reports that she has been smoking cigarettes. She has a 20.00 pack-year smoking history. She has never used smokeless tobacco. She reports current alcohol use. She reports that she does not use drugs.  Allergies  Allergen Reactions   Adhesive [Tape]     Rash, blisters   Influenza Vaccines     States her "arm had a knot and turned red"   Lisinopril-Hydrochlorothiazide     welts    MEDICATIONS:                                                                                                                     Prior to Admission:  Medications Prior to Admission  Medication Sig Dispense Refill Last Dose   acetaminophen (TYLENOL) 500 MG tablet Take 1,000 mg by mouth every 4 (four) hours as needed for fever  or headache.      albuterol (VENTOLIN HFA) 108 (90 Base) MCG/ACT inhaler Inhale 1-2 puffs into the lungs every 6 (six) hours as needed for wheezing or shortness of breath. 6.7 g 0    ALPRAZolam (XANAX) 0.5 MG tablet Take 0.5 mg by mouth 3 (three) times daily as needed for anxiety.       azelastine (ASTELIN) 137 MCG/SPRAY nasal spray Place 2 sprays into both nostrils 2 (two) times daily as needed for rhinitis. Use in each nostril as directed      benzonatate (TESSALON) 100 MG capsule Take  1 capsule (100 mg total) by mouth 3 (three) times daily as needed for cough. 21 capsule 0    buPROPion (WELLBUTRIN XL) 300 MG 24 hr tablet Take 300 mg by mouth daily.      cephALEXin (KEFLEX) 500 MG capsule Take 1 capsule (500 mg total) by mouth 4 (four) times daily. 28 capsule 0    cetirizine (ZYRTEC) 10 MG tablet Take 10 mg by mouth daily as needed for allergies.       collagenase (SANTYL) ointment Apply 1 application topically every other day. Apply to right and left abdominal wounds every other day. 15 g 0    gabapentin (NEURONTIN) 300 MG capsule Take 300 mg by mouth 3 (three) times daily.      HYDROcodone-acetaminophen (NORCO/VICODIN) 5-325 MG tablet Take 1 tablet by mouth every 4 (four) hours as needed. 10 tablet 0    Lifitegrast (XIIDRA) 5 % SOLN Apply to eye.      methocarbamol (ROBAXIN) 500 MG tablet Take 1 tablet (500 mg total) by mouth every 8 (eight) hours as needed for muscle spasms. 8 tablet 0    mirtazapine (REMERON) 15 MG tablet Take 1 tablet (15 mg total) by mouth at bedtime. For mood control 30 tablet 0    montelukast (SINGULAIR) 10 MG tablet Take 10 mg by mouth at bedtime.       olmesartan (BENICAR) 40 MG tablet Take 40 mg by mouth every morning.       pantoprazole (PROTONIX) 40 MG tablet Take 40 mg by mouth 2 (two) times daily.       polyethylene glycol (MIRALAX / GLYCOLAX) 17 g packet Take 17 g by mouth daily as needed for mild constipation. 14 each 0    promethazine (PHENERGAN) 25 MG tablet Take 25 mg by mouth every 8 (eight) hours as needed for nausea or vomiting.       senna (SENOKOT) 8.6 MG tablet Take 1 tablet by mouth daily as needed for constipation.      traZODone (DESYREL) 50 MG tablet Take 1 tablet (50 mg total) by mouth at bedtime as needed for sleep. For sleep (Patient taking differently: Take 50 mg by mouth at bedtime. For sleep) 30 tablet 0    venlafaxine XR (EFFEXOR-XR) 150 MG 24 hr capsule Take 150 mg by mouth daily with breakfast.       Scheduled:  aspirin EC   81 mg Oral Daily   atorvastatin  40 mg Oral Daily   enoxaparin (LOVENOX) injection  75 mg Subcutaneous Q24H   gabapentin  300 mg Oral TID   mirtazapine  15 mg Oral QHS    ROS:  Still with mild headache rated at 2/10. No complaint of dysarthria or ataxia at time of exam. Other ROS as per HPI.    Blood pressure 105/79, pulse 66, temperature 97.7 F (36.5 C), resp. rate 16, height 5\' 11"  (1.803 m), weight (!) 154.2 kg, SpO2 99 %.   General Examination:                                                                                                       Physical Exam  HEENT-  [place]/AT  Lungs: Respirations unlabored Extremities- No edema   Neurological Examination Mental Status: Alert, oriented x 5, thought content appropriate.  Speech fluent without evidence of aphasia.  No dysarthria. Able to follow all commands without difficulty. Cranial Nerves: II: Temporal visual fields intact with no extinction to DSS. PERRL  III,IV, VI: No ptosis. EOMI.  V: Temp sensation equal bilaterally  VII: Smile symmetric VIII: Hearing intact to voice IX,X: No hoarseness XI: Symmetric shoulder shrug XII: Midline tongue extension Motor: BUE 5/5 proximally and distally BLE 5/5 proximally and distally  No pronator drift.  Sensory: Temp and light touch intact throughout, bilaterally. No extinction to DSS.  Deep Tendon Reflexes: 2+ and symmetric throughout Cerebellar: No ataxia with FNF or H-S bilaterally  Gait: Deferred  Lab Results: Basic Metabolic Panel: Recent Labs  Lab 11/02/21 1309  NA 136  K 4.4  CL 101  CO2 24  GLUCOSE 132*  BUN 11  CREATININE 0.88  CALCIUM 9.3    CBC: Recent Labs  Lab 11/02/21 1309  WBC 11.4*  NEUTROABS 8.3*  HGB 16.7*  HCT 50.0*  MCV 93.5  PLT 296    Cardiac Enzymes: No results for input(s): "CKTOTAL", "CKMB",  "CKMBINDEX", "TROPONINI" in the last 168 hours.  Lipid Panel: No results for input(s): "CHOL", "TRIG", "HDL", "CHOLHDL", "VLDL", "LDLCALC" in the last 168 hours.  Imaging: CT ANGIO HEAD NECK W WO CM  Result Date: 11/02/2021 CLINICAL DATA:  Neuro deficit, acute, stroke suspected. Dizziness. Loss of balance. EXAM: CT ANGIOGRAPHY HEAD AND NECK TECHNIQUE: Multidetector CT imaging of the head and neck was performed using the standard protocol during bolus administration of intravenous contrast. Multiplanar CT image reconstructions and MIPs were obtained to evaluate the vascular anatomy. Carotid stenosis measurements (when applicable) are obtained utilizing NASCET criteria, using the distal internal carotid diameter as the denominator. RADIATION DOSE REDUCTION: This exam was performed according to the departmental dose-optimization program which includes automated exposure control, adjustment of the mA and/or kV according to patient size and/or use of iterative reconstruction technique. CONTRAST:  4mL OMNIPAQUE IOHEXOL 350 MG/ML SOLN COMPARISON:  CT head without contrast 08/18/2020. MR head without contrast 03/23/2017 FINDINGS: CT HEAD FINDINGS Brain: No acute infarct, hemorrhage, or mass lesion is present. No significant white matter lesions are present. The ventricles are of normal size. No significant extraaxial fluid collection is present. The brainstem and cerebellum are within normal limits. Vascular: No hyperdense vessel or unexpected calcification. Skull: Calvarium is intact. No focal lytic or blastic lesions are present. No significant extracranial soft tissue lesion is present. Sinuses/Orbits:  The paranasal sinuses and mastoid air cells are clear. The globes and orbits are within normal limits. Other: Review of the MIP images confirms the above findings CTA NECK FINDINGS Aortic arch: A 3 vessel arch configuration is present. No significant vascular disease is present at the arch. Right carotid system:  The right common carotid artery is within normal limits. Bifurcation is unremarkable. Moderate tortuosity is present cervical right ICA without significant stenosis. Left carotid system: The left common carotid artery is within normal limits. Bifurcation is unremarkable. Mild tortuosity is present cervical left ICA without significant stenosis. Vertebral arteries: The left vertebral artery is the dominant vessel. Both vertebral arteries originate from the subclavian arteries. No significant stenosis is present in either vertebral artery in the neck. Skeleton: Vertebral body heights and alignment are normal. No focal osseous lesions are present. Other neck: Soft tissues the neck are otherwise unremarkable. Salivary glands are within normal limits. Thyroid is normal. No significant adenopathy is present. No focal mucosal or submucosal lesions are present. Upper chest: The lung apices are clear. Thoracic inlet is within normal limits. Review of the MIP images confirms the above findings CTA HEAD FINDINGS Anterior circulation: Internal carotid arteries are within normal limits from the skull base through the ICA termini. The A1 and M1 segments are normal. No definite anterior communicating artery present. The MCA bifurcations are within normal limits. ACA and MCA branch vessels are normal. Posterior circulation: The left posterior cerebral artery is the dominant vessel. PICA origins are visualized and within normal limits. Vertebrobasilar junction and basilar artery are normal. Both posterior cerebral arteries originate from basilar tip. PCA branch vessels are within normal limits bilaterally. Venous sinuses: The dural sinuses are patent. The straight sinus deep cerebral veins are intact. Cortical veins are within normal limits. No significant vascular malformation is evident. Anatomic variants: None Review of the MIP images confirms the above findings IMPRESSION: 1. Moderate tortuosity of the cervical internal carotid  arteries bilaterally without significant stenosis. This is nonspecific, but can be seen in the setting of chronic hypertension. 2. Otherwise normal CTA circle-of-Willis without significant proximal stenosis, aneurysm, or branch vessel occlusion. Electronically Signed   By: Marin Roberts M.D.   On: 11/02/2021 15:24     Assessment: 51 year old female presenting with transient dysarthria and left hemiataxia in the setting of severe headache 1. Exam is nonfocal. Headache has decreased from 8/10 initially to 2/10 at the time of Neurology evaluation.  2. CTA of head and neck:  Moderate tortuosity of the cervical internal carotid arteries bilaterally without significant stenosis. This is nonspecific, but can be seen in the setting of chronic hypertension. Otherwise normal CTA circle-of-Willis without significant proximal stenosis, aneurysm, or branch vessel occlusion. 3. DDx: - Most likely component of the DDx is felt to be complicated migraine. Patient denies a prior history of migraines although she does endorse having had severe headache in the past.  - Also on DDx is a small stroke or TIA.  - No symptoms to suggest meningitis.  - No recent trauma.  - CT/CTA without evidence for hemorrhage or aneurysm.   Recommendations: 1. Agree with starting ASA 2. TTE pending 3. MRI brain pending 4. Cardiac telemetry.  5. BP management 6. IV phenergan 12.5-25 mg IV PRN worsened headache 7. Encourage PO hydration 8. PT/OT/Speech 9. HgbA1c, fasting lipid panel 10. Frequent neuro checks   Electronically signed: Dr. Caryl Pina 11/03/2021, 6:41 AM

## 2021-11-03 NOTE — Evaluation (Signed)
Occupational Therapy Evaluation Patient Details Name: [name] MRN: [id number] DOB: [birth year] Today's Date: 11/03/2021   History of Present Illness [name] is a 51 y.o. female who presented to [facility] as a direct admit from [facility] ED due to concern for TIA/stroke wtih symptoms of sensation of being imbalanced, mild nausea and photophobia.  Noncontrast CT head in the ED with no acute intracranial abnormality. MRI pending. Pt with medical history significant for severe morbid obesity, essential hypertension, GERD, chronic anxiety/depression, psoriatic arthritis, cervical spondylosis status post arthropathy of cervical facet joint   Clinical Impression   [name] was evaluated s/p the above admission list, she is typically indep at baseline and cares for her mother who lives with her. Pt drives but does not work. Upon evaluation pt endorsed a 5/10 headache and was a little unsteady upon standing, no LOB. However once up and ambulating the in the room without AD and completing ADLs, pt demonstrated mod I capability. She does not required further acute OT, or follow up OT at d/c. Recommend d/c to home with assist from family as needed.      Recommendations for follow up therapy are one component of a multi-disciplinary discharge planning process, led by the attending physician.  Recommendations may be updated based on patient status, additional functional criteria and insurance authorization.   Follow Up Recommendations  No OT follow up    Assistance Recommended at Discharge PRN  Patient can return home with the following Assist for transportation    Functional Status Assessment  Patient has had a recent decline in their functional status and demonstrates the ability to make significant improvements in function in a reasonable and predictable amount of time.  Equipment Recommendations  None recommended by OT    Recommendations for Other Services       Precautions /  Restrictions Precautions Precautions: Fall Restrictions Weight Bearing Restrictions: No      Mobility Bed Mobility Overal bed mobility: Modified Independent                  Transfers Overall transfer level: Needs assistance Equipment used: None Transfers: Sit to/from Stand Sit to Stand: Modified independent (Device/Increase time)                  Balance Overall balance assessment: Mild deficits observed, not formally tested                                         ADL either performed or assessed with clinical judgement   ADL Overall ADL's : Needs assistance/impaired                                     Functional mobility during ADLs: Supervision/safety General ADL Comments: Supervision provided however pt demonstrated mod I ability wtih increased time for safety.     Vision Baseline Vision/History: 1 Wears glasses Vision Assessment?: No apparent visual deficits     Perception     Praxis      Pertinent Vitals/Pain Pain Assessment Pain Assessment: 0-10 Pain Score: 5  Pain Location: head Pain Descriptors / Indicators: Headache Pain Intervention(s): Limited activity within patient's tolerance, Monitored during session     Hand Dominance Right   Extremity/Trunk Assessment Upper Extremity Assessment Upper Extremity Assessment: Overall WFL for tasks assessed   Lower  Extremity Assessment Lower Extremity Assessment: Defer to PT evaluation   Cervical / Trunk Assessment Cervical / Trunk Assessment: Normal   Communication Communication Communication: No difficulties   Cognition Arousal/Alertness: Awake/alert Behavior During Therapy: WFL for tasks assessed/performed Overall Cognitive Status: Within Functional Limits for tasks assessed                                       General Comments  VSS on RA, pt reports    Exercises     Shoulder Instructions      Home Living Family/patient expects  to be discharged to:: Private residence Living Arrangements: Parent Available Help at Discharge: Family;Available PRN/intermittently Type of Home: House Home Access: Stairs to enter [employer] of Steps: 5 Entrance Stairs-Rails: Right;Left Home Layout: One level     Bathroom Shower/Tub: Tub/shower unit;Walk-in Human resources officer: Standard     Home Equipment: Shower seat;BSC/3in1   Additional Comments: Mom lives with pt, pt assists her mother with IADLs and bathing      Prior Functioning/Environment Prior Level of Function : Independent/Modified Independent             Mobility Comments: no AD ADLs Comments: does not work, drives. Takes care of mother.        OT Problem List: Decreased activity tolerance;Pain      OT Treatment/Interventions:      OT Goals(Current goals can be found in the care plan section) Acute Rehab OT Goals Patient Stated Goal: no headache OT Goal Formulation: All assessment and education complete, DC therapy Time For Goal Achievement: 11/17/21 Potential to Achieve Goals: Good  OT Frequency:      Co-evaluation              AM-PAC OT "6 Clicks" Daily Activity     Outcome Measure Help from another person eating meals?: None Help from another person taking care of personal grooming?: None Help from another person toileting, which includes using toliet, bedpan, or urinal?: None Help from another person bathing (including washing, rinsing, drying)?: None Help from another person to put on and taking off regular upper body clothing?: None Help from another person to put on and taking off regular lower body clothing?: None 6 Click Score: 24   End of Session Equipment Utilized During Treatment: Gait belt Nurse Communication: Mobility status  Activity Tolerance: Patient tolerated treatment well Patient left: in chair;with call bell/phone within reach;with chair alarm set  OT Visit Diagnosis: Pain Pain - part of body:   (head)                Time: 5625-6389 OT Time Calculation (min): 20 min Charges:  OT General Charges $OT Visit: 1 Visit OT Evaluation $OT Eval Low Complexity: 1 Low    Noraa Pickeral A Nelwyn Hebdon 11/03/2021, 9:10 AM

## 2021-11-03 NOTE — Progress Notes (Signed)
PT Cancellation Note  Patient Details Name: [name] MRN: [id number] DOB: 01-18-1971   Cancelled Treatment:    Reason Eval/Treat Not Completed: PT screened, no needs identified, will sign off. Per OT, no skilled PT needs. Pt reports she is at her baseline for mobility.    Ilda Foil 11/03/2021, 8:33 AM

## 2021-11-04 ENCOUNTER — Observation Stay (HOSPITAL_BASED_OUTPATIENT_CLINIC_OR_DEPARTMENT_OTHER): Payer: Medicare HMO

## 2021-11-04 DIAGNOSIS — I639 Cerebral infarction, unspecified: Secondary | ICD-10-CM | POA: Diagnosis not present

## 2021-11-04 DIAGNOSIS — R519 Headache, unspecified: Secondary | ICD-10-CM | POA: Diagnosis not present

## 2021-11-04 DIAGNOSIS — I6389 Other cerebral infarction: Secondary | ICD-10-CM

## 2021-11-04 LAB — ECHOCARDIOGRAM COMPLETE BUBBLE STUDY
AR max vel: 3.27 cm2
AV Area VTI: 3.28 cm2
AV Area mean vel: 3.28 cm2
AV Mean grad: 4 mmHg
AV Peak grad: 7.3 mmHg
Ao pk vel: 1.35 m/s
Area-P 1/2: 3.1 cm2
S' Lateral: 2.6 cm

## 2021-11-04 MED ORDER — SUMATRIPTAN 20 MG/ACT NA SOLN: 1.0000 | NASAL | 2 refills | Status: DC | PRN

## 2021-11-04 MED ORDER — ATORVASTATIN CALCIUM 40 MG PO TABS: 40.0000 mg | ORAL_TABLET | Freq: Every day | ORAL | 3 refills | Status: AC

## 2021-11-04 NOTE — Progress Notes (Signed)
Discharge instructions discussed with patient. All questions answered. Iv removed, telemetry discontinued. Patient belongings returned. Patient wheeled off unit for transport home.  Melony Overly, RN

## 2021-11-04 NOTE — Progress Notes (Signed)
STROKE TEAM PROGRESS NOTE   INTERVAL HISTORY No family at the bedside.  Patient is sitting up in bed ready to go home.  She does give a history of headaches and vision disturbance in the past which may suggest migraine even though she is does not have a formal diagnosis.  Current episode did include headache and photophobia along with some slurred speech and left ataxia.  MRI is negative for acute stroke and CT angiograms of the brain and neck showed no significant large vessel stenosis or occlusion. Vitals:   11/03/21 1826 11/03/21 2000 11/04/21 0042 11/04/21 0404  BP: 110/80 102/78 (!) 101/59 (!) 101/52  Pulse: 75 94 94 76  Resp:   18   Temp: 98.2 F (36.8 C) 98.2 F (36.8 C) 98.3 F (36.8 C) 98.2 F (36.8 C)  TempSrc: Oral  Oral Oral  SpO2: 96% 96% 90% 93%  Weight:      Height:       CBC:  Recent Labs  Lab 11/02/21 1309 11/03/21 0639  WBC 11.4* 10.2  NEUTROABS 8.3* 7.0  HGB 16.7* 14.5  HCT 50.0* 44.4  MCV 93.5 94.1  PLT 296 240   Basic Metabolic Panel:  Recent Labs  Lab 11/02/21 1309 11/03/21 1028  NA 136 137  K 4.4 3.7  CL 101 106  CO2 24 25  GLUCOSE 132* 165*  BUN 11 9  CREATININE 0.88 0.86  CALCIUM 9.3 8.8*  MG  --  1.9  PHOS  --  3.0   Lipid Panel:  Recent Labs  Lab 11/03/21 0639  CHOL 149  TRIG 134  HDL 44  CHOLHDL 3.4  VLDL 27  LDLCALC 78   HgbA1c:  Recent Labs  Lab 11/03/21 0639  HGBA1C 6.0*   Urine Drug Screen:  Recent Labs  Lab 11/02/21 1309  LABOPIA NONE DETECTED  COCAINSCRNUR NONE DETECTED  LABBENZ POSITIVE*  AMPHETMU NONE DETECTED  THCU NONE DETECTED  LABBARB NONE DETECTED    Alcohol Level  Recent Labs  Lab 11/02/21 1309  ETH <10    IMAGING past 24 hours ECHOCARDIOGRAM COMPLETE BUBBLE STUDY  Result Date: 11/04/2021    ECHOCARDIOGRAM REPORT   Patient Name:   [name] Date of Exam: 11/04/2021 Medical Rec #:  [id number]        Height:       71.0 in Accession #:    [id number]       Weight:       340.0 lb Date of  Birth:  1970-06-29        BSA:          2.642 m Patient Age:    51 years         BP:           101/52 mmHg Patient Gender: F                HR:           89 bpm. Exam Location:  Inpatient Procedure: 2D Echo, Cardiac Doppler, Color Doppler and Saline Contrast Bubble            Study Indications:    Suspected CVA  History:        Patient has no prior history of Echocardiogram examinations.                 Risk Factors:Hypertension. GERD.  Sonographer:    Ross Ludwig RDCS (AE) Referring Phys: [id number] Darlin Drop  Sonographer Comments: Patient is morbidly obese  and suboptimal subcostal window. IMPRESSIONS  1. Left ventricular ejection fraction, by estimation, is 60 to 65%. The left ventricle has normal function. The left ventricle has no regional wall motion abnormalities. There is mild concentric left ventricular hypertrophy. Left ventricular diastolic parameters were normal.  2. Right ventricular systolic function is normal. The right ventricular size is normal. Tricuspid regurgitation signal is inadequate for assessing PA pressure.  3. The mitral valve is normal in structure. Trivial mitral valve regurgitation.  4. The aortic valve is tricuspid. Aortic valve regurgitation is not visualized. No aortic stenosis is present.  5. The inferior vena cava is normal in size with greater than 50% respiratory variability, suggesting right atrial pressure of 3 mmHg.  6. Agitated saline contrast bubble study was negative, with no evidence of any interatrial shunt. Comparison(s): No prior Echocardiogram. Conclusion(s)/Recommendation(s): No intracardiac source of embolism detected on this transthoracic study. Consider a transesophageal echocardiogram to exclude cardiac source of embolism if clinically indicated. FINDINGS  Left Ventricle: Left ventricular ejection fraction, by estimation, is 60 to 65%. The left ventricle has normal function. The left ventricle has no regional wall motion abnormalities. The left ventricular internal  cavity size was normal in size. There is  mild concentric left ventricular hypertrophy. Left ventricular diastolic parameters were normal. Right Ventricle: The right ventricular size is normal. No increase in right ventricular wall thickness. Right ventricular systolic function is normal. Tricuspid regurgitation signal is inadequate for assessing PA pressure. Left Atrium: Left atrial size was normal in size. Right Atrium: Right atrial size was normal in size. Pericardium: There is no evidence of pericardial effusion. Mitral Valve: The mitral valve is normal in structure. Trivial mitral valve regurgitation. Tricuspid Valve: The tricuspid valve is normal in structure. Tricuspid valve regurgitation is not demonstrated. Aortic Valve: The aortic valve is tricuspid. Aortic valve regurgitation is not visualized. No aortic stenosis is present. Aortic valve mean gradient measures 4.0 mmHg. Aortic valve peak gradient measures 7.3 mmHg. Aortic valve area, by VTI measures 3.28 cm. Pulmonic Valve: The pulmonic valve was not well visualized. Pulmonic valve regurgitation is not visualized. Aorta: The aortic root is normal in size and structure. Venous: The inferior vena cava is normal in size with greater than 50% respiratory variability, suggesting right atrial pressure of 3 mmHg. IAS/Shunts: The atrial septum is grossly normal. Agitated saline contrast was given intravenously to evaluate for intracardiac shunting. Agitated saline contrast bubble study was negative, with no evidence of any interatrial shunt.  LEFT VENTRICLE PLAX 2D LVIDd:         3.80 cm   Diastology LVIDs:         2.60 cm   LV e' medial:    8.38 cm/s LV PW:         0.90 cm   LV E/e' medial:  10.3 LV IVS:        1.10 cm   LV e' lateral:   10.20 cm/s LVOT diam:     2.20 cm   LV E/e' lateral: 8.4 LV SV:         81 LV SV Index:   31 LVOT Area:     3.80 cm  RIGHT VENTRICLE RV Basal diam:  3.10 cm RV S prime:     12.40 cm/s TAPSE (M-mode): 2.6 cm LEFT ATRIUM              Index        RIGHT ATRIUM           Index LA  diam:        2.30 cm 0.87 cm/m   RA Area:     13.40 cm LA Vol (A2C):   33.3 ml 12.60 ml/m  RA Volume:   31.90 ml  12.07 ml/m LA Vol (A4C):   59.5 ml 22.52 ml/m LA Biplane Vol: 47.1 ml 17.83 ml/m  AORTIC VALVE AV Area (Vmax):    3.27 cm AV Area (Vmean):   3.28 cm AV Area (VTI):     3.28 cm AV Vmax:           135.00 cm/s AV Vmean:          90.500 cm/s AV VTI:            0.247 m AV Peak Grad:      7.3 mmHg AV Mean Grad:      4.0 mmHg LVOT Vmax:         116.00 cm/s LVOT Vmean:        78.100 cm/s LVOT VTI:          0.213 m LVOT/AV VTI ratio: 0.86  AORTA Ao Root diam: 3.20 cm Ao Asc diam:  3.10 cm MITRAL VALVE MV Area (PHT): 3.10 cm    SHUNTS MV Decel Time: 245 msec    Systemic VTI:  0.21 m MV E velocity: 86.00 cm/s  Systemic Diam: 2.20 cm MV A velocity: 71.10 cm/s MV E/A ratio:  1.21 Laurance Flatten MD Electronically signed by Laurance Flatten MD Signature Date/Time: 11/04/2021/2:10:38 PM    Final    MR Brain Wo Contrast (neuro protocol)  Result Date: 11/03/2021 CLINICAL DATA:  Follow-up examination for stroke. EXAM: MRI HEAD WITHOUT CONTRAST TECHNIQUE: Multiplanar, multiecho pulse sequences of the brain and surrounding structures were obtained without intravenous contrast. COMPARISON:  Prior CTA from 11/02/2021. FINDINGS: Brain: Cerebral volume within normal limits. Few scattered subcentimeter foci of T2/FLAIR hyperintensity noted involving the supratentorial cerebral white matter, nonspecific, but most commonly related to chronic microvascular ischemic disease. Overall, appearance is extremely mild for patient age. No evidence for acute or subacute ischemia. Gray-white matter differentiation maintained. No areas of chronic cortical infarction. No acute or chronic intracranial blood products. No mass lesion, midline shift or mass effect. No hydrocephalus or extra-axial fluid collection. Pituitary gland and suprasellar region within normal limits. Vascular:  Major intracranial vascular flow voids are maintained. Skull and upper cervical spine: Craniocervical junctional limits. Bone marrow signal intensity normal. No scalp soft tissue abnormality. Sinuses/Orbits: Globes and orbital soft tissues within normal limits. Few small retention cysts noted about the maxillary sinuses. Paranasal sinuses are otherwise clear. No mastoid effusion. Other: None. IMPRESSION: 1. No acute intracranial abnormality. 2. Mild cerebral white matter disease, nonspecific, but most commonly related to chronic microvascular ischemic disease. Electronically Signed   By: Rise Mu M.D.   On: 11/03/2021 19:40    PHYSICAL EXAM Pleasant obese middle-aged Caucasian lady not in distress. . Afebrile. Head is nontraumatic. Neck is supple without bruit.    Cardiac exam no murmur or gallop. Lungs are clear to auscultation. Distal pulses are well felt.  Mental Status: Alert, oriented x 4, speech is fluent, no aphasia or dysarthria  Cranial Nerves: II: Visual fields intact. PERRL  III,IV, VI: No ptosis. EOMI. Eye pain with leftward and upward gaze  V: Facial sensation to light touch is intact and symmetric to face VII: No facial asymmetry resting or with movement VIII: Hearing intact to voice IX,X: No hoarseness XI: Symmetric shoulder shrug XII: Midline tongue extension Motor: BUE 5/5  proximally and distally BLE 5/5 proximally and distally  No pronator drift.  Sensory: Temp and light touch intact throughout, bilaterally. No extinction to DSS.  Deep Tendon Reflexes: 2+ and symmetric throughout Cerebellar: Right lower extremity ataxia with HKS.  Gait: Deferred  ASSESSMENT/PLAN [name] is a 51 y.o. female with history of severe morbid obesity, essential hypertension, GERD, chronic anxiety/depression, psoriatic arthritis, cervical spondylosis status post arthropathy of cervical facet joint, presenting with headache. Received migraine cocktail in the ED  Having left  temporal and left eye pressure headache at a 2-3/10. No visual abnormality. Eye pain with left lateral and upward gaze. Denies diplopia.   Complicated migraine in the setting of recent medication changes with gabapentin  Code Stroke CT head No acute abnormality. Small vessel disease. Atrophy. ASPECTS 10.    CTA head & neck No significant stenosis MRI  pending 2D Echo - pending LDL 78 HgbA1c 6.0 VTE prophylaxis - lovenox    Diet   Diet regular Room service appropriate? Yes; Fluid consistency: Thin   No antithrombotic prior to admission, now on aspirin 81 mg daily.  Therapy recommendations: No recommendations per PT Disposition:  To be discharged home  Hypertension Home meds:  Olmesartan Stable Long-term BP goal normotensive  Hyperlipidemia Home meds:  None LDL 78, goal < 70 Add Atorvastatin 40mg   Continue statin at discharge  Other Stroke Risk Factors Obesity, Body mass index is 47.42 kg/m., BMI >/= 30 associated with increased stroke risk, recommend weight loss, diet and exercise as appropriate  Obstructive sleep apnea, not on CPAP at home due to mask anxiety  Other Active Problems Anxiety, depression- effexor, wellbutrin OSA- not on CPAP due to mask intolerance Prediabetes A1c 6%  Dietitian consult placed per patient request Educated on lifestyle modifications, follow up with PCP  Hospital day # 0  --  I have personally obtained history,examined this patient, reviewed notes, independently viewed imaging studies, participated in medical decision making and plan of care.ROS completed by me personally and pertinent positives fully documented  I have made any additions or clarifications directly to the above note. Agree with note above.  She presented with episode of headache with photophobia and speech difficulty and left-sided ataxia possibly complicated migraine episode since MRI scan and CT angiograms are unremarkable.  Recommend aspirin 81 mg daily for stroke prevention.   Episodes are not frequent enough to justify prophylactic medication at the present time but she can follow-up elective in the office as needed.  Greater than 50% time during this 25-minute visit was spent on counseling and coordination of care discussion with patient about TIA versus complicated migraine and answering questions.  , MD Medical Director [facility] Pager: [phone] 11/04/2021 2:48 PM   To contact Stroke Continuity provider, please refer to 11/06/2021. After hours, contact General Neurology

## 2021-11-04 NOTE — Progress Notes (Signed)
Echo with bubble study attempted. Patient currently has no IV. Nurse will start one and will attempt again then.

## 2021-11-04 NOTE — Care Management Obs Status (Signed)
MEDICARE OBSERVATION STATUS NOTIFICATION   Patient Details  Name: [name] MRN: [id number] Date of Birth: 1970-08-27   Medicare Observation Status Notification Given:  Yes    Kermit Balo, RN 11/04/2021, 9:33 AM

## 2021-11-04 NOTE — Progress Notes (Signed)
Nutrition Education Note  Consulted at pt's request to discuss diet as A1c is in prediabetic range.  Met with pt at bedside, reports that she will be discharged today. Pt has had bariatric procedure (band) in the past and states she knows changes she needs to make. Discussed typical eating patterns. Pt reports that she typically only has 1 meal a day (dinner) but will snack before bed. Drinks diet soda, water, or crystal light during the day. Discussed adding in more daytime calories and trying to eat at least 3 meals a day to provide body with consistent nutrition and to help with over-eating at night. Encouraged protein with all meals and adding fruits and vegetables. Discussed daytime calories would give more energy to be able to do more physical activity as well  All questions answered, no further questions at this time. Please consult if new nutrition needs arise.  Ranell Patrick, RD, LDN Clinical Dietitian RD pager # available in [place]  After hours/weekend pager # available in [facility]

## 2021-11-04 NOTE — Discharge Summary (Signed)
Physician Discharge Summary  [name] WJX:[id number] DOB: 10/14/1970 DOA: 11/02/2021  PCP: Chesley Noon, MD  Admit date: 11/02/2021 Discharge date: 11/04/2021  Admitted From: Home Disposition:  Home  Recommendations for Outpatient Follow-up:  Follow up with PCP in 1-2 weeks, reevaluated to see if gabapentin could be returned to her regular dose. Please obtain BMP/CBC in one week   Home Health:No Equipment/Devices:None  Discharge Condition:Stable CODE STATUS:Full Diet recommendation: Heart Healthy  Brief/Interim Summary: 51 y.o. 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  Mild cerebral white matter atrophy.  Related to chronic microvascular ischemic changes.  Discharge Diagnoses:  Principal Problem:   Acute CVA (cerebrovascular accident) ([place]) Active Problems:   Depression with anxiety   Morbid (severe) obesity due to excess calories (HCC)   Insomnia   Polyneuropathy  Complicated migraine headaches in the setting of recent medication change: A1c of 6.0 fasting lipid panel was 44 LDL of 78. Physical therapy evaluated the patient recommended home health PT. 2D echo follow-up by PCP. He was started on high-dose statins. No events on telemetry. She was started on Reglan and Benadryl and her headaches resolved. She will go home on sumatriptan.  Chronic depression and anxiety: Continue current regimen no changes made.  Polyneuropathy: Continue Neurontin.  Situational insomnia: Continue trazodone.  Leukocytosis: Going back through her chart her white blood cell count has been elevated chronically. She will follow-up with  hematology as an outpatient might need a bone marrow biopsy. Anemia panel B12 577 folate of 12.   Discharge Instructions  Discharge Instructions     Diet - low sodium heart healthy   Complete by: As directed    Increase activity slowly   Complete by: As directed       Allergies as of 11/04/2021       Reactions   Adhesive [tape]    Rash, blisters   Influenza Vaccines    States her "arm had a knot and turned red"   Lisinopril-hydrochlorothiazide    welts        Medication List     STOP taking these medications    acetaminophen 500 MG tablet Commonly known as: TYLENOL   benzonatate 100 MG capsule Commonly known as: TESSALON   cephALEXin 500 MG capsule Commonly known as: KEFLEX   meloxicam 15 MG tablet Commonly known as: MOBIC   methocarbamol 500 MG tablet Commonly known as: ROBAXIN   mirtazapine 15 MG tablet Commonly known as: REMERON   promethazine 25 MG tablet Commonly known as: PHENERGAN       TAKE these medications    albuterol 108 (90 Base) MCG/ACT inhaler Commonly known as: VENTOLIN HFA Inhale 1-2 puffs into the lungs every 6 (six) hours as needed for wheezing or shortness of breath.   ALPRAZolam 0.5 MG tablet Commonly known as: XANAX Take 0.5 mg by mouth 3 (three) times daily as needed for anxiety.   aspirin EC 81 MG tablet Take 81 mg by mouth daily. Swallow whole.   atorvastatin 40 MG tablet Commonly known as: LIPITOR Take 1 tablet (40 mg total) by mouth daily.   azelastine 0.1 % nasal spray Commonly known as: ASTELIN Place 2 sprays into both nostrils 2 (two) times  daily as needed for rhinitis. Use in each nostril as directed   baclofen 10 MG tablet Commonly known as: LIORESAL Take 10 mg by mouth daily as needed for muscle spasms.   buPROPion 300 MG 24 hr tablet Commonly known as: WELLBUTRIN XL Take 300 mg by mouth daily.   cetirizine 10 MG tablet Commonly known as: ZYRTEC Take 10 mg by mouth daily as needed for allergies.    collagenase 250 UNIT/GM ointment Commonly known as: SANTYL Apply 1 application topically every other day. Apply to right and left abdominal wounds every other day.   diltiazem 120 MG 24 hr capsule Commonly known as: CARDIZEM CD Take 120 mg by mouth daily.   gabapentin 300 MG capsule Commonly known as: NEURONTIN Take 300 mg by mouth 3 (three) times daily.   HYDROcodone-acetaminophen 5-325 MG tablet Commonly known as: NORCO/VICODIN Take 1 tablet by mouth every 4 (four) hours as needed.   ibuprofen 800 MG tablet Commonly known as: ADVIL Take 800 mg by mouth every 8 (eight) hours as needed for mild pain.   magnesium oxide 400 (240 Mg) MG tablet Commonly known as: MAG-OX Take 400 mg by mouth daily.   montelukast 10 MG tablet Commonly known as: SINGULAIR Take 10 mg by mouth at bedtime.   nebivolol 5 MG tablet Commonly known as: BYSTOLIC Take 5 mg by mouth daily.   olmesartan 40 MG tablet Commonly known as: BENICAR Take 40 mg by mouth every morning.   pantoprazole 40 MG tablet Commonly known as: PROTONIX Take 40 mg by mouth daily as needed (acid reflux).   polyethylene glycol 17 g packet Commonly known as: MIRALAX / GLYCOLAX Take 17 g by mouth daily as needed for mild constipation.   Stelara 90 MG/ML Sosy injection Generic drug: ustekinumab Inject 90 mg into the skin every 3 (three) months.   SUMAtriptan 20 MG/ACT nasal spray Commonly known as: IMITREX Place 1 spray (20 mg total) into the nose as needed for migraine or headache. May repeat in 2 hours if headache persists or recurs.   traZODone 150 MG tablet Commonly known as: DESYREL Take 150 mg by mouth at bedtime. What changed: Another medication with the same name was removed. Continue taking this medication, and follow the directions you see here.   venlafaxine XR 150 MG 24 hr capsule Commonly known as: EFFEXOR-XR Take 150 mg by mouth daily with breakfast.        Allergies  Allergen Reactions    Adhesive [Tape]     Rash, blisters   Influenza Vaccines     States her "arm had a knot and turned red"   Lisinopril-Hydrochlorothiazide     welts    Consultations: Neurology   Procedures/Studies: MR Brain Wo Contrast (neuro protocol)  Result Date: 11/03/2021 CLINICAL DATA:  Follow-up examination for stroke. EXAM: MRI HEAD WITHOUT CONTRAST TECHNIQUE: Multiplanar, multiecho pulse sequences of the brain and surrounding structures were obtained without intravenous contrast. COMPARISON:  Prior CTA from 11/02/2021. FINDINGS: Brain: Cerebral volume within normal limits. Few scattered subcentimeter foci of T2/FLAIR hyperintensity noted involving the supratentorial cerebral white matter, nonspecific, but most commonly related to chronic microvascular ischemic disease. Overall, appearance is extremely mild for patient age. No evidence for acute or subacute ischemia. Gray-white matter differentiation maintained. No areas of chronic cortical infarction. No acute or chronic intracranial blood products. No mass lesion, midline shift or mass effect. No hydrocephalus or extra-axial fluid collection. Pituitary gland and suprasellar region within normal limits. Vascular: Major intracranial vascular flow voids  are maintained. Skull and upper cervical spine: Craniocervical junctional limits. Bone marrow signal intensity normal. No scalp soft tissue abnormality. Sinuses/Orbits: Globes and orbital soft tissues within normal limits. Few small retention cysts noted about the maxillary sinuses. Paranasal sinuses are otherwise clear. No mastoid effusion. Other: None. IMPRESSION: 1. No acute intracranial abnormality. 2. Mild cerebral white matter disease, nonspecific, but most commonly related to chronic microvascular ischemic disease. Electronically Signed   By: Jeannine Boga M.D.   On: 11/03/2021 19:40   CT ANGIO HEAD NECK W WO CM  Result Date: 11/02/2021 CLINICAL DATA:  Neuro deficit, acute, stroke suspected.  Dizziness. Loss of balance. EXAM: CT ANGIOGRAPHY HEAD AND NECK TECHNIQUE: Multidetector CT imaging of the head and neck was performed using the standard protocol during bolus administration of intravenous contrast. Multiplanar CT image reconstructions and MIPs were obtained to evaluate the vascular anatomy. Carotid stenosis measurements (when applicable) are obtained utilizing NASCET criteria, using the distal internal carotid diameter as the denominator. RADIATION DOSE REDUCTION: This exam was performed according to the departmental dose-optimization program which includes automated exposure control, adjustment of the mA and/or kV according to patient size and/or use of iterative reconstruction technique. CONTRAST:  40m OMNIPAQUE IOHEXOL 350 MG/ML SOLN COMPARISON:  CT head without contrast 08/18/2020. MR head without contrast 03/23/2017 FINDINGS: CT HEAD FINDINGS Brain: No acute infarct, hemorrhage, or mass lesion is present. No significant white matter lesions are present. The ventricles are of normal size. No significant extraaxial fluid collection is present. The brainstem and cerebellum are within normal limits. Vascular: No hyperdense vessel or unexpected calcification. Skull: Calvarium is intact. No focal lytic or blastic lesions are present. No significant extracranial soft tissue lesion is present. Sinuses/Orbits: The paranasal sinuses and mastoid air cells are clear. The globes and orbits are within normal limits. Other: Review of the MIP images confirms the above findings CTA NECK FINDINGS Aortic arch: A 3 vessel arch configuration is present. No significant vascular disease is present at the arch. Right carotid system: The right common carotid artery is within normal limits. Bifurcation is unremarkable. Moderate tortuosity is present cervical right ICA without significant stenosis. Left carotid system: The left common carotid artery is within normal limits. Bifurcation is unremarkable. Mild tortuosity  is present cervical left ICA without significant stenosis. Vertebral arteries: The left vertebral artery is the dominant vessel. Both vertebral arteries originate from the subclavian arteries. No significant stenosis is present in either vertebral artery in the neck. Skeleton: Vertebral body heights and alignment are normal. No focal osseous lesions are present. Other neck: Soft tissues the neck are otherwise unremarkable. Salivary glands are within normal limits. Thyroid is normal. No significant adenopathy is present. No focal mucosal or submucosal lesions are present. Upper chest: The lung apices are clear. Thoracic inlet is within normal limits. Review of the MIP images confirms the above findings CTA HEAD FINDINGS Anterior circulation: Internal carotid arteries are within normal limits from the skull base through the ICA termini. The A1 and M1 segments are normal. No definite anterior communicating artery present. The MCA bifurcations are within normal limits. ACA and MCA branch vessels are normal. Posterior circulation: The left posterior cerebral artery is the dominant vessel. PICA origins are visualized and within normal limits. Vertebrobasilar junction and basilar artery are normal. Both posterior cerebral arteries originate from basilar tip. PCA branch vessels are within normal limits bilaterally. Venous sinuses: The dural sinuses are patent. The straight sinus deep cerebral veins are intact. Cortical veins are within normal limits. No  significant vascular malformation is evident. Anatomic variants: None Review of the MIP images confirms the above findings IMPRESSION: 1. Moderate tortuosity of the cervical internal carotid arteries bilaterally without significant stenosis. This is nonspecific, but can be seen in the setting of chronic hypertension. 2. Otherwise normal CTA circle-of-Willis without significant proximal stenosis, aneurysm, or branch vessel occlusion. Electronically Signed   By: San Morelle M.D.   On: 11/02/2021 15:24   (Echo, Carotid, EGD, Colonoscopy, ERCP)    Subjective: No complaints feels great  Discharge Exam: Vitals:   11/04/21 0042 11/04/21 0404  BP: (!) 101/59 (!) 101/52  Pulse: 94 76  Resp: 18   Temp: 98.3 F (36.8 C) 98.2 F (36.8 C)  SpO2: 90% 93%   Vitals:   11/03/21 1826 11/03/21 2000 11/04/21 0042 11/04/21 0404  BP: 110/80 102/78 (!) 101/59 (!) 101/52  Pulse: 75 94 94 76  Resp:   18   Temp: 98.2 F (36.8 C) 98.2 F (36.8 C) 98.3 F (36.8 C) 98.2 F (36.8 C)  TempSrc: Oral  Oral Oral  SpO2: 96% 96% 90% 93%  Weight:      Height:        General: Pt is alert, awake, not in acute distress Cardiovascular: RRR, S1/S2 +, no rubs, no gallops Respiratory: CTA bilaterally, no wheezing, no rhonchi Abdominal: Soft, NT, ND, bowel sounds + Extremities: no edema, no cyanosis    The results of significant diagnostics from this hospitalization (including imaging, microbiology, ancillary and laboratory) are listed below for reference.     Microbiology: No results found for this or any previous visit (from the past 240 hour(s)).   Labs: BNP (last 3 results) Recent Labs    04/15/21 1242  BNP 82.7   Basic Metabolic Panel: Recent Labs  Lab 11/02/21 1309 11/03/21 1028  NA 136 137  K 4.4 3.7  CL 101 106  CO2 24 25  GLUCOSE 132* 165*  BUN 11 9  CREATININE 0.88 0.86  CALCIUM 9.3 8.8*  MG  --  1.9  PHOS  --  3.0   Liver Function Tests: Recent Labs  Lab 11/02/21 1309 11/03/21 1028  AST 19 83*  ALT 14 65*  ALKPHOS 68 98  BILITOT 0.6 0.8  PROT 7.9 7.0  ALBUMIN 4.4 3.3*   No results for input(s): "LIPASE", "AMYLASE" in the last 168 hours. No results for input(s): "AMMONIA" in the last 168 hours. CBC: Recent Labs  Lab 11/02/21 1309 11/03/21 0639  WBC 11.4* 10.2  NEUTROABS 8.3* 7.0  HGB 16.7* 14.5  HCT 50.0* 44.4  MCV 93.5 94.1  PLT 296 240   Cardiac Enzymes: No results for input(s): "CKTOTAL", "CKMB", "CKMBINDEX",  "TROPONINI" in the last 168 hours. BNP: Invalid input(s): "POCBNP" CBG: Recent Labs  Lab 11/02/21 1304 11/03/21 0614  GLUCAP 148* 125*   D-Dimer No results for input(s): "DDIMER" in the last 72 hours. Hgb A1c Recent Labs    11/03/21 0639  HGBA1C 6.0*   Lipid Profile Recent Labs    11/03/21 0639  CHOL 149  HDL 44  LDLCALC 78  TRIG 134  CHOLHDL 3.4   Thyroid function studies No results for input(s): "TSH", "T4TOTAL", "T3FREE", "THYROIDAB" in the last 72 hours.  Invalid input(s): "FREET3" Anemia work up Recent Labs    11/03/21 1028  VITAMINB12 577  FOLATE 12.5  FERRITIN 52  TIBC 329  IRON 149  RETICCTPCT 1.9   Urinalysis    Component Value Date/Time   COLORURINE YELLOW 11/02/2021 1309  APPEARANCEUR HAZY (A) 11/02/2021 1309   LABSPEC 1.032 (H) 11/02/2021 1309   PHURINE 6.0 11/02/2021 1309   GLUCOSEU NEGATIVE 11/02/2021 1309   HGBUR NEGATIVE 11/02/2021 1309   BILIRUBINUR NEGATIVE 11/02/2021 1309   KETONESUR NEGATIVE 11/02/2021 1309   PROTEINUR 30 (A) 11/02/2021 1309   UROBILINOGEN 0.2 02/01/2011 2209   NITRITE NEGATIVE 11/02/2021 1309   LEUKOCYTESUR NEGATIVE 11/02/2021 1309   Sepsis Labs Recent Labs  Lab 11/02/21 1309 11/03/21 0639  WBC 11.4* 10.2   Microbiology No results found for this or any previous visit (from the past 240 hour(s)).    SIGNED:   Charlynne Cousins, MD  [facility] 11/04/2021, 8:26 AM Pager   If 7PM-7AM, please contact night-coverage [email or web address] Password [email or web address]

## 2021-11-04 NOTE — Progress Notes (Signed)
  Echocardiogram 2D Echocardiogram has been performed.  Gerda Diss 11/04/2021, 10:38 AM

## 2021-11-04 NOTE — TOC Transition Note (Signed)
Transition of Care [facility]) - CM/SW Discharge Note   Patient Details  Name: [name] MRN: [id number] Date of Birth: [birth year]  [facility] CM/SW Contact:  Kermit Balo, RN Phone Number: 11/04/2021, 9:59 AM   Clinical Narrative:    Pt is from home with boyfriend. She drives self as needed and manages her own medications and denies any issues.  No f/u per PT/OT and no DME needs.  Pt has transportation home.    Final next level of care: Home/Self Care Barriers to Discharge: No Barriers Identified   Patient Goals and CMS Choice        Discharge Placement                       Discharge Plan and Services                                     Social Determinants of Health (SDOH) Interventions     Readmission Risk Interventions     No data to display

## 2021-12-16 ENCOUNTER — Other Ambulatory Visit: Payer: Self-pay | Admitting: General Surgery

## 2021-12-16 DIAGNOSIS — K219 Gastro-esophageal reflux disease without esophagitis: Secondary | ICD-10-CM

## 2021-12-18 ENCOUNTER — Ambulatory Visit
Admission: RE | Admit: 2021-12-18 | Discharge: 2021-12-18 | Disposition: A | Discharge status: Home / Self Care | Payer: Medicare HMO | Source: Ambulatory Visit | Attending: General Surgery | Admitting: General Surgery

## 2021-12-18 DIAGNOSIS — K219 Gastro-esophageal reflux disease without esophagitis: Secondary | ICD-10-CM

## 2022-01-08 ENCOUNTER — Ambulatory Visit (INDEPENDENT_AMBULATORY_CARE_PROVIDER_SITE_OTHER): Payer: Medicare HMO | Admitting: Neurology

## 2022-01-08 ENCOUNTER — Encounter: Payer: Self-pay | Admitting: Neurology

## 2022-01-08 VITALS — BP 107/75 | HR 91 | Ht 71.0 in | Wt 335.0 lb

## 2022-01-08 DIAGNOSIS — G43009 Migraine without aura, not intractable, without status migrainosus: Secondary | ICD-10-CM | POA: Diagnosis not present

## 2022-01-08 DIAGNOSIS — G43909 Migraine, unspecified, not intractable, without status migrainosus: Secondary | ICD-10-CM

## 2022-01-08 DIAGNOSIS — G44209 Tension-type headache, unspecified, not intractable: Secondary | ICD-10-CM

## 2022-01-08 MED ORDER — TOPIRAMATE 50 MG PO TABS: 50.0000 mg | ORAL_TABLET | Freq: Two times a day (BID) | ORAL | 3 refills | Status: DC

## 2022-01-08 MED ORDER — SUMATRIPTAN 20 MG/ACT NA SOLN
1.0000 | NASAL | 2 refills | Status: DC | PRN
Start: 2022-01-08 — End: 2023-06-03

## 2022-01-08 NOTE — Patient Instructions (Signed)
I had a long discussion with the patient regarding the recent episode of complicated migraine and her recent increased frequency of migraine headaches which also have a component of tension headache.  Recommend she continue to use Imitrex for symptomatic relief and Topamax 50 mg twice daily for headache prophylaxis.  I discussed possible side effects with her in light of the chronic also recommended she increase participation in regular activities for stress relaxation and also do stretching exercises.  He will stay on aspirin for stroke prevention and maintain aggressive risk factor modification strict control of hypertension blood pressure goal below 140/90 and lipids with LDL cholesterol goal below 70 mg percent.  She will return for follow-up in 3 months or call earlier if. necessary.  Tension Headache, Adult A tension headache is a feeling of pain, pressure, or aching over the front and sides of the head. The pain can be dull, or it can feel tight. There are two types of tension headache: Episodic tension headache. This is when the headaches happen fewer than 15 days a month. Chronic tension headache. This is when the headaches happen more than 15 days a month during a 68-month period. A tension headache can last from 30 minutes to several days. It is the most common kind of headache. Tension headaches are not normally associated with nausea or vomiting, and they do not get worse with physical activity. What are the causes? The exact cause of this condition is not known. Tension headaches are often triggered by stress, anxiety, or depression. Other triggers may include: Alcohol. Too much caffeine or caffeine withdrawal. Respiratory infections, such as colds, flu, or sinus infections. Dental problems or teeth clenching. Fatigue. Holding your head and neck in the same position for a long period of time, such as while using a computer. Smoking. Arthritis of the neck. What are the signs or  symptoms? Symptoms of this condition include: A feeling of pressure or tightness around the head. Dull, aching head pain. Pain over the front and sides of the head. Tenderness in the muscles of the head, neck, and shoulders. How is this diagnosed? This condition may be diagnosed based on your symptoms, your medical history, and a physical exam. If your symptoms are severe or unusual, you may have imaging tests, such as a CT scan or an MRI of your head. Your vision may also be checked. How is this treated? This condition may be treated with lifestyle changes and with medicines that help relieve symptoms. Follow these instructions at home: Managing pain Take over-the-counter and prescription medicines only as told by your health care provider. When you have a headache, lie down in a dark, quiet room. If directed, put ice on your head and neck. To do this: Put ice in a plastic bag. Place a towel between your skin and the bag. Leave the ice on for 20 minutes, 2-3 times a day. Remove the ice if your skin turns bright red. This is very important. If you cannot feel pain, heat, or cold, you have a greater risk of damage to the area. If directed, apply heat to the back of your neck as often as told by your health care provider. Use the heat source that your health care provider recommends, such as a moist heat pack or a heating pad. Place a towel between your skin and the heat source. Leave the heat on for 20-30 minutes. Remove the heat if your skin turns bright red. This is especially important if you are unable  to feel pain, heat, or cold. You have a greater risk of getting burned. Eating and drinking Eat meals on a regular schedule. If you drink alcohol: Limit how much you have to: 0-1 drink a day for women who are not pregnant. 0-2 drinks a day for men. Know how much alcohol is in your drink. In the [place] one drink equals one 12 oz bottle of beer (355 mL), one 5 oz glass of wine (148 mL),  or one 1 oz glass of hard liquor (44 mL). Drink enough fluid to keep your urine pale yellow. Decrease your caffeine intake, or stop using caffeine. Lifestyle Get 7-9 hours of sleep each night, or get the amount of sleep recommended by your health care provider. At bedtime, remove computers, phones, and tablets from your room. Find ways to manage your stress. This may include: Exercise. Deep breathing exercises. Yoga. Listening to music. Positive mental imagery. Try to sit up straight and avoid tensing your muscles. Do not use any products that contain nicotine or tobacco. These include cigarettes, chewing tobacco, and vaping devices, such as e-cigarettes. If you need help quitting, ask your health care provider. General instructions  Avoid any headache triggers. Keep a journal to help find out what may trigger your headaches. For example, write down: What you eat and drink. How much sleep you get. Any change to your diet or medicines. Keep all follow-up visits. This is important. Contact a health care provider if: Your headache does not get better. Your headache comes back. You are sensitive to sounds, light, or smells because of a headache. You have nausea or you vomit. Your stomach hurts. Get help right away if: You suddenly develop a severe headache, along with any of the following: A stiff neck. Nausea and vomiting. Confusion. Weakness in one part or one side of your body. Double vision or loss of vision. Shortness of breath. Rash. Unusual sleepiness. Fever or chills. Trouble speaking. Pain in your eye or ear. Trouble walking or balancing. Feeling faint or passing out. Summary A tension headache is a feeling of pain, pressure, or aching over the front and sides of the head. A tension headache can last from 30 minutes to several days. It is the most common kind of headache. This condition may be diagnosed based on your symptoms, your medical history, and a physical  exam. This condition may be treated with lifestyle changes and with medicines that help relieve symptoms. This information is not intended to replace advice given to you by your health care provider. Make sure you discuss any questions you have with your health care provider. Document Revised: 12/22/2019 Document Reviewed: 12/22/2019 Elsevier Patient Education  [employer].    Neck Exercises Ask your health care provider which exercises are safe for you. Do exercises exactly as told by your health care provider and adjust them as directed. It is normal to feel mild stretching, pulling, tightness, or discomfort as you do these exercises. Stop right away if you feel sudden pain or your pain gets worse. Do not begin these exercises until told by your health care provider. Neck exercises can be important for many reasons. They can improve strength and maintain flexibility in your neck, which will help your upper back and prevent neck pain. Stretching exercises Rotation neck stretching  Sit in a chair or stand up. Place your feet flat on the floor, shoulder-width apart. Slowly turn your head (rotate) to the right until a slight stretch is felt. Turn it all  the way to the right so you can look over your right shoulder. Do not tilt or tip your head. Hold this position for 10-30 seconds. Slowly turn your head (rotate) to the left until a slight stretch is felt. Turn it all the way to the left so you can look over your left shoulder. Do not tilt or tip your head. Hold this position for 10-30 seconds. Repeat __________ times. Complete this exercise __________ times a day. Neck retraction  Sit in a sturdy chair or stand up. Look straight ahead. Do not bend your neck. Use your fingers to push your chin backward (retraction). Do not bend your neck for this movement. Continue to face straight ahead. If you are doing the exercise properly, you will feel a slight sensation in your throat and a stretch  at the back of your neck. Hold the stretch for 1-2 seconds. Repeat __________ times. Complete this exercise __________ times a day. Strengthening exercises Neck press  Lie on your back on a firm bed or on the floor with a pillow under your head. Use your neck muscles to push your head down on the pillow and straighten your spine. Hold the position as well as you can. Keep your head facing up (in a neutral position) and your chin tucked. Slowly count to 5 while holding this position. Repeat __________ times. Complete this exercise __________ times a day. Isometrics These are exercises in which you strengthen the muscles in your neck while keeping your neck still (isometrics). Sit in a supportive chair and place your hand on your forehead. Keep your head and face facing straight ahead. Do not flex or extend your neck while doing isometrics. Push forward with your head and neck while pushing back with your hand. Hold for 10 seconds. Do the sequence again, this time putting your hand against the back of your head. Use your head and neck to push backward against the hand pressure. Finally, do the same exercise on either side of your head, pushing sideways against the pressure of your hand. Repeat __________ times. Complete this exercise __________ times a day. Prone head lifts  Lie face-down (prone position), resting on your elbows so that your chest and upper back are raised. Start with your head facing downward, near your chest. Position your chin either on or near your chest. Slowly lift your head upward. Lift until you are looking straight ahead. Then continue lifting your head as far back as you can comfortably stretch. Hold your head up for 5 seconds. Then slowly lower it to your starting position. Repeat __________ times. Complete this exercise __________ times a day. Supine head lifts  Lie on your back (supine position), bending your knees to point to the ceiling and keeping your feet  flat on the floor. Lift your head slowly off the floor, raising your chin toward your chest. Hold for 5 seconds. Repeat __________ times. Complete this exercise __________ times a day. Scapular retraction  Stand with your arms at your sides. Look straight ahead. Slowly pull both shoulders (scapulae) backward and downward (retraction) until you feel a stretch between your shoulder blades in your upper back. Hold for 10-30 seconds. Relax and repeat. Repeat __________ times. Complete this exercise __________ times a day. Contact a health care provider if: Your neck pain or discomfort gets worse when you do an exercise. Your neck pain or discomfort does not improve within 2 hours after you exercise. If you have any of these problems, stop exercising right away.  Do not do the exercises again unless your health care provider says that you can. Get help right away if: You develop sudden, severe neck pain. If this happens, stop exercising right away. Do not do the exercises again unless your health care provider says that you can. This information is not intended to replace advice given to you by your health care provider. Make sure you discuss any questions you have with your health care provider. Document Revised: 09/18/2020 Document Reviewed: 09/18/2020 Elsevier Patient Education  [employer].

## 2022-01-08 NOTE — Progress Notes (Signed)
[facility] [address] [place]. [place] [address] [phone]       OFFICE FOLLOW-UP NOTE  [name] Date of Birth:  09/10/1970 Medical Record Number:  [id number]   HPI: [name] is a 51 year old Caucasian lady seen today for initial office follow-up visit following hospital consultation in July 2023.[name] is an 51 y.o. female with a PMHx of anxiety, arthritis, asthma, major depressive disorder, history of suicide attempt in 2018, HTN and psoriasis who presented to the [facility] she is tired.  On 11/03/21 afternoon with a c/c of headache that had started the day prior at about noon. The headache was accompanied by balance issues and dizziness. She also had felt at that time that her left eye felt droopy. She had some nausea but no emesis. Headache was holocephalic but worse on the left side in the parietal occipital region, throbbing "like a jackhammer" and rated as 8/10 at its worst, with associated photophobia but without osmophobia or sonophobia. She was afebrile with BP of 125/85 and normal O2 sats. Exam by EDP revealed left upper extremity dysmetria, raising concern for CVA. CTA of head and neck revealed moderate tortuosity of the cervical internal carotid arteries bilaterally without significant stenosis and was otherwise unremarkable.  MRI scan of the brain showed no acute abnormality.  LDL cholesterol was 78 mg percent.  Hemoglobin A1c of 6.0.  Echocardiogram showed ejection fraction of 60 to 55% without cardiac source embolism.  He was treated with headache cocktail which resolved the headache.  He was diagnosed with complicated migraine episode.  She was prescribed Imitrex nasal spray.  She states she is done well since discharge.  Continues to have headaches with the Norvasc.  At the previous but the Imitrex nasal.  Works pretty well and release the headache.  She describes headache as occurring at  frequency of once a week.  Headaches are moderate 8/10 in  severity beginning in the back of her shoulders and back of the head and is throbbing in nature and  last 4 hours .  There is light and sound sensitivity.  She also describes tightness in the muscles of the neck.  Headaches are triggered by stress, increase physical activity t and tiredness.  She has never been on headache prophylactic medications.  She denies any recurrent stroke or TIA symptoms.  She is tolerating treatment well without bleeding or bruising.  She is also tolerating Lipitor well without muscle aches and pains..  ROS:   14 system review of systems is positive for headache, neck pain, light and sound sensitivity decreased hearing, tinnitus, slurred speech, left-sided ataxia and all other systems negative PMH:  Past Medical History:  Diagnosis Date   Anxiety    Arthritis    joints   Asthma    Endometriosis    Fat necrosis of breast    right   GERD (gastroesophageal reflux disease)    History of hiatal hernia    s/p repair few times   History of suicide attempt 03/2017   Hypertension    MDD (major depressive disorder)    Panniculitis    Pneumonia 10/2018   in hospital with pneumonia   PONV (postoperative nausea and vomiting)    Psoriasis    treated with Humeria    Social History:  Social History   Socioeconomic History   Marital status: Widowed    Spouse name: Not on file   Number of children: Not on file   Years of education:  Not on file   Highest education level: Not on file  Occupational History   Not on file  Tobacco Use   Smoking status: Every Day    Packs/day: 1.00    Years: 20.00    Total pack years: 20.00    Types: Cigarettes    Last attempt to quit: 05/20/2015    Years since quitting: 6.6   Smokeless tobacco: Never   Tobacco comments:    < 1 pack  Vaping Use   Vaping Use: Never used  Substance and Sexual Activity   Alcohol use: Yes    Comment: socially   Drug use: Never   Sexual activity: Yes    Birth control/protection: Surgical  Other  Topics Concern   Not on file  Social History Narrative   Mother lives with her.       Social Determinants of Health   Financial Resource Strain: Not on file  Food Insecurity: Not on file  Transportation Needs: Not on file  Physical Activity: Not on file  Stress: Not on file  Social Connections: Not on file  Intimate Partner Violence: Not on file    Medications:   Current Outpatient Medications on File Prior to Visit  Medication Sig Dispense Refill   albuterol (VENTOLIN HFA) 108 (90 Base) MCG/ACT inhaler Inhale 1-2 puffs into the lungs every 6 (six) hours as needed for wheezing or shortness of breath. 6.7 g 0   ALPRAZolam (XANAX) 0.5 MG tablet Take 0.5 mg by mouth 3 (three) times daily as needed for anxiety.      aspirin EC 81 MG tablet Take 81 mg by mouth daily. Swallow whole.     atorvastatin (LIPITOR) 40 MG tablet Take 1 tablet (40 mg total) by mouth daily. 30 tablet 3   azelastine (ASTELIN) 137 MCG/SPRAY nasal spray Place 2 sprays into both nostrils 2 (two) times daily as needed for rhinitis. Use in each nostril as directed     baclofen (LIORESAL) 10 MG tablet Take 10 mg by mouth daily as needed for muscle spasms.     buPROPion (WELLBUTRIN XL) 300 MG 24 hr tablet Take 300 mg by mouth daily.     cetirizine (ZYRTEC) 10 MG tablet Take 10 mg by mouth daily as needed for allergies.      diltiazem (CARDIZEM CD) 120 MG 24 hr capsule Take 120 mg by mouth daily.     gabapentin (NEURONTIN) 300 MG capsule Take 300 mg by mouth 3 (three) times daily.     magnesium oxide (MAG-OX) 400 (240 Mg) MG tablet Take 400 mg by mouth daily.     montelukast (SINGULAIR) 10 MG tablet Take 10 mg by mouth at bedtime.      nebivolol (BYSTOLIC) 5 MG tablet Take 5 mg by mouth daily.     pantoprazole (PROTONIX) 40 MG tablet Take 40 mg by mouth daily as needed (acid reflux).     traZODone (DESYREL) 150 MG tablet Take 150 mg by mouth at bedtime.     ustekinumab (STELARA) 90 MG/ML SOSY injection Inject 90 mg into  the skin every 3 (three) months.     venlafaxine XR (EFFEXOR-XR) 150 MG 24 hr capsule Take 150 mg by mouth daily with breakfast.      No current facility-administered medications on file prior to visit.    Allergies:   Allergies  Allergen Reactions   Adhesive [Tape]     Rash, blisters   Influenza Vaccines     States her "arm had a knot and  turned red"   Lisinopril-Hydrochlorothiazide     welts    Physical Exam General: Late January, seated, in no evident distress Head: head normocephalic and atraumatic.  Neck: supple with no carotid or supraclavicular bruits Cardiovascular: regular rate and rhythm, no murmurs Musculoskeletal: no deformity Skin:  no rash/petichiae Vascular:  Normal pulses all extremities Vitals:   01/08/22 0848  BP: 107/75  Pulse: 91   Neurologic Exam Mental Status: Awake and fully alert. Oriented to place and time. Recent and remote memory intact. Attention span, concentration and fund of knowledge appropriate. Mood and affect appropriate.  Cranial Nerves: Fundoscopic exam reveals sharp disc margins. Pupils equal, briskly reactive to light. Extraocular movements full without nystagmus. Visual fields full to confrontation. Hearing intact. Facial sensation intact. Face, tongue, palate moves normally and symmetrically.  Motor: Normal bulk and tone. Normal strength in all tested extremity muscles. Sensory.: intact to touch ,pinprick .position and vibratory sensation.  Coordination: Rapid alternating movements normal in all extremities. Finger-to-nose and heel-to-shin performed accurately bilaterally. Gait and Station: Arises from chair without difficulty. Stance is normal. Gait demonstrates normal stride length and balance . Able to heel, toe and tandem walk with slight difficulty.  Reflexes: 1+ and symmetric. Toes downgoing.   NIHSS  0 Modified Rankin  1   ASSESSMENT: 51 year old Caucasian lady with mixed migraine and tension headache with recent episode of  complicated migraine in July 2023.  Vascular risk factors of obesity and mild hyperlipidemia     PLAN: I had a long discussion with the patient regarding the recent episode of complicated migraine and her recent increased frequency of migraine headaches which also have a component of tension headache.  Recommend she continue to use Imitrex for symptomatic relief and Topamax 50 mg twice daily for headache prophylaxis.  I discussed possible side effects with her in light of the chronic also recommended she increase participation in regular activities for stress relaxation and also do stretching exercises.  He will stay on aspirin for stroke prevention and maintain aggressive risk factor modification strict control of hypertension blood pressure goal below 140/90 and lipids with LDL cholesterol goal below 70 mg percent.  She will return for follow-up in 3 months or call earlier if. necessary. Greater than 50% of time during this 35 minute visit was spent on counseling,explanation of diagnosis of migraine and complicated migraine planning of further management, discussion with patient and family and coordination of care Antony Contras, MD Note: This document was prepared with digital dictation and possible smart phrase technology. Any transcriptional errors that result from this process are unintentional

## 2022-04-05 ENCOUNTER — Other Ambulatory Visit: Payer: Self-pay | Admitting: Neurology

## 2022-05-13 ENCOUNTER — Other Ambulatory Visit: Payer: Self-pay | Admitting: Neurology

## 2022-05-27 ENCOUNTER — Ambulatory Visit: Payer: Medicare HMO | Admitting: Neurology

## 2022-06-26 ENCOUNTER — Ambulatory Visit (INDEPENDENT_AMBULATORY_CARE_PROVIDER_SITE_OTHER): Payer: Medicare HMO | Admitting: Neurology

## 2022-06-26 ENCOUNTER — Encounter: Payer: Self-pay | Admitting: Neurology

## 2022-06-26 VITALS — BP 118/77 | HR 87 | Ht 71.0 in | Wt 335.0 lb

## 2022-06-26 DIAGNOSIS — G43009 Migraine without aura, not intractable, without status migrainosus: Secondary | ICD-10-CM

## 2022-06-26 DIAGNOSIS — G44209 Tension-type headache, unspecified, not intractable: Secondary | ICD-10-CM

## 2022-06-26 NOTE — Patient Instructions (Signed)
I had a long discussion with the patient regarding her episode of complicated migraine and her chronic migraine headaches which also have a component of tension headache.  Recommend she continue to use Imitrex for symptomatic relief and Topamax 50 mg twice daily for headache prophylaxis.  I  also recommended she increase participation in regular activities for stress relaxation and also do stretching exercises.  He will stay on Plavix for stroke prevention and maintain aggressive risk factor modification strict control of hypertension blood pressure goal below 140/90 and lipids with LDL cholesterol goal below 70 mg percent.  She will return for follow-up in 6 months or call earlier if. necessary.

## 2022-06-26 NOTE — Progress Notes (Signed)
[facility] [address] [place]. [place] [address] [phone]       OFFICE FOLLOW-UP NOTE  [name] Date of Birth:  1970-10-12 Medical Record Number:  [id number]   HPI: Initial visit 01/08/2022 [name] is a 52 year old Caucasian lady seen today for initial office follow-up visit following hospital consultation in July 2023.[name] is an 52 y.o. female with a PMHx of anxiety, arthritis, asthma, major depressive disorder, history of suicide attempt in 2018, HTN and psoriasis who presented to the [facility] she is tired.  On 11/03/21 afternoon with a c/c of headache that had started the day prior at about noon. The headache was accompanied by balance issues and dizziness. She also had felt at that time that her left eye felt droopy. She had some nausea but no emesis. Headache was holocephalic but worse on the left side in the parietal occipital region, throbbing "like a jackhammer" and rated as 8/10 at its worst, with associated photophobia but without osmophobia or sonophobia. She was afebrile with BP of 125/85 and normal O2 sats. Exam by EDP revealed left upper extremity dysmetria, raising concern for CVA. CTA of head and neck revealed moderate tortuosity of the cervical internal carotid arteries bilaterally without significant stenosis and was otherwise unremarkable.  MRI scan of the brain showed no acute abnormality.  LDL cholesterol was 78 mg percent.  Hemoglobin A1c of 6.0.  Echocardiogram showed ejection fraction of 60 to 55% without cardiac source embolism.  He was treated with headache cocktail which resolved the headache.  He was diagnosed with complicated migraine episode.  She was prescribed Imitrex nasal spray.  She states she is done well since discharge.  Continues to have headaches with the Norvasc.  At the previous but the Imitrex nasal.  Works pretty well and release the headache.  She describes headache as occurring at  frequency of once a week.   Headaches are moderate 8/10 in severity beginning in the back of her shoulders and back of the head and is throbbing in nature and  last 4 hours .  There is light and sound sensitivity.  She also describes tightness in the muscles of the neck.  Headaches are triggered by stress, increase physical activity t and tiredness.  She has never been on headache prophylactic medications.  She denies any recurrent stroke or TIA symptoms.  She is tolerating treatment well without bleeding or bruising.  She is also tolerating Lipitor well without muscle aches and pains.Marland Kitchen Update 06/26/2022 : She returns for follow-up after last visit with me 5 months ago.  She states she is doing quite well.  She has had no further episodes of TIA, stroke or complicated migraine episodes.  Continues to have a few severe headaches once a month or so which respond to Imitrex.  He has a couple of minor headaches a month as well.  She remains on Topamax 50 mg twice daily which she is tolerating well without significant side effects.  She does participate in activities like meditation and goes to church for relaxation.  Her cardiologist changed her from aspirin to Plavix for cardiac and stroke prevention recently.  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  She has no new complaints.     ROS:   14 system review of systems is positive for headache, neck pain, light and sound sensitivity decreased hearing, tinnitus, slurred  speech, left-sided ataxia and all other systems negative PMH:  Past Medical History:  Diagnosis Date   Anxiety    Arthritis    joints   Asthma    Endometriosis    Fat necrosis of breast    right   GERD (gastroesophageal reflux disease)    History of hiatal hernia    s/p repair few times   History of suicide attempt 03/2017   Hypertension    MDD (major depressive disorder)    Panniculitis    Pneumonia 10/2018    in hospital with pneumonia   PONV (postoperative nausea and vomiting)    Psoriasis    treated with Humeria    Social History:  Social History   Socioeconomic History   Marital status: Widowed    Spouse name: Not on file   Number of children: Not on file   Years of education: Not on file   Highest education level: Not on file  Occupational History   Not on file  Tobacco Use   Smoking status: Every Day    Packs/day: 1.00    Years: 20.00    Additional pack years: 0.00    Total pack years: 20.00    Types: Cigarettes    Last attempt to quit: 05/20/2015    Years since quitting: 7.1   Smokeless tobacco: Never   Tobacco comments:    < 1 pack  Vaping Use   Vaping Use: Never used  Substance and Sexual Activity   Alcohol use: Yes    Comment: socially   Drug use: Never   Sexual activity: Yes    Birth control/protection: Surgical  Other Topics Concern   Not on file  Social History Narrative   Mother lives with her.       Social Determinants of Health   Financial Resource Strain: Not on file  Food Insecurity: Not on file  Transportation Needs: Not on file  Physical Activity: Not on file  Stress: Not on file  Social Connections: Not on file  Intimate Partner Violence: Not on file    Medications:   Current Outpatient Medications on File Prior to Visit  Medication Sig Dispense Refill   albuterol (VENTOLIN HFA) 108 (90 Base) MCG/ACT inhaler Inhale 1-2 puffs into the lungs every 6 (six) hours as needed for wheezing or shortness of breath. 6.7 g 0   ALPRAZolam (XANAX) 0.5 MG tablet Take 0.5 mg by mouth 3 (three) times daily as needed for anxiety.      atorvastatin (LIPITOR) 40 MG tablet Take 1 tablet (40 mg total) by mouth daily. 30 tablet 3   azelastine (ASTELIN) 0.1 % nasal spray Place into the nose.     azelastine (ASTELIN) 137 MCG/SPRAY nasal spray Place 2 sprays into both nostrils 2 (two) times daily as needed for rhinitis. Use in each nostril as directed     baclofen  (LIORESAL) 10 MG tablet Take 10 mg by mouth daily as needed for muscle spasms.     Black Cohosh 540 MG CAPS Take by mouth.     buPROPion (WELLBUTRIN XL) 300 MG 24 hr tablet Take 300 mg by mouth daily.     celecoxib (CELEBREX) 200 MG capsule Take by mouth.     cetirizine (ZYRTEC) 10 MG tablet Take 10 mg by mouth daily as needed for allergies.      clopidogrel (PLAVIX) 75 MG tablet Take by mouth.     diltiazem (CARDIZEM CD) 120 MG 24 hr capsule Take 120 mg by mouth daily.  gabapentin (NEURONTIN) 300 MG capsule Take 300 mg by mouth 3 (three) times daily.     magnesium oxide (MAG-OX) 400 (240 Mg) MG tablet Take 400 mg by mouth daily.     mirtazapine (REMERON) 15 MG tablet Take by mouth.     montelukast (SINGULAIR) 10 MG tablet Take 10 mg by mouth at bedtime.      nebivolol (BYSTOLIC) 5 MG tablet Take 5 mg by mouth daily.     pantoprazole (PROTONIX) 40 MG tablet Take 40 mg by mouth daily as needed (acid reflux).     promethazine-dextromethorphan (PROMETHAZINE-DM) 6.25-15 MG/5ML syrup Take by mouth.     SUMAtriptan (IMITREX) 20 MG/ACT nasal spray Place 1 spray (20 mg total) into the nose as needed for migraine or headache. May repeat in 2 hours if headache persists or recurs. 1 each 2   topiramate (TOPAMAX) 50 MG tablet TAKE 1 TABLET BY MOUTH TWICE A DAY 180 tablet 1   traZODone (DESYREL) 150 MG tablet Take 150 mg by mouth at bedtime.     traZODone (DESYREL) 150 MG tablet Take by mouth.     ustekinumab (STELARA) 90 MG/ML SOSY injection Inject 90 mg into the skin every 3 (three) months.     venlafaxine XR (EFFEXOR-XR) 150 MG 24 hr capsule Take 150 mg by mouth daily with breakfast.      No current facility-administered medications on file prior to visit.    Allergies:   Allergies  Allergen Reactions   Adhesive [Tape]     Rash, blisters   Influenza Vaccines     States her "arm had a knot and turned red"   Lisinopril-Hydrochlorothiazide     welts    Physical Exam General: Late January,  seated, in no evident distress Head: head normocephalic and atraumatic.  Neck: supple with no carotid or supraclavicular bruits Cardiovascular: regular rate and rhythm, no murmurs Musculoskeletal: no deformity.  Mild tightness of posterior neck muscles but no restriction of movement. Skin:  no rash/petichiae Vascular:  Normal pulses all extremities Vitals:   06/26/22 1129  BP: 118/77  Pulse: 87   Neurologic Exam Mental Status: Awake and fully alert. Oriented to place and time. Recent and remote memory intact. Attention span, concentration and fund of knowledge appropriate. Mood and affect appropriate.  Cranial Nerves: Fundoscopic exam not done Pupils equal, briskly reactive to light. Extraocular movements full without nystagmus. Visual fields full to confrontation. Hearing intact. Facial sensation intact. Face, tongue, palate moves normally and symmetrically.  Motor: Normal bulk and tone. Normal strength in all tested extremity muscles. Sensory.: intact to touch ,pinprick .position and vibratory sensation.  Coordination: Rapid alternating movements normal in all extremities. Finger-to-nose and heel-to-shin performed accurately bilaterally. Gait and Station: Arises from chair without difficulty. Stance is normal. Gait demonstrates normal stride length and balance .  Not able to heel, toe and tandem walk with slight difficulty.  Reflexes: 1+ and symmetric. Toes downgoing.      ASSESSMENT: 52 year old Caucasian lady with mixed migraine and tension headache with recent episode of complicated migraine in July 2023.  Vascular risk factors of obesity and mild hyperlipidemia     PLAN: I had a long discussion with the patient regarding her episode of complicated migraine and her chronic migraine headaches which also have a component of tension headache.  Recommend she continue to use Imitrex for symptomatic relief and Topamax 50 mg twice daily for headache prophylaxis.  I  also recommended she  increase participation in regular activities for stress relaxation  and also do stretching exercises.  She will stay on Plavix for stroke prevention and maintain aggressive risk factor modification strict control of hypertension blood pressure goal below 140/90 and lipids with LDL cholesterol goal below 70 mg percent.  She will return for follow-up in 6 months or call earlier if. necessary. Greater than 50% of time during this 35 minute visit was spent on counseling,explanation of diagnosis of migraine and complicated migraine planning of further management, discussion with patient and family and coordination of care Antony Contras, MD Note: This document was prepared with digital dictation and possible smart phrase technology. Any transcriptional errors that result from this process are unintentional

## 2022-10-03 ENCOUNTER — Encounter (HOSPITAL_COMMUNITY): Payer: Self-pay | Admitting: Emergency Medicine

## 2022-10-03 ENCOUNTER — Other Ambulatory Visit: Payer: Self-pay

## 2022-10-03 ENCOUNTER — Inpatient Hospital Stay (HOSPITAL_COMMUNITY)
Admission: EM | Admit: 2022-10-03 | Discharge: 2022-10-05 | DRG: 177 | Disposition: A | Discharge status: Home / Self Care | Payer: Medicare HMO | Attending: Internal Medicine | Admitting: Internal Medicine

## 2022-10-03 ENCOUNTER — Emergency Department (HOSPITAL_COMMUNITY): Payer: Medicare HMO

## 2022-10-03 DIAGNOSIS — Z79899 Other long term (current) drug therapy: Secondary | ICD-10-CM

## 2022-10-03 DIAGNOSIS — J9601 Acute respiratory failure with hypoxia: Secondary | ICD-10-CM | POA: Diagnosis present

## 2022-10-03 DIAGNOSIS — Z888 Allergy status to other drugs, medicaments and biological substances status: Secondary | ICD-10-CM | POA: Diagnosis not present

## 2022-10-03 DIAGNOSIS — G4733 Obstructive sleep apnea (adult) (pediatric): Secondary | ICD-10-CM | POA: Diagnosis present

## 2022-10-03 DIAGNOSIS — F329 Major depressive disorder, single episode, unspecified: Secondary | ICD-10-CM | POA: Diagnosis present

## 2022-10-03 DIAGNOSIS — G43909 Migraine, unspecified, not intractable, without status migrainosus: Secondary | ICD-10-CM | POA: Diagnosis present

## 2022-10-03 DIAGNOSIS — Z9151 Personal history of suicidal behavior: Secondary | ICD-10-CM | POA: Diagnosis not present

## 2022-10-03 DIAGNOSIS — F17210 Nicotine dependence, cigarettes, uncomplicated: Secondary | ICD-10-CM | POA: Diagnosis present

## 2022-10-03 DIAGNOSIS — Z79620 Long term (current) use of immunosuppressive biologic: Secondary | ICD-10-CM

## 2022-10-03 DIAGNOSIS — Z9884 Bariatric surgery status: Secondary | ICD-10-CM | POA: Diagnosis not present

## 2022-10-03 DIAGNOSIS — Z7902 Long term (current) use of antithrombotics/antiplatelets: Secondary | ICD-10-CM

## 2022-10-03 DIAGNOSIS — Z8673 Personal history of transient ischemic attack (TIA), and cerebral infarction without residual deficits: Secondary | ICD-10-CM | POA: Diagnosis not present

## 2022-10-03 DIAGNOSIS — Z8701 Personal history of pneumonia (recurrent): Secondary | ICD-10-CM

## 2022-10-03 DIAGNOSIS — Z8249 Family history of ischemic heart disease and other diseases of the circulatory system: Secondary | ICD-10-CM | POA: Diagnosis not present

## 2022-10-03 DIAGNOSIS — L4050 Arthropathic psoriasis, unspecified: Secondary | ICD-10-CM | POA: Diagnosis present

## 2022-10-03 DIAGNOSIS — Z91048 Other nonmedicinal substance allergy status: Secondary | ICD-10-CM

## 2022-10-03 DIAGNOSIS — Z833 Family history of diabetes mellitus: Secondary | ICD-10-CM

## 2022-10-03 DIAGNOSIS — F419 Anxiety disorder, unspecified: Secondary | ICD-10-CM | POA: Diagnosis present

## 2022-10-03 DIAGNOSIS — K449 Diaphragmatic hernia without obstruction or gangrene: Secondary | ICD-10-CM | POA: Diagnosis present

## 2022-10-03 DIAGNOSIS — Z6841 Body Mass Index (BMI) 40.0 and over, adult: Secondary | ICD-10-CM | POA: Diagnosis not present

## 2022-10-03 DIAGNOSIS — E785 Hyperlipidemia, unspecified: Secondary | ICD-10-CM | POA: Diagnosis present

## 2022-10-03 DIAGNOSIS — Z808 Family history of malignant neoplasm of other organs or systems: Secondary | ICD-10-CM

## 2022-10-03 DIAGNOSIS — J690 Pneumonitis due to inhalation of food and vomit: Secondary | ICD-10-CM | POA: Diagnosis present

## 2022-10-03 DIAGNOSIS — Z7985 Long-term (current) use of injectable non-insulin antidiabetic drugs: Secondary | ICD-10-CM

## 2022-10-03 DIAGNOSIS — K2270 Barrett's esophagus without dysplasia: Secondary | ICD-10-CM | POA: Diagnosis present

## 2022-10-03 DIAGNOSIS — K219 Gastro-esophageal reflux disease without esophagitis: Secondary | ICD-10-CM | POA: Diagnosis present

## 2022-10-03 DIAGNOSIS — Z791 Long term (current) use of non-steroidal anti-inflammatories (NSAID): Secondary | ICD-10-CM | POA: Diagnosis not present

## 2022-10-03 DIAGNOSIS — I10 Essential (primary) hypertension: Secondary | ICD-10-CM | POA: Diagnosis present

## 2022-10-03 DIAGNOSIS — J45909 Unspecified asthma, uncomplicated: Secondary | ICD-10-CM | POA: Diagnosis present

## 2022-10-03 LAB — CBC WITH DIFFERENTIAL/PLATELET
Abs Immature Granulocytes: 0.12 10*3/uL — ABNORMAL HIGH (ref 0.00–0.07)
Basophils Absolute: 0.1 10*3/uL (ref 0.0–0.1)
Basophils Relative: 0 %
Eosinophils Absolute: 0 10*3/uL (ref 0.0–0.5)
Eosinophils Relative: 0 %
HCT: 43.8 % (ref 36.0–46.0)
Hemoglobin: 13.6 g/dL (ref 12.0–15.0)
Immature Granulocytes: 1 %
Lymphocytes Relative: 6 %
Lymphs Abs: 1.2 10*3/uL (ref 0.7–4.0)
MCH: 29.7 pg (ref 26.0–34.0)
MCHC: 31.1 g/dL (ref 30.0–36.0)
MCV: 95.6 fL (ref 80.0–100.0)
Monocytes Absolute: 0.7 10*3/uL (ref 0.1–1.0)
Monocytes Relative: 4 %
Neutro Abs: 17.4 10*3/uL — ABNORMAL HIGH (ref 1.7–7.7)
Neutrophils Relative %: 89 %
Platelets: 222 10*3/uL (ref 150–400)
RBC: 4.58 MIL/uL (ref 3.87–5.11)
RDW: 14.1 % (ref 11.5–15.5)
WBC: 19.6 10*3/uL — ABNORMAL HIGH (ref 4.0–10.5)
nRBC: 0 % (ref 0.0–0.2)

## 2022-10-03 LAB — URINALYSIS, ROUTINE W REFLEX MICROSCOPIC
Bilirubin Urine: NEGATIVE
Glucose, UA: NEGATIVE mg/dL
Hgb urine dipstick: NEGATIVE
Ketones, ur: NEGATIVE mg/dL
Leukocytes,Ua: NEGATIVE
Nitrite: NEGATIVE
Protein, ur: NEGATIVE mg/dL
Specific Gravity, Urine: 1.026 (ref 1.005–1.030)
pH: 5 (ref 5.0–8.0)

## 2022-10-03 LAB — COMPREHENSIVE METABOLIC PANEL
ALT: 19 U/L (ref 0–44)
AST: 18 U/L (ref 15–41)
Albumin: 3.5 g/dL (ref 3.5–5.0)
Alkaline Phosphatase: 71 U/L (ref 38–126)
Anion gap: 8 (ref 5–15)
BUN: 21 mg/dL — ABNORMAL HIGH (ref 6–20)
CO2: 23 mmol/L (ref 22–32)
Calcium: 8.9 mg/dL (ref 8.9–10.3)
Chloride: 107 mmol/L (ref 98–111)
Creatinine, Ser: 0.9 mg/dL (ref 0.44–1.00)
GFR, Estimated: >60 mL/min (ref >60)
Glucose, Bld: 120 mg/dL — ABNORMAL HIGH (ref 70–99)
Potassium: 3.9 mmol/L (ref 3.5–5.1)
Sodium: 138 mmol/L (ref 135–145)
Total Bilirubin: 0.5 mg/dL (ref 0.3–1.2)
Total Protein: 7.3 g/dL (ref 6.5–8.1)

## 2022-10-03 LAB — PROTIME-INR
INR: 1.1 (ref 0.8–1.2)
Prothrombin Time: 14.8 seconds (ref 11.4–15.2)

## 2022-10-03 LAB — LACTIC ACID, PLASMA: Lactic Acid, Venous: 1.6 mmol/L (ref 0.5–1.9)

## 2022-10-03 LAB — APTT: aPTT: 32 seconds (ref 24–36)

## 2022-10-03 MED ORDER — SORBITOL 70 % SOLN: 30.0000 mL | Freq: Every day | Status: DC | PRN

## 2022-10-03 MED ORDER — ACETAMINOPHEN 325 MG PO TABS
650.0000 mg | ORAL_TABLET | Freq: Four times a day (QID) | ORAL | Status: DC | PRN
  Administered 2022-10-03 – 2022-10-04 (×2): 650 mg via ORAL
  Filled 2022-10-03 (×2): qty 2

## 2022-10-03 MED ORDER — PIPERACILLIN-TAZOBACTAM 3.375 G IVPB
3.3750 g | Freq: Three times a day (TID) | INTRAVENOUS | Status: DC
  Administered 2022-10-04 – 2022-10-05 (×4): 3.375 g via INTRAVENOUS
  Filled 2022-10-03 (×4): qty 50

## 2022-10-03 MED ORDER — ONDANSETRON HCL 4 MG PO TABS: 4.0000 mg | ORAL_TABLET | Freq: Four times a day (QID) | ORAL | Status: DC | PRN

## 2022-10-03 MED ORDER — ALBUTEROL SULFATE HFA 108 (90 BASE) MCG/ACT IN AERS: 2.0000 | INHALATION_SPRAY | RESPIRATORY_TRACT | Status: DC | PRN

## 2022-10-03 MED ORDER — ACETAMINOPHEN 650 MG RE SUPP: 650.0000 mg | Freq: Four times a day (QID) | RECTAL | Status: DC | PRN

## 2022-10-03 MED ORDER — ENOXAPARIN SODIUM 40 MG/0.4ML IJ SOSY
40.0000 mg | PREFILLED_SYRINGE | Freq: Every day | INTRAMUSCULAR | Status: DC
  Administered 2022-10-03: 40 mg via SUBCUTANEOUS
  Filled 2022-10-03: qty 0.4

## 2022-10-03 MED ORDER — ALBUTEROL SULFATE (2.5 MG/3ML) 0.083% IN NEBU: 2.5000 mg | INHALATION_SOLUTION | RESPIRATORY_TRACT | Status: DC | PRN

## 2022-10-03 MED ORDER — LACTATED RINGERS IV BOLUS (SEPSIS)
1000.0000 mL | Freq: Once | INTRAVENOUS | Status: AC
  Administered 2022-10-03: 1000 mL via INTRAVENOUS

## 2022-10-03 MED ORDER — PIPERACILLIN-TAZOBACTAM 3.375 G IVPB 30 MIN
3.3750 g | Freq: Once | INTRAVENOUS | Status: AC
  Administered 2022-10-03: 3.375 g via INTRAVENOUS
  Filled 2022-10-03: qty 50

## 2022-10-03 MED ORDER — ONDANSETRON HCL 4 MG/2ML IJ SOLN
4.0000 mg | Freq: Four times a day (QID) | INTRAMUSCULAR | Status: DC | PRN
  Administered 2022-10-05: 4 mg via INTRAVENOUS
  Filled 2022-10-03: qty 2

## 2022-10-03 MED ORDER — VANCOMYCIN HCL 10 G IV SOLR
2500.0000 mg | Freq: Once | INTRAVENOUS | Status: DC
  Administered 2022-10-03: 2500 mg via INTRAVENOUS
  Filled 2022-10-03: qty 40

## 2022-10-03 NOTE — ED Provider Notes (Signed)
[place] EMERGENCY DEPARTMENT AT [facility] Provider Note   CSN: [id number] Arrival date & time: 10/03/22  1409     History  Chief Complaint  Patient presents with  . Shortness of Breath    [name] is a 52 y.o. female.  HPI   52 year old female with past medical history of hiatal hernia and aspiration presents emergency department with concern for shortness of breath and possible aspiration overnight.  Patient states that she woke up feeling unwell and short of breath this morning.  Was noted to have emesis on her chest.  She went to a scheduled appointment today was noted to be short of breath so they called EMS.  They noted that she was hypoxic, placed her on supplemental oxygen and gave treatment and route.  Patient is complaining of left-sided chest discomfort and shortness of breath but states she is otherwise been in her usual state of health.  Denies any fever, chills, lower extremity swelling.  Home Medications Prior to Admission medications   Medication Sig Start Date End Date Taking? Authorizing Provider  albuterol (VENTOLIN HFA) 108 (90 Base) MCG/ACT inhaler Inhale 1-2 puffs into the lungs every 6 (six) hours as needed for wheezing or shortness of breath. 04/15/21   Long, Arlyss Repress, MD  ALPRAZolam Prudy Feeler) 0.5 MG tablet Take 0.5 mg by mouth 3 (three) times daily as needed for anxiety.  05/06/18   [provider]  atorvastatin (LIPITOR) 40 MG tablet Take 1 tablet (40 mg total) by mouth daily. 11/04/21   Marinda Elk, MD  azelastine (ASTELIN) 0.1 % nasal spray Place into the nose. 11/13/21   [provider]  azelastine (ASTELIN) 137 MCG/SPRAY nasal spray Place 2 sprays into both nostrils 2 (two) times daily as needed for rhinitis. Use in each nostril as directed    [provider]  baclofen (LIORESAL) 10 MG tablet Take 10 mg by mouth daily as needed for muscle spasms.    [provider]  Black Cohosh 540 MG CAPS Take by  mouth.    [provider]  buPROPion (WELLBUTRIN XL) 300 MG 24 hr tablet Take 300 mg by mouth daily.    [provider]  celecoxib (CELEBREX) 200 MG capsule Take by mouth. 06/09/22   [provider]  cetirizine (ZYRTEC) 10 MG tablet Take 10 mg by mouth daily as needed for allergies.     [provider]  clopidogrel (PLAVIX) 75 MG tablet Take by mouth. 04/17/22   [provider]  diltiazem (CARDIZEM CD) 120 MG 24 hr capsule Take 120 mg by mouth daily. 07/21/21   [provider]  gabapentin (NEURONTIN) 300 MG capsule Take 300 mg by mouth 3 (three) times daily. 01/22/19   [provider]  magnesium oxide (MAG-OX) 400 (240 Mg) MG tablet Take 400 mg by mouth daily.    [provider]  mirtazapine (REMERON) 15 MG tablet Take by mouth. 04/28/17   [provider]  montelukast (SINGULAIR) 10 MG tablet Take 10 mg by mouth at bedtime.  02/04/17   [provider]  nebivolol (BYSTOLIC) 5 MG tablet Take 5 mg by mouth daily.    [provider]  pantoprazole (PROTONIX) 40 MG tablet Take 40 mg by mouth daily as needed (acid reflux).    [provider]  promethazine-dextromethorphan (PROMETHAZINE-DM) 6.25-15 MG/5ML syrup Take by mouth. 04/29/22   [provider]  SUMAtriptan (IMITREX) 20 MG/ACT nasal spray Place 1 spray (20 mg total) into the  nose as needed for migraine or headache. May repeat in 2 hours if headache persists or recurs. 01/08/22   Micki Riley, MD  topiramate (TOPAMAX) 50 MG tablet TAKE 1 TABLET BY MOUTH TWICE A DAY 05/13/22   Micki Riley, MD  traZODone (DESYREL) 150 MG tablet Take 150 mg by mouth at bedtime.    [provider]  traZODone (DESYREL) 150 MG tablet Take by mouth. 12/19/19   [provider]  ustekinumab Marcy Panning) 90 MG/ML SOSY injection Inject 90 mg into the skin every 3 (three) months. 11/16/19   [provider]  venlafaxine XR (EFFEXOR-XR) 150 MG  24 hr capsule Take 150 mg by mouth daily with breakfast.  05/04/18   [provider]      Allergies    Adhesive [tape], Influenza vaccines, and Lisinopril-hydrochlorothiazide    Review of Systems   Review of Systems  Constitutional:  Positive for fatigue. Negative for fever.  Respiratory:  Positive for shortness of breath.   Cardiovascular:  Positive for chest pain. Negative for palpitations and leg swelling.  Gastrointestinal:  Negative for abdominal pain, diarrhea and vomiting.  Skin:  Negative for rash.  Neurological:  Negative for headaches.    Physical Exam Updated Vital Signs BP 118/68   Pulse (!) 107   Temp 99.2 F (37.3 C) (Oral)   Resp (!) 22   SpO2 93%  Physical Exam Vitals and nursing note reviewed.  Constitutional:      General: She is not in acute distress.    Appearance: Normal appearance.  HENT:     Head: Normocephalic.     Mouth/Throat:     Mouth: Mucous membranes are moist.  Cardiovascular:     Rate and Rhythm: Tachycardia present.  Pulmonary:     Effort: Pulmonary effort is normal. Tachypnea present. No respiratory distress.     Breath sounds: Examination of the right-lower field reveals decreased breath sounds and rhonchi. Examination of the left-lower field reveals decreased breath sounds and rhonchi. Decreased breath sounds and rhonchi present.  Abdominal:     Palpations: Abdomen is soft.     Tenderness: There is no abdominal tenderness.  Skin:    General: Skin is warm.  Neurological:     Mental Status: She is alert and oriented to person, place, and time. Mental status is at baseline.  Psychiatric:        Mood and Affect: Mood normal.    ED Results / Procedures / Treatments   Labs (all labs ordered are listed, but only abnormal results are displayed) Labs Reviewed  CULTURE, BLOOD (ROUTINE X 2)  CULTURE, BLOOD (ROUTINE X 2)  CBC WITH DIFFERENTIAL/PLATELET  LACTIC ACID, PLASMA  LACTIC ACID, PLASMA  COMPREHENSIVE METABOLIC PANEL     EKG EKG Interpretation Date/Time:  Friday October 03 2022 14:23:14 EDT Ventricular Rate:  110 PR Interval:  151 QRS Duration:  93 QT Interval:  316 QTC Calculation: 428 R Axis:   65  Text Interpretation: Sinus tachycardia Confirmed by Coralee Pesa [phone]) on 10/03/2022 3:01:40 PM  Radiology DG Chest 2 View  Result Date: 10/03/2022 CLINICAL DATA:  Shortness of breath. EXAM: CHEST - 2 VIEW COMPARISON:  04/15/2021 FINDINGS: Lungs are adequately inflated with airspace opacification over the left base likely a pneumonia. No definite effusion. Right lung is clear. Mild cardiomegaly. Remainder of the exam is unchanged. IMPRESSION: 1. Airspace opacification over the left base likely a pneumonia. 2. Mild cardiomegaly. Electronically Signed   By: Elberta Fortis M.D.  On: 10/03/2022 15:12    Procedures .Critical Care  Performed by: Rozelle Logan, DO Authorized by: Rozelle Logan, DO   Critical care provider statement:    Critical care time (minutes):  45   Critical care was necessary to treat or prevent imminent or life-threatening deterioration of the following conditions:  Sepsis   Critical care was time spent personally by me on the following activities:  Development of treatment plan with patient or surrogate, discussions with consultants, evaluation of patient's response to treatment, examination of patient, ordering and review of laboratory studies, ordering and review of radiographic studies, ordering and performing treatments and interventions, pulse oximetry, re-evaluation of patient's condition and review of old charts   I assumed direction of critical care for this patient from another provider in my specialty: no     Care discussed with: admitting provider       Medications Ordered in ED Medications  albuterol (VENTOLIN HFA) 108 (90 Base) MCG/ACT inhaler 2 puff (has no administration in time range)    ED Course/ Medical Decision Making/ A&P                              Medical Decision Making Amount and/or Complexity of Data Reviewed Labs: ordered. Radiology: ordered.  Risk Prescription drug management.   52 year old female presents emergency department shortness of breath, hypoxia after waking up with emesis in her chest.  History of aspiration pneumonia in the past.  Has otherwise been in her usual state of health before going to bed last night.  She is tachycardic and tachypneic on arrival, requiring 2 to 3 L nasal cannula without any acute respiratory distress.  Blood work is pending, chest x-ray shows concern for opacity on the left, concerning for possible aspiration pneumonia.  Septic orders placed, Vanco and Zosyn per pharmacy order placed.  Patient will require admission after evaluation of septic labs.  Otherwise blood pressure is stable and she is currently sitting up and conversational.         Final Clinical Impression(s) / ED Diagnoses Final diagnoses:  None    Rx / DC Orders ED Discharge Orders     None         Rozelle Logan, DO 10/03/22 1714

## 2022-10-03 NOTE — ED Triage Notes (Signed)
Patient presents from her MD office due to shortness of breath. She tends to vomit in her sleep with a history of aspiration. Today she woke up with vomit on her. EMS noted rhonchi in upper and lower lung sounds. She has had 6 ng Albuterol, 1 mg of Atrovent. Around 0800 today she had 1500 mg of Tylenol. EMS administered 250 ml of fluid.    HX GERD,  hiatal hernia  EMS vitals: 94% SPO2 on room air 112 palpated BP 150 CBG 99% SPO2 room air post non re breather

## 2022-10-03 NOTE — ED Notes (Signed)
ED TO INPATIENT HANDOFF REPORT  Name/Age/Gender [name] 52 y.o. female  Code Status    Code Status Orders  (From admission, onward)           Start     Ordered   10/03/22 1843  Full code  Continuous       Question:  By:  Answer:  Consent: discussion documented in EHR   10/03/22 1844           Code Status History     Date Active Date Inactive Code Status Order ID Comments User Context   11/03/2021 0258 11/04/2021 1821 Full Code [id number]  Darlin Drop, DO Inpatient   02/06/2019 1634 02/10/2019 1759 Full Code [id number]  Clydia Llano, MD Inpatient   01/24/2019 1158 01/27/2019 1444 Full Code [id number]  Peggye Form, DO Inpatient   09/24/2018 0011 09/25/2018 1853 Full Code [id number]  Briscoe Deutscher, MD ED   03/24/2017 0451 03/27/2017 1549 Full Code [id number]  Kerry Hough, PA-C Inpatient   03/23/2017 1953 03/24/2017 0231 Full Code [id number]  Lavera Guise, MD ED   01/09/2016 1733 01/10/2016 1301 Full Code [id number]  Dillingham, [name], DO Inpatient       Home/SNF/Other Home  Chief Complaint Aspiration pneumonia (HCC) [J69.0]  Level of Care/Admitting Diagnosis ED Disposition     ED Disposition  Admit   Condition  --   Comment  Hospital Area: [facility] [[id number]]  Level of Care: Med-Surg [16]  May admit patient to [facility] or [facility] if equivalent level of care is available:: Yes  Covid Evaluation: Asymptomatic - no recent exposure (last 10 days) testing not required  Diagnosis: Aspiration pneumonia [facility]) [742595]  Admitting Physician: Buena Irish [[id number]]  Attending Physician: Buena Irish [[id number]]  Certification:: I certify this patient will need inpatient services for at least 2 midnights  Estimated Length of Stay: 3          Medical History Past Medical History:  Diagnosis Date   Anxiety    Arthritis    joints   Asthma    Endometriosis    Fat necrosis of breast    right   GERD  (gastroesophageal reflux disease)    History of hiatal hernia    s/p repair few times   History of suicide attempt 03/2017   Hypertension    MDD (major depressive disorder)    Panniculitis    Pneumonia 10/2018   in hospital with pneumonia   PONV (postoperative nausea and vomiting)    Psoriasis    treated with Humeria    Allergies Allergies  Allergen Reactions   Adhesive [Tape]     Rash, blisters   Influenza Vaccines     States her "arm had a knot and turned red"   Lisinopril-Hydrochlorothiazide     welts    IV Location/Drains/Wounds Patient Lines/Drains/Airways Status     Active Line/Drains/Airways     Name Placement date Placement time Site Days   Peripheral IV 11/04/21 20 G 1.88" Left;Proximal Forearm 11/04/21  0915  Forearm  333            Labs/Imaging Results for orders placed or performed during the hospital encounter of 10/03/22 (from the past 48 hour(s))  CBC with Differential     Status: Abnormal   Collection Time: 10/03/22  3:23 PM  Result Value Ref Range   WBC 19.6 (H) 4.0 - 10.5 K/uL   RBC 4.58 3.87 - 5.11 MIL/uL   Hemoglobin  13.6 12.0 - 15.0 g/dL   HCT 41.3 24.4 - 01.0 %   MCV 95.6 80.0 - 100.0 fL   MCH 29.7 26.0 - 34.0 pg   MCHC 31.1 30.0 - 36.0 g/dL   RDW 27.2 53.6 - 64.4 %   Platelets 222 150 - 400 K/uL   nRBC 0.0 0.0 - 0.2 %   Neutrophils Relative % 89 %   Neutro Abs 17.4 (H) 1.7 - 7.7 K/uL   Lymphocytes Relative 6 %   Lymphs Abs 1.2 0.7 - 4.0 K/uL   Monocytes Relative 4 %   Monocytes Absolute 0.7 0.1 - 1.0 K/uL   Eosinophils Relative 0 %   Eosinophils Absolute 0.0 0.0 - 0.5 K/uL   Basophils Relative 0 %   Basophils Absolute 0.1 0.0 - 0.1 K/uL   Immature Granulocytes 1 %   Abs Immature Granulocytes 0.12 (H) 0.00 - 0.07 K/uL    Comment: Performed at [facility], [address], [place], [place] [address]  Comprehensive metabolic panel     Status: Abnormal   Collection Time: 10/03/22  3:23 PM  Result Value Ref  Range   Sodium 138 135 - 145 mmol/L   Potassium 3.9 3.5 - 5.1 mmol/L   Chloride 107 98 - 111 mmol/L   CO2 23 22 - 32 mmol/L   Glucose, Bld 120 (H) 70 - 99 mg/dL    Comment: Glucose reference range applies only to samples taken after fasting for at least 8 hours.   BUN 21 (H) 6 - 20 mg/dL   Creatinine, Ser 2.59 0.44 - 1.00 mg/dL   Calcium 8.9 8.9 - 56.3 mg/dL   Total Protein 7.3 6.5 - 8.1 g/dL   Albumin 3.5 3.5 - 5.0 g/dL   AST 18 15 - 41 U/L   ALT 19 0 - 44 U/L   Alkaline Phosphatase 71 38 - 126 U/L   Total Bilirubin 0.5 0.3 - 1.2 mg/dL   GFR, Estimated >87 >56 mL/min    Comment: (NOTE) Calculated using the CKD-EPI Creatinine Equation (2021)    Anion gap 8 5 - 15    Comment: Performed at [facility], [address] [place], [place] [address]  Culture, blood (routine x 2)     Status: None (Preliminary result)   Collection Time: 10/03/22  4:39 PM   Specimen: BLOOD  Result Value Ref Range   Specimen Description      BLOOD SITE NOT SPECIFIED Performed at [facility], [address], [place], [place] [address]    Special Requests      BOTTLES DRAWN AEROBIC AND ANAEROBIC Blood Culture adequate volume Performed at [facility], [address] [place], [place] [address]    Culture PENDING    Report Status PENDING   Protime-INR     Status: None   Collection Time: 10/03/22  6:23 PM  Result Value Ref Range   Prothrombin Time 14.8 11.4 - 15.2 seconds   INR 1.1 0.8 - 1.2    Comment: (NOTE) INR goal varies based on device and disease states. Performed at [facility], [address], [place], [place] [address]   APTT     Status: None   Collection Time: 10/03/22  6:23 PM  Result Value Ref Range   aPTT 32 24 - 36 seconds    Comment: Performed at [facility], [address] [place], [place] [address]   DG Chest 2 View  Result Date:  10/03/2022 CLINICAL DATA:  Shortness of breath. EXAM: CHEST -  2 VIEW COMPARISON:  04/15/2021 FINDINGS: Lungs are adequately inflated with airspace opacification over the left base likely a pneumonia. No definite effusion. Right lung is clear. Mild cardiomegaly. Remainder of the exam is unchanged. IMPRESSION: 1. Airspace opacification over the left base likely a pneumonia. 2. Mild cardiomegaly. Electronically Signed   By: Elberta Fortis M.D.   On: 10/03/2022 15:12    Pending Labs Unresulted Labs (From admission, onward)     Start     Ordered   10/04/22 0500  Basic metabolic panel  Tomorrow morning,   R        10/03/22 1844   10/04/22 0500  CBC  Tomorrow morning,   R        10/03/22 1844   10/04/22 0500  Magnesium  Tomorrow morning,   R        10/03/22 1844   10/03/22 1842  HIV Antibody (routine testing w rflx)  (HIV Antibody (Routine testing w reflex) panel)  Once,   R        10/03/22 1844   10/03/22 1713  Urinalysis, Routine w reflex microscopic -Urine, Clean Catch  (Undifferentiated presentation (screening labs and basic nursing orders))  ONCE - URGENT,   URGENT       Question:  Specimen Source  Answer:  Urine, Clean Catch   10/03/22 1713   10/03/22 1523  Lactic acid, plasma  Now then every 2 hours,   R (with STAT occurrences)      10/03/22 1522   10/03/22 1523  Culture, blood (routine x 2)  BLOOD CULTURE X 2,   R (with STAT occurrences)      10/03/22 1522            Vitals/Pain Today's Vitals   10/03/22 1424 10/03/22 1426 10/03/22 1442 10/03/22 1815  BP:      Pulse: (!) 108 (!) 110 (!) 107 (!) 107  Resp: 19 17 (!) 22 19  Temp: 99.2 F (37.3 C)   98.2 F (36.8 C)  TempSrc: Oral     SpO2: 91% 91% 93% 93%  PainSc:  6       Isolation Precautions No active isolations  Medications Medications  albuterol (VENTOLIN HFA) 108 (90 Base) MCG/ACT inhaler 2 puff (has no administration in time range)  enoxaparin (LOVENOX) injection 40 mg (has no administration in time range)  sorbitol 70 % solution 30 mL (has no administration in time range)   acetaminophen (TYLENOL) tablet 650 mg (has no administration in time range)    Or  acetaminophen (TYLENOL) suppository 650 mg (has no administration in time range)  ondansetron (ZOFRAN) tablet 4 mg (has no administration in time range)    Or  ondansetron (ZOFRAN) injection 4 mg (has no administration in time range)  lactated ringers bolus 1,000 mL (1,000 mLs Intravenous New Bag/Given 10/03/22 1730)  piperacillin-tazobactam (ZOSYN) IVPB 3.375 g (3.375 g Intravenous New Bag/Given 10/03/22 1825)    Mobility walks with person assist

## 2022-10-03 NOTE — Progress Notes (Signed)
Pharmacy Antibiotic Note  [name] is a 52 y.o. female admitted on 10/03/2022 with aspiration pneumonia.  Pharmacy has been consulted for Zosyn dosing.  Plan: Zosyn 3.375g IV Q8H infused over 4hrs. Follow up renal function, culture results, and clinical course.      Temp (24hrs), Avg:98.7 F (37.1 C), Min:98.2 F (36.8 C), Max:99.2 F (37.3 C)  Recent Labs  Lab 10/03/22 1523  WBC 19.6*  CREATININE 0.90    CrCl cannot be calculated (Unknown ideal weight.).    Allergies  Allergen Reactions   Adhesive [Tape]     Rash, blisters   Influenza Vaccines     States her "arm had a knot and turned red"   Lisinopril-Hydrochlorothiazide     welts    Antimicrobials this admission:  6/28 zosyn >> 6/28 Vanc x1   Dose adjustments this admission:      Microbiology results:  6/28 BCx:  6/28 MRSA PCR:   Thank you for allowing pharmacy to be a part of this patient's care.  Lynann Beaver PharmD, BCPS [facility] main pharmacy [phone] 10/03/2022 7:22 PM

## 2022-10-03 NOTE — Progress Notes (Signed)
A consult was received from an ED physician for Vancomycin, Zosyn per pharmacy dosing.  The patient's profile has been reviewed for ht/wt/allergies/indication/available labs.   Most recent weight documented 335 lbs (152 kg) on 06/26/22 A one time order has been placed for Vancomycin 2.5g, Zosyn 3.375g IV x1.  Further antibiotics/pharmacy consults should be ordered by admitting physician if indicated.                       Thank you,   Lynann Beaver PharmD, BCPS [facility] main pharmacy [phone] 10/03/2022 5:14 PM

## 2022-10-04 DIAGNOSIS — J690 Pneumonitis due to inhalation of food and vomit: Secondary | ICD-10-CM

## 2022-10-04 DIAGNOSIS — J9601 Acute respiratory failure with hypoxia: Secondary | ICD-10-CM

## 2022-10-04 LAB — CBC
HCT: 40.6 % (ref 36.0–46.0)
Hemoglobin: 12.5 g/dL (ref 12.0–15.0)
MCH: 29.6 pg (ref 26.0–34.0)
MCHC: 30.8 g/dL (ref 30.0–36.0)
MCV: 96 fL (ref 80.0–100.0)
Platelets: 200 10*3/uL (ref 150–400)
RBC: 4.23 MIL/uL (ref 3.87–5.11)
RDW: 14.4 % (ref 11.5–15.5)
WBC: 17.1 10*3/uL — ABNORMAL HIGH (ref 4.0–10.5)
nRBC: 0 % (ref 0.0–0.2)

## 2022-10-04 LAB — HIV ANTIBODY (ROUTINE TESTING W REFLEX): HIV Screen 4th Generation wRfx: NONREACTIVE

## 2022-10-04 LAB — CULTURE, BLOOD (ROUTINE X 2): Special Requests: ADEQUATE

## 2022-10-04 LAB — BASIC METABOLIC PANEL
Anion gap: 6 (ref 5–15)
BUN: 16 mg/dL (ref 6–20)
CO2: 25 mmol/L (ref 22–32)
Calcium: 8.3 mg/dL — ABNORMAL LOW (ref 8.9–10.3)
Chloride: 106 mmol/L (ref 98–111)
Creatinine, Ser: 0.81 mg/dL (ref 0.44–1.00)
GFR, Estimated: >60 mL/min (ref >60)
Glucose, Bld: 109 mg/dL — ABNORMAL HIGH (ref 70–99)
Potassium: 3.7 mmol/L (ref 3.5–5.1)
Sodium: 137 mmol/L (ref 135–145)

## 2022-10-04 LAB — MAGNESIUM: Magnesium: 2.1 mg/dL (ref 1.7–2.4)

## 2022-10-04 MED ORDER — ENOXAPARIN SODIUM 80 MG/0.8ML IJ SOSY
80.0000 mg | PREFILLED_SYRINGE | Freq: Every day | INTRAMUSCULAR | Status: DC
  Administered 2022-10-04: 80 mg via SUBCUTANEOUS
  Filled 2022-10-04: qty 0.8

## 2022-10-04 MED ORDER — TOPIRAMATE 25 MG PO TABS
50.0000 mg | ORAL_TABLET | Freq: Two times a day (BID) | ORAL | Status: DC
  Administered 2022-10-04 – 2022-10-05 (×3): 50 mg via ORAL
  Filled 2022-10-04 (×3): qty 2

## 2022-10-04 MED ORDER — PANTOPRAZOLE SODIUM 40 MG PO TBEC
40.0000 mg | DELAYED_RELEASE_TABLET | Freq: Two times a day (BID) | ORAL | Status: DC
  Administered 2022-10-04 – 2022-10-05 (×3): 40 mg via ORAL
  Filled 2022-10-04 (×3): qty 1

## 2022-10-04 MED ORDER — BUPROPION HCL ER (XL) 300 MG PO TB24
300.0000 mg | ORAL_TABLET | Freq: Every day | ORAL | Status: DC
  Administered 2022-10-04 – 2022-10-05 (×2): 300 mg via ORAL
  Filled 2022-10-04 (×2): qty 1

## 2022-10-04 MED ORDER — NEBIVOLOL HCL 5 MG PO TABS
5.0000 mg | ORAL_TABLET | Freq: Every day | ORAL | Status: DC
  Administered 2022-10-04 – 2022-10-05 (×2): 5 mg via ORAL
  Filled 2022-10-04 (×2): qty 1

## 2022-10-04 MED ORDER — ATORVASTATIN CALCIUM 40 MG PO TABS
40.0000 mg | ORAL_TABLET | Freq: Every day | ORAL | Status: DC
  Administered 2022-10-04 – 2022-10-05 (×2): 40 mg via ORAL
  Filled 2022-10-04 (×2): qty 1

## 2022-10-04 MED ORDER — CELECOXIB 200 MG PO CAPS
200.0000 mg | ORAL_CAPSULE | Freq: Every day | ORAL | Status: DC
  Administered 2022-10-04 – 2022-10-05 (×2): 200 mg via ORAL
  Filled 2022-10-04 (×2): qty 1

## 2022-10-04 MED ORDER — DILTIAZEM HCL ER COATED BEADS 120 MG PO CP24
120.0000 mg | ORAL_CAPSULE | Freq: Every day | ORAL | Status: DC
  Administered 2022-10-04 – 2022-10-05 (×2): 120 mg via ORAL
  Filled 2022-10-04 (×2): qty 1

## 2022-10-04 MED ORDER — QUETIAPINE FUMARATE 50 MG PO TABS
150.0000 mg | ORAL_TABLET | Freq: Every day | ORAL | Status: DC
  Administered 2022-10-04: 150 mg via ORAL
  Filled 2022-10-04: qty 3

## 2022-10-04 MED ORDER — ADULT MULTIVITAMIN W/MINERALS CH
1.0000 | ORAL_TABLET | Freq: Every day | ORAL | Status: DC
  Administered 2022-10-04 – 2022-10-05 (×2): 1 via ORAL
  Filled 2022-10-04 (×2): qty 1

## 2022-10-04 MED ORDER — LORATADINE 10 MG PO TABS
10.0000 mg | ORAL_TABLET | Freq: Every day | ORAL | Status: DC
  Administered 2022-10-04: 10 mg via ORAL
  Filled 2022-10-04: qty 1

## 2022-10-04 MED ORDER — GABAPENTIN 300 MG PO CAPS
300.0000 mg | ORAL_CAPSULE | Freq: Three times a day (TID) | ORAL | Status: DC
  Administered 2022-10-04 – 2022-10-05 (×4): 300 mg via ORAL
  Filled 2022-10-04 (×4): qty 1

## 2022-10-04 MED ORDER — MAGNESIUM OXIDE -MG SUPPLEMENT 400 (240 MG) MG PO TABS
400.0000 mg | ORAL_TABLET | Freq: Every day | ORAL | Status: DC
  Administered 2022-10-04 – 2022-10-05 (×2): 400 mg via ORAL
  Filled 2022-10-04 (×2): qty 1

## 2022-10-04 MED ORDER — ALPRAZOLAM 0.5 MG PO TABS: 0.5000 mg | ORAL_TABLET | Freq: Three times a day (TID) | ORAL | Status: DC | PRN

## 2022-10-04 MED ORDER — VENLAFAXINE HCL ER 150 MG PO CP24
150.0000 mg | ORAL_CAPSULE | Freq: Every day | ORAL | Status: DC
  Administered 2022-10-04 – 2022-10-05 (×2): 150 mg via ORAL
  Filled 2022-10-04 (×2): qty 1

## 2022-10-04 MED ORDER — LEVOCETIRIZINE DIHYDROCHLORIDE 5 MG PO TABS: 5.0000 mg | ORAL_TABLET | Freq: Every evening | ORAL | Status: DC

## 2022-10-04 NOTE — Progress Notes (Signed)
PROGRESS NOTE  [name]  DOB: 1970-04-11  PCP: Rick Duff, [place] UJW:[id number]  DOA: 10/03/2022  LOS: 1 day  Hospital Day: 2  Brief narrative: [name] is a 52 y.o. female with PMH significant for morbid obesity, major depression, anxiety, migraine, history of suicidal attempt, hiatal hernia, GERD, Barrett's esophagus, psoriatic arthritis.   Patient states that she had a lap band procedure followed by trouble with wound dehiscence.  She states she continued to gain weight since then and also started vomiting in her sleep.  She does keep the head of her bed elevated to prevent it though.  She had a EGD done at [facility] few weeks ago, noted to have Barrett's esophagus and is currently on Protonix twice daily. She is currently being worked up at [facility] for possible gastric bypass and hernia repair combination surgery.  6/28, she woke up and noticed vomitus in her pajamas.  She was also short of breath and had a fever.  She went to see her PCP and was noted to be hypoxic and hence sent to the ED by EMS.  In the ED, patient had a temperature of 99.2, tachycardic to 100s, required 2 L oxygen by nasal cannula Labs showed WBC count elevated to 19.6, lactic acid level normal, renal function normal Chest x-ray showed left base pneumonia Started on IV antibiotics Admitted to [facility]  Subjective: Patient was seen and examined this morning.  Pleasant middle-aged Caucasian female.  Sitting up at the edge of the bed.  Not in distress.  Feels better than at presentation. Chart reviewed. Overnight no recurrence of fever.  Tachycardia improving, hemodynamically stable, breathing on room air this morning Labs from this morning with WC count slightly better at 17.1  Assessment and plan: Aspiration pneumonia Per history, patient vomited in her sleep and probably aspirated.  Reports fever at home and hypoxia at MDs office Since arrival in the ED, patient has not had a documented fever or  hypoxic event.  Does not meet criteria for sepsis Currently on IV Zosyn for aspiration pneumonia Gradually improving.  WBC count slightly better today. Not requiring oxygen at rest.  Check ambulatory oxygen requirement Recent Labs  Lab 10/03/22 1523 10/03/22 2157 10/04/22 0728  WBC 19.6*  --  17.1*  LATICACIDVEN  --  1.6  --    Hiatal hernia Barrett's esophagus Failed lap band procedure Per history, underwent EGD few weeks ago at [facility]. Currently on Protonix twice daily.  HTN PTA meds-Bystolic 5 mg daily Continue the same.  HLD/TIA Continue Plavix and TIA.  Morbid Obesity  Body mass index is 48.27 kg/m. Patient has been advised to make an attempt to improve diet and exercise patterns to aid in weight loss. PTA meds-monitor 2.5 mg weekly  Major depression, anxiety, migraine history of suicidal attempt Currently denies suicidal ideation PTA meds-bupropion 300 mg daily, Seroquel 150 mg nightly, Effexor 150 mg daily Neurontin 300 mg 3 times daily, Topamax 50 mg twice daily, sumatriptan as needed, Xanax as needed Continue the same.  H/o psoriatic arthritis PTA meds-celecoxib 200 mg daily, Stelara 90 mg every 3 months   Mobility: Encourage ambulation  Goals of care   Code Status: Full Code    DVT prophylaxis:  enoxaparin (LOVENOX) injection 40 mg Start: 10/03/22 2200   Antimicrobials: IV Zosyn Fluid: Not on IV hydration Consultants: None Family Communication: None at bedside  Status: Inpatient Level of care:  Med-Surg   Patient from: Home Anticipated d/c to: Pending clinical course Needs  to continue in-hospital care:  On IV Zosyn       Diet:  Diet Order             Diet regular Room service appropriate? Yes; Fluid consistency: Thin  Diet effective now                   Scheduled Meds:  atorvastatin  40 mg Oral Daily   buPROPion  300 mg Oral Daily   celecoxib  200 mg Oral Daily   diltiazem  120 mg Oral Daily   enoxaparin (LOVENOX) injection   40 mg Subcutaneous QHS   gabapentin  300 mg Oral TID   loratadine  10 mg Oral q1800   magnesium oxide  400 mg Oral Daily   multivitamin with minerals  1 tablet Oral Daily   nebivolol  5 mg Oral Daily   pantoprazole  40 mg Oral BID AC   QUEtiapine  150 mg Oral QHS   topiramate  50 mg Oral BID   venlafaxine XR  150 mg Oral Q breakfast    PRN meds: acetaminophen **OR** acetaminophen, albuterol, ALPRAZolam, ondansetron **OR** ondansetron (ZOFRAN) IV, sorbitol   Infusions:   piperacillin-tazobactam (ZOSYN)  IV 3.375 g (10/04/22 1146)    Antimicrobials: Anti-infectives (From admission, onward)    Start     Dose/Rate Route Frequency Ordered Stop   10/04/22 0200  piperacillin-tazobactam (ZOSYN) IVPB 3.375 g        3.375 g 12.5 mL/hr over 240 Minutes Intravenous Every 8 hours 10/03/22 1924     10/03/22 1800  vancomycin (VANCOCIN) 2,500 mg in sodium chloride 0.9 % 500 mL IVPB  Status:  Discontinued        2,500 mg 262.5 mL/hr over 120 Minutes Intravenous  Once 10/03/22 1717 10/03/22 2057   10/03/22 1730  piperacillin-tazobactam (ZOSYN) IVPB 3.375 g        3.375 g 100 mL/hr over 30 Minutes Intravenous  Once 10/03/22 1717 10/03/22 1855       Nutritional status:  Body mass index is 48.27 kg/m.          Objective: Vitals:   10/04/22 1135 10/04/22 1359  BP: 117/73 124/77  Pulse: 89 89  Resp:  16  Temp:  98.2 F (36.8 C)  SpO2:  94%   No intake or output data in the 24 hours ending 10/04/22 1452 Filed Weights   10/03/22 1953 10/03/22 2050  Weight: (!) 157 kg (!) 157 kg   Weight change:  Body mass index is 48.27 kg/m.   Physical Exam: General exam: Pleasant, middle-aged Caucasian female. Skin: No rashes, lesions or ulcers. HEENT: Atraumatic, normocephalic, no obvious bleeding Lungs: Clear to auscultation bilaterally.  Coughs on deep breathing CVS: Regular rate and rhythm, no murmur GI/Abd soft, mild epigastric tenderness present, nondistended, bowel sound  present CNS: Alert, awake, oriented x 3 Psychiatry: Mood appropriate Extremities: No pedal edema, no calf tenderness  Data Review: I have personally reviewed the laboratory data and studies available.  F/u labs ordered Unresulted Labs (From admission, onward)     Start     Ordered   Unscheduled  CBC with Differential/Platelet  Tomorrow morning,   R        10/04/22 1452   Unscheduled  Basic metabolic panel  Tomorrow morning,   R        10/04/22 1452            Total time spent in review of labs and imaging, patient evaluation, formulation of  plan, documentation and communication with family: 45 minutes  Signed, Lorin Glass, MD [facility] 10/04/2022

## 2022-10-04 NOTE — H&P (Signed)
History and Physical    Patient: [name]:[id number] DOB: Jul 13, 1970 DOA: 10/03/2022 DOS: the patient was seen and examined on 10/04/2022 PCP: Rick Duff, PA-C  Patient coming from: Home  Chief Complaint:  Chief Complaint  Patient presents with   Shortness of Breath   HPI: [name] is a 52 y.o. female with medical history significant for migraines, morbid obesity, new diagnosis of Barrett's esophagitis, hypertension, and psoriatic arthritis reports that she woke up with vomit on her pajamas.  She was very short of breath when she woke up and she also had a fever.  She called her doctor and went into a visit this afternoon but at the office she was noted to be hypoxic so EMS was called and she was sent to the emergency room from the doctor's office. Patient tells me that sometimes will vomit in her sleep.  He has a known hiatal hernia and is morbidly obese.  She is currently being worked up at [facility] for a possible gastric bypass and hernia repair combination surgery.  The patient has an extensive surgery history.  She had a lap band procedure in the distant past and also had hernia surgery.  She had trouble with wound dehiscence.  The Lap-Band surgery and the hiatal hernia repair failed and she gained weight and since that has happened she has had more trouble with this hiatal hernia and vomiting in her sleep.  She does keep the head of her bed elevated.  She actually had a EGD less than a month ago at [facility] and she was given the diagnosis of Barrett's esophagus.  She is now taking Protonix twice daily.  In the emergency department the patient was found to be hypoxic and was placed on 2 L O2 nasal cannula.  Her workup included a chest x-ray which revealed a left lower lobe infiltrate.  Her white blood cell count was elevated and she had a low-grade fever. The patient will be admitted for oxygen and antibiotics for this aspiration pneumonia.         Review of Systems:  As mentioned in the history of present illness. All other systems reviewed and are negative. Past Medical History:  Diagnosis Date   Anxiety    Arthritis    joints   Asthma    Endometriosis    Fat necrosis of breast    right   GERD (gastroesophageal reflux disease)    History of hiatal hernia    s/p repair few times   History of suicide attempt 03/2017   Hypertension    MDD (major depressive disorder)    Panniculitis    Pneumonia 10/2018   in hospital with pneumonia   PONV (postoperative nausea and vomiting)    Psoriasis    treated with Humeria   Past Surgical History:  Procedure Laterality Date   ACHILLES TENDON REPAIR Left 01-11-2015  @[facility]    BREAST CYST EXCISION Right 01/24/2019   Procedure: EXCISIN OF RIGHT BREAST FAT NECROSIS;  Surgeon: Peggye Form, DO;  Location: [facility];  Service: Plastics;  Laterality: Right;   BREAST REDUCTION SURGERY Bilateral 01/09/2016   Procedure: MAMMARY REDUCTION  (BREAST) BILATERAL;  Surgeon: Peggye Form, DO;  Location: [facility];  Service: Plastics;  Laterality: Bilateral;   CARPOMETACARPAL (CMC) FUSION OF THUMB Right 05-04-2015   @[facility]    COMBINED HYSTEROSCOPY DIAGNOSTIC / D&C  06-23-2003     dr Ambrose Mantle @[facility]    D & C HYSTERSCOPY /  DX LAPAROSCOPY  CONVERSION LAPAROTOMY LYSIS ADHESIONS WITH RIGHT SALPINECTOMY  12-26-2003    dr Edward Jolly  @[facility]    DEBRIDEMENT AND CLOSURE WOUND N/A 02/07/2019   Procedure: DEBRIDEMENT OF ABDOMINAL WOUND, PLACEMENT OF A-CELL, PLACEMENT OF WOUND VAC;  Surgeon: Peggye Form, DO;  Location: [facility];  Service: Plastics;  Laterality: N/A;   INCISION AND DRAINAGE OF WOUND Right 06/22/2019   Procedure: Excision of abdominal wound with closure;  Surgeon: Peggye Form, DO;  Location: [facility];  Service: Plastics;  Laterality: Right;  Total case time is 1 hour   LAPAROSCOPIC CHOLECYSTECTOMY  10-19-2002   dr Orson Slick  @[facility]    LAPAROSCOPIC GASTRIC BANDING  09-25-2003  @[facility]    WITH  HIATAL HERNIA REPAIR   LAPAROSCOPIC LYSIS OF ADHESIONS N/A 01/24/2019   Procedure: LAPAROSCOPY WITH LYSIS OF ADHESIONS;  Surgeon: Luretha Murphy, MD;  Location: [facility];  Service: General;  Laterality: N/A;   LAPAROSCOPIC REPAIR AND REMOVAL OF GASTRIC BAND  02-06-2006  @[facility]    LAPAROTOMY LYSIS ADHESIONS , UTERINE BIOPSY  01-31-2000   dr lowe  @[facility]    OPEN WOUND DEBRIDEMENT ANTERIOR ABDOMINAL WALL , REMOVAL FORGEIN BODY'S  05-10-2001   dr Orson Slick  @[facility]    PANNICULECTOMY N/A 01/24/2019   Procedure: PANNICULECTOMY;  Surgeon: Peggye Form, DO;  Location: [facility];  Service: Plastics;  Laterality: N/A;   REPAIR RECURRENT  HIATAL HERNIA   04-23-2004  @[facility]    TAKEDOWN RECURRENT HIATAL HERNIA FROM PREVIOUS MESH REPAIR OF DIAPHRAGM/ REPAIR HIATAL HERNIA AND THE GASTROPEXY  11/05/2004   TONSILLECTOMY  age 36   TOTAL ABDOMINAL HYSTERECTOMY W/ BILATERAL SALPINGOOPHORECTOMY  12-23-2005   dr soper   W/  BILATERAL URETEROLYSIS AND EXTENSIVE ABD. LYSIS ADHESIONS   UMBILICAL HERNIA REPAIR  12/27/2009   LAPAROSCOPY TAKEDOWN INCARCERATED UMBILICAL HERNIA WITH REPAIR/ OPEN REPAIR COMPLEX LOWER INCISIONAL HERNIA   VAGOTOMY  2006   Social History:  reports that she has been smoking cigarettes. She has a 20.00 pack-year smoking history. She has never used smokeless tobacco. She reports current alcohol use. She reports that she does not use drugs.  Allergies  Allergen Reactions   Adhesive [Tape]     Rash, blisters   Influenza Vaccines     States her "arm had a knot and turned red"   Lisinopril-Hydrochlorothiazide     welts    Family History  Problem Relation Age of Onset   Cancer Maternal Aunt        melanoma   Diabetes Father    Heart attack Father 52    Prior to Admission medications   Medication Sig Start Date End Date Taking? Authorizing Provider  acetaminophen (TYLENOL) 500 MG tablet Take 1,500 mg by mouth as needed for mild pain or moderate pain.   Yes [provider]  albuterol (VENTOLIN  HFA) 108 (90 Base) MCG/ACT inhaler Inhale 1-2 puffs into the lungs every 6 (six) hours as needed for wheezing or shortness of breath. 04/15/21  Yes Long, Arlyss Repress, MD  ALPRAZolam Prudy Feeler) 0.5 MG tablet Take 0.5 mg by mouth 3 (three) times daily as needed for anxiety.  05/06/18  Yes [provider]  atorvastatin (LIPITOR) 40 MG tablet Take 1 tablet (40 mg total) by mouth daily. 11/04/21  Yes Marinda Elk, MD  azelastine (ASTELIN) 0.1 % nasal spray Place into the nose. 11/13/21  Yes [provider]  azelastine (ASTELIN) 137 MCG/SPRAY nasal spray Place 2 sprays into both nostrils 2 (two) times daily as needed for rhinitis. Use in each  nostril as directed   Yes [provider]  Black Cohosh 540 MG CAPS Take by mouth.   Yes [provider]  buPROPion (WELLBUTRIN XL) 300 MG 24 hr tablet Take 300 mg by mouth daily.   Yes [provider]  celecoxib (CELEBREX) 200 MG capsule Take by mouth. 06/09/22  Yes [provider]  clopidogrel (PLAVIX) 75 MG tablet Take by mouth. 04/17/22  Yes [provider]  diltiazem (CARDIZEM CD) 120 MG 24 hr capsule Take 120 mg by mouth daily. 07/21/21  Yes [provider]  gabapentin (NEURONTIN) 300 MG capsule Take 300 mg by mouth 3 (three) times daily. 01/22/19  Yes [provider]  levocetirizine (XYZAL) 5 MG tablet Take 5 mg by mouth every evening. 09/04/22 10/04/22 Yes [provider]  magnesium oxide (MAG-OX) 400 (240 Mg) MG tablet Take 400 mg by mouth daily.   Yes [provider]  MOUNJARO 2.5 MG/0.5ML Pen Inject 2.5 mg into the skin once a week. 09/30/22  Yes [provider]  Multiple Vitamin (MULTI-VITAMIN) tablet Take 1 tablet by mouth daily.   Yes [provider]  nebivolol (BYSTOLIC) 5 MG tablet Take 5 mg by mouth daily.   Yes [provider]  pantoprazole (PROTONIX) 40 MG tablet Take 40 mg by mouth daily as needed (acid reflux).   Yes [provider]  promethazine (PHENERGAN) 25 MG tablet Take 12.5 mg by mouth as needed for nausea, vomiting or refractory nausea / vomiting. 09/28/20  Yes [provider]  QUEtiapine Fumarate 150 MG TABS Take 150 mg by mouth at bedtime. 09/04/22  Yes [provider]  SUMAtriptan (IMITREX) 20 MG/ACT nasal spray Place 1 spray (20 mg total) into the nose as needed for migraine or headache. May repeat in 2 hours if headache persists or recurs. 01/08/22  Yes Micki Riley, MD  topiramate (TOPAMAX) 50 MG tablet TAKE 1 TABLET BY MOUTH TWICE A DAY 05/13/22  Yes Micki Riley, MD  ustekinumab (STELARA) 90 MG/ML SOSY injection Inject 90 mg into the skin every 3 (three) months. 11/16/19  Yes [provider]  venlafaxine XR (EFFEXOR-XR) 150 MG 24 hr capsule Take 150 mg by mouth daily with breakfast.  05/04/18  Yes [provider]    Physical Exam: Vitals:   10/03/22 1815 10/03/22 1953 10/03/22 2050 10/03/22 2354  BP:  110/74  (!) 101/58  Pulse: (!) 107 99  95  Resp: 19 16  16   Temp: 98.2 F (36.8 C) 99.1 F (37.3 C)  97.8 F (36.6 C)  TempSrc:  Oral  Oral  SpO2: 93% 98%  96%  Weight:  (!) 157 kg (!) 157 kg   Height:   5\' 11"  (1.803 m)    Physical Exam:  General: No acute distress, well developed, well nourished HEENT: Normocephalic, atraumatic, PERRL Cardiovascular: Normal rhythm, tachycardia. Distal pulses intact. Pulmonary: Normal pulmonary effort, normal breath sounds Gastrointestinal: Nondistended abdomen, soft, tender in RLQ, normoactive bowel sounds, Musculoskeletal: no lower ext edema Lymphadenopathy: No cervical LAD. Skin: Skin is warm and dry. Neuro: No focal deficits noted, AAOx3. PSYCH: Attentive and cooperative  Data Reviewed:  Results for orders placed or performed during the hospital encounter of 10/03/22 (from the past 24 hour(s))  CBC with Differential     Status: Abnormal   Collection Time: 10/03/22  3:23 PM  Result Value Ref Range   WBC  19.6 (H) 4.0 - 10.5 K/uL   RBC 4.58 3.87 - 5.11 MIL/uL  Hemoglobin 13.6 12.0 - 15.0 g/dL   HCT 86.5 78.4 - 69.6 %   MCV 95.6 80.0 - 100.0 fL   MCH 29.7 26.0 - 34.0 pg   MCHC 31.1 30.0 - 36.0 g/dL   RDW 29.5 28.4 - 13.2 %   Platelets 222 150 - 400 K/uL   nRBC 0.0 0.0 - 0.2 %   Neutrophils Relative % 89 %   Neutro Abs 17.4 (H) 1.7 - 7.7 K/uL   Lymphocytes Relative 6 %   Lymphs Abs 1.2 0.7 - 4.0 K/uL   Monocytes Relative 4 %   Monocytes Absolute 0.7 0.1 - 1.0 K/uL   Eosinophils Relative 0 %   Eosinophils Absolute 0.0 0.0 - 0.5 K/uL   Basophils Relative 0 %   Basophils Absolute 0.1 0.0 - 0.1 K/uL   Immature Granulocytes 1 %   Abs Immature Granulocytes 0.12 (H) 0.00 - 0.07 K/uL  Comprehensive metabolic panel     Status: Abnormal   Collection Time: 10/03/22  3:23 PM  Result Value Ref Range   Sodium 138 135 - 145 mmol/L   Potassium 3.9 3.5 - 5.1 mmol/L   Chloride 107 98 - 111 mmol/L   CO2 23 22 - 32 mmol/L   Glucose, Bld 120 (H) 70 - 99 mg/dL   BUN 21 (H) 6 - 20 mg/dL   Creatinine, Ser 4.40 0.44 - 1.00 mg/dL   Calcium 8.9 8.9 - 10.2 mg/dL   Total Protein 7.3 6.5 - 8.1 g/dL   Albumin 3.5 3.5 - 5.0 g/dL   AST 18 15 - 41 U/L   ALT 19 0 - 44 U/L   Alkaline Phosphatase 71 38 - 126 U/L   Total Bilirubin 0.5 0.3 - 1.2 mg/dL   GFR, Estimated >72 >53 mL/min   Anion gap 8 5 - 15  Culture, blood (routine x 2)     Status: None (Preliminary result)   Collection Time: 10/03/22  4:39 PM   Specimen: BLOOD  Result Value Ref Range   Specimen Description      BLOOD SITE NOT SPECIFIED Performed at [facility], [address], [place], [place] [address]    Special Requests      BOTTLES DRAWN AEROBIC AND ANAEROBIC Blood Culture adequate volume Performed at [facility], [address] [place], [place] [address]    Culture PENDING    Report Status PENDING   Protime-INR     Status: None   Collection Time: 10/03/22  6:23 PM  Result Value Ref Range   Prothrombin  Time 14.8 11.4 - 15.2 seconds   INR 1.1 0.8 - 1.2  APTT     Status: None   Collection Time: 10/03/22  6:23 PM  Result Value Ref Range   aPTT 32 24 - 36 seconds  Urinalysis, Routine w reflex microscopic -Urine, Clean Catch     Status: None   Collection Time: 10/03/22  6:23 PM  Result Value Ref Range   Color, Urine YELLOW YELLOW   APPearance CLEAR CLEAR   Specific Gravity, Urine 1.026 1.005 - 1.030   pH 5.0 5.0 - 8.0   Glucose, UA NEGATIVE NEGATIVE mg/dL   Hgb urine dipstick NEGATIVE NEGATIVE   Bilirubin Urine NEGATIVE NEGATIVE   Ketones, ur NEGATIVE NEGATIVE mg/dL   Protein, ur NEGATIVE NEGATIVE mg/dL   Nitrite NEGATIVE NEGATIVE   Leukocytes,Ua NEGATIVE NEGATIVE  Lactic acid, plasma     Status: None   Collection Time: 10/03/22  9:57 PM  Result  Value Ref Range   Lactic Acid, Venous 1.6 0.5 - 1.9 mmol/L     Assessment and Plan: Acute hypoxic respiratory failure due to aspiration pneumonia / sepsis -  requiring 2 L O2 nasal cannula at present.   - Continue oxygen as needed - Zosyn q 8 - Resume routine medications      Advance Care Planning:   Code Status: Full Code  The patient names her son Gerilyn Pilgrim as a Runner, broadcasting/film/video and wants to be full code.  Consults: none  Family Communication: none  Severity of Illness: The appropriate patient status for this patient is INPATIENT. Inpatient status is judged to be reasonable and necessary in order to provide the required intensity of service to ensure the patient's safety. The patient's presenting symptoms, physical exam findings, and initial radiographic and laboratory data in the context of their chronic comorbidities is felt to place them at high risk for further clinical deterioration. Furthermore, it is not anticipated that the patient will be medically stable for discharge from the hospital within 2 midnights of admission.   * I certify that at the point of admission it is my clinical judgment that the patient will  require inpatient hospital care spanning beyond 2 midnights from the point of admission due to high intensity of service, high risk for further deterioration and high frequency of surveillance required.*  Author: Buena Irish, MD 10/04/2022 12:04 AM  For on call review [email or web address].

## 2022-10-05 DIAGNOSIS — J690 Pneumonitis due to inhalation of food and vomit: Secondary | ICD-10-CM | POA: Diagnosis not present

## 2022-10-05 LAB — CULTURE, BLOOD (ROUTINE X 2)

## 2022-10-05 LAB — CBC WITH DIFFERENTIAL/PLATELET
Abs Immature Granulocytes: 0.03 10*3/uL (ref 0.00–0.07)
Basophils Absolute: 0 10*3/uL (ref 0.0–0.1)
Basophils Relative: 0 %
Eosinophils Absolute: 0.4 10*3/uL (ref 0.0–0.5)
Eosinophils Relative: 4 %
HCT: 45.7 % (ref 36.0–46.0)
Hemoglobin: 13.7 g/dL (ref 12.0–15.0)
Immature Granulocytes: 0 %
Lymphocytes Relative: 27 %
Lymphs Abs: 2.6 10*3/uL (ref 0.7–4.0)
MCH: 29.3 pg (ref 26.0–34.0)
MCHC: 30 g/dL (ref 30.0–36.0)
MCV: 97.9 fL (ref 80.0–100.0)
Monocytes Absolute: 0.5 10*3/uL (ref 0.1–1.0)
Monocytes Relative: 5 %
Neutro Abs: 6.4 10*3/uL (ref 1.7–7.7)
Neutrophils Relative %: 64 %
Platelets: 238 10*3/uL (ref 150–400)
RBC: 4.67 MIL/uL (ref 3.87–5.11)
RDW: 14.4 % (ref 11.5–15.5)
WBC: 10 10*3/uL (ref 4.0–10.5)
nRBC: 0 % (ref 0.0–0.2)

## 2022-10-05 LAB — BASIC METABOLIC PANEL
Anion gap: 10 (ref 5–15)
BUN: 18 mg/dL (ref 6–20)
CO2: 25 mmol/L (ref 22–32)
Calcium: 8.9 mg/dL (ref 8.9–10.3)
Chloride: 106 mmol/L (ref 98–111)
Creatinine, Ser: 0.94 mg/dL (ref 0.44–1.00)
GFR, Estimated: >60 mL/min (ref >60)
Glucose, Bld: 123 mg/dL — ABNORMAL HIGH (ref 70–99)
Potassium: 3.6 mmol/L (ref 3.5–5.1)
Sodium: 141 mmol/L (ref 135–145)

## 2022-10-05 MED ORDER — AMOXICILLIN-POT CLAVULANATE 875-125 MG PO TABS: 1.0000 | ORAL_TABLET | Freq: Two times a day (BID) | ORAL | 0 refills | Status: AC

## 2022-10-05 MED ORDER — SACCHAROMYCES BOULARDII 250 MG PO CAPS: 250.0000 mg | ORAL_CAPSULE | Freq: Two times a day (BID) | ORAL | 0 refills | Status: AC

## 2022-10-05 NOTE — Discharge Summary (Signed)
Physician Discharge Summary  [name] ZOX:[id number] DOB: 30-May-1970 DOA: 10/03/2022  PCP: Rick Duff, PA-C  Admit date: 10/03/2022 Discharge date: 10/05/2022  Admitted From: Home Discharge disposition: Home  Recommendations at discharge:  Take antibiotics for 5 more days to complete the course, with probiotics Recommend use of CPAP mask at night for sleep apnea Follow-up with your regular doctors at [facility]   Brief narrative: [name] is a 52 y.o. female with PMH significant for morbid obesity, major depression, anxiety, migraine, history of suicidal attempt, hiatal hernia, GERD, Barrett's esophagus, psoriatic arthritis.   Patient states that she had a lap band procedure followed by trouble with wound dehiscence.  She states she continued to gain weight since then and also started vomiting in her sleep.  She does keep the head of her bed elevated to prevent it though.  She had a EGD done at [facility] few weeks ago, noted to have Barrett's esophagus and is currently on Protonix twice daily. She is currently being worked up at [facility] for possible gastric bypass and hernia repair combination surgery.  6/28, she woke up and noticed vomitus in her pajamas.  She was also short of breath and had a fever.  She went to see her PCP and was noted to be hypoxic and hence sent to the ED by EMS.  In the ED, patient had a temperature of 99.2, tachycardic to 100s, required 2 L oxygen by nasal cannula Labs showed WBC count elevated to 19.6, lactic acid level normal, renal function normal Chest x-ray showed left base pneumonia Started on IV antibiotics Admitted to [facility]  Subjective: Patient was seen and examined this morning.  Lying on bed.  Not in distress.  No new symptoms.  She is on 1 L oxygen this morning at rest.  Apparently, I did noticed her oxygen level dropped to 91% last night and started on oxygen.  Patient states he has history of mild sleep apnea but could not tolerate mask.   Ambulated on the hallway this morning without requiring oxygen  Hospital course: Aspiration pneumonia Per history, patient vomited in her sleep and probably aspirated.  Reports fever at home and hypoxia at MDs office Since arrival in the ED, patient has not had a documented fever or hypoxic event.  Does not meet criteria for sepsis Currently on IV Zosyn for aspiration pneumonia Significantly better.  WBC count slightly better today. Clear to auscultation bilaterally. Able to ambulate on the hallway without supplemental oxygen Discharged on oral Augmentin to continue for next 5 days with probiotics Recent Labs  Lab 10/03/22 1523 10/03/22 2157 10/04/22 0728 10/05/22 0804  WBC 19.6*  --  17.1* 10.0  LATICACIDVEN  --  1.6  --   --    Mild obstructive sleep apnea Reports history of mild sleep apnea diagnosed with sleep study 2 years ago.  Reports she tried different kinds of CPAP mask but could not tolerate and has not been using it.  O2 sat dropped down to 91% last night hence was started on low-flow oxygen.  Does not meet criteria for supplemental oxygen at home at discharge Recommend use of CPAP mask at home.  Hiatal hernia Barrett's esophagus Failed lap band procedure Per history, underwent EGD few weeks ago at [facility]. Currently on Protonix twice daily. She states she is currently being worked up at [facility] for possible gastric bypass and hernia repair combination surgery.  HTN PTA meds-Bystolic 5 mg daily, Cardizem 811 mg daily Continue the same.  HLD/TIA Continue Plavix and TIA.  Morbid Obesity  Body mass index is 48.27 kg/m. Patient has been advised to make an attempt to improve diet and exercise patterns to aid in weight loss. PTA meds-monitor 2.5 mg weekly  Major depression, anxiety, migraine history of suicidal attempt Currently denies suicidal ideation PTA meds-bupropion 300 mg daily, Seroquel 150 mg nightly, Effexor 150 mg daily Neurontin 300 mg 3 times daily, Topamax  50 mg twice daily, sumatriptan as needed, Xanax as needed Continue the same.  H/o psoriatic arthritis PTA meds-celecoxib 200 mg daily, Stelara 90 mg every 3 months   Mobility: Encourage ambulation  Goals of care   Code Status: Full Code   Wounds:  -    Discharge Exam:   Vitals:   10/04/22 1359 10/04/22 1757 10/05/22 0639 10/05/22 1146  BP: 124/77  111/71 124/64  Pulse: 89  89   Resp: 16  18   Temp: 98.2 F (36.8 C)  98 F (36.7 C)   TempSrc: Oral  Oral   SpO2: 94% 100% 91%   Weight:      Height:        Body mass index is 48.27 kg/m.  General exam: Pleasant, middle-aged Caucasian female. Skin: No rashes, lesions or ulcers. HEENT: Atraumatic, normocephalic, no obvious bleeding Lungs: Clear to auscultation bilaterally.  No wheezing or crackles. CVS: Regular rate and rhythm, no murmur GI/Abd soft, mild epigastric tenderness present, nondistended, bowel sound present CNS: Alert, awake, oriented x 3 Psychiatry: Mood appropriate Extremities: No pedal edema, no calf tenderness.  Follow ups:    Follow-up Information     Rick Duff, PA-C Follow up.   Specialty: Physician Assistant Contact information: [address] [place] [address] [phone]                 Discharge Instructions:   Discharge Instructions     Call MD for:  difficulty breathing, headache or visual disturbances   Complete by: As directed    Call MD for:  extreme fatigue   Complete by: As directed    Call MD for:  hives   Complete by: As directed    Call MD for:  persistant dizziness or light-headedness   Complete by: As directed    Call MD for:  persistant nausea and vomiting   Complete by: As directed    Call MD for:  severe uncontrolled pain   Complete by: As directed    Call MD for:  temperature >100.4   Complete by: As directed    Diet general   Complete by: As directed    Discharge instructions   Complete by: As directed    Recommendations at  discharge:   Take antibiotics for 5 more days to complete the course, with probiotics  Recommend use of CPAP mask at night for sleep apnea  Follow-up with your regular doctors at [facility] discharge instructions: Follow with Primary MD Rick Duff, PA-C in 7 days  Please request your PCP  to go over your hospital tests, procedures, radiology results at the follow up. Please get your medicines reviewed and adjusted.  Your PCP may decide to repeat certain labs or tests as needed. Do not drive, operate heavy machinery, perform activities at heights, swimming or participation in water activities or provide baby sitting services if your were admitted for syncope or siezures until you have seen by Primary MD or a Neurologist and advised to do so again. [employer] [place] Controlled Substance  Reporting System database was reviewed. Do not drive, operate heavy machinery, perform activities at heights, swim, participate in water activities or provide baby-sitting services while on medications for pain, sleep and mood until your outpatient physician has reevaluated you and advised to do so again.  You are strongly recommended to comply with the dose, frequency and duration of prescribed medications. Activity: As tolerated with Full fall precautions use walker/cane & assistance as needed Avoid using any recreational substances like cigarette, tobacco, alcohol, or non-prescribed drug. If you experience worsening of your admission symptoms, develop shortness of breath, life threatening emergency, suicidal or homicidal thoughts you must seek medical attention immediately by calling 911 or calling your MD immediately  if symptoms less severe. You must read complete instructions/literature along with all the possible adverse reactions/side effects for all the medicines you take and that have been prescribed to you. Take any new medicine only after you have completely understood and accepted all the possible  adverse reactions/side effects.  Wear Seat belts while driving. You were cared for by a hospitalist during your hospital stay. If you have any questions about your discharge medications or the care you received while you were in the hospital after you are discharged, you can call the unit and ask to speak with the hospitalist or the covering physician. Once you are discharged, your primary care physician will handle any further medical issues. Please note that NO REFILLS for any discharge medications will be authorized once you are discharged, as it is imperative that you return to your primary care physician (or establish a relationship with a primary care physician if you do not have one).   Increase activity slowly   Complete by: As directed        Discharge Medications:   Allergies as of 10/05/2022       Reactions   Adhesive [tape]    Rash, blisters   Influenza Vaccines    States her "arm had a knot and turned red"   Lisinopril-hydrochlorothiazide    welts        Medication List     TAKE these medications    acetaminophen 500 MG tablet Commonly known as: TYLENOL Take 1,500 mg by mouth as needed for mild pain or moderate pain.   albuterol 108 (90 Base) MCG/ACT inhaler Commonly known as: VENTOLIN HFA Inhale 1-2 puffs into the lungs every 6 (six) hours as needed for wheezing or shortness of breath.   ALPRAZolam 0.5 MG tablet Commonly known as: XANAX Take 0.5 mg by mouth 3 (three) times daily as needed for anxiety.   amoxicillin-clavulanate 875-125 MG tablet Commonly known as: AUGMENTIN Take 1 tablet by mouth 2 (two) times daily for 5 days.   atorvastatin 40 MG tablet Commonly known as: LIPITOR Take 1 tablet (40 mg total) by mouth daily.   azelastine 0.1 % nasal spray Commonly known as: ASTELIN Place 2 sprays into both nostrils 2 (two) times daily as needed for rhinitis. Use in each nostril as directed   azelastine 0.1 % nasal spray Commonly known as:  ASTELIN Place into the nose.   Black Cohosh 540 MG Caps Take by mouth.   buPROPion 300 MG 24 hr tablet Commonly known as: WELLBUTRIN XL Take 300 mg by mouth daily.   celecoxib 200 MG capsule Commonly known as: CELEBREX Take by mouth.   clopidogrel 75 MG tablet Commonly known as: PLAVIX Take by mouth.   diltiazem 120 MG 24 hr capsule Commonly known as: CARDIZEM CD Take 120  mg by mouth daily.   gabapentin 300 MG capsule Commonly known as: NEURONTIN Take 300 mg by mouth 3 (three) times daily.   levocetirizine 5 MG tablet Commonly known as: XYZAL Take 5 mg by mouth every evening.   magnesium oxide 400 (240 Mg) MG tablet Commonly known as: MAG-OX Take 400 mg by mouth daily.   Mounjaro 2.5 MG/0.5ML Pen Generic drug: tirzepatide Inject 2.5 mg into the skin once a week.   Multi-Vitamin tablet Take 1 tablet by mouth daily.   nebivolol 5 MG tablet Commonly known as: BYSTOLIC Take 5 mg by mouth daily.   pantoprazole 40 MG tablet Commonly known as: PROTONIX Take 40 mg by mouth daily as needed (acid reflux).   promethazine 25 MG tablet Commonly known as: PHENERGAN Take 12.5 mg by mouth as needed for nausea, vomiting or refractory nausea / vomiting.   QUEtiapine Fumarate 150 MG Tabs Take 150 mg by mouth at bedtime.   saccharomyces boulardii 250 MG capsule Commonly known as: FLORASTOR Take 1 capsule (250 mg total) by mouth 2 (two) times daily for 5 days.   Stelara 90 MG/ML Sosy injection Generic drug: ustekinumab Inject 90 mg into the skin every 3 (three) months.   SUMAtriptan 20 MG/ACT nasal spray Commonly known as: IMITREX Place 1 spray (20 mg total) into the nose as needed for migraine or headache. May repeat in 2 hours if headache persists or recurs.   topiramate 50 MG tablet Commonly known as: TOPAMAX TAKE 1 TABLET BY MOUTH TWICE A DAY   venlafaxine XR 150 MG 24 hr capsule Commonly known as: EFFEXOR-XR Take 150 mg by mouth daily with breakfast.          The results of significant diagnostics from this hospitalization (including imaging, microbiology, ancillary and laboratory) are listed below for reference.    Procedures and Diagnostic Studies:   DG Chest 2 View  Result Date: 10/03/2022 CLINICAL DATA:  Shortness of breath. EXAM: CHEST - 2 VIEW COMPARISON:  04/15/2021 FINDINGS: Lungs are adequately inflated with airspace opacification over the left base likely a pneumonia. No definite effusion. Right lung is clear. Mild cardiomegaly. Remainder of the exam is unchanged. IMPRESSION: 1. Airspace opacification over the left base likely a pneumonia. 2. Mild cardiomegaly. Electronically Signed   By: Elberta Fortis M.D.   On: 10/03/2022 15:12     Labs:   Basic Metabolic Panel: Recent Labs  Lab 10/03/22 1523 10/04/22 0728 10/05/22 0804  NA 138 137 141  K 3.9 3.7 3.6  CL 107 106 106  CO2 23 25 25   GLUCOSE 120* 109* 123*  BUN 21* 16 18  CREATININE 0.90 0.81 0.94  CALCIUM 8.9 8.3* 8.9  MG  --  2.1  --    GFR Estimated Creatinine Clearance: 115.1 mL/min (by C-G formula based on SCr of 0.94 mg/dL). Liver Function Tests: Recent Labs  Lab 10/03/22 1523  AST 18  ALT 19  ALKPHOS 71  BILITOT 0.5  PROT 7.3  ALBUMIN 3.5   No results for input(s): "LIPASE", "AMYLASE" in the last 168 hours. No results for input(s): "AMMONIA" in the last 168 hours. Coagulation profile Recent Labs  Lab 10/03/22 1823  INR 1.1    CBC: Recent Labs  Lab 10/03/22 1523 10/04/22 0728 10/05/22 0804  WBC 19.6* 17.1* 10.0  NEUTROABS 17.4*  --  6.4  HGB 13.6 12.5 13.7  HCT 43.8 40.6 45.7  MCV 95.6 96.0 97.9  PLT 222 200 238   Cardiac Enzymes: No results for  input(s): "CKTOTAL", "CKMB", "CKMBINDEX", "TROPONINI" in the last 168 hours. BNP: Invalid input(s): "POCBNP" CBG: No results for input(s): "GLUCAP" in the last 168 hours. D-Dimer No results for input(s): "DDIMER" in the last 72 hours. Hgb A1c No results for input(s): "HGBA1C" in the  last 72 hours. Lipid Profile No results for input(s): "CHOL", "HDL", "LDLCALC", "TRIG", "CHOLHDL", "LDLDIRECT" in the last 72 hours. Thyroid function studies No results for input(s): "TSH", "T4TOTAL", "T3FREE", "THYROIDAB" in the last 72 hours.  Invalid input(s): "FREET3" Anemia work up No results for input(s): "VITAMINB12", "FOLATE", "FERRITIN", "TIBC", "IRON", "RETICCTPCT" in the last 72 hours. Microbiology Recent Results (from the past 240 hour(s))  Culture, blood (routine x 2)     Status: None (Preliminary result)   Collection Time: 10/03/22  4:39 PM   Specimen: BLOOD  Result Value Ref Range Status   Specimen Description   Final    BLOOD SITE NOT SPECIFIED Performed at [facility], [address], [place], [place] [address]    Special Requests   Final    BOTTLES DRAWN AEROBIC AND ANAEROBIC Blood Culture adequate volume Performed at [facility], [address], [place], [place] [address]    Culture   Final    NO GROWTH 2 DAYS Performed at [facility], [address] [place], [place] [address]    Report Status PENDING  Incomplete  Culture, blood (routine x 2)     Status: None (Preliminary result)   Collection Time: 10/03/22  9:57 PM   Specimen: BLOOD LEFT ARM  Result Value Ref Range Status   Specimen Description BLOOD LEFT ARM  Final   Special Requests   Final    BOTTLES DRAWN AEROBIC ONLY Blood Culture results may not be optimal due to an excessive volume of blood received in culture bottles   Culture   Final    NO GROWTH 1 DAY Performed at [facility], [address] [place], [place] [address]    Report Status PENDING  Incomplete    Time coordinating discharge: 45 minutes  Signed: Proctor Carriker  [facility] 10/05/2022, 3:42 PM

## 2022-10-05 NOTE — Progress Notes (Signed)
Discharge instructions read by patient, states she understands them. Shewanna removed her own IV without gauze while I was out of her room. 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 Explained she shouldn't take it out herself and we use Gauze to prevent infection. Staley reminded she has 2 prescriptions to pick up at her pharmacy. Patient was discharged via wheelchair down to the waiting room to wait for her ride.

## 2022-10-06 LAB — CULTURE, BLOOD (ROUTINE X 2)
Culture: NO GROWTH
Culture: NO GROWTH

## 2022-10-08 LAB — CULTURE, BLOOD (ROUTINE X 2)

## 2022-11-02 ENCOUNTER — Other Ambulatory Visit: Payer: Self-pay | Admitting: Neurology

## 2022-12-25 ENCOUNTER — Ambulatory Visit: Payer: Medicare HMO | Admitting: Neurology

## 2023-01-05 NOTE — Progress Notes (Deleted)
[facility] [address] [place]. [place] [address] [phone]       OFFICE FOLLOW-UP NOTE  [name] Date of Birth:  05-20-1970 Medical Record Number:  [id number]    Primary neurologist: Dr. Pearlean Brownie Reason for visit: Atypical migraine  No chief complaint on file.    HPI:   Update 01/06/2023 JM: Patient returns for follow-up visit.  Doing well since prior visit.  Reports headaches well-controlled on topiramate 50 mg twice daily, use of Imitrex ***. Denies any reoccurring episodes of TIA, stroke or complicated migraines.         History provided for reference purposes only Update 06/26/2022 Dr. Pearlean Brownie: She returns for follow-up after last visit with me 5 months ago.  She states she is doing quite well.  She has had no further episodes of TIA, stroke or complicated migraine episodes.  Continues to have a few severe headaches once a month or so which respond to Imitrex.  He has a couple of minor headaches a month as well.  She remains on Topamax 50 mg twice daily which she is tolerating well without significant side effects.  She does participate in activities like meditation and goes to church for relaxation.  Her cardiologist changed her from aspirin to Plavix for cardiac and stroke prevention recently.  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  She has no new complaints.    Initial visit 01/08/2022 Dr. Pearlean Brownie: [name] is a 52 year old Caucasian lady seen today for initial office follow-up visit following hospital consultation in July 2023.CHRYSTYNA Walter is an 52 y.o. female with a PMHx of anxiety, arthritis, asthma, major depressive disorder, history of suicide attempt in 2018, HTN and psoriasis who presented to the [facility] she is tired.  On 11/03/21 afternoon with a c/c of headache that had started the day prior at about noon. The headache was accompanied  by balance issues and dizziness. She also had felt at that time that her left eye felt droopy. She had some nausea but no emesis. Headache was holocephalic but worse on the left side in the parietal occipital region, throbbing "like a jackhammer" and rated as 8/10 at its worst, with associated photophobia but without osmophobia or sonophobia. She was afebrile with BP of 125/85 and normal O2 sats. Exam by EDP revealed left upper extremity dysmetria, raising concern for CVA. CTA of head and neck revealed moderate tortuosity of the cervical internal carotid arteries bilaterally without significant stenosis and was otherwise unremarkable.  MRI scan of the brain showed no acute abnormality.  LDL cholesterol was 78 mg percent.  Hemoglobin A1c of 6.0.  Echocardiogram showed ejection fraction of 60 to 55% without cardiac source embolism.  He was treated with headache cocktail which resolved the headache.  He was diagnosed with complicated migraine episode.  She was prescribed Imitrex nasal spray.  She states she is done well since discharge.  Continues to have headaches with the Norvasc.  At the previous but the Imitrex nasal.  Works pretty well and release the headache.  She describes headache as occurring at  frequency of once a week.  Headaches are moderate 8/10 in severity beginning in the back of her shoulders and back of the head and is throbbing in nature and  last 4 hours .  There is light and sound sensitivity.  She also describes tightness in the muscles of the  neck.  Headaches are triggered by stress, increase physical activity t and tiredness.  She has never been on headache prophylactic medications.  She denies any recurrent stroke or TIA symptoms.  She is tolerating treatment well without bleeding or bruising.  She is also tolerating Lipitor well without muscle aches and pains..     ROS:   14 system review of systems is positive for headache, neck pain, light and sound sensitivity decreased hearing,  tinnitus, slurred speech, left-sided ataxia and all other systems negative PMH:  Past Medical History:  Diagnosis Date   Anxiety    Arthritis    joints   Asthma    Endometriosis    Fat necrosis of breast    right   GERD (gastroesophageal reflux disease)    History of hiatal hernia    s/p repair few times   History of suicide attempt 03/2017   Hypertension    MDD (major depressive disorder)    Panniculitis    Pneumonia 10/2018   in hospital with pneumonia   PONV (postoperative nausea and vomiting)    Psoriasis    treated with Humeria    Social History:  Social History   Socioeconomic History   Marital status: Widowed    Spouse name: Not on file   Number of children: Not on file   Years of education: Not on file   Highest education level: Not on file  Occupational History   Not on file  Tobacco Use   Smoking status: Every Day    Current packs/day: 0.00    Average packs/day: 1 pack/day for 20.0 years (20.0 ttl pk-yrs)    Types: Cigarettes    Start date: 05/20/1995    Last attempt to quit: 05/20/2015    Years since quitting: 7.6   Smokeless tobacco: Never   Tobacco comments:    < 1 pack  Vaping Use   Vaping status: Never Used  Substance and Sexual Activity   Alcohol use: Yes    Comment: socially   Drug use: Never   Sexual activity: Yes    Birth control/protection: Surgical  Other Topics Concern   Not on file  Social History Narrative   Mother lives with her.       Social Determinants of Health   Financial Resource Strain: Low Risk  (09/10/2022)   Received from [facility], [facility]   Overall Financial Resource Strain (CARDIA)    Difficulty of Paying Living Expenses: Not very hard  Food Insecurity: No Food Insecurity (10/03/2022)   Hunger Vital Sign    Worried About Running Out of Food in the Last Year: Never true    Ran Out of Food in the Last Year: Never true  Transportation Needs: No Transportation Needs (10/03/2022)    PRAPARE - Administrator, Civil Service (Medical): No    Lack of Transportation (Non-Medical): No  Physical Activity: Insufficiently Active (09/10/2022)   Received from [facility], [facility]   Exercise Vital Sign    Days of Exercise per Week: 2 days    Minutes of Exercise per Session: 30 min  Stress: Stress Concern Present (09/10/2022)   Received from [facility], [facility]   [employer] of Occupational Health - Occupational Stress Questionnaire    Feeling of Stress : To some extent  Social Connections: Moderately Integrated (09/10/2022)   Received from [facility], [facility]   Social  Connection and Isolation Panel [NHANES]    Frequency of Communication with Friends and Family: More than three times a week    Frequency of Social Gatherings with Friends and Family: Not on file    Attends Religious Services: More than 4 times per year    Active Member of [employer] or Organizations: Yes    Attends Banker Meetings: More than 4 times per year    Marital Status: Widowed  Intimate Partner Violence: Not At Risk (10/03/2022)   Humiliation, Afraid, Rape, and Kick questionnaire    Fear of Current or Ex-Partner: No    Emotionally Abused: No    Physically Abused: No    Sexually Abused: No    Medications:   Current Outpatient Medications on File Prior to Visit  Medication Sig Dispense Refill   acetaminophen (TYLENOL) 500 MG tablet Take 1,500 mg by mouth as needed for mild pain or moderate pain.     albuterol (VENTOLIN HFA) 108 (90 Base) MCG/ACT inhaler Inhale 1-2 puffs into the lungs every 6 (six) hours as needed for wheezing or shortness of breath. 6.7 g 0   ALPRAZolam (XANAX) 0.5 MG tablet Take 0.5 mg by mouth 3 (three) times daily as needed for anxiety.      atorvastatin (LIPITOR) 40 MG tablet Take 1 tablet (40 mg total) by mouth daily. 30 tablet 3    azelastine (ASTELIN) 0.1 % nasal spray Place into the nose.     azelastine (ASTELIN) 137 MCG/SPRAY nasal spray Place 2 sprays into both nostrils 2 (two) times daily as needed for rhinitis. Use in each nostril as directed     Black Cohosh 540 MG CAPS Take by mouth.     buPROPion (WELLBUTRIN XL) 300 MG 24 hr tablet Take 300 mg by mouth daily.     celecoxib (CELEBREX) 200 MG capsule Take by mouth.     clopidogrel (PLAVIX) 75 MG tablet Take by mouth.     diltiazem (CARDIZEM CD) 120 MG 24 hr capsule Take 120 mg by mouth daily.     gabapentin (NEURONTIN) 300 MG capsule Take 300 mg by mouth 3 (three) times daily.     levocetirizine (XYZAL) 5 MG tablet Take 5 mg by mouth every evening.     magnesium oxide (MAG-OX) 400 (240 Mg) MG tablet Take 400 mg by mouth daily.     MOUNJARO 2.5 MG/0.5ML Pen Inject 2.5 mg into the skin once a week.     Multiple Vitamin (MULTI-VITAMIN) tablet Take 1 tablet by mouth daily.     nebivolol (BYSTOLIC) 5 MG tablet Take 5 mg by mouth daily.     pantoprazole (PROTONIX) 40 MG tablet Take 40 mg by mouth daily as needed (acid reflux).     promethazine (PHENERGAN) 25 MG tablet Take 12.5 mg by mouth as needed for nausea, vomiting or refractory nausea / vomiting.     QUEtiapine Fumarate 150 MG TABS Take 150 mg by mouth at bedtime.     SUMAtriptan (IMITREX) 20 MG/ACT nasal spray Place 1 spray (20 mg total) into the nose as needed for migraine or headache. May repeat in 2 hours if headache persists or recurs. 1 each 2   topiramate (TOPAMAX) 50 MG tablet TAKE 1 TABLET BY MOUTH TWICE A DAY 180 tablet 1   ustekinumab (STELARA) 90 MG/ML SOSY injection Inject 90 mg into the skin every 3 (three) months.     venlafaxine XR (EFFEXOR-XR) 150 MG 24 hr capsule Take 150 mg by mouth daily with breakfast.  No current facility-administered medications on file prior to visit.    Allergies:   Allergies  Allergen Reactions   Adhesive [Tape]     Rash, blisters   Influenza Vaccines      States her "arm had a knot and turned red"   Lisinopril-Hydrochlorothiazide     welts    Physical Exam There were no vitals filed for this visit. There is no height or weight on file to calculate BMI.   General: Well-developed middle-age Caucasian female, seated, in no evident distress Head: head normocephalic and atraumatic.  Neck: supple with no carotid or supraclavicular bruits Cardiovascular: regular rate and rhythm, no murmurs Musculoskeletal: no deformity.  Mild tightness of posterior neck muscles but no restriction of movement. Skin:  no rash/petichiae Vascular:  Normal pulses all extremities  Neurologic Exam Mental Status: Awake and fully alert. Oriented to place and time. Recent and remote memory intact. Attention span, concentration and fund of knowledge appropriate. Mood and affect appropriate.  Cranial Nerves:  Pupils equal, briskly reactive to light. Extraocular movements full without nystagmus. Visual fields full to confrontation. Hearing intact. Facial sensation intact. Face, tongue, palate moves normally and symmetrically.  Motor: Normal bulk and tone. Normal strength in all tested extremity muscles. Sensory.: intact to touch ,pinprick .position and vibratory sensation.  Coordination: Rapid alternating movements normal in all extremities. Finger-to-nose and heel-to-shin performed accurately bilaterally. Gait and Station: Arises from chair without difficulty. Stance is normal. Gait demonstrates normal stride length and balance .  Not able to heel, toe and tandem walk with slight difficulty.  Reflexes: 1+ and symmetric. Toes downgoing.        ASSESSMENT: 52 year old Caucasian lady with mixed migraine and tension headache with episode of complicated migraine in July 2023.  Vascular risk factors of obesity and mild hyperlipidemia     PLAN:  -Continue topiramate 50 mg twice daily for headache prevention -Continue use of sumatriptan as needed for rescue       I  spent *** minutes of face-to-face and non-face-to-face time with patient.  This included previsit chart review, lab review, study review, order entry, electronic health record documentation, patient education and discussion regarding above diagnoses and treatment plan and answered all other questions to patient's satisfaction  Ihor Austin, [facility]  [facility] [address] [place], [place] [address]  Phone [phone] Fax [phone] Note: This document was prepared with digital dictation and possible smart phrase technology. Any transcriptional errors that result from this process are unintentional.

## 2023-01-06 ENCOUNTER — Ambulatory Visit: Payer: Medicare HMO | Admitting: Adult Health

## 2023-01-06 ENCOUNTER — Encounter: Payer: Self-pay | Admitting: Adult Health

## 2023-02-24 ENCOUNTER — Telehealth: Payer: Self-pay | Admitting: Neurology

## 2023-02-24 ENCOUNTER — Other Ambulatory Visit: Payer: Self-pay | Admitting: Neurology

## 2023-02-24 MED ORDER — TOPIRAMATE 50 MG PO TABS: 50.0000 mg | ORAL_TABLET | Freq: Two times a day (BID) | ORAL | 0 refills | Status: DC

## 2023-02-24 NOTE — Telephone Encounter (Signed)
Pt has rescheduled her missed appointment and is on wait list.  Pt is asking if she can get refills on her topiramate (TOPAMAX) 50 MG tablet until her appointment

## 2023-03-04 ENCOUNTER — Encounter (HOSPITAL_BASED_OUTPATIENT_CLINIC_OR_DEPARTMENT_OTHER): Payer: Self-pay | Admitting: Emergency Medicine

## 2023-03-04 ENCOUNTER — Emergency Department (HOSPITAL_BASED_OUTPATIENT_CLINIC_OR_DEPARTMENT_OTHER): Payer: Medicare HMO | Admitting: Radiology

## 2023-03-04 ENCOUNTER — Observation Stay (HOSPITAL_BASED_OUTPATIENT_CLINIC_OR_DEPARTMENT_OTHER)
Admission: EM | Admit: 2023-03-04 | Discharge: 2023-03-07 | Disposition: A | Discharge status: Home / Self Care | Payer: Medicare HMO | Attending: Internal Medicine | Admitting: Internal Medicine

## 2023-03-04 ENCOUNTER — Other Ambulatory Visit: Payer: Self-pay

## 2023-03-04 DIAGNOSIS — F17210 Nicotine dependence, cigarettes, uncomplicated: Secondary | ICD-10-CM | POA: Insufficient documentation

## 2023-03-04 DIAGNOSIS — K219 Gastro-esophageal reflux disease without esophagitis: Secondary | ICD-10-CM | POA: Diagnosis not present

## 2023-03-04 DIAGNOSIS — Z79899 Other long term (current) drug therapy: Secondary | ICD-10-CM | POA: Insufficient documentation

## 2023-03-04 DIAGNOSIS — J9601 Acute respiratory failure with hypoxia: Secondary | ICD-10-CM

## 2023-03-04 DIAGNOSIS — F419 Anxiety disorder, unspecified: Secondary | ICD-10-CM | POA: Insufficient documentation

## 2023-03-04 DIAGNOSIS — M6281 Muscle weakness (generalized): Secondary | ICD-10-CM | POA: Insufficient documentation

## 2023-03-04 DIAGNOSIS — F32A Depression, unspecified: Secondary | ICD-10-CM | POA: Insufficient documentation

## 2023-03-04 DIAGNOSIS — Z8673 Personal history of transient ischemic attack (TIA), and cerebral infarction without residual deficits: Secondary | ICD-10-CM | POA: Diagnosis not present

## 2023-03-04 DIAGNOSIS — J690 Pneumonitis due to inhalation of food and vomit: Principal | ICD-10-CM | POA: Diagnosis present

## 2023-03-04 DIAGNOSIS — Q401 Congenital hiatus hernia: Secondary | ICD-10-CM | POA: Insufficient documentation

## 2023-03-04 DIAGNOSIS — R0602 Shortness of breath: Secondary | ICD-10-CM | POA: Diagnosis present

## 2023-03-04 DIAGNOSIS — Z6841 Body Mass Index (BMI) 40.0 and over, adult: Secondary | ICD-10-CM | POA: Insufficient documentation

## 2023-03-04 DIAGNOSIS — I10 Essential (primary) hypertension: Secondary | ICD-10-CM | POA: Diagnosis not present

## 2023-03-04 DIAGNOSIS — Z7902 Long term (current) use of antithrombotics/antiplatelets: Secondary | ICD-10-CM | POA: Insufficient documentation

## 2023-03-04 DIAGNOSIS — Z1152 Encounter for screening for COVID-19: Secondary | ICD-10-CM | POA: Insufficient documentation

## 2023-03-04 DIAGNOSIS — E66813 Obesity, class 3: Secondary | ICD-10-CM | POA: Insufficient documentation

## 2023-03-04 DIAGNOSIS — E785 Hyperlipidemia, unspecified: Secondary | ICD-10-CM | POA: Diagnosis not present

## 2023-03-04 LAB — CBC WITH DIFFERENTIAL/PLATELET
Abs Immature Granulocytes: 0.07 10*3/uL (ref 0.00–0.07)
Basophils Absolute: 0 10*3/uL (ref 0.0–0.1)
Basophils Relative: 0 %
Eosinophils Absolute: 0.1 10*3/uL (ref 0.0–0.5)
Eosinophils Relative: 0 %
HCT: 44.1 % (ref 36.0–46.0)
Hemoglobin: 14.2 g/dL (ref 12.0–15.0)
Immature Granulocytes: 0 %
Lymphocytes Relative: 11 %
Lymphs Abs: 2 10*3/uL (ref 0.7–4.0)
MCH: 29.3 pg (ref 26.0–34.0)
MCHC: 32.2 g/dL (ref 30.0–36.0)
MCV: 90.9 fL (ref 80.0–100.0)
Monocytes Absolute: 0.7 10*3/uL (ref 0.1–1.0)
Monocytes Relative: 4 %
Neutro Abs: 15.5 10*3/uL — ABNORMAL HIGH (ref 1.7–7.7)
Neutrophils Relative %: 85 %
Platelets: 240 10*3/uL (ref 150–400)
RBC: 4.85 MIL/uL (ref 3.87–5.11)
RDW: 15.6 % — ABNORMAL HIGH (ref 11.5–15.5)
WBC: 18.3 10*3/uL — ABNORMAL HIGH (ref 4.0–10.5)
nRBC: 0 % (ref 0.0–0.2)

## 2023-03-04 LAB — COMPREHENSIVE METABOLIC PANEL
ALT: 22 U/L (ref 0–44)
AST: 27 U/L (ref 15–41)
Albumin: 4.1 g/dL (ref 3.5–5.0)
Alkaline Phosphatase: 83 U/L (ref 38–126)
Anion gap: 11 (ref 5–15)
BUN: 17 mg/dL (ref 6–20)
CO2: 24 mmol/L (ref 22–32)
Calcium: 9.5 mg/dL (ref 8.9–10.3)
Chloride: 104 mmol/L (ref 98–111)
Creatinine, Ser: 0.88 mg/dL (ref 0.44–1.00)
GFR, Estimated: >60 mL/min (ref >60)
Glucose, Bld: 123 mg/dL — ABNORMAL HIGH (ref 70–99)
Potassium: 4 mmol/L (ref 3.5–5.1)
Sodium: 139 mmol/L (ref 135–145)
Total Bilirubin: 0.7 mg/dL (ref <1.2)
Total Protein: 7.7 g/dL (ref 6.5–8.1)

## 2023-03-04 LAB — LIPASE, BLOOD: Lipase: 19 U/L (ref 11–51)

## 2023-03-04 LAB — RESP PANEL BY RT-PCR (RSV, FLU A&B, COVID)  RVPGX2
Influenza A by PCR: NEGATIVE
Influenza B by PCR: NEGATIVE
Resp Syncytial Virus by PCR: NEGATIVE
SARS Coronavirus 2 by RT PCR: NEGATIVE

## 2023-03-04 LAB — EXPECTORATED SPUTUM ASSESSMENT W GRAM STAIN, RFLX TO RESP C

## 2023-03-04 LAB — LACTIC ACID, PLASMA: Lactic Acid, Venous: 1.1 mmol/L (ref 0.5–1.9)

## 2023-03-04 MED ORDER — TOPIRAMATE 25 MG PO TABS
50.0000 mg | ORAL_TABLET | Freq: Two times a day (BID) | ORAL | Status: DC
  Administered 2023-03-04 – 2023-03-07 (×6): 50 mg via ORAL
  Filled 2023-03-04 (×6): qty 2

## 2023-03-04 MED ORDER — ENOXAPARIN SODIUM 80 MG/0.8ML IJ SOSY
80.0000 mg | PREFILLED_SYRINGE | INTRAMUSCULAR | Status: DC
  Administered 2023-03-04 – 2023-03-06 (×3): 80 mg via SUBCUTANEOUS
  Filled 2023-03-04 (×3): qty 0.8

## 2023-03-04 MED ORDER — SUMATRIPTAN 20 MG/ACT NA SOLN: 1.0000 | NASAL | Status: DC | PRN

## 2023-03-04 MED ORDER — IPRATROPIUM-ALBUTEROL 0.5-2.5 (3) MG/3ML IN SOLN
3.0000 mL | Freq: Once | RESPIRATORY_TRACT | Status: AC
  Administered 2023-03-04: 3 mL via RESPIRATORY_TRACT
  Filled 2023-03-04: qty 3

## 2023-03-04 MED ORDER — GABAPENTIN 300 MG PO CAPS
300.0000 mg | ORAL_CAPSULE | Freq: Three times a day (TID) | ORAL | Status: DC
  Administered 2023-03-04 – 2023-03-07 (×8): 300 mg via ORAL
  Filled 2023-03-04 (×8): qty 1

## 2023-03-04 MED ORDER — DM-GUAIFENESIN ER 30-600 MG PO TB12
1.0000 | ORAL_TABLET | Freq: Two times a day (BID) | ORAL | Status: DC
  Administered 2023-03-04 – 2023-03-07 (×6): 1 via ORAL
  Filled 2023-03-04 (×6): qty 1

## 2023-03-04 MED ORDER — LACTATED RINGERS IV BOLUS
1000.0000 mL | Freq: Once | INTRAVENOUS | Status: AC
  Administered 2023-03-04: 1000 mL via INTRAVENOUS

## 2023-03-04 MED ORDER — DILTIAZEM HCL ER COATED BEADS 120 MG PO CP24
120.0000 mg | ORAL_CAPSULE | Freq: Every day | ORAL | Status: DC
  Administered 2023-03-05 – 2023-03-07 (×3): 120 mg via ORAL
  Filled 2023-03-04 (×3): qty 1

## 2023-03-04 MED ORDER — CELECOXIB 200 MG PO CAPS
200.0000 mg | ORAL_CAPSULE | Freq: Every day | ORAL | Status: DC
  Administered 2023-03-05 – 2023-03-07 (×3): 200 mg via ORAL
  Filled 2023-03-04 (×3): qty 1

## 2023-03-04 MED ORDER — ATORVASTATIN CALCIUM 40 MG PO TABS
40.0000 mg | ORAL_TABLET | Freq: Every day | ORAL | Status: DC
  Administered 2023-03-05 – 2023-03-07 (×3): 40 mg via ORAL
  Filled 2023-03-04 (×3): qty 1

## 2023-03-04 MED ORDER — ADULT MULTIVITAMIN W/MINERALS CH
1.0000 | ORAL_TABLET | Freq: Every day | ORAL | Status: DC
  Administered 2023-03-05 – 2023-03-07 (×3): 1 via ORAL
  Filled 2023-03-04 (×3): qty 1

## 2023-03-04 MED ORDER — ACETAMINOPHEN 650 MG RE SUPP: 650.0000 mg | Freq: Four times a day (QID) | RECTAL | Status: DC | PRN

## 2023-03-04 MED ORDER — FAMOTIDINE 20 MG PO TABS
40.0000 mg | ORAL_TABLET | Freq: Two times a day (BID) | ORAL | Status: DC
  Administered 2023-03-04 – 2023-03-07 (×6): 40 mg via ORAL
  Filled 2023-03-04 (×6): qty 2

## 2023-03-04 MED ORDER — ALBUTEROL SULFATE HFA 108 (90 BASE) MCG/ACT IN AERS
2.0000 | INHALATION_SPRAY | RESPIRATORY_TRACT | Status: DC | PRN
  Administered 2023-03-04: 2 via RESPIRATORY_TRACT
  Filled 2023-03-04: qty 6.7

## 2023-03-04 MED ORDER — ACETAMINOPHEN 325 MG PO TABS
650.0000 mg | ORAL_TABLET | Freq: Four times a day (QID) | ORAL | Status: DC | PRN
  Administered 2023-03-06 – 2023-03-07 (×2): 650 mg via ORAL
  Filled 2023-03-04 (×2): qty 2

## 2023-03-04 MED ORDER — SODIUM CHLORIDE 0.9 % IV SOLN
3.0000 g | Freq: Once | INTRAVENOUS | Status: AC
  Administered 2023-03-04: 3 g via INTRAVENOUS

## 2023-03-04 MED ORDER — BUPROPION HCL ER (XL) 300 MG PO TB24
300.0000 mg | ORAL_TABLET | Freq: Every day | ORAL | Status: DC
  Administered 2023-03-05 – 2023-03-07 (×3): 300 mg via ORAL
  Filled 2023-03-04 (×3): qty 1

## 2023-03-04 MED ORDER — PANTOPRAZOLE SODIUM 40 MG PO TBEC
40.0000 mg | DELAYED_RELEASE_TABLET | Freq: Every day | ORAL | Status: DC | PRN
Start: 2023-03-04 — End: 2023-03-07

## 2023-03-04 MED ORDER — IPRATROPIUM-ALBUTEROL 0.5-2.5 (3) MG/3ML IN SOLN: 3.0000 mL | Freq: Four times a day (QID) | RESPIRATORY_TRACT | Status: DC | PRN

## 2023-03-04 MED ORDER — NEBIVOLOL HCL 10 MG PO TABS
5.0000 mg | ORAL_TABLET | Freq: Every day | ORAL | Status: DC
  Administered 2023-03-04 – 2023-03-07 (×4): 5 mg via ORAL
  Filled 2023-03-04 (×4): qty 1

## 2023-03-04 MED ORDER — QUETIAPINE FUMARATE 50 MG PO TABS
150.0000 mg | ORAL_TABLET | Freq: Every day | ORAL | Status: DC
  Administered 2023-03-04 – 2023-03-06 (×3): 150 mg via ORAL
  Filled 2023-03-04: qty 6
  Filled 2023-03-04 (×3): qty 3
  Filled 2023-03-04: qty 6

## 2023-03-04 MED ORDER — ALPRAZOLAM 0.5 MG PO TABS: 1.0000 mg | ORAL_TABLET | Freq: Two times a day (BID) | ORAL | Status: DC | PRN

## 2023-03-04 MED ORDER — MAGNESIUM OXIDE -MG SUPPLEMENT 400 (240 MG) MG PO TABS
400.0000 mg | ORAL_TABLET | Freq: Every day | ORAL | Status: DC
  Administered 2023-03-05 – 2023-03-07 (×3): 400 mg via ORAL
  Filled 2023-03-04 (×3): qty 1

## 2023-03-04 MED ORDER — ONDANSETRON HCL 4 MG PO TABS: 4.0000 mg | ORAL_TABLET | Freq: Four times a day (QID) | ORAL | Status: DC | PRN

## 2023-03-04 MED ORDER — VENLAFAXINE HCL ER 150 MG PO CP24
150.0000 mg | ORAL_CAPSULE | Freq: Every day | ORAL | Status: DC
  Administered 2023-03-05 – 2023-03-07 (×3): 150 mg via ORAL
  Filled 2023-03-04 (×3): qty 1

## 2023-03-04 MED ORDER — ONDANSETRON HCL 4 MG/2ML IJ SOLN
4.0000 mg | Freq: Four times a day (QID) | INTRAMUSCULAR | Status: DC | PRN
  Administered 2023-03-06 – 2023-03-07 (×2): 4 mg via INTRAVENOUS
  Filled 2023-03-04 (×2): qty 2

## 2023-03-04 MED ORDER — SODIUM CHLORIDE 0.9 % IV SOLN
3.0000 g | Freq: Four times a day (QID) | INTRAVENOUS | Status: DC
  Administered 2023-03-04 – 2023-03-07 (×11): 3 g via INTRAVENOUS
  Filled 2023-03-04 (×12): qty 8

## 2023-03-04 MED ORDER — CLOPIDOGREL BISULFATE 75 MG PO TABS
75.0000 mg | ORAL_TABLET | Freq: Every day | ORAL | Status: DC
  Administered 2023-03-05 – 2023-03-07 (×3): 75 mg via ORAL
  Filled 2023-03-04 (×3): qty 1

## 2023-03-04 MED ORDER — SENNOSIDES-DOCUSATE SODIUM 8.6-50 MG PO TABS: 1.0000 | ORAL_TABLET | Freq: Every evening | ORAL | Status: DC | PRN

## 2023-03-04 NOTE — H&P (Signed)
History and Physical    Patient: [name] DOB: July 15, 1970 DOA: 03/04/2023 DOS: the patient was seen and examined on 03/04/2023 PCP: Rick Duff, PA-C  Patient coming from: [facility]  Chief Complaint:  Chief Complaint  Patient presents with   Shortness of Breath   HPI: [name] is a 52 y.o. female with medical history significant of anxiety, GERD, HTN, HLD, TIA, polyneuropathy, asthma, morbid obesity, hiatal hernia s/p repair with recurrent hiatal hernia, arthritis, guttate psoriasis, Barrett's esophagus, and aspiration pneumonia who presented to the [facility] ED for evaluation of shortness of breath and cough.  Patient reports that she was admitted to the hospital for aspiration pneumonia about 5 months ago.  For the last 2 to 3 days, she has not felt well and has had some muscle aches and occasional chills.  Last night, she started having labored breathing with chills. Reports using her nebulized machine multiple times with improvement in her symptoms. She reports a productive cough with dark brown sputum and endorses mild shortness of breath, headaches and muscle aches but denies any fevers, nausea, vomiting, abdominal pain, chest pain or palpitation.   [facility] ED course: Tachycardic with HR 90s-100, SpO2 92% on room air, placed on 2 L [place].  Afebrile with normal RR. WBC 18.3, Hgb 14.2, platelet 240, creatinine 0.88, lipase 19, Actiq acid 1.1, negative flu and COVID test. CXR showed near complete resolution of the LLL infiltrate but new mild right basilar atelectasis versus early infiltrate. EKG shows sinus tach Received 1 L IV LR bolus, DuoNeb x 1 and IV Unasyn x 1. Admitted to [facility] service and transferred to [facility]  Review of Systems: As mentioned in the history of present illness. All other systems reviewed and are negative. Past Medical History:  Diagnosis Date   Anxiety    Arthritis    joints   Asthma    Endometriosis    Fat necrosis  of breast    right   GERD (gastroesophageal reflux disease)    History of hiatal hernia    s/p repair few times   History of suicide attempt 03/2017   Hypertension    MDD (major depressive disorder)    Panniculitis    Pneumonia 10/2018   in hospital with pneumonia   PONV (postoperative nausea and vomiting)    Psoriasis    treated with Humeria   Past Surgical History:  Procedure Laterality Date   ACHILLES TENDON REPAIR Left 01-11-2015  @[facility]    BREAST CYST EXCISION Right 01/24/2019   Procedure: EXCISIN OF RIGHT BREAST FAT NECROSIS;  Surgeon: Peggye Form, DO;  Location: [facility];  Service: Plastics;  Laterality: Right;   BREAST REDUCTION SURGERY Bilateral 01/09/2016   Procedure: MAMMARY REDUCTION  (BREAST) BILATERAL;  Surgeon: Peggye Form, DO;  Location: [facility];  Service: Plastics;  Laterality: Bilateral;   CARPOMETACARPAL (CMC) FUSION OF THUMB Right 05-04-2015   @[facility]    COMBINED HYSTEROSCOPY DIAGNOSTIC / D&C  06-23-2003     dr Ambrose Mantle @[facility]    D & C HYSTERSCOPY /  DX LAPAROSCOPY CONVERSION LAPAROTOMY LYSIS ADHESIONS WITH RIGHT SALPINECTOMY  12-26-2003    dr Edward Jolly  @[facility]    DEBRIDEMENT AND CLOSURE WOUND N/A 02/07/2019   Procedure: DEBRIDEMENT OF ABDOMINAL WOUND, PLACEMENT OF A-CELL, PLACEMENT OF WOUND VAC;  Surgeon: Peggye Form, DO;  Location: [facility];  Service: Plastics;  Laterality: N/A;   INCISION AND DRAINAGE OF WOUND Right 06/22/2019   Procedure: Excision of abdominal wound with closure;  Surgeon: Peggye Form, DO;  Location: [facility];  Service: Plastics;  Laterality: Right;  Total case time is 1 hour   LAPAROSCOPIC CHOLECYSTECTOMY  10-19-2002   dr Orson Slick  @[facility]    LAPAROSCOPIC GASTRIC BANDING  09-25-2003  @[facility]    WITH HIATAL HERNIA REPAIR   LAPAROSCOPIC LYSIS OF ADHESIONS N/A 01/24/2019   Procedure: LAPAROSCOPY WITH LYSIS OF ADHESIONS;  Surgeon: Luretha Murphy, MD;  Location: [facility];  Service: General;  Laterality: N/A;    LAPAROSCOPIC REPAIR AND REMOVAL OF GASTRIC BAND  02-06-2006  @[facility]    LAPAROTOMY LYSIS ADHESIONS , UTERINE BIOPSY  01-31-2000   dr lowe  @[facility]    OPEN WOUND DEBRIDEMENT ANTERIOR ABDOMINAL WALL , REMOVAL FORGEIN BODY'S  05-10-2001   dr Orson Slick  @[facility]    PANNICULECTOMY N/A 01/24/2019   Procedure: PANNICULECTOMY;  Surgeon: Peggye Form, DO;  Location: [facility];  Service: Plastics;  Laterality: N/A;   REPAIR RECURRENT  HIATAL HERNIA   04-23-2004  @[facility]    TAKEDOWN RECURRENT HIATAL HERNIA FROM PREVIOUS MESH REPAIR OF DIAPHRAGM/ REPAIR HIATAL HERNIA AND THE GASTROPEXY  11/05/2004   TONSILLECTOMY  age 53   TOTAL ABDOMINAL HYSTERECTOMY W/ BILATERAL SALPINGOOPHORECTOMY  12-23-2005   dr soper   W/  BILATERAL URETEROLYSIS AND EXTENSIVE ABD. LYSIS ADHESIONS   UMBILICAL HERNIA REPAIR  12/27/2009   LAPAROSCOPY TAKEDOWN INCARCERATED UMBILICAL HERNIA WITH REPAIR/ OPEN REPAIR COMPLEX LOWER INCISIONAL HERNIA   VAGOTOMY  2006   Social History:  reports that she has been smoking cigarettes. She started smoking about 27 years ago. She has a 20 pack-year smoking history. She has never used smokeless tobacco. She reports current alcohol use. She reports that she does not use drugs.  Allergies  Allergen Reactions   Adhesive [Tape]     Rash, blisters   Influenza Vaccines     States her "arm had a knot and turned red"   Lisinopril-Hydrochlorothiazide     welts    Family History  Problem Relation Age of Onset   Cancer Maternal Aunt        melanoma   Diabetes Father    Heart attack Father 45    Prior to Admission medications   Medication Sig Start Date End Date Taking? Authorizing Provider  acetaminophen (TYLENOL) 500 MG tablet Take 1,500 mg by mouth as needed for mild pain or moderate pain.    [provider]  albuterol (VENTOLIN HFA) 108 (90 Base) MCG/ACT inhaler Inhale 1-2 puffs into the lungs every 6 (six) hours as needed for wheezing or shortness of breath. 04/15/21   Long, Arlyss Repress, MD  ALPRAZolam  Prudy Feeler) 0.5 MG tablet Take 0.5 mg by mouth 3 (three) times daily as needed for anxiety.  05/06/18   [provider]  atorvastatin (LIPITOR) 40 MG tablet Take 1 tablet (40 mg total) by mouth daily. 11/04/21   Marinda Elk, MD  azelastine (ASTELIN) 0.1 % nasal spray Place into the nose. 11/13/21   [provider]  azelastine (ASTELIN) 137 MCG/SPRAY nasal spray Place 2 sprays into both nostrils 2 (two) times daily as needed for rhinitis. Use in each nostril as directed    [provider]  Black Cohosh 540 MG CAPS Take by mouth.    [provider]  buPROPion (WELLBUTRIN XL) 300 MG 24 hr tablet Take 300 mg by mouth daily.    [provider]  celecoxib (CELEBREX) 200 MG capsule Take by mouth. 06/09/22   [provider]  clopidogrel (PLAVIX) 75 MG tablet  Take by mouth. 04/17/22   [provider]  diltiazem (CARDIZEM CD) 120 MG 24 hr capsule Take 120 mg by mouth daily. 07/21/21   [provider]  famotidine (PEPCID) 40 MG tablet Take 40 mg by mouth 2 (two) times daily.    [provider]  fluticasone (FLONASE) 50 MCG/ACT nasal spray Place 2 sprays into both nostrils daily.    [provider]  gabapentin (NEURONTIN) 300 MG capsule Take 300 mg by mouth 3 (three) times daily. 01/22/19   [provider]  levocetirizine (XYZAL) 5 MG tablet Take 5 mg by mouth every evening. 09/04/22 10/04/22  [provider]  magnesium oxide (MAG-OX) 400 (240 Mg) MG tablet Take 400 mg by mouth daily.    [provider]  MOUNJARO 2.5 MG/0.5ML Pen Inject 2.5 mg into the skin once a week. 09/30/22   [provider]  Multiple Vitamin (MULTI-VITAMIN) tablet Take 1 tablet by mouth daily.    [provider]  nebivolol (BYSTOLIC) 5 MG tablet Take 5 mg by mouth daily.    [provider]  pantoprazole (PROTONIX) 40 MG tablet Take 40 mg by mouth daily as needed (acid reflux).    [provider]  promethazine (PHENERGAN) 25 MG tablet Take 12.5 mg by mouth as needed for nausea, vomiting or refractory nausea / vomiting. 09/28/20   [provider]  QUEtiapine Fumarate 150 MG TABS Take 150 mg by mouth at bedtime. 09/04/22   [provider]  SUMAtriptan (IMITREX) 20 MG/ACT nasal spray Place 1 spray (20 mg total) into the nose as needed for migraine or headache. May repeat in 2 hours if headache persists or recurs. 01/08/22   Micki Riley, MD  topiramate (TOPAMAX) 50 MG tablet Take 1 tablet (50 mg total) by mouth 2 (two) times daily. 02/24/23   Micki Riley, MD  ustekinumab Marcy Panning) 90 MG/ML SOSY injection Inject 90 mg into the skin every 3 (three) months. 11/16/19   [provider]  venlafaxine XR (EFFEXOR-XR) 150 MG 24 hr capsule Take 150 mg by mouth daily with breakfast.  05/04/18   [provider]    Physical Exam: Vitals:   03/04/23 1600 03/04/23 1630 03/04/23 1749 03/04/23 1758  BP: (!) 106/59 91/68 123/72   Pulse: 82 88 89   Resp: 12 13    Temp:   98 F (36.7 C)   TempSrc:      SpO2: 100% 93% 97%   Weight:    (!) 157.9 kg  Height:    5\' 11"  (1.803 m)   General: Pleasant, well-appearing morbidly obese woman laying in bed. No acute distress. HEENT: [place]/AT. Anicteric sclera.  CV: RRR. No murmurs, rubs, or gallops. No LE edema Pulmonary: On 2 L [place]. Lungs CTAB. Normal effort. No wheezing or rales.  Decreased breath sounds throughout. Abdominal: Soft, nontender, nondistended. Normal bowel sounds. Extremities: Palpable radial and DP pulses. Normal ROM. Skin: Warm and dry. No obvious rash or lesions. Neuro: A&Ox3. Moves all extremities. Normal sensation to light touch. No focal deficit. Psych: Normal mood and affect  Data Reviewed:  Leukocytosis with left shift (WBC 18.3), Hgb 14.2, platelet 240, creatinine 0.88, lipase 19, Actiq acid 1.1, negative flu and COVID test. CXR showed near complete resolution of the LLL infiltrate but new mild  right basilar atelectasis versus early infiltrate. EKG shows sinus tach Blood cultures pending  Assessment and Plan: [name] is a 52 y.o. female with medical history significant of anxiety, GERD,  HTN, HLD, polyneuropathy, asthma, morbid obesity, hiatal hernia s/p repair with recurrent hiatal hernia, arthritis, guttate psoriasis, Barrett's esophagus, and aspiration pneumonia who presented for evaluation of shortness of breath and cough and admitted for aspiration pneumonia.   # Aspiration pneumonia Morbidly obese patient with recurrent hiatal hernia here for evaluation for URI symptoms and shortness of breath. Found to have leukocytosis, new oxygen requirement and possible infiltrate in the right base. She remains afebrile and satting well on 2 L [place]. -Continue IV Unasyn -Mucinex D twice daily -Follow-up sputum culture, blood culture -Aspiration precautions, elevate head of bed while eating -Incentive spirometer -Trend CBC, fever curve -Ambulate with pulse ox tomorrow morning  # Asthma No wheezing on exam. -Continue supplemental O2 -As needed DuoNebs  # Hiatal hernia # GERD -Protonix 40 mg daily -Famotidine 40 mg twice daily  # HTN Normotensive -Resume diltiazem and Bystolic  # HLD # TIA -Resume atorvastatin and Plavix  # Anxiety -Alprazolam 1 mg twice a day as needed for anxiety  # Depression -Venlafaxine 150 mg daily -Bupropion 300 mg daily  # Hx migraine headache -Topiramate 50 mg twice daily -As needed sumatriptan  # Polyneuropathy -Gabapentin 300 mg 3 times daily  # Arthritis -Celebrex 200 mg daily  # Class III obesity Body mass index is 48.54 kg/m. Filed Weights   03/04/23 1758  Weight: (!) 157.9 kg  -Continue mounjaro in the outpatient -Continue nutrition counseling with PCP    Advance Care Planning:   Code Status: Full Code   Consults: None  Family Communication: No family at bedside  Severity of Illness: The appropriate patient  status for this patient is OBSERVATION. Observation status is judged to be reasonable and necessary in order to provide the required intensity of service to ensure the patient's safety. The patient's presenting symptoms, physical exam findings, and initial radiographic and laboratory data in the context of their medical condition is felt to place them at decreased risk for further clinical deterioration. Furthermore, it is anticipated that the patient will be medically stable for discharge from the hospital within 2 midnights of admission.   Author: Steffanie Rainwater, MD 03/04/2023 6:54 PM  For on call review [email or web address].

## 2023-03-04 NOTE — ED Provider Notes (Signed)
  Physical Exam  BP 102/71   Pulse 88   Temp 97.9 F (36.6 C) (Oral)   Resp 15   SpO2 97%   Physical Exam Vitals and nursing note reviewed.  HENT:     Head: Normocephalic and atraumatic.  Eyes:     Pupils: Pupils are equal, round, and reactive to light.  Cardiovascular:     Rate and Rhythm: Normal rate and regular rhythm.  Pulmonary:     Effort: Pulmonary effort is normal.     Breath sounds: Normal breath sounds.  Abdominal:     Palpations: Abdomen is soft.     Tenderness: There is no abdominal tenderness.  Skin:    General: Skin is warm and dry.  Neurological:     Mental Status: She is alert.  Psychiatric:        Mood and Affect: Mood normal.     Procedures  Procedures  ED Course / MDM   Clinical Course as of 03/04/23 1631  Wed Mar 04, 2023  1217 Pulse Rate: 100 [JL]  1217 SpO2: 92 % [JL]  1333 WBC(!): 18.3 [JL]  1600 Discussed with admitting hospitalist who accepts patient for admission.  Patient has remained hemodynamically stable up to this point. [MP]    Clinical Course User Index [JL] Ernie Avena, MD [MP] Royanne Foots, DO   Medical Decision Making I, Estelle June DO, have assumed care of this patient from the previous provider  Amount and/or Complexity of Data Reviewed Labs: ordered. Decision-making details documented in ED Course. Radiology: ordered.  Risk Prescription drug management. Decision regarding hospitalization.   Final diagnosis Acute hypoxemic respiratory failure Aspiration pneumonia       Royanne Foots, DO 03/04/23 1631

## 2023-03-04 NOTE — Plan of Care (Signed)

## 2023-03-04 NOTE — Progress Notes (Signed)
PHARMACY NOTE -  Unasyn  Pharmacy has been assisting with dosing of Unasyn for aspiration PNA. Dosage remains stable at 3g IV q6 hr and further renal adjustments per institutional Pharmacy antibiotic protocol  Pharmacy will sign off, following peripherally for culture results, dose adjustments, and length of therapy. Please reconsult if a change in clinical status warrants re-evaluation of dosage.  Bernadene Person, PharmD, BCPS [phone] 03/04/2023, 7:33 PM

## 2023-03-04 NOTE — ED Provider Notes (Signed)
[place] EMERGENCY DEPARTMENT AT [facility] Provider Note   CSN: [id number] Arrival date & time: 03/04/23  1152     History  Chief Complaint  Patient presents with   Shortness of Breath    [name] is a 52 y.o. female.   Shortness of Breath Associated symptoms: cough      52 year old female with medical history significant for hypertension, asthma, anxiety, GERD, hiatal hernia with episodes of aspiration and aspiration pneumonia presenting to the emergency department with a feeling of shortness of breath.  The patient states that she has been waking up with emesis on her abdomen.  She has a history of aspiration pneumonia requiring admission in June and she feels that symptoms are similar.  She has been vomiting more than normal and woken up actively vomiting.  She complains of  a productive cough, chills, body aches as well as a headache.  No known fevers.  No sick contacts.  No abdominal pain or chest pain.  Home Medications Prior to Admission medications   Medication Sig Start Date End Date Taking? Authorizing Provider  acetaminophen (TYLENOL) 500 MG tablet Take 1,500 mg by mouth as needed for mild pain or moderate pain.    [provider]  albuterol (VENTOLIN HFA) 108 (90 Base) MCG/ACT inhaler Inhale 1-2 puffs into the lungs every 6 (six) hours as needed for wheezing or shortness of breath. 04/15/21   Long, Arlyss Repress, MD  ALPRAZolam Prudy Feeler) 0.5 MG tablet Take 0.5 mg by mouth 3 (three) times daily as needed for anxiety.  05/06/18   [provider]  atorvastatin (LIPITOR) 40 MG tablet Take 1 tablet (40 mg total) by mouth daily. 11/04/21   Marinda Elk, MD  azelastine (ASTELIN) 0.1 % nasal spray Place into the nose. 11/13/21   [provider]  azelastine (ASTELIN) 137 MCG/SPRAY nasal spray Place 2 sprays into both nostrils 2 (two) times daily as needed for rhinitis. Use in each nostril as directed    [provider]  Black  Cohosh 540 MG CAPS Take by mouth.    [provider]  buPROPion (WELLBUTRIN XL) 300 MG 24 hr tablet Take 300 mg by mouth daily.    [provider]  celecoxib (CELEBREX) 200 MG capsule Take by mouth. 06/09/22   [provider]  clopidogrel (PLAVIX) 75 MG tablet Take by mouth. 04/17/22   [provider]  diltiazem (CARDIZEM CD) 120 MG 24 hr capsule Take 120 mg by mouth daily. 07/21/21   [provider]  famotidine (PEPCID) 40 MG tablet Take 40 mg by mouth 2 (two) times daily.    [provider]  fluticasone (FLONASE) 50 MCG/ACT nasal spray Place 2 sprays into both nostrils daily.    [provider]  gabapentin (NEURONTIN) 300 MG capsule Take 300 mg by mouth 3 (three) times daily. 01/22/19   [provider]  levocetirizine (XYZAL) 5 MG tablet Take 5 mg by mouth every evening. 09/04/22 10/04/22  [provider]  magnesium oxide (MAG-OX) 400 (240 Mg) MG tablet Take 400 mg by mouth daily.    [provider]  mirtazapine (REMERON) 15 MG tablet Take 15 mg by mouth at bedtime.    [provider]  MOUNJARO 2.5 MG/0.5ML Pen Inject 2.5 mg into the skin once a week. 09/30/22   [provider]  Multiple Vitamin (MULTI-VITAMIN) tablet Take 1 tablet by mouth daily.    [provider]  nebivolol (BYSTOLIC) 5 MG tablet Take 5  mg by mouth daily.    [provider]  pantoprazole (PROTONIX) 40 MG tablet Take 40 mg by mouth daily as needed (acid reflux).    [provider]  promethazine (PHENERGAN) 25 MG tablet Take 12.5 mg by mouth as needed for nausea, vomiting or refractory nausea / vomiting. 09/28/20   [provider]  QUEtiapine Fumarate 150 MG TABS Take 150 mg by mouth at bedtime. 09/04/22   [provider]  SUMAtriptan (IMITREX) 20 MG/ACT nasal spray Place 1 spray (20 mg total) into the nose as needed for migraine or headache. May repeat in 2 hours if headache persists or  recurs. 01/08/22   Micki Riley, MD  topiramate (TOPAMAX) 50 MG tablet Take 1 tablet (50 mg total) by mouth 2 (two) times daily. 02/24/23   Micki Riley, MD  ustekinumab Marcy Panning) 90 MG/ML SOSY injection Inject 90 mg into the skin every 3 (three) months. 11/16/19   [provider]  venlafaxine XR (EFFEXOR-XR) 150 MG 24 hr capsule Take 150 mg by mouth daily with breakfast.  05/04/18   [provider]      Allergies    Adhesive [tape], Influenza vaccines, and Lisinopril-hydrochlorothiazide    Review of Systems   Review of Systems  Constitutional:  Positive for chills.  Respiratory:  Positive for cough and shortness of breath.   All other systems reviewed and are negative.   Physical Exam Updated Vital Signs BP 102/71   Pulse 88   Temp 97.9 F (36.6 C) (Oral)   Resp 15   SpO2 97%  Physical Exam Vitals and nursing note reviewed.  Constitutional:      General: She is not in acute distress.    Appearance: She is well-developed. She is obese.  HENT:     Head: Normocephalic and atraumatic.  Eyes:     Conjunctiva/sclera: Conjunctivae normal.  Cardiovascular:     Rate and Rhythm: Normal rate and regular rhythm.     Heart sounds: No murmur heard. Pulmonary:     Effort: Pulmonary effort is normal. No respiratory distress.     Breath sounds: Normal breath sounds.  Abdominal:     Palpations: Abdomen is soft.     Tenderness: There is no abdominal tenderness.  Musculoskeletal:        General: No swelling.     Cervical back: Neck supple.  Skin:    General: Skin is warm and dry.     Capillary Refill: Capillary refill takes less than 2 seconds.  Neurological:     Mental Status: She is alert.  Psychiatric:        Mood and Affect: Mood normal.     ED Results / Procedures / Treatments   Labs (all labs ordered are listed, but only abnormal results are displayed) Labs Reviewed  CBC WITH DIFFERENTIAL/PLATELET - Abnormal; Notable for the following components:       Result Value   WBC 18.3 (*)    RDW 15.6 (*)    Neutro Abs 15.5 (*)    All other components within normal limits  COMPREHENSIVE METABOLIC PANEL - Abnormal; Notable for the following components:   Glucose, Bld 123 (*)    All other components within normal limits  RESP PANEL BY RT-PCR (RSV, FLU A&B, COVID)  [id number]  CULTURE, BLOOD (ROUTINE X 2)  CULTURE, BLOOD (ROUTINE X 2)  LIPASE, BLOOD  LACTIC ACID, PLASMA  LACTIC ACID, PLASMA    EKG EKG Interpretation Date/Time:  Wednesday March 04 2023 12:00:20  EST Ventricular Rate:  100 PR Interval:  163 QRS Duration:  82 QT Interval:  327 QTC Calculation: 422 R Axis:   68  Text Interpretation: Sinus tachycardia Low voltage, precordial leads Confirmed by Ernie Avena ([id number]) on 03/04/2023 12:06:37 PM  Radiology DG Chest 2 View  Result Date: 03/04/2023 CLINICAL DATA:  Worsening shortness of breath for 2 days. Left-sided chest pain and productive cough.Vomiting. EXAM: CHEST - 2 VIEW COMPARISON:  10/03/2022 FINDINGS: The heart size and mediastinal contours are within normal limits. Near-complete resolution of left lower lobe infiltrate since prior study. Mild bandlike opacity in the right lung base is new and may be due to mild atelectasis or early infiltrate. No pleural effusion. IMPRESSION: Near-complete resolution of left lower lobe infiltrate since prior study. New mild right basilar atelectasis versus early infiltrate. Electronically Signed   By: Danae Orleans M.D.   On: 03/04/2023 14:38    Procedures Procedures    Medications Ordered in ED Medications  albuterol (VENTOLIN HFA) 108 (90 Base) MCG/ACT inhaler 2 puff (2 puffs Inhalation Given 03/04/23 1237)  ipratropium-albuterol (DUONEB) 0.5-2.5 (3) MG/3ML nebulizer solution 3 mL (3 mLs Nebulization Given 03/04/23 1236)  Ampicillin-Sulbactam (UNASYN) 3 g in sodium chloride 0.9 % 100 mL IVPB (0 g Intravenous Stopped 03/04/23 1446)  lactated ringers bolus 1,000 mL (1,000 mLs Intravenous  New Bag/Given 03/04/23 1355)    ED Course/ Medical Decision Making/ A&P Clinical Course as of 03/04/23 1505  Wed Mar 04, 2023  1217 Pulse Rate: 100 [JL]  1217 SpO2: 92 % [JL]  1333 WBC(!): 18.3 [JL]    Clinical Course User Index [JL] Ernie Avena, MD                                 Medical Decision Making Amount and/or Complexity of Data Reviewed Labs: ordered. Decision-making details documented in ED Course. Radiology: ordered.  Risk Prescription drug management. Decision regarding hospitalization.    52 year old female with medical history significant for hypertension, asthma, anxiety, GERD, hiatal hernia with episodes of aspiration and aspiration pneumonia presenting to the emergency department with a feeling of shortness of breath.  The patient states that she has been waking up with emesis on her abdomen.  She has a history of aspiration pneumonia requiring admission in June and she feels that symptoms are similar.  She has been vomiting more than normal and woken up actively vomiting.  She complains of  a productive cough, chills, body aches as well as a headache.  No known fevers.  No sick contacts.  No abdominal pain or chest pain.  On arrival, the patient was afebrile, tachycardic heart rate 100, not tachypneic RR 20, BP 122/71, saturating 92% on room air.  Patient was subsequently placed on oxygen 2 L O2 via nasal cannula.  Laboratory evaluation revealed a leukocytosis to 18.3 with a neutrophilia and left shift, CMP unremarkable, lactic acid normal, lipase normal, COVID influenza and RSV PCR testing negative.  Given patient's history of aspiration and aspiration pneumonia, acute hypoxic respiratory failure, IV access was obtained and the patient was administered IV fluid bolus in addition to IV Unasyn.  A chest x-ray was performed: IMPRESSION:  Near-complete resolution of left lower lobe infiltrate since prior  study.    New mild right basilar atelectasis versus early  infiltrate.   Patient has new mild right basilar atelectasis versus early infiltrate, has a leukocytosis, has a productive cough and has new hypoxic  respiratory failure, recommended admission for observation in the setting of this, hospitalist medicine consulted for admission.   Final Clinical Impression(s) / ED Diagnoses Final diagnoses:  Aspiration pneumonia, unspecified aspiration pneumonia type, unspecified laterality, unspecified part of lung (HCC)  Acute respiratory failure with hypoxia [facility])    Rx / DC Orders ED Discharge Orders     None         Ernie Avena, MD 03/04/23 1505

## 2023-03-04 NOTE — ED Notes (Signed)
ED Provider at bedside. 

## 2023-03-04 NOTE — ED Triage Notes (Signed)
Pt c/o increased SOB x2 days which worsened last night. Pt reports PMH aspiration pneumonia d/t hiatal hernia. Pt states she has been vomiting more than normal and has woken up with active vomiting. Pt also c/o L sided CP,  productive cough, chills, bodyaches, and headache.

## 2023-03-04 NOTE — ED Notes (Signed)
Report given to [facility] w/ [facility]. ETA 15-20 mins.

## 2023-03-04 NOTE — ED Notes (Addendum)
Pt's reports feeling SOB and O2 sat at 92% while at rest. Placed on 2L via [place] per MD.

## 2023-03-05 DIAGNOSIS — I10 Essential (primary) hypertension: Secondary | ICD-10-CM | POA: Diagnosis not present

## 2023-03-05 DIAGNOSIS — J690 Pneumonitis due to inhalation of food and vomit: Secondary | ICD-10-CM | POA: Diagnosis not present

## 2023-03-05 DIAGNOSIS — J9601 Acute respiratory failure with hypoxia: Secondary | ICD-10-CM | POA: Diagnosis not present

## 2023-03-05 LAB — BASIC METABOLIC PANEL
Anion gap: 8 (ref 5–15)
BUN: 17 mg/dL (ref 6–20)
CO2: 24 mmol/L (ref 22–32)
Calcium: 8.2 mg/dL — ABNORMAL LOW (ref 8.9–10.3)
Chloride: 105 mmol/L (ref 98–111)
Creatinine, Ser: 0.83 mg/dL (ref 0.44–1.00)
GFR, Estimated: >60 mL/min (ref >60)
Glucose, Bld: 100 mg/dL — ABNORMAL HIGH (ref 70–99)
Potassium: 3.4 mmol/L — ABNORMAL LOW (ref 3.5–5.1)
Sodium: 137 mmol/L (ref 135–145)

## 2023-03-05 LAB — EXPECTORATED SPUTUM ASSESSMENT W GRAM STAIN, RFLX TO RESP C

## 2023-03-05 LAB — CBC
HCT: 39.6 % (ref 36.0–46.0)
Hemoglobin: 12.6 g/dL (ref 12.0–15.0)
MCH: 29.9 pg (ref 26.0–34.0)
MCHC: 31.8 g/dL (ref 30.0–36.0)
MCV: 93.8 fL (ref 80.0–100.0)
Platelets: 203 10*3/uL (ref 150–400)
RBC: 4.22 MIL/uL (ref 3.87–5.11)
RDW: 15.7 % — ABNORMAL HIGH (ref 11.5–15.5)
WBC: 12.5 10*3/uL — ABNORMAL HIGH (ref 4.0–10.5)
nRBC: 0 % (ref 0.0–0.2)

## 2023-03-05 LAB — MAGNESIUM: Magnesium: 2.1 mg/dL (ref 1.7–2.4)

## 2023-03-05 MED ORDER — ORAL CARE MOUTH RINSE: 15.0000 mL | OROMUCOSAL | Status: DC | PRN

## 2023-03-05 MED ORDER — POTASSIUM CHLORIDE CRYS ER 20 MEQ PO TBCR
40.0000 meq | EXTENDED_RELEASE_TABLET | Freq: Two times a day (BID) | ORAL | Status: AC
  Administered 2023-03-05 (×2): 40 meq via ORAL
  Filled 2023-03-05 (×2): qty 2

## 2023-03-05 NOTE — Plan of Care (Signed)

## 2023-03-05 NOTE — Progress Notes (Signed)
   03/05/23 1111  TOC Brief Assessment  Insurance and Status Reviewed  Patient has primary care physician Yes  Home environment has been reviewed Single family home  Prior level of function: Independent  Prior/Current Home Services No current home services  Social Determinants of Health Reivew SDOH reviewed no interventions necessary  Readmission risk has been reviewed Yes  Transition of care needs transition of care needs identified, TOC will continue to follow

## 2023-03-05 NOTE — Progress Notes (Signed)
[facility]                                                                               [name], is a 52 y.o. female, DOB - [birth year], ZHY:[id number] Admit date - 03/04/2023    Outpatient Primary MD for the patient is Rick Duff, PA-C  LOS - 0  days    Brief summary     52 y.o. female with medical history significant of anxiety, GERD, HTN, HLD, TIA, polyneuropathy, asthma, morbid obesity, hiatal hernia s/p repair with recurrent hiatal hernia, arthritis, guttate psoriasis, Barrett's esophagus, and aspiration pneumonia who [facility] for evaluation of shortness of breath and cough.  She was admitted for aspiration pneumonia.    CXR showed near complete resolution of the LLL infiltrate but new mild right basilar atelectasis versus early infiltrate. Admitted to [facility] service and transferred to [facility] & Plan    Assessment and Plan:   Aspiration pneumonia Continue with Unasyn, follow-up sputum cultures and blood cultures. We know her off the oxygen in the next 24 hours and continue with spirometry Cough medication as needed for cough. Recommend checking CXR  in about 2 to 4 weeks for resolution of the right-sided infiltrate.   Asthma No wheezing on exam continue with oxygen and DuoNebs    Essential hypertension Continue with Cardizem and Bystolic.   History of TIA Continue with Plavix 75 mg daily   Hyperlipidemia continue with atorvastatin .  Anxiety Continue with alprazolam 1 mg twice daily as needed.  GERD Continue Protonix and Pepcid  THERAPY evaluations ordered.    Estimated body mass index is 48.54 kg/m as calculated from the following:   Height as of this encounter: 5\' 11"  (1.803 m).   Weight as of this encounter: 157.9 kg.  Code Status: full code.  DVT Prophylaxis:  lovenox.    Level of Care: Level of care: Med-Surg Family Communication: none at bedside.   Disposition Plan:      Remains inpatient appropriate: possible d.c tomorrow, pending THERAPY evaluations.   Procedures:  None.   Consultants:   None.   Antimicrobials:   Anti-infectives (From admission, onward)    Start     Dose/Rate Route Frequency Ordered Stop   03/04/23 2100  Ampicillin-Sulbactam (UNASYN) 3 g in sodium chloride 0.9 % 100 mL IVPB        3 g 200 mL/hr over 30 Minutes Intravenous Every 6 hours 03/04/23 1932     03/04/23 1345  Ampicillin-Sulbactam (UNASYN) 3 g in sodium chloride 0.9 % 100 mL IVPB        3 g 200 mL/hr over 30 Minutes Intravenous  Once 03/04/23 1333 03/04/23 1446        Medications  Scheduled Meds:  atorvastatin  40 mg Oral Daily   buPROPion  300 mg Oral Daily   celecoxib  200 mg Oral Daily   clopidogrel  75 mg Oral Daily   dextromethorphan-guaiFENesin  1 tablet Oral BID   diltiazem  120 mg Oral Daily   enoxaparin (LOVENOX) injection  80 mg Subcutaneous Q24H   famotidine  40 mg Oral BID   gabapentin  300 mg Oral TID   magnesium oxide  400 mg Oral Daily   multivitamin with minerals  1 tablet Oral Daily   nebivolol  5 mg Oral Daily   QUEtiapine  150 mg Oral QHS   topiramate  50 mg Oral BID   venlafaxine XR  150 mg Oral Q breakfast   Continuous Infusions:  ampicillin-sulbactam (UNASYN) IV 3 g (03/05/23 0502)   PRN Meds:.acetaminophen **OR** acetaminophen, ALPRAZolam, ipratropium-albuterol, ondansetron **OR** ondansetron (ZOFRAN) IV, mouth rinse, pantoprazole, senna-docusate, SUMAtriptan    Subjective:   Safira Mattsson was seen and examined today.  Reports feeling better than yesterday. No chest pain or palpitations.   Objective:   Vitals:   03/04/23 1758 03/04/23 2123 03/05/23 0201 03/05/23 0608  BP:  110/61 112/69 115/70  Pulse:  90 83 90  Resp:  19 16 16   Temp:  98.2 F (36.8 C) (!) 97.5 F (36.4 C) (!) 97.4 F (36.3 C)  TempSrc:  Oral Oral Oral  SpO2:  93% 96% 97%  Weight: (!) 157.9 kg     Height: 5\' 11"  (1.803 m)       Intake/Output  Summary (Last 24 hours) at 03/05/2023 1043 Last data filed at 03/05/2023 0900 Gross per 24 hour  Intake 440 ml  Output --  Net 440 ml   Filed Weights   03/04/23 1758  Weight: (!) 157.9 kg     Exam General: Alert and oriented x 3, NAD Cardiovascular: S1 S2 auscultated, no murmurs, RRR Respiratory: Clear to auscultation bilaterally, no wheezing, rales or rhonchi Gastrointestinal: Soft, nontender, nondistended, + bowel sounds Ext: no pedal edema bilaterally Neuro: AAOx3, Cr N's II- XII. Strength 5/5 upper and lower extremities bilaterally Skin: No rashes Psych: Normal affect and demeanor, alert and oriented x3    Data Reviewed:  I have personally reviewed following labs and imaging studies   CBC Lab Results  Component Value Date   WBC 12.5 (H) 03/05/2023   RBC 4.22 03/05/2023   HGB 12.6 03/05/2023   HCT 39.6 03/05/2023   MCV 93.8 03/05/2023   MCH 29.9 03/05/2023   PLT 203 03/05/2023   MCHC 31.8 03/05/2023   RDW 15.7 (H) 03/05/2023   LYMPHSABS 2.0 03/04/2023   MONOABS 0.7 03/04/2023   EOSABS 0.1 03/04/2023   BASOSABS 0.0 03/04/2023     Last metabolic panel Lab Results  Component Value Date   NA 137 03/05/2023   K 3.4 (L) 03/05/2023   CL 105 03/05/2023   CO2 24 03/05/2023   BUN 17 03/05/2023   CREATININE 0.83 03/05/2023   GLUCOSE 100 (H) 03/05/2023   GFRNONAA >60 03/05/2023   GFRAA >60 02/07/2019   CALCIUM 8.2 (L) 03/05/2023   PHOS 3.0 11/03/2021   PROT 7.7 03/04/2023   ALBUMIN 4.1 03/04/2023   BILITOT 0.7 03/04/2023   ALKPHOS 83 03/04/2023   AST 27 03/04/2023   ALT 22 03/04/2023   ANIONGAP 8 03/05/2023    CBG (last 3)  No results for input(s): "GLUCAP" in the last 72 hours.    Coagulation Profile: No results for input(s): "INR", "PROTIME" in the last 168 hours.   Radiology Studies: DG Chest 2 View  Result Date: 03/04/2023 CLINICAL DATA:  Worsening shortness of breath for 2 days. Left-sided chest pain and productive cough.Vomiting. EXAM:  CHEST - 2 VIEW COMPARISON:  10/03/2022 FINDINGS: The heart size and mediastinal contours are within normal limits. Near-complete resolution of left lower lobe infiltrate since prior study. Mild bandlike opacity in the right lung base is  new and may be due to mild atelectasis or early infiltrate. No pleural effusion. IMPRESSION: Near-complete resolution of left lower lobe infiltrate since prior study. New mild right basilar atelectasis versus early infiltrate. Electronically Signed   By: Danae Orleans M.D.   On: 03/04/2023 14:38       Kathlen Mody M.D. [facility] 03/05/2023, 10:43 AM  Available via Epic secure chat 7am-7pm After 7 pm, please refer to night coverage provider listed on amion.

## 2023-03-05 NOTE — Care Management Obs Status (Signed)
MEDICARE OBSERVATION STATUS NOTIFICATION   Patient Details  Name: [name] MRN: [id number] Date of Birth: 12-Dec-1970   Medicare Observation Status Notification Given:  Yes    Otelia Santee, LCSW 03/05/2023, 11:11 AM

## 2023-03-05 NOTE — Progress Notes (Signed)
Mobility Specialist - Progress Note   03/05/23 0853  Mobility  Activity Ambulated with assistance in hallway  Level of Assistance Independent  Assistive Device None  Distance Ambulated (ft) 200 ft  Range of Motion/Exercises Active  Activity Response Tolerated well  Mobility Referral Yes  $Mobility charge 1 Mobility  Mobility Specialist Start Time (ACUTE ONLY) [id number]  Mobility Specialist Stop Time (ACUTE ONLY) [id number]  Mobility Specialist Time Calculation (min) (ACUTE ONLY) 9 min   Received in bed and agreed to mobility.  On Room Air, pt SpO2% was 92% and did not go lower. Returned to bed with all needs met.  Marilynne Halsted Mobility Specialist

## 2023-03-06 DIAGNOSIS — J9601 Acute respiratory failure with hypoxia: Secondary | ICD-10-CM

## 2023-03-06 DIAGNOSIS — I10 Essential (primary) hypertension: Secondary | ICD-10-CM | POA: Diagnosis not present

## 2023-03-06 DIAGNOSIS — J690 Pneumonitis due to inhalation of food and vomit: Secondary | ICD-10-CM | POA: Diagnosis not present

## 2023-03-06 LAB — CBC WITH DIFFERENTIAL/PLATELET
Abs Immature Granulocytes: 0.04 10*3/uL (ref 0.00–0.07)
Basophils Absolute: 0 10*3/uL (ref 0.0–0.1)
Basophils Relative: 0 %
Eosinophils Absolute: 0.3 10*3/uL (ref 0.0–0.5)
Eosinophils Relative: 2 %
HCT: 39.8 % (ref 36.0–46.0)
Hemoglobin: 12.1 g/dL (ref 12.0–15.0)
Immature Granulocytes: 0 %
Lymphocytes Relative: 26 %
Lymphs Abs: 2.9 10*3/uL (ref 0.7–4.0)
MCH: 28.9 pg (ref 26.0–34.0)
MCHC: 30.4 g/dL (ref 30.0–36.0)
MCV: 95 fL (ref 80.0–100.0)
Monocytes Absolute: 0.6 10*3/uL (ref 0.1–1.0)
Monocytes Relative: 5 %
Neutro Abs: 7.1 10*3/uL (ref 1.7–7.7)
Neutrophils Relative %: 67 %
Platelets: 209 10*3/uL (ref 150–400)
RBC: 4.19 MIL/uL (ref 3.87–5.11)
RDW: 15.7 % — ABNORMAL HIGH (ref 11.5–15.5)
WBC: 11 10*3/uL — ABNORMAL HIGH (ref 4.0–10.5)
nRBC: 0 % (ref 0.0–0.2)

## 2023-03-06 LAB — BASIC METABOLIC PANEL
Anion gap: 8 (ref 5–15)
BUN: 18 mg/dL (ref 6–20)
CO2: 23 mmol/L (ref 22–32)
Calcium: 8.8 mg/dL — ABNORMAL LOW (ref 8.9–10.3)
Chloride: 109 mmol/L (ref 98–111)
Creatinine, Ser: 0.85 mg/dL (ref 0.44–1.00)
GFR, Estimated: >60 mL/min (ref >60)
Glucose, Bld: 102 mg/dL — ABNORMAL HIGH (ref 70–99)
Potassium: 3.6 mmol/L (ref 3.5–5.1)
Sodium: 140 mmol/L (ref 135–145)

## 2023-03-06 MED ORDER — BENZONATATE 100 MG PO CAPS: 200.0000 mg | ORAL_CAPSULE | Freq: Three times a day (TID) | ORAL | Status: DC | PRN

## 2023-03-06 NOTE — Progress Notes (Signed)
PT Cancellation Note  Patient Details Name: [name] MRN: [id number] DOB: 15-Sep-1970   Cancelled Treatment:    Reason Eval/Treat Not Completed: PT screened, no needs identified, will sign off  Noting pt ambulated 200' with mobility independently yesterday.  Spoke with OT who reports pt independent and PT could screen.  Confirmed with pt, she feels comfortable walking and transfers- declined PT needs. Will sign off.  Anise Salvo, PT Acute Rehab [facility] [phone]  [name] 03/06/2023, 10:32 AM

## 2023-03-06 NOTE — Progress Notes (Signed)
[facility]                                                                               [name], is a 52 y.o. female, DOB - 07/18/1970, QIH:[id number] Admit date - 03/04/2023    Outpatient Primary MD for the patient is Rick Duff, PA-C  LOS - 0  days    Brief summary     52 y.o. female with medical history significant of anxiety, GERD, HTN, HLD, TIA, polyneuropathy, asthma, morbid obesity, hiatal hernia s/p repair with recurrent hiatal hernia, arthritis, guttate psoriasis, Barrett's esophagus, and aspiration pneumonia who [facility] for evaluation of shortness of breath and cough.  She was admitted for aspiration pneumonia.    CXR showed near complete resolution of the LLL infiltrate but new mild right basilar atelectasis versus early infiltrate. Admitted to [facility] service and transferred to [facility] & Plan    Assessment and Plan:   Aspiration pneumonia Continue with Unasyn, follow-up sputum cultures and blood cultures. Negative so far.  Weaned her off oxygen. Patient reports some blood while coughing, very mild.  Recommend checking CXR  in about 2 to 4 weeks for resolution of the right-sided infiltrate.   Asthma No wheezing on exam  continue with oxygen and DuoNebs    Essential hypertension Well controlled. No changes in meds.  Continue with Cardizem and Bystolic.   History of TIA Continue with Plavix 75 mg daily   Hyperlipidemia  continue with atorvastatin .  Anxiety Continue with alprazolam 1 mg twice daily as needed.  GERD Continue Protonix and Pepcid  THERAPY evaluations ordered. No follow up needed.    Estimated body mass index is 48.54 kg/m as calculated from the following:   Height as of this encounter: 5\' 11"  (1.803 m).   Weight as of this encounter: 157.9 kg.  Code Status: full code.  DVT Prophylaxis:  lovenox.    Level of Care: Level of care: Med-Surg Family Communication:  none at bedside.   Disposition Plan:     Remains inpatient appropriate: discharge home tomorrow.   Procedures:  None.   Consultants:   None.   Antimicrobials:   Anti-infectives (From admission, onward)    Start     Dose/Rate Route Frequency Ordered Stop   03/04/23 2100  Ampicillin-Sulbactam (UNASYN) 3 g in sodium chloride 0.9 % 100 mL IVPB        3 g 200 mL/hr over 30 Minutes Intravenous Every 6 hours 03/04/23 1932     03/04/23 1345  Ampicillin-Sulbactam (UNASYN) 3 g in sodium chloride 0.9 % 100 mL IVPB        3 g 200 mL/hr over 30 Minutes Intravenous  Once 03/04/23 1333 03/04/23 1446        Medications  Scheduled Meds:  atorvastatin  40 mg Oral Daily   buPROPion  300 mg Oral Daily   celecoxib  200 mg Oral Daily   clopidogrel  75 mg Oral Daily   dextromethorphan-guaiFENesin  1 tablet Oral BID   diltiazem  120 mg Oral Daily   enoxaparin (LOVENOX) injection  80 mg Subcutaneous Q24H   famotidine  40 mg  Oral BID   gabapentin  300 mg Oral TID   magnesium oxide  400 mg Oral Daily   multivitamin with minerals  1 tablet Oral Daily   nebivolol  5 mg Oral Daily   QUEtiapine  150 mg Oral QHS   topiramate  50 mg Oral BID   venlafaxine XR  150 mg Oral Q breakfast   Continuous Infusions:  ampicillin-sulbactam (UNASYN) IV 3 g (03/06/23 1140)   PRN Meds:.acetaminophen **OR** acetaminophen, ALPRAZolam, ipratropium-albuterol, ondansetron **OR** ondansetron (ZOFRAN) IV, mouth rinse, pantoprazole, senna-docusate, SUMAtriptan    Subjective:   Arelis Nighswander was seen and examined today.  Still coughing, no chest pain. No nausea, vomiting.  Objective:   Vitals:   03/05/23 1341 03/05/23 1918 03/06/23 0418 03/06/23 1213  BP: 125/74 (!) 131/113 113/86 121/75  Pulse: 76 86 81 79  Resp: 18 16 16 18   Temp: 97.9 F (36.6 C) 98.4 F (36.9 C) 97.6 F (36.4 C) 97.8 F (36.6 C)  TempSrc: Oral Oral Oral Oral  SpO2: 95% 94% 96% 96%  Weight:      Height:        Intake/Output  Summary (Last 24 hours) at 03/06/2023 1231 Last data filed at 03/06/2023 1057 Gross per 24 hour  Intake 1035 ml  Output --  Net 1035 ml   Filed Weights   03/04/23 1758  Weight: (!) 157.9 kg     Exam General exam: Appears calm and comfortable  Respiratory system: Clear to auscultation. Respiratory effort normal. Cardiovascular system: S1 & S2 heard, RRR. Gastrointestinal system: Abdomen is nondistended, soft and nontender.  Central nervous system: Alert and oriented.  Extremities: Symmetric 5 x 5 power. Skin: No rashes,  Psychiatry:  Mood & affect appropriate.     Data Reviewed:  I have personally reviewed following labs and imaging studies   CBC Lab Results  Component Value Date   WBC 11.0 (H) 03/06/2023   RBC 4.19 03/06/2023   HGB 12.1 03/06/2023   HCT 39.8 03/06/2023   MCV 95.0 03/06/2023   MCH 28.9 03/06/2023   PLT 209 03/06/2023   MCHC 30.4 03/06/2023   RDW 15.7 (H) 03/06/2023   LYMPHSABS 2.9 03/06/2023   MONOABS 0.6 03/06/2023   EOSABS 0.3 03/06/2023   BASOSABS 0.0 03/06/2023     Last metabolic panel Lab Results  Component Value Date   NA 140 03/06/2023   K 3.6 03/06/2023   CL 109 03/06/2023   CO2 23 03/06/2023   BUN 18 03/06/2023   CREATININE 0.85 03/06/2023   GLUCOSE 102 (H) 03/06/2023   GFRNONAA >60 03/06/2023   GFRAA >60 02/07/2019   CALCIUM 8.8 (L) 03/06/2023   PHOS 3.0 11/03/2021   PROT 7.7 03/04/2023   ALBUMIN 4.1 03/04/2023   BILITOT 0.7 03/04/2023   ALKPHOS 83 03/04/2023   AST 27 03/04/2023   ALT 22 03/04/2023   ANIONGAP 8 03/06/2023    CBG (last 3)  No results for input(s): "GLUCAP" in the last 72 hours.    Coagulation Profile: No results for input(s): "INR", "PROTIME" in the last 168 hours.   Radiology Studies: DG Chest 2 View  Result Date: 03/04/2023 CLINICAL DATA:  Worsening shortness of breath for 2 days. Left-sided chest pain and productive cough.Vomiting. EXAM: CHEST - 2 VIEW COMPARISON:  10/03/2022 FINDINGS: The  heart size and mediastinal contours are within normal limits. Near-complete resolution of left lower lobe infiltrate since prior study. Mild bandlike opacity in the right lung base is new and may be due to mild  atelectasis or early infiltrate. No pleural effusion. IMPRESSION: Near-complete resolution of left lower lobe infiltrate since prior study. New mild right basilar atelectasis versus early infiltrate. Electronically Signed   By: Danae Orleans M.D.   On: 03/04/2023 14:38       Kathlen Mody M.D. [facility] 03/06/2023, 12:31 PM  Available via Epic secure chat 7am-7pm After 7 pm, please refer to night coverage provider listed on amion.

## 2023-03-06 NOTE — Evaluation (Signed)
Occupational Therapy Evaluation/Discharge Patient Details Name: [name] MRN: [id number] DOB: 1970-06-19 Today's Date: 03/06/2023   History of Present Illness 52 y.o. female with medical history significant of anxiety, GERD, HTN, HLD, TIA, polyneuropathy, asthma, morbid obesity, hiatal hernia s/p repair with recurrent hiatal hernia, arthritis, guttate psoriasis, Barrett's esophagus, and aspiration pneumonia who [facility] for evaluation of shortness of breath and cough. Pt was transfered to [facility] for continued care.   Clinical Impression   Patient evaluated by Occupational Therapy with no further acute OT needs identified. All education has been completed and the patient has no further questions. Pt is functioning at baseline and is independent/Mod I for all ADL tasks and functional mobility. No follow-up Occupational Therapy or equipment needs. OT is signing off. Thank you for this referral.        If plan is discharge home, recommend the following:  N/A    Functional Status Assessment  Patient has not had a recent decline in their functional status  Equipment Recommendations  None recommended by OT       Precautions / Restrictions Precautions Precautions: None Restrictions Weight Bearing Restrictions: No      Mobility Bed Mobility Overal bed mobility: Independent     Transfers Overall transfer level: Independent Equipment used: None         Balance Overall balance assessment: Independent        ADL either performed or assessed with clinical judgement   ADL Overall ADL's : Independent;Modified independent;At baseline          Vision Baseline Vision/History: 1 Wears glasses Ability to See in Adequate Light: 0 Adequate Patient Visual Report: No change from baseline Vision Assessment?: No apparent visual deficits     Perception Perception: Not tested       Praxis Praxis: Not tested       Pertinent Vitals/Pain  Pain Assessment Pain Assessment: 0-10 Pain Score: 2  Pain Location: generalized all over Pain Descriptors / Indicators: Aching Pain Intervention(s): Monitored during session     Extremity/Trunk Assessment Upper Extremity Assessment Upper Extremity Assessment: Overall WFL for tasks assessed   Lower Extremity Assessment Lower Extremity Assessment: Overall WFL for tasks assessed   Cervical / Trunk Assessment Cervical / Trunk Assessment: Normal   Communication Communication Communication: No apparent difficulties   Cognition Arousal: Alert Behavior During Therapy: WFL for tasks assessed/performed Overall Cognitive Status: Within Functional Limits for tasks assessed                      General Comments  VSS on RA On RA, SpO2 reading 92%. Per MD order goal is 94%. 2L O2 replaced and SpO2 increased to goal of 94%. Pt was educated to wear oxygen before and after her shower. Education provided to leave door cracked if necessary to decrease amount of steam in bathroom which can make it harder to breathe. Pt verbalized understanding.             Home Living Family/patient expects to be discharged to:: Private residence Living Arrangements: Parent;Children (Mother and Son) Available Help at Discharge: Family;Available 24 hours/day Type of Home: House Home Access: Stairs to enter [employer] of Steps: 5 Entrance Stairs-Rails: Right;Left;Can reach both Home Layout: One level     Bathroom Shower/Tub: Tub/shower unit;Walk-in shower   Bathroom Toilet: Standard Bathroom Accessibility: Yes   Home Equipment: BSC/3in1;Shower seat;Rolling Environmental consultant (2 wheels);Crutches;Cane - single point   Additional Comments: Mother lives with pt. Pt completes  medication management and transportation.      Prior Functioning/Environment Prior Level of Function : Independent/Modified Independent;Driving  Mobility Comments: Does use an AD ADLs Comments: Does not work.        OT  Problem List: Decreased strength         OT Goals(Current goals can be found in the care plan section) Acute Rehab OT Goals Patient Stated Goal: to take shower OT Goal Formulation: All assessment and education complete, DC therapy  OT Frequency:  1X visit       AM-PAC OT "6 Clicks" Daily Activity     Outcome Measure Help from another person eating meals?: None Help from another person taking care of personal grooming?: None Help from another person toileting, which includes using toliet, bedpan, or urinal?: None Help from another person bathing (including washing, rinsing, drying)?: None Help from another person to put on and taking off regular upper body clothing?: None Help from another person to put on and taking off regular lower body clothing?: None 6 Click Score: 24   End of Session Nurse Communication: Mobility status;Other (comment) (request to take a shower)  Activity Tolerance: Patient tolerated treatment well Patient left: in bed;with call bell/phone within reach  OT Visit Diagnosis: Muscle weakness (generalized) (M62.81)                Time: 3664-4034 OT Time Calculation (min): 23 min Charges:  OT General Charges $OT Visit: 1 Visit OT Evaluation $OT Eval Low Complexity: 1 Low  Limmie Patricia, OTR/L,CBIS  Supplemental OT - [facility] and [facility] Secure Chat Preferred    Pratt Bress, Charisse March 03/06/2023, 10:19 AM

## 2023-03-07 DIAGNOSIS — J9601 Acute respiratory failure with hypoxia: Secondary | ICD-10-CM | POA: Diagnosis not present

## 2023-03-07 DIAGNOSIS — J690 Pneumonitis due to inhalation of food and vomit: Secondary | ICD-10-CM | POA: Diagnosis not present

## 2023-03-07 MED ORDER — DM-GUAIFENESIN ER 30-600 MG PO TB12: 1.0000 | ORAL_TABLET | Freq: Two times a day (BID) | ORAL | 0 refills | Status: DC

## 2023-03-07 MED ORDER — AMOXICILLIN-POT CLAVULANATE 875-125 MG PO TABS: 1.0000 | ORAL_TABLET | Freq: Two times a day (BID) | ORAL | 0 refills | Status: AC

## 2023-03-07 MED ORDER — BENZONATATE 200 MG PO CAPS: 200.0000 mg | ORAL_CAPSULE | Freq: Three times a day (TID) | ORAL | 0 refills | Status: DC | PRN

## 2023-03-07 NOTE — Progress Notes (Signed)
Mobility Specialist - Progress Note   03/07/23 0900  Mobility  Activity Ambulated with assistance in hallway  Level of Assistance Independent after set-up  Assistive Device None  Distance Ambulated (ft) 500 ft  Range of Motion/Exercises Active  Activity Response Tolerated well  Mobility Referral Yes  $Mobility charge 1 Mobility  Mobility Specialist Start Time (ACUTE ONLY) 0847  Mobility Specialist Stop Time (ACUTE ONLY) 0856  Mobility Specialist Time Calculation (min) (ACUTE ONLY) 9 min   Received in bed and agreed to mobility. Had no issues throughout session and returned to bed with all needs met.  Marilynne Halsted Mobility Specialist

## 2023-03-09 LAB — CULTURE, RESPIRATORY W GRAM STAIN: Culture: NORMAL

## 2023-03-09 LAB — CULTURE, BLOOD (ROUTINE X 2)
Culture: NO GROWTH
Culture: NO GROWTH
Special Requests: ADEQUATE
Special Requests: ADEQUATE

## 2023-03-10 NOTE — Discharge Summary (Signed)
Physician Discharge Summary   Patient: [name] MRN: [id number] DOB: 1971/02/14  Admit date:     03/04/2023  Discharge date: 03/07/2023  Discharge Physician: Kathlen Mody   PCP: Rick Duff, PA-C   Recommendations at discharge:  Please follow up with PCP in one week.  Please follow up with cbc and bmp in one week.   Discharge Diagnoses: Principal Problem:   Aspiration pneumonia (HCC) Active Problems:   Acute respiratory failure with hypoxia [facility])   Hospital Course:       52 y.o. female with medical history significant of anxiety, GERD, HTN, HLD, TIA, polyneuropathy, asthma, morbid obesity, hiatal hernia s/p repair with recurrent hiatal hernia, arthritis, guttate psoriasis, Barrett's esophagus, and aspiration pneumonia who [facility] for evaluation of shortness of breath and cough.  She was admitted for aspiration pneumonia.      CXR showed near complete resolution of the LLL infiltrate but new mild right basilar atelectasis versus early infiltrate. Admitted to [facility] service and transferred to [facility] and Plan:    Aspiration pneumonia Continue with Unasyn, follow-up sputum cultures and blood cultures. Negative so far.  Weaned her off oxygen. Patient reports some blood while coughing, very mild.  Recommend checking CXR  in about 2 to 4 weeks for resolution of the right-sided infiltrate.     Asthma No wheezing on exam  continue with oxygen and DuoNebs     Essential hypertension Well controlled. No changes in meds.  Continue with Cardizem and Bystolic.     History of TIA Continue with Plavix 75 mg daily     Hyperlipidemia  continue with atorvastatin .   Anxiety Continue with alprazolam 1 mg twice daily as needed.   GERD Continue Protonix and Pepcid   THERAPY evaluations ordered. No follow up needed.      Consultants: none Procedures performed: none  Disposition: Home Diet recommendation:  Discharge  Diet Orders (From admission, onward)     Start     Ordered   03/07/23 0000  Diet - low sodium heart healthy        03/07/23 1120           Regular diet DISCHARGE MEDICATION: Allergies as of 03/07/2023       Reactions   Haemophilus B Polysaccharide Vaccine Swelling, Other (See Comments)   Redness at injection site (approximatly golf ball size) hot to the touch   Lisinopril Hives   Lisinopril-hydrochlorothiazide Hives, Other (See Comments)   Welts, too   Wound Dressings Hives   Alpha-gal Diarrhea, Nausea And Vomiting, Other (See Comments)   Blisters inside of mouth, too   Beef-derived Products Diarrhea, Nausea And Vomiting, Other (See Comments)   Alpha-gal   Influenza Vaccines    States her "arm had a knot and turned red"   Adhesive [tape] Rash, Other (See Comments)   Also, blisters   Wound Dressing Adhesive Rash, Other (See Comments)   Blisters, too        Medication List     TAKE these medications    acetaminophen 500 MG tablet Commonly known as: TYLENOL Take 1,500 mg by mouth 2 (two) times daily as needed for mild pain (pain score 1-3), moderate pain (pain score 4-6), headache or fever.   albuterol 108 (90 Base) MCG/ACT inhaler Commonly known as: VENTOLIN HFA Inhale 1-2 puffs into the lungs every 6 (six) hours as needed for wheezing or shortness of breath. What changed: when to take this  ALPRAZolam 1 MG tablet Commonly known as: XANAX Take 1 mg by mouth in the morning and at bedtime.   amoxicillin-clavulanate 875-125 MG tablet Commonly known as: AUGMENTIN Take 1 tablet by mouth 2 (two) times daily for 5 days.   atorvastatin 40 MG tablet Commonly known as: LIPITOR Take 1 tablet (40 mg total) by mouth daily.   azelastine 0.1 % nasal spray Commonly known as: ASTELIN Place 2 sprays into both nostrils 2 (two) times daily as needed for rhinitis or allergies.   benzonatate 200 MG capsule Commonly known as: TESSALON Take 1 capsule (200 mg total) by mouth  3 (three) times daily as needed for cough.   Black Cohosh 540 MG Caps Take 540 mg by mouth at bedtime.   buPROPion 300 MG 24 hr tablet Commonly known as: WELLBUTRIN XL Take 300 mg by mouth daily.   celecoxib 200 MG capsule Commonly known as: CELEBREX Take 200 mg by mouth daily.   clopidogrel 75 MG tablet Commonly known as: PLAVIX Take 75 mg by mouth daily.   dextromethorphan-guaiFENesin 30-600 MG 12hr tablet Commonly known as: MUCINEX DM Take 1 tablet by mouth 2 (two) times daily.   diltiazem 120 MG 24 hr capsule Commonly known as: CARDIZEM CD Take 120 mg by mouth daily.   famotidine 40 MG tablet Commonly known as: PEPCID Take 40 mg by mouth 2 (two) times daily.   fluticasone 50 MCG/ACT nasal spray Commonly known as: FLONASE Place 2 sprays into both nostrils daily as needed for allergies or rhinitis.   gabapentin 300 MG capsule Commonly known as: NEURONTIN Take 300 mg by mouth 3 (three) times daily.   levocetirizine 5 MG tablet Commonly known as: XYZAL Take 5 mg by mouth every evening.   magnesium oxide 400 (240 Mg) MG tablet Commonly known as: MAG-OX Take 400 mg by mouth daily.   Mounjaro 7.5 MG/0.5ML Pen Generic drug: tirzepatide Inject 7.5 mg into the skin every Wednesday.   Multi-Vitamin tablet Take 1 tablet by mouth daily with breakfast.   nebivolol 5 MG tablet Commonly known as: BYSTOLIC Take 5 mg by mouth at bedtime.   pantoprazole 40 MG tablet Commonly known as: PROTONIX Take 40 mg by mouth in the morning and at bedtime.   promethazine 25 MG tablet Commonly known as: PHENERGAN Take 25 mg by mouth daily as needed for nausea or vomiting.   QUEtiapine Fumarate 150 MG Tabs Take 150 mg by mouth at bedtime.   silver sulfADIAZINE 1 % cream Commonly known as: SILVADENE Apply 1 Application topically See admin instructions. Apply to the left great toe 2 times a day   Stelara 90 MG/ML Sosy injection Generic drug: ustekinumab Inject 90 mg into the  skin every 3 (three) months.   SUMAtriptan 20 MG/ACT nasal spray Commonly known as: IMITREX Place 1 spray (20 mg total) into the nose as needed for migraine or headache. May repeat in 2 hours if headache persists or recurs. What changed:  reasons to take this additional instructions   topiramate 50 MG tablet Commonly known as: TOPAMAX Take 1 tablet (50 mg total) by mouth 2 (two) times daily.   venlafaxine XR 150 MG 24 hr capsule Commonly known as: EFFEXOR-XR Take 150 mg by mouth daily with breakfast.        Follow-up Information     Rick Duff, PA-C. Schedule an appointment as soon as possible for a visit in 1 week(s).   Specialty: Physician Assistant Contact information: [address]  [place] [address] [phone]                Discharge Exam: Ceasar Mons Weights   03/04/23 1758  Weight: (!) 157.9 kg   General exam: Appears calm and comfortable  Respiratory system: Clear to auscultation. Respiratory effort normal. Cardiovascular system: S1 & S2 heard, RRR. No JVD, murmurs, rubs, gallops or clicks. No pedal edema. Gastrointestinal system: Abdomen is nondistended, soft and nontender. No organomegaly or masses felt. Normal bowel sounds heard. Central nervous system: Alert and oriented. No focal neurological deficits. Extremities: Symmetric 5 x 5 power. Skin: No rashes, lesions or ulcers Psychiatry: Judgement and insight appear normal. Mood & affect appropriate.    Condition at discharge: fair  The results of significant diagnostics from this hospitalization (including imaging, microbiology, ancillary and laboratory) are listed below for reference.   Imaging Studies: DG Chest 2 View  Result Date: 03/04/2023 CLINICAL DATA:  Worsening shortness of breath for 2 days. Left-sided chest pain and productive cough.Vomiting. EXAM: CHEST - 2 VIEW COMPARISON:  10/03/2022 FINDINGS: The heart size and mediastinal contours are within normal limits.  Near-complete resolution of left lower lobe infiltrate since prior study. Mild bandlike opacity in the right lung base is new and may be due to mild atelectasis or early infiltrate. No pleural effusion. IMPRESSION: Near-complete resolution of left lower lobe infiltrate since prior study. New mild right basilar atelectasis versus early infiltrate. Electronically Signed   By: Danae Orleans M.D.   On: 03/04/2023 14:38    Microbiology: Results for orders placed or performed during the hospital encounter of 03/04/23  Blood culture (routine x 2)     Status: None   Collection Time: 03/04/23 12:18 PM   Specimen: BLOOD RIGHT ARM  Result Value Ref Range Status   Specimen Description BLOOD RIGHT ARM  Final   Special Requests   Final    BOTTLES DRAWN AEROBIC AND ANAEROBIC Blood Culture adequate volume   Culture   Final    NO GROWTH 5 DAYS Performed at [facility], [address], [place], [place] [address]    Report Status 03/09/2023 FINAL  Final  Blood culture (routine x 2)     Status: None   Collection Time: 03/04/23 12:41 PM   Specimen: BLOOD  Result Value Ref Range Status   Specimen Description   Final    BLOOD LEFT ANTECUBITAL Performed at [facility], [address] [place], [place] [address]    Special Requests   Final    BOTTLES DRAWN AEROBIC AND ANAEROBIC Blood Culture adequate volume Performed at [facility], [address] [place], [place] [address]    Culture   Final    NO GROWTH 5 DAYS Performed at [facility], [address], [place], [place] [address]    Report Status 03/09/2023 FINAL  Final  Resp panel by RT-PCR (RSV, Flu A&B, Covid) Anterior Nasal Swab     Status: None   Collection Time: 03/04/23 12:42 PM   Specimen: Anterior Nasal Swab  Result Value Ref Range Status   SARS Coronavirus 2 by RT PCR NEGATIVE NEGATIVE Final    Comment: (NOTE) SARS-CoV-2 target nucleic acids are NOT DETECTED.  The SARS-CoV-2 RNA is  generally detectable in upper respiratory specimens during the acute phase of infection. The lowest concentration of SARS-CoV-2 viral copies this assay can detect is 138 copies/mL. A negative result does not preclude SARS-Cov-2 infection and should not be used as the sole basis for treatment or other  patient management decisions. A negative result may occur with  improper specimen collection/handling, submission of specimen other than nasopharyngeal swab, presence of viral mutation(s) within the areas targeted by this assay, and inadequate number of viral copies(<138 copies/mL). A negative result must be combined with clinical observations, patient history, and epidemiological information. The expected result is Negative.  Fact Sheet for Patients:  BloggerCourse.com  Fact Sheet for Healthcare Providers:  SeriousBroker.it  This test is no t yet approved or cleared by the [place] FDA and  has been authorized for detection and/or diagnosis of SARS-CoV-2 by FDA under an Emergency Use Authorization (EUA). This EUA will remain  in effect (meaning this test can be used) for the duration of the COVID-19 declaration under Section 564(b)(1) of the Act, 21 U.S.C.section 360bbb-3(b)(1), unless the authorization is terminated  or revoked sooner.       Influenza A by PCR NEGATIVE NEGATIVE Final   Influenza B by PCR NEGATIVE NEGATIVE Final    Comment: (NOTE) The Xpert Xpress SARS-CoV-2/FLU/RSV plus assay is intended as an aid in the diagnosis of influenza from Nasopharyngeal swab specimens and should not be used as a sole basis for treatment. Nasal washings and aspirates are unacceptable for Xpert Xpress SARS-CoV-2/FLU/RSV testing.  Fact Sheet for Patients: BloggerCourse.com  Fact Sheet for Healthcare Providers: SeriousBroker.it  This test is not yet approved or cleared by the [place] FDA and has been authorized for detection and/or diagnosis of SARS-CoV-2 by FDA under an Emergency Use Authorization (EUA). This EUA will remain in effect (meaning this test can be used) for the duration of the COVID-19 declaration under Section 564(b)(1) of the Act, 21 U.S.C. section 360bbb-3(b)(1), unless the authorization is terminated or revoked.     Resp Syncytial Virus by PCR NEGATIVE NEGATIVE Final    Comment: (NOTE) Fact Sheet for Patients: BloggerCourse.com  Fact Sheet for Healthcare Providers: SeriousBroker.it  This test is not yet approved or cleared by the [place] FDA and has been authorized for detection and/or diagnosis of SARS-CoV-2 by FDA under an Emergency Use Authorization (EUA). This EUA will remain in effect (meaning this test can be used) for the duration of the COVID-19 declaration under Section 564(b)(1) of the Act, 21 U.S.C. section 360bbb-3(b)(1), unless the authorization is terminated or revoked.  Performed at [facility], [address] [place], [place] [address]   Expectorated Sputum Assessment w Gram Stain, Rflx to Resp Cult     Status: None   Collection Time: 03/04/23 10:44 PM   Specimen: Expectorated Sputum  Result Value Ref Range Status   Specimen Description EXPECTORATED SPUTUM  Final   Special Requests NONE  Final   Sputum evaluation   Final    Sputum specimen not acceptable for testing.  Please recollect.   NOTIFIED Adria Devon RN Performed at [facility], [address] [place], [place] [address]    Report Status 03/04/2023 FINAL  Final  Expectorated Sputum Assessment w Gram Stain, Rflx to Resp Cult     Status: None   Collection Time: 03/05/23  9:41 AM   Specimen: Sputum  Result Value Ref Range Status   Specimen Description SPUTUM  Final   Special Requests NONE  Final   Sputum evaluation   Final    THIS SPECIMEN IS ACCEPTABLE FOR  SPUTUM CULTURE Performed at [facility], [address], [place], [place] [address]    Report Status 03/05/2023 FINAL  Final  Culture, Respiratory w Gram Stain  Status: None   Collection Time: 03/05/23  9:41 AM   Specimen: SPU  Result Value Ref Range Status   Specimen Description   Final    SPUTUM Performed at [facility], [address], [place], [place] [address]    Special Requests   Final    NONE Reflexed from [id number] Performed at [facility], [address] [place], [place] [address]    Gram Stain   Final    ABUNDANT WBC PRESENT, PREDOMINANTLY PMN NO ORGANISMS SEEN    Culture   Final    FEW Normal respiratory flora-no Staph aureus or Pseudomonas seen Performed at [facility], [address] [place], [place] [address]    Report Status 03/09/2023 FINAL  Final    Labs: CBC: Recent Labs  Lab 03/04/23 1242 03/05/23 0550 03/06/23 0526  WBC 18.3* 12.5* 11.0*  NEUTROABS 15.5*  --  7.1  HGB 14.2 12.6 12.1  HCT 44.1 39.6 39.8  MCV 90.9 93.8 95.0  PLT 240 203 209   Basic Metabolic Panel: Recent Labs  Lab 03/04/23 1242 03/05/23 0550 03/06/23 0526  NA 139 137 140  K 4.0 3.4* 3.6  CL 104 105 109  CO2 24 24 23   GLUCOSE 123* 100* 102*  BUN 17 17 18   CREATININE 0.88 0.83 0.85  CALCIUM 9.5 8.2* 8.8*  MG  --  2.1  --    Liver Function Tests: Recent Labs  Lab 03/04/23 1242  AST 27  ALT 22  ALKPHOS 83  BILITOT 0.7  PROT 7.7  ALBUMIN 4.1   CBG: No results for input(s): "GLUCAP" in the last 168 hours.  Discharge time spent: 42 minutes  Signed: Kathlen Mody, MD [facility]

## 2023-03-17 ENCOUNTER — Ambulatory Visit: Payer: Medicare HMO | Admitting: Adult Health

## 2023-04-02 ENCOUNTER — Institutional Professional Consult (permissible substitution): Payer: Medicare HMO | Admitting: Pulmonary Disease

## 2023-04-23 NOTE — Progress Notes (Signed)
[facility] [address] [place]. [place] [address] [phone]       OFFICE FOLLOW-UP NOTE  [name] Date of Birth:  [birth year] Medical Record Number:  [id number]    Primary neurologist: Dr. Pearlean Brownie Reason for visit: Migraine follow-up  Chief Complaint  Patient presents with   Follow-up    Pt alone, rm 3. Here today for follow up. States that on the top of her head there is a spot that seems to remain tender and if she touces it such as washing/combing hair. States that this has not always been there. It has been noticeable in the last 3/4 months. States that headaches have slightly worsened. She is averaging about 10 a month. Takes topiramate 50 mg BID. They seem to always start posterior right occipital area. The sumatriptan helps.     HPI:  Update 04/24/2023 JM: Patient returns for follow-up visit after prior visit with Dr. Pearlean Brownie 10 months ago.    She reports worsening of headaches over the past 3 to 4 months, primarily on the right side, starting occipital and radiating up towards the vertex, sharp stabbing pain which can last several minutes or until she takes sumatriptan. Can feel like right lower eyelid feels heavy or twitching when headache present.  She has also noticed occasionally she will have a painful area right vertex, can be sensitive to touch especially with brushing her hair. Has been using an OTC muscle pain reliever in that area when needed which has been helpful.  She does have chronic neck pain, previously seen by neurosurgery in [facility] for cervical spondylosis.  She has continued on topiramate 50 mg twice daily.  She also complains of memory difficulties, primarily with short-term but occasionally long-term such as recently forgetting that she has previously had a miscarriage but then was able to recall this.  Is scheduled to establish care with psychiatry next month, has history of depression and PTSD.  She is currently on  bupropion, Seroquel, Effexor and Xanax as needed.  She also has underlying sleep apnea with CPAP intolerance, followed by [facility] neurology, plans on undergoing nocturnal polysomnogram and possible use of BiPAP for improved tolerance. She was hospitalized in June and November for aspiration pneumonia.       History provided for reference purposes only Update 06/26/2022 Dr. Pearlean Brownie: She returns for follow-up after last visit with me 5 months ago.  She states she is doing quite well.  She has had no further episodes of TIA, stroke or complicated migraine episodes.  Continues to have a few severe headaches once a month or so which respond to Imitrex.  He has a couple of minor headaches a month as well.  She remains on Topamax 50 mg twice daily which she is tolerating well without significant side effects.  She does participate in activities like meditation and goes to church for relaxation.  Her cardiologist changed her from aspirin to Plavix for cardiac and stroke prevention recently.  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  She has no new complaints.    Initial visit 01/08/2022 Dr. Pearlean Brownie: Ms Chestine Walter is a 53 year old Caucasian lady seen today for initial office follow-up visit following hospital consultation in July 2023.[name] is an 53 y.o. female with a PMHx of anxiety, arthritis, asthma, major depressive disorder, history of suicide attempt in 2018, HTN and psoriasis who presented to the  [facility] she is tired.  On 11/03/21 afternoon with a c/c of headache that had started the day prior at about noon. The headache was accompanied by balance issues and dizziness. She also had felt at that time that her left eye felt droopy. She had some nausea but no emesis. Headache was holocephalic but worse on the left side in the parietal occipital region, throbbing "like a jackhammer" and rated as 8/10 at its worst,  with associated photophobia but without osmophobia or sonophobia. She was afebrile with BP of 125/85 and normal O2 sats. Exam by EDP revealed left upper extremity dysmetria, raising concern for CVA. CTA of head and neck revealed moderate tortuosity of the cervical internal carotid arteries bilaterally without significant stenosis and was otherwise unremarkable.  MRI scan of the brain showed no acute abnormality.  LDL cholesterol was 78 mg percent.  Hemoglobin A1c of 6.0.  Echocardiogram showed ejection fraction of 60 to 55% without cardiac source embolism.  He was treated with headache cocktail which resolved the headache.  He was diagnosed with complicated migraine episode.  She was prescribed Imitrex nasal spray.  She states she is done well since discharge.  Continues to have headaches with the Norvasc.  At the previous but the Imitrex nasal.  Works pretty well and release the headache.  She describes headache as occurring at  frequency of once a week.  Headaches are moderate 8/10 in severity beginning in the back of her shoulders and back of the head and is throbbing in nature and  last 4 hours .  There is light and sound sensitivity.  She also describes tightness in the muscles of the neck.  Headaches are triggered by stress, increase physical activity t and tiredness.  She has never been on headache prophylactic medications.  She denies any recurrent stroke or TIA symptoms.  She is tolerating treatment well without bleeding or bruising.  She is also tolerating Lipitor well without muscle aches and pains..     ROS:   14 system review of systems is positive for those listed in HPI and all other systems negative PMH:  Past Medical History:  Diagnosis Date   Anxiety    Arthritis    joints   Asthma    Endometriosis    Fat necrosis of breast    right   GERD (gastroesophageal reflux disease)    History of hiatal hernia    s/p repair few times   History of suicide attempt 03/2017   Hypertension     MDD (major depressive disorder)    Panniculitis    Pneumonia 10/2018   in hospital with pneumonia   PONV (postoperative nausea and vomiting)    Psoriasis    treated with Humeria    Social History:  Social History   Socioeconomic History   Marital status: Widowed    Spouse name: Not on file   Number of children: Not on file   Years of education: Not on file   Highest education level: Not on file  Occupational History   Not on file  Tobacco Use   Smoking status: Every Day    Current packs/day: 0.00    Average packs/day: 1 pack/day for 20.0 years (20.0 ttl pk-yrs)    Types: Cigarettes    Start date: 05/20/1995    Last attempt to quit: 05/20/2015    Years since quitting: 7.9   Smokeless tobacco: Never   Tobacco comments:    < 1 pack  Vaping Use   Vaping  status: Never Used  Substance and Sexual Activity   Alcohol use: Yes    Comment: socially   Drug use: Never   Sexual activity: Yes    Birth control/protection: Surgical  Other Topics Concern   Not on file  Social History Narrative   Mother lives with her.       Social Drivers of Corporate investment banker Strain: Low Risk  (09/10/2022)   Received from [facility], [facility]   Overall Financial Resource Strain (CARDIA)    Difficulty of Paying Living Expenses: Not very hard  Food Insecurity: No Food Insecurity (03/04/2023)   Hunger Vital Sign    Worried About Running Out of Food in the Last Year: Never true    Ran Out of Food in the Last Year: Never true  Transportation Needs: No Transportation Needs (03/04/2023)   PRAPARE - Administrator, Civil Service (Medical): No    Lack of Transportation (Non-Medical): No  Physical Activity: Insufficiently Active (09/10/2022)   Received from [facility], [facility]   Exercise Vital Sign    Days of Exercise per Week: 2 days    Minutes of Exercise per Session: 30 min  Stress: Stress Concern  Present (09/10/2022)   Received from [facility], [facility]   [employer] of Occupational Health - Occupational Stress Questionnaire    Feeling of Stress : To some extent  Social Connections: Moderately Integrated (09/10/2022)   Received from [facility], [facility]   Social Connection and Isolation Panel [NHANES]    Frequency of Communication with Friends and Family: More than three times a week    Frequency of Social Gatherings with Friends and Family: Not on file    Attends Religious Services: More than 4 times per year    Active Member of [employer] or Organizations: Yes    Attends Banker Meetings: More than 4 times per year    Marital Status: Widowed  Intimate Partner Violence: Not At Risk (03/04/2023)   Humiliation, Afraid, Rape, and Kick questionnaire    Fear of Current or Ex-Partner: No    Emotionally Abused: No    Physically Abused: No    Sexually Abused: No    Medications:   Current Outpatient Medications on File Prior to Visit  Medication Sig Dispense Refill   acetaminophen (TYLENOL) 500 MG tablet Take 1,500 mg by mouth 2 (two) times daily as needed for mild pain (pain score 1-3), moderate pain (pain score 4-6), headache or fever.     albuterol (VENTOLIN HFA) 108 (90 Base) MCG/ACT inhaler Inhale 1-2 puffs into the lungs every 6 (six) hours as needed for wheezing or shortness of breath. (Patient taking differently: Inhale 1-2 puffs into the lungs every 3 (three) hours as needed for wheezing or shortness of breath.) 6.7 g 0   ALPRAZolam (XANAX) 1 MG tablet Take 1 mg by mouth in the morning and at bedtime.     atorvastatin (LIPITOR) 40 MG tablet Take 1 tablet (40 mg total) by mouth daily. 30 tablet 3   azelastine (ASTELIN) 0.1 % nasal spray Place 2 sprays into both nostrils 2 (two) times daily as needed for rhinitis or allergies.     Black Cohosh 540 MG CAPS Take 540 mg by mouth at bedtime.      buPROPion (WELLBUTRIN XL) 300 MG 24 hr tablet Take 300 mg by mouth daily.  celecoxib (CELEBREX) 200 MG capsule Take 200 mg by mouth daily.     clopidogrel (PLAVIX) 75 MG tablet Take 75 mg by mouth daily.     diltiazem (CARDIZEM CD) 120 MG 24 hr capsule Take 120 mg by mouth daily.     famotidine (PEPCID) 40 MG tablet Take 40 mg by mouth 2 (two) times daily.     fluticasone (FLONASE) 50 MCG/ACT nasal spray Place 2 sprays into both nostrils daily as needed for allergies or rhinitis.     gabapentin (NEURONTIN) 300 MG capsule Take 300 mg by mouth 3 (three) times daily.     magnesium oxide (MAG-OX) 400 (240 Mg) MG tablet Take 400 mg by mouth daily.     Multiple Vitamin (MULTI-VITAMIN) tablet Take 1 tablet by mouth daily with breakfast.     nebivolol (BYSTOLIC) 5 MG tablet Take 5 mg by mouth at bedtime.     pantoprazole (PROTONIX) 40 MG tablet Take 40 mg by mouth in the morning and at bedtime.     promethazine (PHENERGAN) 25 MG tablet Take 25 mg by mouth daily as needed for nausea or vomiting.     QUEtiapine Fumarate 150 MG TABS Take 150 mg by mouth at bedtime.     SUMAtriptan (IMITREX) 20 MG/ACT nasal spray Place 1 spray (20 mg total) into the nose as needed for migraine or headache. May repeat in 2 hours if headache persists or recurs. (Patient taking differently: Place 1 spray into the nose as needed for migraine or headache (May repeat in 2 hours if headache persists or recurs).) 1 each 2   tirzepatide (MOUNJARO) 7.5 MG/0.5ML Pen Inject 7.5 mg into the skin every Wednesday.     ustekinumab (STELARA) 90 MG/ML SOSY injection Inject 90 mg into the skin every 3 (three) months.     venlafaxine XR (EFFEXOR-XR) 150 MG 24 hr capsule Take 150 mg by mouth daily with breakfast.      levocetirizine (XYZAL) 5 MG tablet Take 5 mg by mouth every evening.     No current facility-administered medications on file prior to visit.    Allergies:   Allergies  Allergen Reactions   Haemophilus B Polysaccharide  Vaccine Swelling and Other (See Comments)    Redness at injection site (approximatly golf ball size) hot to the touch   Lisinopril Hives   Lisinopril-Hydrochlorothiazide Hives and Other (See Comments)    Welts, too   Wound Dressings Hives   Alpha-Gal Diarrhea, Nausea And Vomiting and Other (See Comments)    Blisters inside of mouth, too   Beef-Derived Drug Products Diarrhea, Nausea And Vomiting and Other (See Comments)    Alpha-gal   Influenza Vaccines     States her "arm had a knot and turned red"   Adhesive [Tape] Rash and Other (See Comments)    Also, blisters   Wound Dressing Adhesive Rash and Other (See Comments)    Blisters, too    Physical Exam Today's Vitals   04/24/23 1107  BP: 129/81  Pulse: 80  Weight: (!) 349 lb (158.3 kg)  Height: 5\' 11"  (1.803 m)   Body mass index is 48.68 kg/m.  General: Very pleasant middle-age Caucasian female, seated, in no evident distress Head: head normocephalic and atraumatic.  Neck: supple with no carotid or supraclavicular bruits Cardiovascular: regular rate and rhythm, no murmurs Musculoskeletal: no deformity.  Tenderness to palpation over right occiput.   Skin:  no rash/petichiae Vascular:  Normal pulses all extremities  Neurologic Exam Mental Status: Awake and fully  alert. Oriented to place and time. Recent memory subjectively impaired and remote memory intact. Attention span, concentration and fund of knowledge appropriate. Mood and affect appropriate.  Cranial Nerves: Pupils equal, briskly reactive to light. Extraocular movements full without nystagmus. Visual fields full to confrontation. Hearing intact. Facial sensation intact. Face, tongue, palate moves normally and symmetrically.  Motor: Normal bulk and tone. Normal strength in all tested extremity muscles. Sensory.: intact to touch ,pinprick .position and vibratory sensation.  Coordination: Rapid alternating movements normal in all extremities. Finger-to-nose and  heel-to-shin performed accurately bilaterally. Gait and Station: Arises from chair without difficulty. Stance is normal. Gait demonstrates normal stride length and balance without use of AD Reflexes: 1+ and symmetric. Toes downgoing.      04/24/2023   11:41 AM  Montreal Cognitive Assessment   Visuospatial/ Executive (0/5) 5  Naming (0/3) 3  Attention: Read list of digits (0/2) 2  Attention: Read list of letters (0/1) 1  Attention: Serial 7 subtraction starting at 100 (0/3) 1  Language: Repeat phrase (0/2) 1  Language : Fluency (0/1) 1  Abstraction (0/2) 2  Delayed Recall (0/5) 3  Orientation (0/6) 6  Total 25  Adjusted Score (based on education) 65        ASSESSMENT: 53 year old Caucasian lady with mixed migraine and tension headache with episode of complicated migraine in July 2023.  Vascular risk factors of obesity, mild hyperlipidemia and OSA with CPAP intolerance.  Complains of worsening headaches, about 10 headache days per month, radiates from right occiput to vertex, lasts short duration and resolves after sumatriptan.  She also complains of short-term memory difficulties     PLAN:   Headaches -Question possible right occipital neuralgia contributing, referral placed to headache wellness center for further evaluation. Does have chronic cervical pain previously followed by neurosurgery, consider scheduling follow-up visit as prior visit in 2023 -start baclofen 10 mg BID PRN  -Continue topiramate 50 mg twice daily for migraine prevention -hesitant to increase further d/t cognitive complaints -She is on multiple antidepressants so hesitant to add any additional medications.  She is currently on Bystolic and Cardizem.  She is currently on gabapentin 300 mg 3 times daily.  Can consider CRGP but will hold off until evaluation for occipital neuralgia -Continue sumatriptan for migraine rescue   Memory loss -MOCA 26/30  -Suspect multifactoral with underlying mood disorder,  polypharmacy and untreated sleep apnea -Patient is highly concerned of developing dementia, will refer to neuropsychology for further evaluation -Encourage establishing care with psychiatry as scheduled next month and follow-up with [facility] neurology for untreated sleep apnea d/t CPAP intolerance with plans on pursuing nocturnal polysomnogram and possible use of BiPAP therapy for better tolerance    Follow-up in 6 months or call earlier if needed     I spent 45 minutes of face-to-face and non-face-to-face time with patient.  This included previsit chart review, lab review, study review, order entry, electronic health record documentation, patient education and discussion regarding above diagnoses and treatment plan and answered all other questions to patient's satisfaction  Ihor Austin, [facility]  [facility] [address] [place], [place] [address]  Phone [phone] Fax [phone] Note: This document was prepared with digital dictation and possible smart phrase technology. Any transcriptional errors that result from this process are unintentional.

## 2023-04-24 ENCOUNTER — Encounter: Payer: Self-pay | Admitting: Adult Health

## 2023-04-24 ENCOUNTER — Ambulatory Visit (INDEPENDENT_AMBULATORY_CARE_PROVIDER_SITE_OTHER): Payer: Medicare HMO | Admitting: Adult Health

## 2023-04-24 VITALS — BP 129/81 | HR 80 | Ht 71.0 in | Wt 349.0 lb

## 2023-04-24 DIAGNOSIS — G44209 Tension-type headache, unspecified, not intractable: Secondary | ICD-10-CM

## 2023-04-24 DIAGNOSIS — M5481 Occipital neuralgia: Secondary | ICD-10-CM | POA: Diagnosis not present

## 2023-04-24 DIAGNOSIS — G43909 Migraine, unspecified, not intractable, without status migrainosus: Secondary | ICD-10-CM

## 2023-04-24 DIAGNOSIS — R413 Other amnesia: Secondary | ICD-10-CM | POA: Diagnosis not present

## 2023-04-24 MED ORDER — BACLOFEN 10 MG PO TABS: 10.0000 mg | ORAL_TABLET | Freq: Two times a day (BID) | ORAL | 5 refills | Status: DC | PRN

## 2023-04-24 MED ORDER — TOPIRAMATE 50 MG PO TABS: 50.0000 mg | ORAL_TABLET | Freq: Two times a day (BID) | ORAL | 3 refills | Status: DC

## 2023-04-24 NOTE — Patient Instructions (Addendum)
Your Plan:  Continue topamax 50mg  twice daily  Recommend use of baclofen as needed   Referral placed to headache center Dr. Neale Burly for possible occipital neuralgia   Referral placed for neurocognitive evaluation for memory concerns       Follow up in 6 months or call earlier if needed      Thank you for coming to see [place] at [facility]. I hope we have been able to provide you high quality care today.  You may receive a patient satisfaction survey over the next few weeks. We would appreciate your feedback and comments so that we may continue to improve ourselves and the health of our patients.

## 2023-04-27 ENCOUNTER — Telehealth: Payer: Self-pay | Admitting: Adult Health

## 2023-04-27 NOTE — Telephone Encounter (Signed)
Referral for neuropsychology fax to [place]. Phone:[phone], Fax: [phone]

## 2023-04-27 NOTE — Telephone Encounter (Signed)
Referral for headache clinic fax to [place]. Phone: [phone], Fax: [phone]

## 2023-05-19 ENCOUNTER — Other Ambulatory Visit (HOSPITAL_BASED_OUTPATIENT_CLINIC_OR_DEPARTMENT_OTHER): Payer: Self-pay

## 2023-05-20 ENCOUNTER — Other Ambulatory Visit (HOSPITAL_BASED_OUTPATIENT_CLINIC_OR_DEPARTMENT_OTHER): Payer: Self-pay

## 2023-05-20 MED ORDER — MOUNJARO 7.5 MG/0.5ML ~~LOC~~ SOAJ
7.5000 mg | SUBCUTANEOUS | 0 refills | Status: DC
  Filled 2023-05-20: qty 2, 28d supply, fill #0

## 2023-05-22 ENCOUNTER — Institutional Professional Consult (permissible substitution) (INDEPENDENT_AMBULATORY_CARE_PROVIDER_SITE_OTHER): Payer: Medicare HMO | Admitting: Thoracic Surgery (Cardiothoracic Vascular Surgery)

## 2023-05-22 ENCOUNTER — Encounter: Payer: Self-pay | Admitting: Thoracic Surgery (Cardiothoracic Vascular Surgery)

## 2023-05-22 VITALS — BP 115/77 | HR 92 | Resp 20 | Ht 71.0 in | Wt 348.6 lb

## 2023-05-22 DIAGNOSIS — K449 Diaphragmatic hernia without obstruction or gangrene: Secondary | ICD-10-CM | POA: Insufficient documentation

## 2023-05-22 NOTE — Progress Notes (Signed)
[address]       [place] [address]             [phone]                    [name] [facility] Medical Record #[id number] Date of Birth: 12-29-1970  Referring: Zachery Dakins, MD Primary Care: Rick Duff, [place] Primary Cardiologist: Rollene Rotunda, MD  Chief Complaint:    Chief Complaint  Patient presents with   Hiatal Hernia    New patient consultation, chest CT 2/4    History of Present Illness:    [name] 53 y.o. female presents for surgical evaluation of a multiple time recurrent large hiatal hernia.  Her first operation was in 2006 and was done in conjunction with the Lap-Band placement.  The Lap-Band ultimately was removed.  She has since had several other hiatal failed repairs.  She is also had multiple wound complications from other surgeries, including a panniculectomy.  She currently is very symptomatic with dysphagia and occasional odynophagia.  She has also had multiple recurrent pneumonias likely due to aspiration.  She has been evaluated by our general surgery colleagues, and was referred to [facility] thoracic surgery, who also referred her for evaluation by bariatric surgery.      Zubrod Score: At the time of surgery this patient's most appropriate activity status/level should be described as: []     0    Normal activity, no symptoms [x]     1    Restricted in physical strenuous activity but ambulatory, able to do out light work []     2    Ambulatory and capable of self care, unable to do work activities, up and about               >50 % of waking hours                              []     3    Only limited self care, in bed greater than 50% of waking hours []     4    Completely disabled, no self care, confined to bed or chair []     5    Moribund   Past Medical History:  Diagnosis Date   Anxiety    Arthritis    joints   Asthma    Endometriosis    Fat necrosis of breast    right   GERD (gastroesophageal reflux  disease)    History of hiatal hernia    s/p repair few times   History of suicide attempt 03/2017   Hypertension    MDD (major depressive disorder)    Panniculitis    Pneumonia 10/2018   in hospital with pneumonia   PONV (postoperative nausea and vomiting)    Psoriasis    treated with Humeria    Past Surgical History:  Procedure Laterality Date   ACHILLES TENDON REPAIR Left 01-11-2015  @[facility]    BREAST CYST EXCISION Right 01/24/2019   Procedure: EXCISIN OF RIGHT BREAST FAT NECROSIS;  Surgeon: Peggye Form, DO;  Location: [facility];  Service: Plastics;  Laterality: Right;   BREAST REDUCTION SURGERY Bilateral 01/09/2016   Procedure: MAMMARY REDUCTION  (BREAST) BILATERAL;  Surgeon: Peggye Form, DO;  Location: [facility];  Service: Plastics;  Laterality: Bilateral;   CARPOMETACARPAL (CMC) FUSION OF THUMB Right 05-04-2015   @[facility]   COMBINED HYSTEROSCOPY DIAGNOSTIC / D&C  06-23-2003     dr Ambrose Mantle @[facility]    D & C HYSTERSCOPY /  DX LAPAROSCOPY CONVERSION LAPAROTOMY LYSIS ADHESIONS WITH RIGHT SALPINECTOMY  12-26-2003    dr Edward Jolly  @[facility]    DEBRIDEMENT AND CLOSURE WOUND N/A 02/07/2019   Procedure: DEBRIDEMENT OF ABDOMINAL WOUND, PLACEMENT OF A-CELL, PLACEMENT OF WOUND VAC;  Surgeon: Peggye Form, DO;  Location: [facility];  Service: Plastics;  Laterality: N/A;   INCISION AND DRAINAGE OF WOUND Right 06/22/2019   Procedure: Excision of abdominal wound with closure;  Surgeon: Peggye Form, DO;  Location: [facility];  Service: Plastics;  Laterality: Right;  Total case time is 1 hour   LAPAROSCOPIC CHOLECYSTECTOMY  10-19-2002   dr Orson Slick  @[facility]    LAPAROSCOPIC GASTRIC BANDING  09-25-2003  @[facility]    WITH HIATAL HERNIA REPAIR   LAPAROSCOPIC LYSIS OF ADHESIONS N/A 01/24/2019   Procedure: LAPAROSCOPY WITH LYSIS OF ADHESIONS;  Surgeon: Luretha Murphy, MD;  Location: [facility];  Service: General;  Laterality: N/A;   LAPAROSCOPIC REPAIR AND REMOVAL OF GASTRIC BAND   02-06-2006  @[facility]    LAPAROTOMY LYSIS ADHESIONS , UTERINE BIOPSY  01-31-2000   dr lowe  @[facility]    OPEN WOUND DEBRIDEMENT ANTERIOR ABDOMINAL WALL , REMOVAL FORGEIN BODY'S  05-10-2001   dr Orson Slick  @[facility]    PANNICULECTOMY N/A 01/24/2019   Procedure: PANNICULECTOMY;  Surgeon: Peggye Form, DO;  Location: [facility];  Service: Plastics;  Laterality: N/A;   REPAIR RECURRENT  HIATAL HERNIA   04-23-2004  @[facility]    TAKEDOWN RECURRENT HIATAL HERNIA FROM PREVIOUS MESH REPAIR OF DIAPHRAGM/ REPAIR HIATAL HERNIA AND THE GASTROPEXY  11/05/2004   TONSILLECTOMY  age 31   TOTAL ABDOMINAL HYSTERECTOMY W/ BILATERAL SALPINGOOPHORECTOMY  12-23-2005   dr soper   W/  BILATERAL URETEROLYSIS AND EXTENSIVE ABD. LYSIS ADHESIONS   UMBILICAL HERNIA REPAIR  12/27/2009   LAPAROSCOPY TAKEDOWN INCARCERATED UMBILICAL HERNIA WITH REPAIR/ OPEN REPAIR COMPLEX LOWER INCISIONAL HERNIA   VAGOTOMY  2006    Family History  Problem Relation Age of Onset   Cancer Maternal Aunt        melanoma   Diabetes Father    Heart attack Father 12     Social History   Tobacco Use  Smoking Status Every Day   Current packs/day: 0.00   Average packs/day: 1 pack/day for 20.0 years (20.0 ttl pk-yrs)   Types: Cigarettes   Start date: 05/20/1995   Last attempt to quit: 05/20/2015   Years since quitting: 8.0  Smokeless Tobacco Never  Tobacco Comments   < 1 pack    Social History   Substance and Sexual Activity  Alcohol Use Yes   Comment: socially     Allergies  Allergen Reactions   Haemophilus B Polysaccharide Vaccine Swelling and Other (See Comments)    Redness at injection site (approximatly golf ball size) hot to the touch   Lisinopril Hives   Lisinopril-Hydrochlorothiazide Hives and Other (See Comments)    Welts, too   Wound Dressings Hives   Alpha-Gal Diarrhea, Nausea And Vomiting and Other (See Comments)    Blisters inside of mouth, too   Beef-Derived Drug Products Diarrhea, Nausea And Vomiting and Other (See Comments)     Alpha-gal   Influenza Vaccines     States her "arm had a knot and turned red"   Adhesive [Tape] Rash and Other (See Comments)    Also, blisters   Wound Dressing Adhesive Rash and Other (See Comments)  Blisters, too    Current Outpatient Medications  Medication Sig Dispense Refill   acetaminophen (TYLENOL) 500 MG tablet Take 1,500 mg by mouth 2 (two) times daily as needed for mild pain (pain score 1-3), moderate pain (pain score 4-6), headache or fever.     albuterol (VENTOLIN HFA) 108 (90 Base) MCG/ACT inhaler Inhale 1-2 puffs into the lungs every 6 (six) hours as needed for wheezing or shortness of breath. (Patient taking differently: Inhale 1-2 puffs into the lungs every 3 (three) hours as needed for wheezing or shortness of breath.) 6.7 g 0   ALPRAZolam (XANAX) 1 MG tablet Take 1 mg by mouth in the morning and at bedtime.     atorvastatin (LIPITOR) 40 MG tablet Take 1 tablet (40 mg total) by mouth daily. 30 tablet 3   azelastine (ASTELIN) 0.1 % nasal spray Place 2 sprays into both nostrils 2 (two) times daily as needed for rhinitis or allergies.     baclofen (LIORESAL) 10 MG tablet Take 1 tablet (10 mg total) by mouth 2 (two) times daily as needed (migraine). 60 tablet 5   Black Cohosh 540 MG CAPS Take 540 mg by mouth at bedtime.     celecoxib (CELEBREX) 200 MG capsule Take 200 mg by mouth daily.     clopidogrel (PLAVIX) 75 MG tablet Take 75 mg by mouth daily.     diltiazem (CARDIZEM CD) 120 MG 24 hr capsule Take 120 mg by mouth daily.     famotidine (PEPCID) 40 MG tablet Take 40 mg by mouth 2 (two) times daily.     fluticasone (FLONASE) 50 MCG/ACT nasal spray Place 2 sprays into both nostrils daily as needed for allergies or rhinitis.     gabapentin (NEURONTIN) 300 MG capsule Take 300 mg by mouth 3 (three) times daily.     magnesium oxide (MAG-OX) 400 (240 Mg) MG tablet Take 400 mg by mouth daily.     Multiple Vitamin (MULTI-VITAMIN) tablet Take 1 tablet by mouth daily with breakfast.      nebivolol (BYSTOLIC) 5 MG tablet Take 5 mg by mouth at bedtime.     pantoprazole (PROTONIX) 40 MG tablet Take 40 mg by mouth in the morning and at bedtime.     promethazine (PHENERGAN) 25 MG tablet Take 25 mg by mouth daily as needed for nausea or vomiting.     QUEtiapine Fumarate 150 MG TABS Take 150 mg by mouth at bedtime.     SUMAtriptan (IMITREX) 20 MG/ACT nasal spray Place 1 spray (20 mg total) into the nose as needed for migraine or headache. May repeat in 2 hours if headache persists or recurs. (Patient taking differently: Place 1 spray into the nose as needed for migraine or headache (May repeat in 2 hours if headache persists or recurs).) 1 each 2   tirzepatide (MOUNJARO) 7.5 MG/0.5ML Pen Inject 7.5 mg into the skin every Wednesday.     topiramate (TOPAMAX) 50 MG tablet Take 1 tablet (50 mg total) by mouth 2 (two) times daily. 180 tablet 3   ustekinumab (STELARA) 90 MG/ML SOSY injection Inject 90 mg into the skin every 3 (three) months.     venlafaxine XR (EFFEXOR-XR) 150 MG 24 hr capsule Take 150 mg by mouth daily with breakfast.      levocetirizine (XYZAL) 5 MG tablet Take 5 mg by mouth every evening.     No current facility-administered medications for this visit.    Review of Systems  Constitutional:  Positive for malaise/fatigue. Negative for  weight loss.  Respiratory:  Positive for cough.   Gastrointestinal:  Positive for abdominal pain, heartburn, nausea and vomiting.  Neurological: Negative.   Psychiatric/Behavioral:  Positive for depression and suicidal ideas.      PHYSICAL EXAMINATION: BP 115/77 (BP Location: Right Arm, Patient Position: Sitting, Cuff Size: Large)   Pulse 92   Resp 20   Ht 5\' 11"  (1.803 m)   Wt (!) 348 lb 9.6 oz (158.1 kg)   SpO2 95% Comment: RA  BMI 48.62 kg/m  Physical Exam Constitutional:      General: She is not in acute distress.    Appearance: She is not ill-appearing.  HENT:     Head: Normocephalic and atraumatic.  Eyes:      Extraocular Movements: Extraocular movements intact.  Cardiovascular:     Rate and Rhythm: Normal rate.  Pulmonary:     Effort: Pulmonary effort is normal. No respiratory distress.  Musculoskeletal:        General: Normal range of motion.  Skin:    General: Skin is warm and dry.  Neurological:     General: No focal deficit present.     Mental Status: She is alert and oriented to person, place, and time.     Diagnostic Studies & Laboratory data:    CT Scan: CT scan from [facility] was reviewed.  This was also compared to her scan from 2020.  She does have a large hiatal hernia with intrathoracic stomach and fat.      I have independently reviewed the above radiology studies  and reviewed the findings with the patient.   Recent Lab Findings: Lab Results  Component Value Date   WBC 11.0 (H) 03/06/2023   HGB 12.1 03/06/2023   HCT 39.8 03/06/2023   PLT 209 03/06/2023   GLUCOSE 102 (H) 03/06/2023   CHOL 149 11/03/2021   TRIG 134 11/03/2021   HDL 44 11/03/2021   LDLCALC 78 11/03/2021   ALT 22 03/04/2023   AST 27 03/04/2023   NA 140 03/06/2023   K 3.6 03/06/2023   CL 109 03/06/2023   CREATININE 0.85 03/06/2023   BUN 18 03/06/2023   CO2 23 03/06/2023   TSH 1.750 10/18/2018   INR 1.1 10/03/2022   HGBA1C 6.0 (H) 11/03/2021        Assessment / Plan:   53 year old female with a large recurrent hiatal hernia.  She has had multiple hiatal operations which have all failed.  She is also morbidly obese with a BMI of 48.  She is also had wound complications from other surgeries.  I will discuss her case with our general surgery colleagues as well as our plastic surgeons.  Given the number of operations that she has had in the past I do not think that a minimally invasive approach would be possible.  We could potentially consider mobilizing the stomach thoracoscopically and potentially performing a bariatric procedure along with the hiatal hernia repair.  I will require the support of  general surgery and potentially plastic surgery to assist with this.  If this is not possible then she will need to be referred back to [facility], or [facility].     I  spent 60 minutes with the patient face to face counseling and coordination of care.    Corliss Skains 05/22/2023 11:58 AM

## 2023-05-23 ENCOUNTER — Other Ambulatory Visit: Payer: Self-pay | Admitting: Neurology

## 2023-05-25 NOTE — Telephone Encounter (Signed)
Last seen on 04/24/23 No follow up scheduled

## 2023-05-27 ENCOUNTER — Institutional Professional Consult (permissible substitution): Payer: Medicare HMO | Admitting: Pulmonary Disease

## 2023-05-29 ENCOUNTER — Ambulatory Visit: Payer: Medicare HMO | Admitting: Thoracic Surgery (Cardiothoracic Vascular Surgery)

## 2023-05-29 DIAGNOSIS — K449 Diaphragmatic hernia without obstruction or gangrene: Secondary | ICD-10-CM | POA: Diagnosis not present

## 2023-05-29 NOTE — Progress Notes (Signed)
     [address]       [place] [address]             [phone]       Patient: Home Provider: Office Consent for Telemedicine visit obtained.  Today's visit was completed via a real-time telehealth (see specific modality noted below). The patient/authorized person provided oral consent at the time of the visit to engage in a telemedicine encounter with the present provider at [facility]. The patient/authorized person was informed of the potential benefits, limitations, and risks of telemedicine. The patient/authorized person expressed understanding that the laws that protect confidentiality also apply to telemedicine. The patient/authorized person acknowledged understanding that telemedicine does not provide emergency services and that he or she would need to call 911 or proceed to the nearest hospital for help if such a need arose.   Total time spent in the clinical discussion 10 minutes.  Telehealth Modality: Phone visit (audio only)  I had a telephone visit with Mrs. Schultes.  She has met with general surgery and they are open to performing a bariatric procedure after she goes through the pre-op screening.  I will touch base with her in 2 months to coordinate care.  Karthik Whittinghill Keane Scrape

## 2023-06-03 ENCOUNTER — Other Ambulatory Visit: Payer: Self-pay | Admitting: Neurology

## 2023-06-03 NOTE — Telephone Encounter (Signed)
 Last seen on 04/24/23 Follow 6 month follow up scheduled

## 2023-06-16 ENCOUNTER — Other Ambulatory Visit (HOSPITAL_BASED_OUTPATIENT_CLINIC_OR_DEPARTMENT_OTHER): Payer: Self-pay

## 2023-06-17 ENCOUNTER — Encounter (HOSPITAL_BASED_OUTPATIENT_CLINIC_OR_DEPARTMENT_OTHER): Payer: Self-pay

## 2023-06-17 ENCOUNTER — Other Ambulatory Visit (HOSPITAL_BASED_OUTPATIENT_CLINIC_OR_DEPARTMENT_OTHER): Payer: Self-pay

## 2023-06-17 MED ORDER — MOUNJARO 7.5 MG/0.5ML ~~LOC~~ SOAJ
7.5000 mg | SUBCUTANEOUS | 0 refills | Status: DC
  Filled 2023-06-17: qty 6, 84d supply, fill #0

## 2023-07-02 ENCOUNTER — Encounter: Attending: General Surgery | Admitting: Dietician

## 2023-07-02 ENCOUNTER — Encounter: Payer: Self-pay | Admitting: Dietician

## 2023-07-02 VITALS — Ht 71.0 in | Wt 347.6 lb

## 2023-07-02 DIAGNOSIS — Z713 Dietary counseling and surveillance: Secondary | ICD-10-CM | POA: Diagnosis not present

## 2023-07-02 DIAGNOSIS — E669 Obesity, unspecified: Secondary | ICD-10-CM | POA: Insufficient documentation

## 2023-07-02 DIAGNOSIS — Z6841 Body Mass Index (BMI) 40.0 and over, adult: Secondary | ICD-10-CM | POA: Insufficient documentation

## 2023-07-02 NOTE — Progress Notes (Signed)
 Nutrition Assessment for Bariatric Surgery: Pre-Surgery Behavioral and Nutrition Intervention Program   Medical Nutrition Therapy  Appt Start Time: 1415    End Time: 1519  Patient was seen on 07/02/2023 for Pre-Operative Nutrition Assessment. Purpose of todays visit  enhance perioperative outcomes along with a healthy weight maintenance   Referral stated Supervised Weight Loss (SWL) visits needed: 6 months  Planned surgery: RYGB Pt expectation of surgery: to be about 180-200; relief from hiatal hernia  NUTRITION ASSESSMENT   Anthropometrics  Start weight at [facility]: 347.6 lbs (date: 07/02/2023)  Height: 71 in BMI: 48.48 kg/m2     Clinical   Pharmacotherapy: History of weight loss medication used: phentermine, Mounjaro  Medical hx: obesity, stroke, asthma, HTN, T2DM, GERD, hiatal hernia, gastric band placement and removal, barrett's esophagus, Medications: see list  Labs: 03/05/2023: glucose 100, potassium 3.4, calcium 8.2 Notable signs/symptoms: none noted Any previous deficiencies? No  Evaluation of Nutritional Deficiencies: Micronutrient Nutrition Focused Physical Exam: Hair: No issues observed Eyes: No issues observed Mouth: No issues observed Neck: No issues observed Nails: No issues observed Skin: No issues observed  Lifestyle & Dietary Hx  Pt states she is allergic to beef (alpha-gal). Pt states she developed it from a bite from a tick. Pt states she takes biotin, magnesium, folic acid, black choha for hot flashes, multivitamin. Pt states she had a hysterectomy, stating she does not menstruate, and is not at risk for low iron. Pt states she has existed for the past 3 months on greek yogurt, Kind peanut butter yogurt and sometimes blueberries. Pt states she thinks she is scared to throw up (vomit), stating she thinks it is psychological.  Current Physical Activity Recommendations state 150 minutes per week of moderate to vigorous movement including Cardio and 1-2 days  of resistance activities as well as flexibility/balance activities:  Pts current physical activity: increased to 3 days per week for 45 minutes, with 85% recommendation reached   Sleep Hygiene: duration and quality: better, stating since increasing the quetiapine fumarate to 300 mg  Current Patient Perceived Stress Level as stated by pt on a scale of 1-10:  3       Stress Management Techniques: focus on self  According to the Dietary Guidelines for Americans Recommendation: equivalent 1.5-2 cups fruits per day, equivalent 2-3 cups vegetables per day and at least half all grains whole  Fruit servings per day (on average): 0-1, meeting 0-66% recommendation  Non-starchy vegetable servings per day (on average): 0, meeting 0% recommendation  Whole Grains per day (on average): 2  Number of meals missed/skipped per week out of 21: 14  24-Hr Dietary Recall First Meal: skip Snack: Seeq Protein powder, mixed with water or Orgain pea (plant based) protein with 2% lactaid milk with coffee and ice(iced coffee) Second Meal: skip Snack: Seeq Protien powder mixed with water Third Meal: greek yogurt, with Kind peanut butter granola, with blueberries sometimes. Snack: Ritz crackers with cheese Beverages: diet green tea, water  Alcoholic beverages per week: 0   Estimated Energy Needs Calories: 1500  NUTRITION DIAGNOSIS  Overweight/obesity ([place]-3.3) related to past poor dietary habits and physical inactivity as evidenced by patient w/ planned RYGB surgery following dietary guidelines for continued weight loss.    NUTRITION INTERVENTION  Nutrition counseling (C-1) and education (E-2) to facilitate bariatric surgery goals.  Educated pt on micronutrient deficiencies post-surgery and behavioral/dietary strategies to start in order to mitigate that risk   Behavioral and Dietary Interventions Pre-Op Goals Reviewed with the Patient Nutrition: Healthy  Eating Behaviors Switch to non-caloric, non-carbonated  and non-caffeinated beverages such as  water, unsweetened tea, Crystal Light and zero calorie beverages (aim for 64 oz. per day) Cut out grazing between meals or at night  Find a protein shake you like Eat every 3-5 hours        Eliminate distractions while eating (TV, computer, reading, driving, texting) Take 16-10 minutes to eat a meal  Decrease high sugar foods/decrease high fat/fried foods Eliminate alcoholic beverages Increase protein intake (eggs, fish, chicken, yogurt) before surgery Eat non starchy vegetables 2 times a day 7 days a week Eat complex carbohydrates such as whole grains and fruits   Behavioral Modification: Physical Activity Increase my usual daily activity (use stairs, park farther, etc.) Engage in _______________________  activity  _______ minutes ______ times per week  Other:    _________________________________________________________________     Problem Solving I will think about my usual eating patterns and how to tweak them How can my friends and family support me Barriers to starting my changes Learn and understand appetite verses hunger   Healthy Coping Allow for ___________ activities per week to help me manage stress Reframe negative thoughts I will keep a picture of someone or something that is my inspiration & look at it daily   Monitoring  Weigh myself once a week  Measure my progress by monitoring how my clothes fit Keep a food record of what I eat and drink for the next ________ (time period) Take pictures of what I eat and drink for the next ________ (time period) Use an app to count steps/day for the next_______ (time period) Measure my progress such as increased energy and more restful sleep Monitor your acid reflux and bowel habits, are they getting better?   *Goals that are bolded indicate the pt would like to start working towards these  Handouts Provided Include  Bariatric Surgery handouts (Nutrition Visits, Pre Surgery Behavioral  Change Goals, Protein Shakes Brands to Choose From, Vitamins & Mineral Supplementation)  Learning Style & Readiness for Change Teaching method utilized: Visual, Auditory, and hands on  Demonstrated degree of understanding via: Teach Back  Readiness Level: preparation Barriers to learning/adherence to lifestyle change: fear of emesis  RD's Notes for Next Visit Patient progress toward chosen goals.    MONITORING & EVALUATION Dietary intake, weekly physical activity, body weight, and preoperative behavioral change goals   Next Steps  Patient is to follow up at [facility] in two weeks for first SWL visit.

## 2023-07-04 ENCOUNTER — Emergency Department (HOSPITAL_BASED_OUTPATIENT_CLINIC_OR_DEPARTMENT_OTHER)

## 2023-07-04 ENCOUNTER — Other Ambulatory Visit: Payer: Self-pay

## 2023-07-04 ENCOUNTER — Emergency Department (HOSPITAL_BASED_OUTPATIENT_CLINIC_OR_DEPARTMENT_OTHER): Admitting: Radiology

## 2023-07-04 ENCOUNTER — Emergency Department (HOSPITAL_BASED_OUTPATIENT_CLINIC_OR_DEPARTMENT_OTHER)
Admission: EM | Admit: 2023-07-04 | Discharge: 2023-07-04 | Disposition: A | Discharge status: Home / Self Care | Attending: Emergency Medicine | Admitting: Emergency Medicine

## 2023-07-04 DIAGNOSIS — J40 Bronchitis, not specified as acute or chronic: Secondary | ICD-10-CM | POA: Diagnosis not present

## 2023-07-04 DIAGNOSIS — R509 Fever, unspecified: Secondary | ICD-10-CM | POA: Diagnosis present

## 2023-07-04 DIAGNOSIS — J690 Pneumonitis due to inhalation of food and vomit: Secondary | ICD-10-CM | POA: Diagnosis not present

## 2023-07-04 DIAGNOSIS — D72829 Elevated white blood cell count, unspecified: Secondary | ICD-10-CM | POA: Diagnosis not present

## 2023-07-04 DIAGNOSIS — R059 Cough, unspecified: Secondary | ICD-10-CM | POA: Diagnosis present

## 2023-07-04 DIAGNOSIS — Z7901 Long term (current) use of anticoagulants: Secondary | ICD-10-CM | POA: Insufficient documentation

## 2023-07-04 DIAGNOSIS — J45909 Unspecified asthma, uncomplicated: Secondary | ICD-10-CM | POA: Insufficient documentation

## 2023-07-04 LAB — CBC WITH DIFFERENTIAL/PLATELET
Abs Immature Granulocytes: 0.07 10*3/uL (ref 0.00–0.07)
Basophils Absolute: 0 10*3/uL (ref 0.0–0.1)
Basophils Relative: 0 %
Eosinophils Absolute: 0.2 10*3/uL (ref 0.0–0.5)
Eosinophils Relative: 1 %
HCT: 39.5 % (ref 36.0–46.0)
Hemoglobin: 12.6 g/dL (ref 12.0–15.0)
Immature Granulocytes: 1 %
Lymphocytes Relative: 19 %
Lymphs Abs: 3 10*3/uL (ref 0.7–4.0)
MCH: 29 pg (ref 26.0–34.0)
MCHC: 31.9 g/dL (ref 30.0–36.0)
MCV: 90.8 fL (ref 80.0–100.0)
Monocytes Absolute: 1 10*3/uL (ref 0.1–1.0)
Monocytes Relative: 6 %
Neutro Abs: 11 10*3/uL — ABNORMAL HIGH (ref 1.7–7.7)
Neutrophils Relative %: 73 %
Platelets: 231 10*3/uL (ref 150–400)
RBC: 4.35 MIL/uL (ref 3.87–5.11)
RDW: 15.2 % (ref 11.5–15.5)
WBC: 15.2 10*3/uL — ABNORMAL HIGH (ref 4.0–10.5)
nRBC: 0 % (ref 0.0–0.2)

## 2023-07-04 LAB — BASIC METABOLIC PANEL WITH GFR
Anion gap: 8 (ref 5–15)
BUN: 24 mg/dL — ABNORMAL HIGH (ref 6–20)
CO2: 24 mmol/L (ref 22–32)
Calcium: 9.1 mg/dL (ref 8.9–10.3)
Chloride: 108 mmol/L (ref 98–111)
Creatinine, Ser: 0.72 mg/dL (ref 0.44–1.00)
GFR, Estimated: >60 mL/min (ref >60)
Glucose, Bld: 91 mg/dL (ref 70–99)
Potassium: 3.6 mmol/L (ref 3.5–5.1)
Sodium: 140 mmol/L (ref 135–145)

## 2023-07-04 LAB — RESP PANEL BY RT-PCR (RSV, FLU A&B, COVID)  RVPGX2
Influenza A by PCR: NEGATIVE
Influenza B by PCR: NEGATIVE
Resp Syncytial Virus by PCR: NEGATIVE
SARS Coronavirus 2 by RT PCR: NEGATIVE

## 2023-07-04 MED ORDER — IPRATROPIUM-ALBUTEROL 0.5-2.5 (3) MG/3ML IN SOLN
3.0000 mL | Freq: Once | RESPIRATORY_TRACT | Status: AC
  Administered 2023-07-04: 3 mL via RESPIRATORY_TRACT
  Filled 2023-07-04: qty 3

## 2023-07-04 MED ORDER — BENZONATATE 100 MG PO CAPS
100.0000 mg | ORAL_CAPSULE | Freq: Three times a day (TID) | ORAL | 0 refills | Status: AC
Start: 2023-07-04 — End: ?

## 2023-07-04 MED ORDER — METHYLPREDNISOLONE SODIUM SUCC 125 MG IJ SOLR
125.0000 mg | Freq: Once | INTRAMUSCULAR | Status: AC
Start: 2023-07-04 — End: 2023-07-04
  Administered 2023-07-04: 125 mg via INTRAVENOUS
  Filled 2023-07-04: qty 2

## 2023-07-04 MED ORDER — MAGNESIUM SULFATE 2 GM/50ML IV SOLN
2.0000 g | Freq: Once | INTRAVENOUS | Status: AC
  Administered 2023-07-04: 2 g via INTRAVENOUS
  Filled 2023-07-04: qty 50

## 2023-07-04 MED ORDER — IOHEXOL 350 MG/ML SOLN
75.0000 mL | Freq: Once | INTRAVENOUS | Status: AC | PRN
  Administered 2023-07-04: 75 mL via INTRAVENOUS

## 2023-07-04 NOTE — Discharge Instructions (Addendum)
 Please follow-up with your primary care provider regards recent ER visit.  Today your labs and imaging ultimately reassuring however the CT scan does show nodules in the right lung that we will need a repeat CT scan done through your primary care provider in 6 to 12 months.  I have prescribed Tessalon to help with your cough.  If symptoms change or worsen please return to the ER.

## 2023-07-04 NOTE — ED Notes (Signed)
 Ambulated pt to bathroom while on pulse ox. Pt noted to be SOB. O2 remained above 95% on room air.

## 2023-07-04 NOTE — ED Provider Notes (Signed)
  EMERGENCY DEPARTMENT AT [facility] Provider Note   CSN: [id number] Arrival date & time: 07/04/23  1445     History  Chief Complaint  Patient presents with   Cough    [name] is a 53 y.o. female history of GERD, hiatal hernia, asthma, aspiration pneumonia which she was hospitalized at the end of 2024 presented for 3 weeks of green sputum with a deep cough.  Patient has chest pain but states she feels short of breath.  Patient is used her albuterol inhaler and nebulizers no relief.  Patient denies any wheezing.  Patient dates yesterday she had a fever of 100 F.  Patient was on Levaquin earlier this month however states that her symptoms have persisted.    Home Medications Prior to Admission medications   Medication Sig Start Date End Date Taking? Authorizing Provider  benzonatate (TESSALON) 100 MG capsule Take 1 capsule (100 mg total) by mouth every 8 (eight) hours. 07/04/23  Yes Valera Vallas, Beverly Gust, PA-C  acetaminophen (TYLENOL) 500 MG tablet Take 1,500 mg by mouth 2 (two) times daily as needed for mild pain (pain score 1-3), moderate pain (pain score 4-6), headache or fever.    [provider]  albuterol (VENTOLIN HFA) 108 (90 Base) MCG/ACT inhaler Inhale 1-2 puffs into the lungs every 6 (six) hours as needed for wheezing or shortness of breath. Patient taking differently: Inhale 1-2 puffs into the lungs every 3 (three) hours as needed for wheezing or shortness of breath. 04/15/21   Long, Arlyss Repress, MD  ALPRAZolam Prudy Feeler) 1 MG tablet Take 1 mg by mouth in the morning and at bedtime.    [provider]  atorvastatin (LIPITOR) 40 MG tablet Take 1 tablet (40 mg total) by mouth daily. 11/04/21   Marinda Elk, MD  azelastine (ASTELIN) 0.1 % nasal spray Place 2 sprays into both nostrils 2 (two) times daily as needed for rhinitis or allergies. 11/13/21   [provider]  baclofen (LIORESAL) 10 MG tablet Take 1 tablet (10 mg total) by mouth  2 (two) times daily as needed (migraine). 04/24/23   Ihor Austin, NP  Black Cohosh 540 MG CAPS Take 540 mg by mouth at bedtime.    [provider]  celecoxib (CELEBREX) 200 MG capsule Take 200 mg by mouth daily. 06/09/22   [provider]  clopidogrel (PLAVIX) 75 MG tablet Take 75 mg by mouth daily. 04/17/22   [provider]  diltiazem (CARDIZEM CD) 120 MG 24 hr capsule Take 120 mg by mouth daily. 07/21/21   [provider]  famotidine (PEPCID) 40 MG tablet Take 40 mg by mouth 2 (two) times daily.    [provider]  fluticasone (FLONASE) 50 MCG/ACT nasal spray Place 2 sprays into both nostrils daily as needed for allergies or rhinitis.    [provider]  gabapentin (NEURONTIN) 300 MG capsule Take 300 mg by mouth 3 (three) times daily. 01/22/19   [provider]  levocetirizine (XYZAL) 5 MG tablet Take 5 mg by mouth every evening. 09/04/22 03/04/23  [provider]  magnesium oxide (MAG-OX) 400 (240 Mg) MG tablet Take 400 mg by mouth daily.    [provider]  Multiple Vitamin (MULTI-VITAMIN) tablet Take 1 tablet by mouth daily with breakfast.    [provider]  nebivolol (BYSTOLIC) 5 MG tablet Take 5 mg by mouth at bedtime.    [provider]  pantoprazole (PROTONIX) 40 MG tablet Take 40 mg by mouth  in the morning and at bedtime.    [provider]  promethazine (PHENERGAN) 25 MG tablet Take 25 mg by mouth daily as needed for nausea or vomiting. 09/28/20   [provider]  QUEtiapine Fumarate 150 MG TABS Take 150 mg by mouth at bedtime. 09/04/22   [provider]  SUMAtriptan (IMITREX) 20 MG/ACT nasal spray PLACE 1 SPRAY INTO THE NOSE AS NEEDED FOR MIGRAINE OR HEADACHE. MAY REPEAT IN 2 HOURS IF HEADACHE PERSISTS OR RECURS. 06/03/23   Ihor Austin, NP  tirzepatide [facility]) 7.5 MG/0.5ML Pen Inject 7.5 mg into the skin every Wednesday.    [provider]  tirzepatide  Greggory Keen) 7.5 MG/0.5ML Pen Inject 7.5 mg into the skin once a week. 06/17/23     topiramate (TOPAMAX) 50 MG tablet TAKE 1 TABLET BY MOUTH TWICE A DAY 05/25/23   Ihor Austin, NP  ustekinumab (STELARA) 90 MG/ML SOSY injection Inject 90 mg into the skin every 3 (three) months. 11/16/19   [provider]  venlafaxine XR (EFFEXOR-XR) 150 MG 24 hr capsule Take 150 mg by mouth daily with breakfast.  05/04/18   [provider]      Allergies    Haemophilus b polysaccharide vaccine, Lisinopril, Lisinopril-hydrochlorothiazide, Wound dressings, Alpha-gal, Beef-derived drug products, Influenza vaccines, Adhesive [tape], and Wound dressing adhesive    Review of Systems   Review of Systems  Respiratory:  Positive for cough.     Physical Exam Updated Vital Signs BP 101/64   Pulse 80   Temp 97.9 F (36.6 C) (Oral)   Resp 17   SpO2 94%  Physical Exam Constitutional:      General: She is not in acute distress. Cardiovascular:     Rate and Rhythm: Normal rate and regular rhythm.     Pulses: Normal pulses.     Heart sounds: Normal heart sounds.  Pulmonary:     Effort: Pulmonary effort is normal. No respiratory distress.     Breath sounds: Normal breath sounds.     Comments: Able to speak in full sentences Musculoskeletal:     Right lower leg: No edema.     Left lower leg: No edema.  Skin:    General: Skin is warm and dry.  Neurological:     Mental Status: She is alert.  Psychiatric:        Mood and Affect: Mood normal.     ED Results / Procedures / Treatments   Labs (all labs ordered are listed, but only abnormal results are displayed) Labs Reviewed  BASIC METABOLIC PANEL WITH GFR - Abnormal; Notable for the following components:      Result Value   BUN 24 (*)    All other components within normal limits  CBC WITH DIFFERENTIAL/PLATELET - Abnormal; Notable for the following components:   WBC 15.2 (*)    Neutro Abs 11.0 (*)    All other components within normal  limits  RESP PANEL BY RT-PCR (RSV, FLU A&B, COVID)  [id number]    EKG EKG Interpretation Date/Time:  Saturday July 04 2023 15:04:25 EDT Ventricular Rate:  85 PR Interval:  178 QRS Duration:  89 QT Interval:  361 QTC Calculation: 430 R Axis:   65  Text Interpretation: Sinus rhythm Abnormal R-wave progression, early transition Confirmed by Anders Simmonds [phone]) on 07/04/2023 3:06:50 PM  Radiology CT Angio Chest PE W/Cm &/Or Wo Cm Result Date: 07/04/2023 CLINICAL DATA:  Persistent cough, recent hospitalization, colored sputum. EXAM: CT ANGIOGRAPHY CHEST WITH CONTRAST TECHNIQUE: Multidetector CT  imaging of the chest was performed using the standard protocol during bolus administration of intravenous contrast. Multiplanar CT image reconstructions and MIPs were obtained to evaluate the vascular anatomy. RADIATION DOSE REDUCTION: This exam was performed according to the departmental dose-optimization program which includes automated exposure control, adjustment of the mA and/or kV according to patient size and/or use of iterative reconstruction technique. CONTRAST:  75mL OMNIPAQUE IOHEXOL 350 MG/ML SOLN COMPARISON:  CT Angiography chest from 11/05/2018. FINDINGS: Cardiovascular: No evidence of embolism to the proximal subsegmental pulmonary artery level. Normal cardiac size. No pericardial effusion. No aortic aneurysm. Mediastinum/Nodes: Visualized thyroid gland appears grossly unremarkable. No solid / cystic mediastinal masses. The esophagus is nondistended precluding optimal assessment. There are few mildly prominent mediastinal and hilar lymph nodes, which do not meet the size criteria for lymphadenopathy and appear grossly similar to the prior study, favoring benign etiology. No axillary lymphadenopathy by size criteria. Lungs/Pleura: The central tracheo-bronchial tree is patent. There is mild, smooth, circumferential thickening of the segmental and subsegmental bronchial walls, throughout bilateral  lungs, which is nonspecific. Findings are most commonly seen with bronchitis or reactive airway disease, such as asthma. There are diffuse patchy ground-glass opacities as well as linear areas of scarring/atelectasis throughout bilateral lungs. Findings are nonspecific and differential diagnosis includes atypical infection, diffuse pan-bronchiolitis, etc. Correlate clinically. There is also mosaic attenuation of lungs, consistent with heterogeneous air trapping related to small airways disease. No mass or dense consolidation. No pleural effusion or pneumothorax. There is a 3 x 6 mm noncalcified opacity in the left major fissure, favored to represent intra fissural lymph node. There are 2 adjacent ground-glass nodules in the right lung lower lobe with largest measuring up to 5 x 7 mm (series 6, image 54). Upper Abdomen: There is a moderate sized hiatal hernia. Surgically absent gallbladder. Scattered diverticula noted in the splenic flexure of colon without diverticulitis. Remaining visualized upper abdominal viscera within normal limits. Musculoskeletal: The visualized soft tissues of the chest wall are grossly unremarkable. No suspicious osseous lesions. There are mild multilevel degenerative changes in the visualized spine. Review of the MIP images confirms the above findings. IMPRESSION: 1. No embolism to the proximal subsegmental pulmonary artery level. 2. There are diffuse patchy ground-glass opacities as well as linear areas of scarring/atelectasis throughout bilateral lungs. Findings are nonspecific and differential diagnosis includes atypical infection, diffuse pan-bronchiolitis, etc. 3. There are 2 adjacent ground-glass nodules in the right lung lower lobe with largest measuring up to 5 x 7 mm. Please see follow-up recommendations below. 4. Multiple other nonacute observations, as described above. Pulmonary nodule follow-up recommendation: Ground glass lung nodule more than or equal to 6 mm: - CT at 6-12  months to confirm persistence, then CT every 2 years until 5 years. Recommendations for evaluation of incidental nodules derived from guidelines developed by the Fleischner Society ( Radiology 2017; [phone]). These guidelines apply to incidental nodules, which can be managed according to the specific recommendations. These guidelines do not apply to patient's younger than 35 years, immunocompromised patients, or patients with cancer. For lung cancer screening, adherence to the existing [employer] of radiology lung CT Screening Reporting and Data System (lung-RADS) guidelines is recommended. High risk factors include older age, heavy smoking, larger nodule size, irregular or spiculated margins and upper lobe location. Clinical risk factors include smoking, exposure to other carcinogens, emphysema, fibrosis, upper lobe location, family history of lung cancer, age and sex. Electronically Signed   By: Jules Schick M.D.   On: 07/04/2023  17:13   DG Chest 2 View Result Date: 07/04/2023 CLINICAL DATA:  Productive cough for 3 weeks. EXAM: CHEST - 2 VIEW COMPARISON:  March 04, 2023. FINDINGS: The heart size and mediastinal contours are within normal limits. Both lungs are clear. The visualized skeletal structures are unremarkable. IMPRESSION: No active cardiopulmonary disease. Electronically Signed   By: Lupita Raider M.D.   On: 07/04/2023 15:38    Procedures Procedures    Medications Ordered in ED Medications  ipratropium-albuterol (DUONEB) 0.5-2.5 (3) MG/3ML nebulizer solution 3 mL (3 mLs Nebulization Given 07/04/23 1526)  methylPREDNISolone sodium succinate (SOLU-MEDROL) 125 mg/2 mL injection 125 mg (125 mg Intravenous Given 07/04/23 1531)  magnesium sulfate IVPB 2 g 50 mL (0 g Intravenous Stopped 07/04/23 1718)  iohexol (OMNIPAQUE) 350 MG/ML injection 75 mL (75 mLs Intravenous Contrast Given 07/04/23 1636)    ED Course/ Medical Decision Making/ A&P                                 Medical  Decision Making Amount and/or Complexity of Data Reviewed Labs: ordered. Radiology: ordered.  Risk Prescription drug management.  Sharyn Lull 53 y.o. presented today for shortness of breath.  Working DDx that I considered at this time includes, but not limited to, asthma/COPD exacerbation, URI, viral illness, anemia, ACS, PE, pneumonia, pleural effusion, lung cancer, CHF, respiratory distress, medication side effect, intoxication.  R/o DDx: asthma/COPD exacerbation, URI, viral illness, anemia, ACS, PE, pneumonia, pleural effusion, lung cancer, CHF, respiratory distress, medication side effect, intoxication: These are considered less likely due to history of present illness, physical exam, labs/imaging findings  Review of prior external notes: 05/28/2023 office visit  Unique Tests and My Independent Interpretation:  CBC: Leukocytosis 15.2 with left shift BMP: Unremarkable EKG: Sinus a 5 bpm, no ST elevations or depressions noted CXR: No acute pathology CTA Chest PE: Nodule in the right lower lung though need follow-up CT imaging in 6 to 12 months Respiratory Panel: Negative  Social Determinants of Health: none  Discussion with Independent Historian:  Husband  Discussion of Management of Tests: None  Risk: Medium: prescription drug management  Risk Stratification Score: None  Staffed with Andria Meuse, DO  Plan: On exam patient was in no acute distress with stable vitals. Physical exam showed no acute findings as patient had clear lungs to auscultation bilaterally. The cardiac monitor was ordered secondary to the patient's history of shortness of breath and to monitor the patient for dysrhythmia. Cardiac monitor by my independent interpretation showed sinus.  Patient states that she was on Levaquin earlier this month and is still coughing up green sputum and having fevers and so we will get chest x-ray along basic labs.  Patient requested breathing treatments we will do this along  with steroids and magnesium as patient does have history of asthma and she does have pneumonia this could be flaring it up.  Will plan on ambulating patient as well.  Chest x-ray is negative for any acute pathology.  Do question if this is bronchitis from patient's pneumonia as she was on Levaquin earlier this month.  Patient's labs do show leukocytosis however patient recently completed a Medrol pack earlier this month which does muddy the waters.  Clinically I believe patient has bronchitis and after discussion with the patient patient would like further evaluation as she is concerned about aspiration pneumonia and so we will get CT imaging.  Patient was ambulated without becoming  hypoxic.  CT scan was nonspecific however patient was made aware of the lung nodules in the right lung and that she will need to have a repeat CT scan done in 6 to 12 months through her primary care provider.  After discussion with the attending we both agree the patient most likely has bronchitis as her white count is most likely up from the steroids and that the CT scan did is ultimately reassuring.  Will discharge with primary care follow-up.  Patient was given return precautions. Patient stable for discharge at this time.  Patient verbalized understanding of plan.  This chart was dictated using voice recognition software.  Despite best efforts to proofread,  errors can occur which can change the documentation meaning.        Final Clinical Impression(s) / ED Diagnoses Final diagnoses:  Bronchitis    Rx / DC Orders ED Discharge Orders          Ordered    benzonatate (TESSALON) 100 MG capsule  Every 8 hours        07/04/23 1749              Netta Corrigan, PA-C 07/04/23 1749    Anders Simmonds T, DO 07/05/23 [phone]

## 2023-07-04 NOTE — ED Triage Notes (Signed)
 Cough x3 weeks. Occasionally productive-green mucous. Started on levaquin x5 days completed course- hx aspiration pna + hiatal hernia. Afebrile. Has noticed swelling in ankles.

## 2023-07-08 ENCOUNTER — Ambulatory Visit: Admitting: Nutrition

## 2023-07-14 ENCOUNTER — Encounter (HOSPITAL_COMMUNITY): Payer: Self-pay

## 2023-07-14 ENCOUNTER — Ambulatory Visit (HOSPITAL_COMMUNITY): Payer: Self-pay | Admitting: Licensed Clinical Social Worker

## 2023-07-15 ENCOUNTER — Ambulatory Visit (HOSPITAL_COMMUNITY): Admitting: Licensed Clinical Social Worker

## 2023-07-17 ENCOUNTER — Telehealth: Payer: Medicare HMO | Admitting: Thoracic Surgery (Cardiothoracic Vascular Surgery)

## 2023-07-17 ENCOUNTER — Encounter: Payer: Self-pay | Admitting: Pulmonary Disease

## 2023-07-17 ENCOUNTER — Ambulatory Visit: Payer: Medicare HMO | Admitting: Pulmonary Disease

## 2023-07-17 VITALS — BP 116/80 | HR 82 | Ht 71.0 in | Wt 356.0 lb

## 2023-07-17 DIAGNOSIS — R911 Solitary pulmonary nodule: Secondary | ICD-10-CM

## 2023-07-17 DIAGNOSIS — R0683 Snoring: Secondary | ICD-10-CM

## 2023-07-17 DIAGNOSIS — R0602 Shortness of breath: Secondary | ICD-10-CM | POA: Diagnosis not present

## 2023-07-17 DIAGNOSIS — K449 Diaphragmatic hernia without obstruction or gangrene: Secondary | ICD-10-CM | POA: Diagnosis not present

## 2023-07-17 DIAGNOSIS — R062 Wheezing: Secondary | ICD-10-CM

## 2023-07-17 MED ORDER — FLUTICASONE-SALMETEROL 115-21 MCG/ACT IN AERO: 2.0000 | INHALATION_SPRAY | Freq: Two times a day (BID) | RESPIRATORY_TRACT | 12 refills | Status: AC

## 2023-07-17 NOTE — Progress Notes (Signed)
 Synopsis: Referred in April 2025 for Pneumonia  Subjective:   PATIENT ID: [name] GENDER: female DOB: 02-22-1971, MRN: [id number]   HPI  Chief Complaint  Patient presents with   Consult    Pt states  wheezing, SOB. she had pneumonia 2024/25, has been Dx by previous  PCP, current was concern about xray that had recently been done     [name] is a 53 year old woman, daily smoker with GERD, hiatal hernia, Barrett's Esophagus, obesity, CVA and hypertension who is referred to pulmonary clinic for pneumonia.   Over the past four years, she has experienced multiple episodes of pneumonia, attributed to aspiration related to a large hiatal hernia and gastroesophageal reflux. She is preparing for gastric bypass surgery combined with hernia repair to address these issues. Her surgical history includes multiple hiatal hernia repairs, gallbladder removal, a paniculectomy, and abdominal hernia repairs.  A recent CT scan from an emergency room visit at the end of March revealed new nodules in the right lung and areas of ground-glass opacity, which were not present in previous imaging studies. She has a history of smoking, having quit in January 2024 after smoking less than a pack a day for over 20 years. She also has a history of occupational exposure to wood dust and fabric dust.  She experiences intermittent shortness of breath, which can occur at rest or with minimal activity, and sometimes wakes up gasping for air. She has been diagnosed with asthma and was previously on Singulair, which was discontinued due to risks of mood. She currently uses an albuterol rescue inhaler as needed, experiencing daily wheezing. She has been told she snores and has had a sleep study revealing moderate sleep apnea with an AHI of 20.8. She attempted CPAP therapy but found it intolerable due to psychological distress related to past trauma.  She has a family history of lung conditions, including a mother  with a lung abscess and an aunt with lung fibrosis. There is also a family history of psoriasis and Crohn's disease.   Past Medical History:  Diagnosis Date   Anxiety    Arthritis    joints   Asthma    Endometriosis    Fat necrosis of breast    right   GERD (gastroesophageal reflux disease)    History of hiatal hernia    s/p repair few times   History of suicide attempt 03/2017   Hypertension    MDD (major depressive disorder)    Panniculitis    Pneumonia 10/2018   in hospital with pneumonia   PONV (postoperative nausea and vomiting)    Psoriasis    treated with Humeria     Family History  Problem Relation Age of Onset   Cancer Maternal Aunt        melanoma   Diabetes Father    Heart attack Father 46     Social History   Socioeconomic History   Marital status: Widowed    Spouse name: Not on file   Number of children: Not on file   Years of education: Not on file   Highest education level: Not on file  Occupational History   Not on file  Tobacco Use   Smoking status: Every Day    Current packs/day: 0.00    Average packs/day: 1 pack/day for 20.0 years (20.0 ttl pk-yrs)    Types: Cigarettes    Start date: 05/20/1995    Last attempt to quit: 05/20/2015    Years since  quitting: 8.1   Smokeless tobacco: Never   Tobacco comments:    < 1 pack  Vaping Use   Vaping status: Never Used  Substance and Sexual Activity   Alcohol use: Yes    Comment: socially   Drug use: Never   Sexual activity: Yes    Birth control/protection: Surgical  Other Topics Concern   Not on file  Social History Narrative   Mother lives with her.       Social Drivers of Corporate investment banker Strain: Low Risk  (05/05/2023)   Received [facility]   Overall Financial Resource Strain (CARDIA)    Difficulty of Paying Living Expenses: Not hard at all  Food Insecurity: No Food Insecurity (05/05/2023)   Received from [facility]   Hunger Vital Sign    Worried About Running Out  of Food in the Last Year: Never true    Ran Out of Food in the Last Year: Never true  Transportation Needs: No Transportation Needs (05/05/2023)   Received from [facility] - Transportation    Lack of Transportation (Medical): No    Lack of Transportation (Non-Medical): No  Physical Activity: Insufficiently Active (09/10/2022)   Received from [facility], [facility]   Exercise Vital Sign    Days of Exercise per Week: 2 days    Minutes of Exercise per Session: 30 min  Stress: Stress Concern Present (09/10/2022)   Received from [facility], [facility]   [employer] of Occupational Health - Occupational Stress Questionnaire    Feeling of Stress : To some extent  Social Connections: Moderately Integrated (09/10/2022)   Received from [facility], [facility]   Social Connection and Isolation Panel [NHANES]    Frequency of Communication with Friends and Family: More than three times a week    Frequency of Social Gatherings with Friends and Family: Not on file    Attends Religious Services: More than 4 times per year    Active Member of [employer] or Organizations: Yes    Attends Banker Meetings: More than 4 times per year    Marital Status: Widowed  Intimate Partner Violence: Not At Risk (03/04/2023)   Humiliation, Afraid, Rape, and Kick questionnaire    Fear of Current or Ex-Partner: No    Emotionally Abused: No    Physically Abused: No    Sexually Abused: No     Allergies  Allergen Reactions   Haemophilus B Polysaccharide Vaccine Swelling and Other (See Comments)    Redness at injection site (approximatly golf ball size) hot to the touch   Lisinopril Hives   Lisinopril-Hydrochlorothiazide Hives and Other (See Comments)    Welts, too   Wound Dressings Hives   Alpha-Gal Diarrhea, Nausea And Vomiting and Other (See Comments)    Blisters inside of mouth,  too   Beef-Derived Drug Products Diarrhea, Nausea And Vomiting and Other (See Comments)    Alpha-gal   Influenza Vaccines     States her "arm had a knot and turned red"   Adhesive [Tape] Rash and Other (See Comments)    Also, blisters   Wound Dressing Adhesive Rash and Other (See Comments)    Blisters, too     Outpatient Medications Prior to Visit  Medication Sig Dispense Refill   acetaminophen (TYLENOL) 500 MG tablet Take 1,500 mg by mouth 2 (two) times daily as needed for mild pain (pain score  1-3), moderate pain (pain score 4-6), headache or fever.     albuterol (VENTOLIN HFA) 108 (90 Base) MCG/ACT inhaler Inhale 1-2 puffs into the lungs every 6 (six) hours as needed for wheezing or shortness of breath. (Patient taking differently: Inhale 1-2 puffs into the lungs every 3 (three) hours as needed for wheezing or shortness of breath.) 6.7 g 0   ALPRAZolam (XANAX) 1 MG tablet Take 1 mg by mouth in the morning and at bedtime.     atorvastatin (LIPITOR) 40 MG tablet Take 1 tablet (40 mg total) by mouth daily. 30 tablet 3   azelastine (ASTELIN) 0.1 % nasal spray Place 2 sprays into both nostrils 2 (two) times daily as needed for rhinitis or allergies.     baclofen (LIORESAL) 10 MG tablet Take 1 tablet (10 mg total) by mouth 2 (two) times daily as needed (migraine). 60 tablet 5   benzonatate (TESSALON) 100 MG capsule Take 1 capsule (100 mg total) by mouth every 8 (eight) hours. 21 capsule 0   Black Cohosh 540 MG CAPS Take 540 mg by mouth at bedtime.     celecoxib (CELEBREX) 200 MG capsule Take 200 mg by mouth daily.     clopidogrel (PLAVIX) 75 MG tablet Take 75 mg by mouth daily.     diltiazem (CARDIZEM CD) 120 MG 24 hr capsule Take 120 mg by mouth daily.     famotidine (PEPCID) 40 MG tablet Take 40 mg by mouth 2 (two) times daily.     fluticasone (FLONASE) 50 MCG/ACT nasal spray Place 2 sprays into both nostrils daily as needed for allergies or rhinitis.     gabapentin (NEURONTIN) 300 MG  capsule Take 300 mg by mouth 3 (three) times daily.     magnesium oxide (MAG-OX) 400 (240 Mg) MG tablet Take 400 mg by mouth daily.     Multiple Vitamin (MULTI-VITAMIN) tablet Take 1 tablet by mouth daily with breakfast.     nebivolol (BYSTOLIC) 5 MG tablet Take 5 mg by mouth at bedtime.     pantoprazole (PROTONIX) 40 MG tablet Take 40 mg by mouth in the morning and at bedtime.     promethazine (PHENERGAN) 25 MG tablet Take 25 mg by mouth daily as needed for nausea or vomiting.     QUEtiapine Fumarate 150 MG TABS Take 150 mg by mouth at bedtime.     SUMAtriptan (IMITREX) 20 MG/ACT nasal spray PLACE 1 SPRAY INTO THE NOSE AS NEEDED FOR MIGRAINE OR HEADACHE. MAY REPEAT IN 2 HOURS IF HEADACHE PERSISTS OR RECURS. 6 each 2   tirzepatide (MOUNJARO) 7.5 MG/0.5ML Pen Inject 7.5 mg into the skin every Wednesday.     tirzepatide (MOUNJARO) 7.5 MG/0.5ML Pen Inject 7.5 mg into the skin once a week. 6 mL 0   topiramate (TOPAMAX) 50 MG tablet TAKE 1 TABLET BY MOUTH TWICE A DAY 180 tablet 1   ustekinumab (STELARA) 90 MG/ML SOSY injection Inject 90 mg into the skin every 3 (three) months.     venlafaxine XR (EFFEXOR-XR) 150 MG 24 hr capsule Take 150 mg by mouth daily with breakfast.      levocetirizine (XYZAL) 5 MG tablet Take 5 mg by mouth every evening.     No facility-administered medications prior to visit.   Review of Systems  Constitutional:  Negative for chills, fever, malaise/fatigue and weight loss.  HENT:  Negative for congestion, sinus pain and sore throat.   Eyes: Negative.   Respiratory:  Positive for shortness of breath. Negative for  cough, hemoptysis, sputum production and wheezing.   Cardiovascular:  Negative for chest pain, palpitations, orthopnea, claudication and leg swelling.  Gastrointestinal:  Positive for heartburn. Negative for abdominal pain, nausea and vomiting.  Genitourinary: Negative.   Musculoskeletal:  Negative for joint pain and myalgias.  Skin:  Negative for rash.   Neurological:  Negative for weakness.  Endo/Heme/Allergies: Negative.   Psychiatric/Behavioral: Negative.     Objective:   Vitals:   07/17/23 0941  BP: 116/80  Pulse: 82  SpO2: 96%  Weight: (!) 356 lb (161.5 kg)  Height: 5\' 11"  (1.803 m)    Physical Exam Constitutional:      General: She is not in acute distress.    Appearance: Normal appearance.  Eyes:     General: No scleral icterus.    Conjunctiva/sclera: Conjunctivae normal.  Cardiovascular:     Rate and Rhythm: Normal rate and regular rhythm.  Pulmonary:     Breath sounds: No wheezing, rhonchi or rales.  Musculoskeletal:     Right lower leg: No edema.     Left lower leg: No edema.  Skin:    General: Skin is warm and dry.  Neurological:     General: No focal deficit present.    CBC    Component Value Date/Time   WBC 15.2 (H) 07/04/2023 1518   RBC 4.35 07/04/2023 1518   HGB 12.6 07/04/2023 1518   HCT 39.5 07/04/2023 1518   PLT 231 07/04/2023 1518   MCV 90.8 07/04/2023 1518   MCH 29.0 07/04/2023 1518   MCHC 31.9 07/04/2023 1518   RDW 15.2 07/04/2023 1518   LYMPHSABS 3.0 07/04/2023 1518   MONOABS 1.0 07/04/2023 1518   EOSABS 0.2 07/04/2023 1518   BASOSABS 0.0 07/04/2023 1518      Latest Ref Rng & Units 07/04/2023    3:18 PM 03/06/2023    5:26 AM 03/05/2023    5:50 AM  BMP  Glucose 70 - 99 mg/dL 91  811  914   BUN 6 - 20 mg/dL 24  18  17    Creatinine 0.44 - 1.00 mg/dL 7.82  9.56  2.13   Sodium 135 - 145 mmol/L 140  140  137   Potassium 3.5 - 5.1 mmol/L 3.6  3.6  3.4   Chloride 98 - 111 mmol/L 108  109  105   CO2 22 - 32 mmol/L 24  23  24    Calcium 8.9 - 10.3 mg/dL 9.1  8.8  8.2    Chest imaging: CTA Chest 07/04/23 1. No embolism to the proximal subsegmental pulmonary artery level. 2. There are diffuse patchy ground-glass opacities as well as linear areas of scarring/atelectasis throughout bilateral lungs. Findings are nonspecific and differential diagnosis includes atypical infection, diffuse  pan-bronchiolitis, etc. 3. There are 2 adjacent ground-glass nodules in the right lung lower lobe with largest measuring up to 5 x 7 mm. Please see follow-up recommendations below. 4. Multiple other nonacute observations, as described above.   PFT:     No data to display          Labs:  Path:  Echo:  Heart Catheterization:     Assessment & Plan:   Wheezing - Plan: Pulmonary Function Test, fluticasone-salmeterol (ADVAIR HFA) 115-21 MCG/ACT inhaler  Snoring - Plan: Home sleep test  Shortness of breath - Plan: Pulmonary Function Test, fluticasone-salmeterol (ADVAIR HFA) 115-21 MCG/ACT inhaler  Hiatal hernia  Morbid (severe) obesity due to excess calories (HCC) - Plan: Home sleep test  Lung nodule  Discussion: Carollee Herter  Glanz is a 53 year old woman, daily smoker with GERD, hiatal hernia, Barrett's Esophagus, obesity, CVA and hypertension who is referred to pulmonary clinic for pneumonia.   Recurrent Aspiration Pneumonia Recurrent pneumonia likely due to aspiration from large hiatal hernia and gastroesophageal reflux.  - Surgery planned to prevent further aspiration and pneumonia.  Hiatal Hernia with Gastroesophageal Reflux Large hiatal hernia causing gastroesophageal reflux and aspiration. -Surgery planned to prevent lung complications.  Pulmonary Nodules and Ground Glass Opacities Pulmonary nodules and ground glass opacities noted on CT. Concern for early lung cancer due to smoking history. - Follow up on pulmonary nodules with repeat imaging in 3-6 months  Asthma Asthma with daily symptoms. Advair prescribed for maintenance due to daily wheezing and shortness of breath. - Prescribe Advair inhaler, two puffs in the morning and evening. - Continue albuterol rescue inhaler as needed. - Order pulmonary function tests.  Sleep Apnea Moderate sleep apnea with AHI of 20.8. CPAP not tolerated. Reassessment needed, especially with positional changes. - Order home sleep  study.  Follow up in 2 months  Melody Comas, MD [place] [facility] Office: [phone]    Current Outpatient Medications:    acetaminophen (TYLENOL) 500 MG tablet, Take 1,500 mg by mouth 2 (two) times daily as needed for mild pain (pain score 1-3), moderate pain (pain score 4-6), headache or fever., Disp: , Rfl:    albuterol (VENTOLIN HFA) 108 (90 Base) MCG/ACT inhaler, Inhale 1-2 puffs into the lungs every 6 (six) hours as needed for wheezing or shortness of breath. (Patient taking differently: Inhale 1-2 puffs into the lungs every 3 (three) hours as needed for wheezing or shortness of breath.), Disp: 6.7 g, Rfl: 0   ALPRAZolam (XANAX) 1 MG tablet, Take 1 mg by mouth in the morning and at bedtime., Disp: , Rfl:    atorvastatin (LIPITOR) 40 MG tablet, Take 1 tablet (40 mg total) by mouth daily., Disp: 30 tablet, Rfl: 3   azelastine (ASTELIN) 0.1 % nasal spray, Place 2 sprays into both nostrils 2 (two) times daily as needed for rhinitis or allergies., Disp: , Rfl:    baclofen (LIORESAL) 10 MG tablet, Take 1 tablet (10 mg total) by mouth 2 (two) times daily as needed (migraine)., Disp: 60 tablet, Rfl: 5   benzonatate (TESSALON) 100 MG capsule, Take 1 capsule (100 mg total) by mouth every 8 (eight) hours., Disp: 21 capsule, Rfl: 0   Black Cohosh 540 MG CAPS, Take 540 mg by mouth at bedtime., Disp: , Rfl:    celecoxib (CELEBREX) 200 MG capsule, Take 200 mg by mouth daily., Disp: , Rfl:    clopidogrel (PLAVIX) 75 MG tablet, Take 75 mg by mouth daily., Disp: , Rfl:    diltiazem (CARDIZEM CD) 120 MG 24 hr capsule, Take 120 mg by mouth daily., Disp: , Rfl:    famotidine (PEPCID) 40 MG tablet, Take 40 mg by mouth 2 (two) times daily., Disp: , Rfl:    fluticasone (FLONASE) 50 MCG/ACT nasal spray, Place 2 sprays into both nostrils daily as needed for allergies or rhinitis., Disp: , Rfl:    fluticasone-salmeterol (ADVAIR HFA) 115-21 MCG/ACT inhaler, Inhale 2 puffs into the lungs 2  (two) times daily., Disp: 1 each, Rfl: 12   gabapentin (NEURONTIN) 300 MG capsule, Take 300 mg by mouth 3 (three) times daily., Disp: , Rfl:    magnesium oxide (MAG-OX) 400 (240 Mg) MG tablet, Take 400 mg by mouth daily., Disp: , Rfl:    Multiple Vitamin (MULTI-VITAMIN)  tablet, Take 1 tablet by mouth daily with breakfast., Disp: , Rfl:    nebivolol (BYSTOLIC) 5 MG tablet, Take 5 mg by mouth at bedtime., Disp: , Rfl:    pantoprazole (PROTONIX) 40 MG tablet, Take 40 mg by mouth in the morning and at bedtime., Disp: , Rfl:    promethazine (PHENERGAN) 25 MG tablet, Take 25 mg by mouth daily as needed for nausea or vomiting., Disp: , Rfl:    QUEtiapine Fumarate 150 MG TABS, Take 150 mg by mouth at bedtime., Disp: , Rfl:    SUMAtriptan (IMITREX) 20 MG/ACT nasal spray, PLACE 1 SPRAY INTO THE NOSE AS NEEDED FOR MIGRAINE OR HEADACHE. MAY REPEAT IN 2 HOURS IF HEADACHE PERSISTS OR RECURS., Disp: 6 each, Rfl: 2   tirzepatide (MOUNJARO) 7.5 MG/0.5ML Pen, Inject 7.5 mg into the skin every Wednesday., Disp: , Rfl:    tirzepatide (MOUNJARO) 7.5 MG/0.5ML Pen, Inject 7.5 mg into the skin once a week., Disp: 6 mL, Rfl: 0   topiramate (TOPAMAX) 50 MG tablet, TAKE 1 TABLET BY MOUTH TWICE A DAY, Disp: 180 tablet, Rfl: 1   ustekinumab (STELARA) 90 MG/ML SOSY injection, Inject 90 mg into the skin every 3 (three) months., Disp: , Rfl:    venlafaxine XR (EFFEXOR-XR) 150 MG 24 hr capsule, Take 150 mg by mouth daily with breakfast. , Disp: , Rfl:    levocetirizine (XYZAL) 5 MG tablet, Take 5 mg by mouth every evening., Disp: , Rfl:

## 2023-07-17 NOTE — Patient Instructions (Signed)
 Start advair HFA inhaler 2 puffs twice daily - rinse mouth out after each use  Use albuterol inhaler 1-2 puffs every 4-6 hours as needed  Continue current reflux precautions  We will schedule you for pulmonary function tests at the hospital  We will repeat a home sleep study  Follow up in 2 months, call sooner if needed

## 2023-07-23 ENCOUNTER — Other Ambulatory Visit (HOSPITAL_BASED_OUTPATIENT_CLINIC_OR_DEPARTMENT_OTHER): Payer: Self-pay

## 2023-07-23 MED ORDER — MOUNJARO 7.5 MG/0.5ML ~~LOC~~ SOAJ
7.5000 mg | SUBCUTANEOUS | 1 refills | Status: DC
  Filled 2023-07-23 – 2023-09-12 (×2): qty 6, 84d supply, fill #0
  Filled 2023-12-18: qty 6, 84d supply, fill #1

## 2023-07-27 ENCOUNTER — Ambulatory Visit (INDEPENDENT_AMBULATORY_CARE_PROVIDER_SITE_OTHER): Admitting: Thoracic Surgery (Cardiothoracic Vascular Surgery)

## 2023-07-27 DIAGNOSIS — K449 Diaphragmatic hernia without obstruction or gangrene: Secondary | ICD-10-CM

## 2023-07-27 NOTE — Progress Notes (Signed)
     [address]       [name] [address]             [phone]       Patient: Home Provider: Office Consent for Telemedicine visit obtained.  Today's visit was completed via a real-time telehealth (see specific modality noted below). The patient/authorized person provided oral consent at the time of the visit to engage in a telemedicine encounter with the present provider at [facility]. The patient/authorized person was informed of the potential benefits, limitations, and risks of telemedicine. The patient/authorized person expressed understanding that the laws that protect confidentiality also apply to telemedicine. The patient/authorized person acknowledged understanding that telemedicine does not provide emergency services and that he or she would need to call 911 or proceed to the nearest hospital for help if such a need arose.   Total time spent in the clinical discussion 10 minutes.  Telehealth Modality: Phone visit (audio only)  I had a telephone visit with [name].  No major changes since her last appointment.  She is still agreeable to proceed with a bariatric procedure in conjunction with her redo-hiatal hernia repair.  I reiterated to her that I will be available to assist general surgery with any major adhesions or scar tissue in the chest.

## 2023-07-30 ENCOUNTER — Encounter

## 2023-07-30 DIAGNOSIS — R0683 Snoring: Secondary | ICD-10-CM

## 2023-08-04 ENCOUNTER — Encounter: Attending: General Surgery | Admitting: Dietician

## 2023-08-04 ENCOUNTER — Encounter: Payer: Self-pay | Admitting: Dietician

## 2023-08-04 VITALS — Ht 71.0 in | Wt 347.3 lb

## 2023-08-04 DIAGNOSIS — E669 Obesity, unspecified: Secondary | ICD-10-CM | POA: Diagnosis present

## 2023-08-04 DIAGNOSIS — Z6841 Body Mass Index (BMI) 40.0 and over, adult: Secondary | ICD-10-CM | POA: Diagnosis not present

## 2023-08-04 DIAGNOSIS — Z713 Dietary counseling and surveillance: Secondary | ICD-10-CM | POA: Insufficient documentation

## 2023-08-04 DIAGNOSIS — K219 Gastro-esophageal reflux disease without esophagitis: Secondary | ICD-10-CM | POA: Diagnosis not present

## 2023-08-04 DIAGNOSIS — K449 Diaphragmatic hernia without obstruction or gangrene: Secondary | ICD-10-CM | POA: Diagnosis not present

## 2023-08-04 NOTE — Progress Notes (Signed)
 Supervised Weight Loss Visit Bariatric Nutrition Education Appt Start Time: 1402    End Time: 1439  Planned surgery: RYGB Pt expectation of surgery: to be about 180-200; relief from hiatal hernia  Referral stated Supervised Weight Loss (SWL) visits needed: 6 months  1 out of 6 SWL Appointments   NUTRITION ASSESSMENT   Anthropometrics  Start weight at [facility]: 347.6 lbs (date: 07/02/2023)  Height: 71 in Weight today: 347.3 lbs BMI: 48.44 kg/m2     Clinical   Pharmacotherapy: History of weight loss medication used: phentermine, Mounjaro   Medical hx: obesity, stroke, asthma, HTN, T2DM, GERD, hiatal hernia, gastric band placement and removal, barrett's esophagus, Medications: see list  Labs: 03/05/2023: glucose 100, potassium 3.4, calcium  8.2 Notable signs/symptoms: none noted Any previous deficiencies? No  Lifestyle & Dietary Hx Pt states her son got her a big [employer], stating she drinks about two of those a day, and other fluids. Pt states she takes black cohosh for hot flashes, stating it has really helped. Pt states is parking father away when going out, to get more walking in. Pt states her surgeon told her that her pouch from the surgery will be the size of about 3 eggs, instead of the usual 1 egg size for most RYGB surgeries. Pt states she doesn't tolerate normal stiches/sutchers. Pt states she previously had the gastric band placed around 2006, and has had it removed about 2 years later. Pt states she has been doing better eating 3 meals/times per day. Pt states she has been bit by 2 more ticks since last visit. 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 Pt states she is used to not drinking with her meals, since she had the band previously.  Estimated daily fluid intake: 64 oz Supplements: multivitamin (women's 50+), biotin , black cohosh, magnesium  (prescribed), folic acid  Current average weekly physical activity: playing with dogs for 30 minutes twice a  day.  24-Hr Dietary Recall First Meal: skip Snack: Seeq Protein powder, mixed with water or Orgain pea (plant based) protein with 2% lactaid milk with coffee and ice(iced coffee) Second Meal: skip Snack: Seeq Protien powder mixed with water Third Meal: greek yogurt, with Kind peanut butter granola, with blueberries sometimes. Snack: Ritz crackers with cheese Beverages: diet green tea, water  Alcoholic beverages per week: 0   Estimated Energy Needs Calories: 1500  NUTRITION DIAGNOSIS  Overweight/obesity ([place]-3.3) related to past poor dietary habits and physical inactivity as evidenced by patient w/ planned RYGB surgery following dietary guidelines for continued weight loss.   NUTRITION INTERVENTION  Nutrition counseling (C-1) and education (E-2) to facilitate bariatric surgery goals.  Increasing physical activity in bariatric patients can significantly improve weight loss, cardiorespiratory fitness, and overall health. It's recommended to gradually increase activity levels, starting with short walks and progressing to moderate-intensity exercise, such as brisk walking or cycling. Incorporating strength training and varied activities can also be beneficial.   Eat 3 small meals and 2-3 small snacks at regularly scheduled intervals throughout the day. Avoid skipping meals. Avoid "grazing" or nibbling throughout the day. Eat slowly and stop eating when you feel comfortably full.  Pre-Op Goals Reviewed with the Patient  Pre-Op Goals Progress & New Goals New: increase physical activity; take dogs for a walk, daily for 30 minutes minimum; get back to the gym with mother, 3 days per week for 60 minutes Continue: avoid skipping meals; aim for at least 3 meals per day.  Continue: separating fluid from food.  Handouts Provided Include  Learning Style & Readiness for Change Teaching method utilized: Visual & Auditory  Demonstrated degree of understanding via: Teach Back  Readiness Level:  preparation Barriers to learning/adherence to lifestyle change: fear of emesis  RD's Notes for next Visit  Patient progress toward chosen goals.  MONITORING & EVALUATION Dietary intake, weekly physical activity, body weight, and pre-op goals in 1 month.   Next Steps  Patient is to return to [facility] in one month for next SWL visit.

## 2023-08-12 ENCOUNTER — Encounter (HOSPITAL_BASED_OUTPATIENT_CLINIC_OR_DEPARTMENT_OTHER): Payer: Self-pay

## 2023-08-12 ENCOUNTER — Telehealth (HOSPITAL_BASED_OUTPATIENT_CLINIC_OR_DEPARTMENT_OTHER): Payer: Self-pay

## 2023-08-12 NOTE — Telephone Encounter (Signed)
 My chart message sent to pt to inform we will call once these are received and reviewed   Copied from CRM [id number]. Topic: Clinical - Lab/Test Results >> Aug 12, 2023  2:39 PM Alverda Joe S wrote: Reason for CRM: patient is calling to check the status of home sleep study. Please call if you have further information

## 2023-08-17 DIAGNOSIS — R069 Unspecified abnormalities of breathing: Secondary | ICD-10-CM | POA: Diagnosis not present

## 2023-08-20 ENCOUNTER — Encounter: Payer: Self-pay | Admitting: Dietician

## 2023-08-20 ENCOUNTER — Encounter: Attending: General Surgery | Admitting: Dietician

## 2023-08-20 VITALS — Ht 71.0 in | Wt 349.7 lb

## 2023-08-20 DIAGNOSIS — K219 Gastro-esophageal reflux disease without esophagitis: Secondary | ICD-10-CM | POA: Insufficient documentation

## 2023-08-20 DIAGNOSIS — Z6841 Body Mass Index (BMI) 40.0 and over, adult: Secondary | ICD-10-CM | POA: Diagnosis not present

## 2023-08-20 DIAGNOSIS — Z9884 Bariatric surgery status: Secondary | ICD-10-CM | POA: Insufficient documentation

## 2023-08-20 DIAGNOSIS — K2270 Barrett's esophagus without dysplasia: Secondary | ICD-10-CM | POA: Insufficient documentation

## 2023-08-20 DIAGNOSIS — K449 Diaphragmatic hernia without obstruction or gangrene: Secondary | ICD-10-CM | POA: Insufficient documentation

## 2023-08-20 DIAGNOSIS — E669 Obesity, unspecified: Secondary | ICD-10-CM

## 2023-08-20 DIAGNOSIS — Z713 Dietary counseling and surveillance: Secondary | ICD-10-CM | POA: Insufficient documentation

## 2023-08-20 NOTE — Progress Notes (Signed)
 Supervised Weight Loss Visit Bariatric Nutrition Education Appt Start Time: 1407    End Time: 1435  Planned surgery: RYGB Pt expectation of surgery: to be about 180-200; relief from hiatal hernia  Referral stated Supervised Weight Loss (SWL) visits needed: 6 months  2 out of 6 SWL Appointments   NUTRITION ASSESSMENT   Anthropometrics  Start weight at [facility]: 347.6 lbs (date: 07/02/2023)  Height: 71 in Weight today: 349.7 lbs BMI: 48.77 kg/m2     Clinical   Pharmacotherapy: History of weight loss medication used: phentermine, Mounjaro   Medical hx: obesity, stroke, asthma, HTN, T2DM, GERD, hiatal hernia, gastric band placement and removal, barrett's esophagus, Medications: see list  Labs: 03/05/2023: glucose 100, potassium 3.4, calcium  8.2 Notable signs/symptoms: none noted Any previous deficiencies? No  Lifestyle & Dietary Hx  Pt states she has not been feeling well the past few days, stating she hurt her back bending over. Pt states this is the first time she has felt this way. Pt states she does have arthritis and degenerative joint disease. Pt states she has been drinking body armour with no sugar, stating that they have 10 calories, and states she is still drinking from her [place]. 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  Estimated daily fluid intake: 64+ oz Supplements: multivitamin (women's 50+), biotin , black cohosh, magnesium  (prescribed), folic acid  Current average weekly physical activity: playing with dogs for 30 minutes twice a day.  24-Hr Dietary Recall First Meal: boiled eggs sliced, 9 grain breaded toasted, butter, sliced avocado, pepper or yogurt with granola and blueberries Snack: Seeq Protein powder, mixed with water or Orgain pea (plant based) protein with 2% lactaid milk with coffee and ice(iced coffee) Second Meal: chicken or tuna packs, wheat saltines or wrap or [place] and [place] pepperonin and cheese wrap in the toaster oven Snack: Seeq Protien  powder mixed with water Third Meal: greek yogurt, with Kind peanut butter granola, with blueberries sometimes or vegan riblet patties or vegan burger or [place] chili (might add black beans to eat), whole wheat saltine crackers Snack: Ritz crackers with cheese or pork rinds Beverages: diet green tea, water, body armour no sugar  Alcoholic beverages per week: 0   Estimated Energy Needs Calories: 1500  NUTRITION DIAGNOSIS  Overweight/obesity ([place]-3.3) related to past poor dietary habits and physical inactivity as evidenced by patient w/ planned RYGB surgery following dietary guidelines for continued weight loss.  NUTRITION INTERVENTION  Nutrition counseling (C-1) and education (E-2) to facilitate bariatric surgery goals.  Increasing physical activity in bariatric patients can significantly improve weight loss, cardiorespiratory fitness, and overall health. It's recommended to gradually increase activity levels, starting with short walks and progressing to moderate-intensity exercise, such as brisk walking or cycling. Incorporating strength training and varied activities can also be beneficial.   Eat 3 small meals and 2-3 small snacks at regularly scheduled intervals throughout the day. Avoid skipping meals. Avoid "grazing" or nibbling throughout the day. Eat slowly and stop eating when you feel comfortably full.  Tracking protein is extremely important after bariatric surgery for several key reasons: Preserving Lean Muscle Mass: When you lose weight rapidly, your body can break down muscle for energy. Adequate protein intake helps prevent this muscle loss, which is crucial for maintaining your metabolism.    Promoting Satiety: Protein helps you feel fuller for longer, which can aid in managing your smaller portion sizes and preventing excessive snacking.    Aiding Healing: Protein is essential for tissue repair and wound healing after  surgery.    Supporting Metabolism: Maintaining muscle mass  supports a healthy metabolism, making it easier to lose and maintain weight.    Preventing Nutritional Deficiencies: After surgery, your body's ability to absorb nutrients might be altered. Prioritizing protein ensures you're getting a vital macronutrient.     Pre-Op Goals Reviewed with the Patient  Pre-Op Goals Progress & New Goals Re-engage: increase physical activity; take dogs for a walk, daily for 30 minutes minimum; get back to the gym with mother, 3 days per week for 60 minutes Continue: avoid skipping meals; aim for at least 3 meals per day.  Continue: separating fluid from food. New: track protein; aim for 60 grams per day; aim for a protein with every meal or snack  Handouts Provided Include  Bariatric MyPlate  Learning Style & Readiness for Change Teaching method utilized: Visual & Auditory  Demonstrated degree of understanding via: Teach Back  Readiness Level: preparation Barriers to learning/adherence to lifestyle change: fear of emesis  RD's Notes for next Visit  Patient progress toward chosen goals.  MONITORING & EVALUATION Dietary intake, weekly physical activity, body weight, and pre-op goals in 1 month.   Next Steps  Patient is to return to [facility] in one month for next SWL visit.

## 2023-08-21 ENCOUNTER — Ambulatory Visit (HOSPITAL_BASED_OUTPATIENT_CLINIC_OR_DEPARTMENT_OTHER): Admitting: Pulmonary Disease

## 2023-08-21 DIAGNOSIS — R062 Wheezing: Secondary | ICD-10-CM

## 2023-08-21 DIAGNOSIS — R0602 Shortness of breath: Secondary | ICD-10-CM

## 2023-08-21 LAB — PULMONARY FUNCTION TEST
DL/VA % pred: 122 %
DL/VA: 4.97 ml/min/mmHg/L
DLCO cor % pred: 87 %
DLCO cor: 22.66 ml/min/mmHg
DLCO unc % pred: 87 %
DLCO unc: 22.66 ml/min/mmHg
FEF 25-75 Post: 2.84 L/s
FEF 25-75 Pre: 2.03 L/s
FEF2575-%Change-Post: 39 %
FEF2575-%Pred-Post: 93 %
FEF2575-%Pred-Pre: 67 %
FEV1-%Change-Post: 7 %
FEV1-%Pred-Post: 74 %
FEV1-%Pred-Pre: 69 %
FEV1-Post: 2.56 L
FEV1-Pre: 2.37 L
FEV1FVC-%Change-Post: 6 %
FEV1FVC-%Pred-Pre: 96 %
FEV6-%Change-Post: 1 %
FEV6-%Pred-Post: 74 %
FEV6-%Pred-Pre: 72 %
FEV6-Post: 3.13 L
FEV6-Pre: 3.08 L
FEV6FVC-%Pred-Post: 103 %
FEV6FVC-%Pred-Pre: 103 %
FVC-%Change-Post: 1 %
FVC-%Pred-Post: 71 %
FVC-%Pred-Pre: 70 %
FVC-Post: 3.13 L
FVC-Pre: 3.08 L
Post FEV1/FVC ratio: 82 %
Post FEV6/FVC ratio: 100 %
Pre FEV1/FVC ratio: 77 %
Pre FEV6/FVC Ratio: 100 %
RV % pred: 110 %
RV: 2.45 L
TLC % pred: 89 %
TLC: 5.45 L

## 2023-08-21 NOTE — Progress Notes (Signed)
 Full PFT performed today.

## 2023-08-21 NOTE — Patient Instructions (Signed)
 Full PFT performed today.

## 2023-09-04 ENCOUNTER — Encounter: Payer: Self-pay | Admitting: Pulmonary Disease

## 2023-09-04 ENCOUNTER — Encounter: Payer: Self-pay | Admitting: Adult Health

## 2023-09-07 ENCOUNTER — Ambulatory Visit: Payer: Medicare HMO | Admitting: Adult Health

## 2023-09-10 ENCOUNTER — Telehealth: Payer: Self-pay | Admitting: Neurology

## 2023-09-10 ENCOUNTER — Other Ambulatory Visit (HOSPITAL_COMMUNITY): Payer: Self-pay

## 2023-09-10 ENCOUNTER — Telehealth: Payer: Self-pay

## 2023-09-10 MED ORDER — SUMATRIPTAN 20 MG/ACT NA SOLN: 20.0000 mg | NASAL | 2 refills | Status: DC | PRN

## 2023-09-10 NOTE — Telephone Encounter (Signed)
 Pt called to request refill for medication  SUMAtriptan  (IMITREX ) 20 MG/ACT nasal spray  Pt is completely out of medication  and wouldlike medication to be sent to .  [facility] [place], [place] - [address] [place] Phone: [phone]  Fax: [phone]

## 2023-09-10 NOTE — Telephone Encounter (Signed)
Refill sent for the pt

## 2023-09-10 NOTE — Telephone Encounter (Signed)
 Please advise. Thanks.

## 2023-09-11 ENCOUNTER — Other Ambulatory Visit (HOSPITAL_COMMUNITY): Payer: Self-pay

## 2023-09-11 NOTE — Telephone Encounter (Signed)
 Pharmacy Patient Advocate Encounter  Received notification from [employer] that Prior Authorization for SUMAtriptan  20MG /ACT solution has been APPROVED from 09/11/2023 to 04/06/2024. Ran test claim, Copay is $0. This test claim was processed through [facility]- copay amounts may vary at other pharmacies due to pharmacy/plan contracts, or as the patient moves through the different stages of their insurance plan.   PA #/Case ID/Reference #: PA Case ID #: [id number]

## 2023-09-11 NOTE — Telephone Encounter (Signed)
 Pharmacy Patient Advocate Encounter   Received notification from CoverMyMeds that prior authorization for SUMAtriptan  20MG /ACT solution is required/requested.   Insurance verification completed.   The patient is insured through [place] .   Per test claim: PA required; PA submitted to above mentioned insurance via CoverMyMeds Key/confirmation #/EOC [id number] Status is pending

## 2023-09-12 ENCOUNTER — Other Ambulatory Visit (HOSPITAL_BASED_OUTPATIENT_CLINIC_OR_DEPARTMENT_OTHER): Payer: Self-pay

## 2023-09-17 ENCOUNTER — Encounter: Payer: Self-pay | Admitting: Dietician

## 2023-09-17 ENCOUNTER — Encounter: Attending: General Surgery | Admitting: Dietician

## 2023-09-17 ENCOUNTER — Ambulatory Visit: Admitting: Dietician

## 2023-09-17 DIAGNOSIS — E669 Obesity, unspecified: Secondary | ICD-10-CM | POA: Diagnosis not present

## 2023-09-17 DIAGNOSIS — I10 Essential (primary) hypertension: Secondary | ICD-10-CM | POA: Diagnosis not present

## 2023-09-17 DIAGNOSIS — Z8673 Personal history of transient ischemic attack (TIA), and cerebral infarction without residual deficits: Secondary | ICD-10-CM | POA: Diagnosis not present

## 2023-09-17 DIAGNOSIS — K449 Diaphragmatic hernia without obstruction or gangrene: Secondary | ICD-10-CM | POA: Insufficient documentation

## 2023-09-17 DIAGNOSIS — Z713 Dietary counseling and surveillance: Secondary | ICD-10-CM | POA: Insufficient documentation

## 2023-09-17 DIAGNOSIS — J45909 Unspecified asthma, uncomplicated: Secondary | ICD-10-CM | POA: Diagnosis not present

## 2023-09-17 DIAGNOSIS — E119 Type 2 diabetes mellitus without complications: Secondary | ICD-10-CM | POA: Insufficient documentation

## 2023-09-17 DIAGNOSIS — K219 Gastro-esophageal reflux disease without esophagitis: Secondary | ICD-10-CM | POA: Insufficient documentation

## 2023-09-17 NOTE — Progress Notes (Signed)
 Supervised Weight Loss Visit Bariatric Nutrition Education Appt Start Time: 1157    End Time: 1216  Planned surgery: RYGB Pt expectation of surgery: to be about 180-200; relief from hiatal hernia  Referral stated Supervised Weight Loss (SWL) visits needed: 6 months  3 out of 6 SWL Appointments   Virtual Visit via Video Note  I connected with Glorya Larsson on 09/17/23 at 12:00 PM EDT virtually and verified that I am speaking with the correct person using two identifiers.  NUTRITION ASSESSMENT   Anthropometrics  Start weight at [facility]: 347.6 lbs (date: 07/02/2023)  Height: 71 in Weight today:  lbs BMI:  kg/m2     Clinical   Pharmacotherapy: History of weight loss medication used: phentermine, Mounjaro   Medical hx: obesity, stroke, asthma, HTN, T2DM, GERD, hiatal hernia, gastric band placement and removal, barrett's esophagus, Medications: see list  Labs: 03/05/2023: glucose 100, potassium 3.4, calcium  8.2 Notable signs/symptoms: none noted Any previous deficiencies? No  Lifestyle & Dietary Hx  Pt states she is on an antibiotic Cefuroxime axetil for 10 days. Pt states she has had another tick bite, stating she hasn't felt herself for the past 6 months. Pt states she has been feeling cold lately. Pt states she has not eaten much since she has been on the antibiotic, stating she has been vomiting. Pt states she does not drink with her meals, stating she has kept that habit from when she had the band placement and removal.  Estimated daily fluid intake: 64+ oz Supplements: multivitamin (women's 50+), biotin , black cohosh, magnesium  (prescribed), folic acid  Current average weekly physical activity: playing with dogs for 30 minutes twice a day.  24-Hr Dietary Recall First Meal: boiled eggs sliced, 9 grain breaded toasted, butter, sliced avocado, pepper or yogurt with granola and blueberries Snack: Seeq Protein powder, mixed with water or Orgain pea (plant based) protein with 2%  lactaid milk with coffee and ice(iced coffee) Second Meal: chicken or tuna packs, wheat saltines or wrap or [place] and [place] pepperonin and cheese wrap in the toaster oven Snack: Seeq Protien powder mixed with water Third Meal: greek yogurt, with Kind peanut butter granola, with blueberries sometimes or vegan riblet patties or vegan burger or [place] chili (might add black beans to eat), whole wheat saltine crackers Snack: Ritz crackers with cheese or pork rinds Beverages: diet green tea, water, body armour no sugar  Alcoholic beverages per week: 0   Estimated Energy Needs Calories: 1500  NUTRITION DIAGNOSIS  Overweight/obesity ([place]-3.3) related to past poor dietary habits and physical inactivity as evidenced by patient w/ planned RYGB surgery following dietary guidelines for continued weight loss.  NUTRITION INTERVENTION  Nutrition counseling (C-1) and education (E-2) to facilitate bariatric surgery goals.  Increasing physical activity in bariatric patients can significantly improve weight loss, cardiorespiratory fitness, and overall health. It's recommended to gradually increase activity levels, starting with short walks and progressing to moderate-intensity exercise, such as brisk walking or cycling. Incorporating strength training and varied activities can also be beneficial.   Tracking protein is extremely important after bariatric surgery for several key reasons: Preserving Lean Muscle Mass: When you lose weight rapidly, your body can break down muscle for energy. Adequate protein intake helps prevent this muscle loss, which is crucial for maintaining your metabolism.    Promoting Satiety: Protein helps you feel fuller for longer, which can aid in managing your smaller portion sizes and preventing excessive snacking.    Aiding Healing: Protein is essential for tissue repair and wound healing  after surgery.    Supporting Metabolism: Maintaining muscle mass supports a healthy metabolism,  making it easier to lose and maintain weight.     Pre-Op Goals Reviewed with the Patient  Pre-Op Goals Progress & New Goals Re-engage when felling better: increase physical activity; take dogs for a walk, daily for 30 minutes minimum; get back to the gym with mother, 3 days per week for 60 minutes Continue: avoid skipping meals; aim for at least 3 meals per day.  Continue: separating fluid from food. Re-engage: track protein; aim for 60 grams per day; aim for a protein with every meal or snack  Handouts Provided Include    Learning Style & Readiness for Change Teaching method utilized: Visual & Auditory  Demonstrated degree of understanding via: Teach Back  Readiness Level: preparation Barriers to learning/adherence to lifestyle change: fear of emesis  RD's Notes for next Visit  Patient progress toward chosen goals.  MONITORING & EVALUATION Dietary intake, weekly physical activity, body weight, and pre-op goals in 1 month.   Next Steps  Patient is to return to [facility] in one month for next SWL visit.

## 2023-09-22 ENCOUNTER — Ambulatory Visit (INDEPENDENT_AMBULATORY_CARE_PROVIDER_SITE_OTHER): Admitting: Pulmonary Disease

## 2023-09-22 ENCOUNTER — Encounter: Payer: Self-pay | Admitting: Pulmonary Disease

## 2023-09-22 ENCOUNTER — Encounter (HOSPITAL_BASED_OUTPATIENT_CLINIC_OR_DEPARTMENT_OTHER)

## 2023-09-22 VITALS — BP 130/87 | HR 93 | Ht 71.0 in | Wt 350.0 lb

## 2023-09-22 DIAGNOSIS — R5383 Other fatigue: Secondary | ICD-10-CM | POA: Diagnosis not present

## 2023-09-22 DIAGNOSIS — M2550 Pain in unspecified joint: Secondary | ICD-10-CM

## 2023-09-22 DIAGNOSIS — G4733 Obstructive sleep apnea (adult) (pediatric): Secondary | ICD-10-CM

## 2023-09-22 NOTE — Patient Instructions (Addendum)
 Continue Advair inhaler 2 puffs twice daily - rinse mouth out after each use  We will schedule you for a CPAP titration study - recommend using a Dream Wear Full face mask  We will order you a CPAP machine once we have the titration study done  Follow up in 3 months, call sooner if needed

## 2023-09-22 NOTE — Progress Notes (Signed)
 Synopsis: Referred in April 2025 for Pneumonia  Subjective:   PATIENT ID: [name] GENDER: female DOB: 11/11/1970, MRN: [id number]   HPI  Chief Complaint  Patient presents with   Follow-up   [name] is a 53 year old woman, daily smoker with GERD, hiatal hernia, Barrett's Esophagus, obesity, CVA and hypertension who returns to pulmonary clinic for shortness of breath and sleep apnea.   Initial OV 07/17/23 Over the past four years, she has experienced multiple episodes of pneumonia, attributed to aspiration related to a large hiatal hernia and gastroesophageal reflux. She is preparing for gastric bypass surgery combined with hernia repair to address these issues. Her surgical history includes multiple hiatal hernia repairs, gallbladder removal, a paniculectomy, and abdominal hernia repairs.  A recent CT scan from an emergency room visit at the end of March revealed new nodules in the right lung and areas of ground-glass opacity, which were not present in previous imaging studies. She has a history of smoking, having quit in January 2024 after smoking less than a pack a day for over 20 years. She also has a history of occupational exposure to wood dust and fabric dust.  She experiences intermittent shortness of breath, which can occur at rest or with minimal activity, and sometimes wakes up gasping for air. She has been diagnosed with asthma and was previously on Singulair , which was discontinued due to risks of mood. She currently uses an albuterol  rescue inhaler as needed, experiencing daily wheezing. She has been told she snores and has had a sleep study revealing moderate sleep apnea with an AHI of 20.8. She attempted CPAP therapy but found it intolerable due to psychological distress related to past trauma.  She has a family history of lung conditions, including a mother with a lung abscess and an aunt with lung fibrosis. There is also a family history of psoriasis and Crohn's  disease.   OV 09/22/23 PFTs from 08/21/23 show non-specific pulmonary function patter with reduced FEV1 and FVC but normal TLC. Ratio is otherwise normal and DLCO is normal.   She experiences significant fatigue and joint pain, worsening over the last two months. There is no shortness of breath. She uses an Advair inhaler, two puffs twice a day, without issues.  A sleep study shows an apnea-hypopnea index of 14.4 events per hour, indicating mild sleep apnea. She has difficulty using CPAP due to discomfort and psychological factors. Oxygen levels drop to 80% at times.  She is preparing for surgery to repair a hiatal hernia and undergo gastric bypass. She has a history of aspiration pneumonia multiple times in the past.  LABS CRP: 25 mg/L TSH: 3.13 mIU/L ANA: 1:80, speckled pattern Anti-centromere antibody: negative Anti-dsDNA antibody: negative Anti-smooth muscle antibody: negative Anti-U1 RNP: negative Anti-Ro (SSA): negative Anti-La (SSB): negative Anti-Scl-70: negative Cardiolipin antibody: negative Antithyroid antibody: negative Chromatin antibody: negative CCP: negative Rheumatoid factor: negative Lyme antibodies: negative Hemoglobin: normal Platelets: normal  Past Medical History:  Diagnosis Date   Anxiety    Arthritis    joints   Asthma    Endometriosis    Fat necrosis of breast    right   GERD (gastroesophageal reflux disease)    History of hiatal hernia    s/p repair few times   History of suicide attempt 03/2017   Hypertension    MDD (major depressive disorder)    Panniculitis    Pneumonia 10/2018   in hospital with pneumonia   PONV (postoperative nausea and vomiting)  Psoriasis    treated with Humeria     Family History  Problem Relation Age of Onset   Cancer Maternal Aunt        melanoma   Diabetes Father    Heart attack Father 18     Social History   Socioeconomic History   Marital status: Widowed    Spouse name: Not on file   Number of  children: Not on file   Years of education: Not on file   Highest education level: Not on file  Occupational History   Not on file  Tobacco Use   Smoking status: Every Day    Current packs/day: 0.00    Average packs/day: 1 pack/day for 20.0 years (20.0 ttl pk-yrs)    Types: Cigarettes    Start date: 05/20/1995    Last attempt to quit: 05/20/2015    Years since quitting: 8.3   Smokeless tobacco: Never   Tobacco comments:    < 1 pack  Vaping Use   Vaping status: Never Used  Substance and Sexual Activity   Alcohol use: Yes    Comment: socially   Drug use: Never   Sexual activity: Yes    Birth control/protection: Surgical  Other Topics Concern   Not on file  Social History Narrative   Mother lives with her.       Social Drivers of Corporate investment banker Strain: Low Risk  (05/05/2023)   Received from [facility]   Overall Financial Resource Strain (CARDIA)    Difficulty of Paying Living Expenses: Not hard at all  Food Insecurity: No Food Insecurity (05/05/2023)   Received from [facility]   Hunger Vital Sign    Within the past 12 months, you worried that your food would run out before you got the money to buy more.: Never true    Within the past 12 months, the food you bought just didn't last and you didn't have money to get more.: Never true  Transportation Needs: No Transportation Needs (05/05/2023)   Received from [facility] - Transportation    Lack of Transportation (Medical): No    Lack of Transportation (Non-Medical): No  Physical Activity: Insufficiently Active (09/10/2022)   Received from [facility]   Exercise Vital Sign    On average, how many days per week do you engage in moderate to strenuous exercise (like a brisk walk)?: 2 days    On average, how many minutes do you engage in exercise at this level?: 30 min  Stress: Stress Concern Present (09/10/2022)   Received from [facility] of  Occupational Health - Occupational Stress Questionnaire    Feeling of Stress : To some extent  Social Connections: Moderately Integrated (09/10/2022)   Received from [facility]   Social Connection and Isolation Panel    In a typical week, how many times do you talk on the phone with family, friends, or neighbors?: More than three times a week    Frequency of Social Gatherings with Friends and Family: Not on file    How often do you attend church or religious services?: More than 4 times per year    Do you belong to any clubs or organizations such as church groups, unions, fraternal or athletic groups, or school groups?: Yes    How often do you attend meetings of the clubs or organizations you belong to?: More than 4 times per year  Are you married, widowed, divorced, separated, never married, or living with a partner?: Widowed  Intimate Partner Violence: Not At Risk (03/04/2023)   Humiliation, Afraid, Rape, and Kick questionnaire    Fear of Current or Ex-Partner: No    Emotionally Abused: No    Physically Abused: No    Sexually Abused: No     Allergies  Allergen Reactions   Haemophilus B Polysaccharide Vaccine Swelling and Other (See Comments)    Redness at injection site (approximatly golf ball size) hot to the touch   Lisinopril  Hives   Lisinopril -Hydrochlorothiazide  Hives and Other (See Comments)    Welts, too   Wound Dressings Hives   Alpha-Gal Diarrhea, Nausea And Vomiting and Other (See Comments)    Blisters inside of mouth, too   Beef-Derived Drug Products Diarrhea, Nausea And Vomiting and Other (See Comments)    Alpha-gal   Influenza Vaccines     States her arm had a knot and turned red   Adhesive [Tape] Rash and Other (See Comments)    Also, blisters   Wound Dressing Adhesive Rash and Other (See Comments)    Blisters, too     Outpatient Medications Prior to Visit  Medication Sig Dispense Refill   acetaminophen  (TYLENOL ) 500 MG tablet Take 1,500  mg by mouth 2 (two) times daily as needed for mild pain (pain score 1-3), moderate pain (pain score 4-6), headache or fever.     albuterol  (VENTOLIN  HFA) 108 (90 Base) MCG/ACT inhaler Inhale 1-2 puffs into the lungs every 6 (six) hours as needed for wheezing or shortness of breath. (Patient taking differently: Inhale 1-2 puffs into the lungs every 3 (three) hours as needed for wheezing or shortness of breath.) 6.7 g 0   ALPRAZolam  (XANAX ) 1 MG tablet Take 1 mg by mouth in the morning and at bedtime.     atorvastatin  (LIPITOR) 40 MG tablet Take 1 tablet (40 mg total) by mouth daily. 30 tablet 3   azelastine  (ASTELIN ) 0.1 % nasal spray Place 2 sprays into both nostrils 2 (two) times daily as needed for rhinitis or allergies.     baclofen  (LIORESAL ) 10 MG tablet Take 1 tablet (10 mg total) by mouth 2 (two) times daily as needed (migraine). 60 tablet 5   benzonatate  (TESSALON ) 100 MG capsule Take 1 capsule (100 mg total) by mouth every 8 (eight) hours. 21 capsule 0   Black Cohosh 540 MG CAPS Take 540 mg by mouth at bedtime.     celecoxib  (CELEBREX ) 200 MG capsule Take 200 mg by mouth daily.     clopidogrel  (PLAVIX ) 75 MG tablet Take 75 mg by mouth daily.     diltiazem  (CARDIZEM  CD) 120 MG 24 hr capsule Take 120 mg by mouth daily.     famotidine  (PEPCID ) 40 MG tablet Take 40 mg by mouth 2 (two) times daily.     fluticasone  (FLONASE) 50 MCG/ACT nasal spray Place 2 sprays into both nostrils daily as needed for allergies or rhinitis.     fluticasone -salmeterol (ADVAIR HFA) 115-21 MCG/ACT inhaler Inhale 2 puffs into the lungs 2 (two) times daily. 1 each 12   gabapentin  (NEURONTIN ) 300 MG capsule Take 300 mg by mouth 3 (three) times daily.     levocetirizine (XYZAL ) 5 MG tablet Take 5 mg by mouth every evening.     magnesium  oxide (MAG-OX) 400 (240 Mg) MG tablet Take 400 mg by mouth daily.     Multiple Vitamin (MULTI-VITAMIN) tablet Take 1 tablet by mouth daily with breakfast.  nebivolol  (BYSTOLIC ) 5 MG  tablet Take 5 mg by mouth at bedtime.     pantoprazole  (PROTONIX ) 40 MG tablet Take 40 mg by mouth in the morning and at bedtime.     promethazine  (PHENERGAN ) 25 MG tablet Take 25 mg by mouth daily as needed for nausea or vomiting.     QUEtiapine  Fumarate 150 MG TABS Take 150 mg by mouth at bedtime.     SUMAtriptan  (IMITREX ) 20 MG/ACT nasal spray Place 1 spray (20 mg total) into the nose every 2 (two) hours as needed for migraine or headache. May repeat in 2 hours if headache persists or recurs. 6 each 2   tirzepatide  (MOUNJARO ) 7.5 MG/0.5ML Pen Inject 7.5 mg into the skin every Wednesday.     tirzepatide  (MOUNJARO ) 7.5 MG/0.5ML Pen Inject 7.5 mg into the skin once a week. 6 mL 1   topiramate  (TOPAMAX ) 50 MG tablet TAKE 1 TABLET BY MOUTH TWICE A DAY 180 tablet 1   ustekinumab (STELARA) 90 MG/ML SOSY injection Inject 90 mg into the skin every 3 (three) months.     venlafaxine  XR (EFFEXOR -XR) 150 MG 24 hr capsule Take 150 mg by mouth daily with breakfast.      No facility-administered medications prior to visit.   Review of Systems  Constitutional:  Positive for malaise/fatigue. Negative for chills, fever and weight loss.  HENT:  Negative for congestion, sinus pain and sore throat.   Eyes: Negative.   Respiratory:  Positive for shortness of breath. Negative for cough, hemoptysis, sputum production and wheezing.   Cardiovascular:  Negative for chest pain, palpitations, orthopnea, claudication and leg swelling.  Gastrointestinal:  Positive for heartburn. Negative for abdominal pain, nausea and vomiting.  Genitourinary: Negative.   Musculoskeletal:  Negative for joint pain and myalgias.  Skin:  Negative for rash.  Neurological:  Negative for weakness.  Endo/Heme/Allergies: Negative.   Psychiatric/Behavioral: Negative.     Objective:   Vitals:   09/22/23 1302  BP: 130/87  Pulse: 93  SpO2: 95%  Weight: (!) 350 lb (158.8 kg)  Height: 5' 11 (1.803 m)     Physical Exam Constitutional:       General: She is not in acute distress.    Appearance: Normal appearance. She is obese.   Eyes:     General: No scleral icterus.    Conjunctiva/sclera: Conjunctivae normal.    Cardiovascular:     Rate and Rhythm: Normal rate and regular rhythm.  Pulmonary:     Breath sounds: No wheezing, rhonchi or rales.   Musculoskeletal:     Right lower leg: No edema.     Left lower leg: No edema.   Skin:    General: Skin is warm and dry.   Neurological:     General: No focal deficit present.    CBC    Component Value Date/Time   WBC 15.2 (H) 07/04/2023 1518   RBC 4.35 07/04/2023 1518   HGB 12.6 07/04/2023 1518   HCT 39.5 07/04/2023 1518   PLT 231 07/04/2023 1518   MCV 90.8 07/04/2023 1518   MCH 29.0 07/04/2023 1518   MCHC 31.9 07/04/2023 1518   RDW 15.2 07/04/2023 1518   LYMPHSABS 3.0 07/04/2023 1518   MONOABS 1.0 07/04/2023 1518   EOSABS 0.2 07/04/2023 1518   BASOSABS 0.0 07/04/2023 1518      Latest Ref Rng & Units 07/04/2023    3:18 PM 03/06/2023    5:26 AM 03/05/2023    5:50 AM  BMP  Glucose 70 -  99 mg/dL 91  161  096   BUN 6 - 20 mg/dL 24  18  17    Creatinine 0.44 - 1.00 mg/dL 0.45  4.09  8.11   Sodium 135 - 145 mmol/L 140  140  137   Potassium 3.5 - 5.1 mmol/L 3.6  3.6  3.4   Chloride 98 - 111 mmol/L 108  109  105   CO2 22 - 32 mmol/L 24  23  24    Calcium  8.9 - 10.3 mg/dL 9.1  8.8  8.2    Chest imaging: CTA Chest 07/04/23 1. No embolism to the proximal subsegmental pulmonary artery level. 2. There are diffuse patchy ground-glass opacities as well as linear areas of scarring/atelectasis throughout bilateral lungs. Findings are nonspecific and differential diagnosis includes atypical infection, diffuse pan-bronchiolitis, etc. 3. There are 2 adjacent ground-glass nodules in the right lung lower lobe with largest measuring up to 5 x 7 mm. Please see follow-up recommendations below. 4. Multiple other nonacute observations, as described above.   PFT:     Latest Ref Rng & Units 08/21/2023   10:09 AM  PFT Results  FVC-Pre L 3.08   FVC-Predicted Pre % 70   FVC-Post L 3.13   FVC-Predicted Post % 71   Pre FEV1/FVC % % 77   Post FEV1/FCV % % 82   FEV1-Pre L 2.37   FEV1-Predicted Pre % 69   FEV1-Post L 2.56   DLCO uncorrected ml/min/mmHg 22.66   DLCO UNC% % 87   DLCO corrected ml/min/mmHg 22.66   DLCO COR %Predicted % 87   DLVA Predicted % 122   TLC L 5.45   TLC % Predicted % 89   RV % Predicted % 110     Labs:  Path:  Echo:  Heart Catheterization:     Assessment & Plan:   Arthralgia, unspecified joint  Fatigue, unspecified type  Morbid (severe) obesity due to excess calories (HCC)  Obstructive sleep apnea - Plan: Cpap titration  Discussion: [name] is a 53 year old woman, daily smoker with GERD, hiatal hernia, Barrett's Esophagus, obesity, CVA and hypertension who returns to pulmonary clinic for shortness of breath and sleep apnea.   Sleep apnea Mild sleep apnea with AHI of 14.4 events/hour. Significant fatigue likely due to sleep apnea. Previous CPAP mask intolerance noted. - Order CPAP titration study at [facility]. - Recommend Dreamwear full face mask for CPAP therapy. - Schedule follow-up to review CPAP compliance and troubleshoot mask issues.  GERD with hiatal hernia - she is followed by surgery for hiatal hernia repair - has 3 more dietitian visits  Positive ANA test Positive ANA test with titer of 1:80 and speckled pattern. Other specific autoimmune markers negative. - Referral to rheumatology for further evaluation by primary  Obesity - working with dietitian - planning gastric reduction surgery  Asthma - Continue Advair inhaler, two puffs in the morning and evening. - Continue albuterol  rescue inhaler as needed. - PFTs are within normal limits  Follow up in 3 months  Duaine German, MD [facility] Office: [phone]    Current Outpatient  Medications:    acetaminophen  (TYLENOL ) 500 MG tablet, Take 1,500 mg by mouth 2 (two) times daily as needed for mild pain (pain score 1-3), moderate pain (pain score 4-6), headache or fever., Disp: , Rfl:    albuterol  (VENTOLIN  HFA) 108 (90 Base) MCG/ACT inhaler, Inhale 1-2 puffs into the lungs every 6 (six) hours as needed for wheezing or shortness  of breath. (Patient taking differently: Inhale 1-2 puffs into the lungs every 3 (three) hours as needed for wheezing or shortness of breath.), Disp: 6.7 g, Rfl: 0   ALPRAZolam  (XANAX ) 1 MG tablet, Take 1 mg by mouth in the morning and at bedtime., Disp: , Rfl:    atorvastatin  (LIPITOR) 40 MG tablet, Take 1 tablet (40 mg total) by mouth daily., Disp: 30 tablet, Rfl: 3   azelastine  (ASTELIN ) 0.1 % nasal spray, Place 2 sprays into both nostrils 2 (two) times daily as needed for rhinitis or allergies., Disp: , Rfl:    baclofen  (LIORESAL ) 10 MG tablet, Take 1 tablet (10 mg total) by mouth 2 (two) times daily as needed (migraine)., Disp: 60 tablet, Rfl: 5   benzonatate  (TESSALON ) 100 MG capsule, Take 1 capsule (100 mg total) by mouth every 8 (eight) hours., Disp: 21 capsule, Rfl: 0   Black Cohosh 540 MG CAPS, Take 540 mg by mouth at bedtime., Disp: , Rfl:    celecoxib  (CELEBREX ) 200 MG capsule, Take 200 mg by mouth daily., Disp: , Rfl:    clopidogrel  (PLAVIX ) 75 MG tablet, Take 75 mg by mouth daily., Disp: , Rfl:    diltiazem  (CARDIZEM  CD) 120 MG 24 hr capsule, Take 120 mg by mouth daily., Disp: , Rfl:    famotidine  (PEPCID ) 40 MG tablet, Take 40 mg by mouth 2 (two) times daily., Disp: , Rfl:    fluticasone  (FLONASE) 50 MCG/ACT nasal spray, Place 2 sprays into both nostrils daily as needed for allergies or rhinitis., Disp: , Rfl:    fluticasone -salmeterol (ADVAIR HFA) 115-21 MCG/ACT inhaler, Inhale 2 puffs into the lungs 2 (two) times daily., Disp: 1 each, Rfl: 12   gabapentin  (NEURONTIN ) 300 MG capsule, Take 300 mg by mouth 3 (three) times daily., Disp: , Rfl:     levocetirizine (XYZAL ) 5 MG tablet, Take 5 mg by mouth every evening., Disp: , Rfl:    magnesium  oxide (MAG-OX) 400 (240 Mg) MG tablet, Take 400 mg by mouth daily., Disp: , Rfl:    Multiple Vitamin (MULTI-VITAMIN) tablet, Take 1 tablet by mouth daily with breakfast., Disp: , Rfl:    nebivolol  (BYSTOLIC ) 5 MG tablet, Take 5 mg by mouth at bedtime., Disp: , Rfl:    pantoprazole  (PROTONIX ) 40 MG tablet, Take 40 mg by mouth in the morning and at bedtime., Disp: , Rfl:    promethazine  (PHENERGAN ) 25 MG tablet, Take 25 mg by mouth daily as needed for nausea or vomiting., Disp: , Rfl:    QUEtiapine  Fumarate 150 MG TABS, Take 150 mg by mouth at bedtime., Disp: , Rfl:    SUMAtriptan  (IMITREX ) 20 MG/ACT nasal spray, Place 1 spray (20 mg total) into the nose every 2 (two) hours as needed for migraine or headache. May repeat in 2 hours if headache persists or recurs., Disp: 6 each, Rfl: 2   tirzepatide  (MOUNJARO ) 7.5 MG/0.5ML Pen, Inject 7.5 mg into the skin every Wednesday., Disp: , Rfl:    tirzepatide  (MOUNJARO ) 7.5 MG/0.5ML Pen, Inject 7.5 mg into the skin once a week., Disp: 6 mL, Rfl: 1   topiramate  (TOPAMAX ) 50 MG tablet, TAKE 1 TABLET BY MOUTH TWICE A DAY, Disp: 180 tablet, Rfl: 1   ustekinumab (STELARA) 90 MG/ML SOSY injection, Inject 90 mg into the skin every 3 (three) months., Disp: , Rfl:    venlafaxine  XR (EFFEXOR -XR) 150 MG 24 hr capsule, Take 150 mg by mouth daily with breakfast. , Disp: , Rfl:

## 2023-09-23 ENCOUNTER — Encounter: Payer: Self-pay | Admitting: Pulmonary Disease

## 2023-10-13 ENCOUNTER — Encounter: Attending: General Surgery | Admitting: Dietician

## 2023-10-13 ENCOUNTER — Encounter: Payer: Self-pay | Admitting: Dietician

## 2023-10-13 DIAGNOSIS — E669 Obesity, unspecified: Secondary | ICD-10-CM | POA: Insufficient documentation

## 2023-10-13 DIAGNOSIS — K449 Diaphragmatic hernia without obstruction or gangrene: Secondary | ICD-10-CM | POA: Diagnosis not present

## 2023-10-13 DIAGNOSIS — K219 Gastro-esophageal reflux disease without esophagitis: Secondary | ICD-10-CM | POA: Insufficient documentation

## 2023-10-13 DIAGNOSIS — Z713 Dietary counseling and surveillance: Secondary | ICD-10-CM | POA: Diagnosis not present

## 2023-10-13 NOTE — Progress Notes (Signed)
 Supervised Weight Loss Visit Bariatric Nutrition Education Appt Start Time: 1157    End Time: 1216  Planned surgery: RYGB Pt expectation of surgery: to be about 180-200; relief from hiatal hernia  Referral stated Supervised Weight Loss (SWL) visits needed: 6 months  4 out of 6 SWL Appointments   Virtual Visit via Video Note  I connected with [name] on 10/13/23 at  2:00 PM EDT virtually and verified that I am speaking with the correct person using two identifiers.  NUTRITION ASSESSMENT   Anthropometrics  Start weight at [facility]: 347.6 lbs (date: 07/02/2023)  Height: 71 in Weight today:  lbs BMI:  kg/m2     Clinical   Pharmacotherapy: History of weight loss medication used: phentermine, Mounjaro   Medical hx: obesity, stroke, asthma, HTN, T2DM, GERD, hiatal hernia, gastric band placement and removal, barrett's esophagus, Medications: see list  Labs: 03/05/2023: glucose 100, potassium 3.4, calcium  8.2 Notable signs/symptoms: none noted Any previous deficiencies? No  Lifestyle & Dietary Hx  Pt states she is trying to get out and walk more. Pt states she is aiming for 60 grams per day. Pt states she is checking labels to track protein. Pt states she is limited to chicken, [place] and fish. Pt states she has been experimenting with veggie burger patties, stating they were good. Pt states she continues to practice not drinking with her meal, stating that has been a habit since she had the band and had it removed. Pt states MCHC is low from last [facility] labs. Pt states her PCP is going to send her to a rheumatologist   Estimated daily fluid intake: 64+ oz Supplements: multivitamin (women's 50+), biotin , black cohosh, magnesium  (prescribed), folic acid  Current average weekly physical activity: walking daily, average of 30-45 minutes; playing with dogs for 30 minutes twice a day.  24-Hr Dietary Recall First Meal: boiled eggs sliced, 9 grain breaded toasted, butter, sliced  avocado, pepper or yogurt with granola and blueberries Snack: Seeq Protein powder, mixed with water or Orgain pea (plant based) protein with 2% lactaid milk with coffee and ice(iced coffee) Second Meal: chicken or tuna packs, wheat saltines or wrap or [place] and [place] pepperonin and cheese wrap in the toaster oven Snack: Seeq Protien powder mixed with water Third Meal: greek yogurt, with Kind peanut butter granola, with blueberries sometimes or vegan riblet patties or vegan burger or [place] chili (might add black beans to eat), whole wheat saltine crackers Snack: Ritz crackers with cheese or pork rinds Beverages: diet green tea, water, body armour no sugar  Alcoholic beverages per week: 0   Estimated Energy Needs Calories: 1500  NUTRITION DIAGNOSIS  Overweight/obesity ([place]-3.3) related to past poor dietary habits and physical inactivity as evidenced by patient w/ planned RYGB surgery following dietary guidelines for continued weight loss.  NUTRITION INTERVENTION  Nutrition counseling (C-1) and education (E-2) to facilitate bariatric surgery goals. Kept the same goals and discussion. Increasing physical activity in bariatric patients can significantly improve weight loss, cardiorespiratory fitness, and overall health. It's recommended to gradually increase activity levels, starting with short walks and progressing to moderate-intensity exercise, such as brisk walking or cycling. Incorporating strength training and varied activities can also be beneficial.   Tracking protein is extremely important after bariatric surgery for several key reasons: Preserving Lean Muscle Mass: When you lose weight rapidly, your body can break down muscle for energy. Adequate protein intake helps prevent this muscle loss, which is crucial for maintaining your metabolism.    Promoting Satiety: Protein  helps you feel fuller for longer, which can aid in managing your smaller portion sizes and preventing excessive  snacking.    Aiding Healing: Protein is essential for tissue repair and wound healing after surgery.    Supporting Metabolism: Maintaining muscle mass supports a healthy metabolism, making it easier to lose and maintain weight.   Non-starchy vegetables are incredibly important both before and after bariatric surgery due to their unique nutritional profile. Pre-operatively, they are crucial for liver shrinking diets because they are low in calories and carbohydrates but high in fiber and essential nutrients. This helps reduce the size and fat content of the liver, making the surgery safer and easier. Post-operatively, non-starchy vegetables remain vital as they provide crucial vitamins, minerals, and fiber in a very limited caloric package. Their high fiber content aids digestion and helps prevent constipation, a common issue, while their low-calorie density allows patients to feel fuller on smaller portions without consuming excess calories, thereby supporting long-term weight loss and overall health.  Pre-Op Goals Reviewed with the Patient  Pre-Op Goals Progress & New Goals Continue: increase physical activity; take dogs for a walk, daily for 30 minutes minimum; get back to the gym with mother, 3 days per week for 60 minutes Continue: avoid skipping meals; aim for at least 3 meals per day.  Continue: separating fluid from food. Re-engage: track protein; keep a log on a piece of paper; aim for 60 grams per day; aim for a protein with every meal or snack New: increase non-starchy vegetables; aim for 2+ servings per day.  Handouts Provided Include  Personnel officer) Iron content in food list  Learning Style & Readiness for Change Teaching method utilized: Visual & Auditory  Demonstrated degree of understanding via: Teach Back  Readiness Level: preparation Barriers to learning/adherence to lifestyle change: fear of emesis  RD's Notes for next Visit  Patient progress toward chosen goals.  MONITORING &  EVALUATION Dietary intake, weekly physical activity, body weight, and pre-op goals in 1 month.   Next Steps  Patient is to return to [facility] in one month for next SWL visit.

## 2023-11-03 ENCOUNTER — Other Ambulatory Visit: Payer: Self-pay | Admitting: Adult Health

## 2023-11-10 ENCOUNTER — Encounter: Attending: General Surgery | Admitting: Dietician

## 2023-11-10 ENCOUNTER — Encounter: Payer: Self-pay | Admitting: Dietician

## 2023-11-10 VITALS — Ht 71.0 in | Wt 353.0 lb

## 2023-11-10 DIAGNOSIS — E669 Obesity, unspecified: Secondary | ICD-10-CM | POA: Diagnosis present

## 2023-11-10 DIAGNOSIS — Z713 Dietary counseling and surveillance: Secondary | ICD-10-CM | POA: Insufficient documentation

## 2023-11-10 DIAGNOSIS — E119 Type 2 diabetes mellitus without complications: Secondary | ICD-10-CM | POA: Insufficient documentation

## 2023-11-10 DIAGNOSIS — Z6841 Body Mass Index (BMI) 40.0 and over, adult: Secondary | ICD-10-CM | POA: Insufficient documentation

## 2023-11-10 DIAGNOSIS — K449 Diaphragmatic hernia without obstruction or gangrene: Secondary | ICD-10-CM | POA: Diagnosis not present

## 2023-11-10 DIAGNOSIS — K219 Gastro-esophageal reflux disease without esophagitis: Secondary | ICD-10-CM | POA: Insufficient documentation

## 2023-11-10 NOTE — Progress Notes (Signed)
 Supervised Weight Loss Visit Bariatric Nutrition Education Appt Start Time: 1205    End Time: 1235  Planned surgery: RYGB Pt expectation of surgery: to be about 180-200; relief from hiatal hernia  Referral stated Supervised Weight Loss (SWL) visits needed: 6 months  5 out of 6 SWL Appointments   NUTRITION ASSESSMENT   Anthropometrics  Start weight at [facility]: 347.6 lbs (date: 07/02/2023)  Height: 71 in Weight today: 353.0 lbs BMI: 49.23 kg/m2     Clinical   Pharmacotherapy: History of weight loss medication used: phentermine, Mounjaro   Medical hx: obesity, stroke, asthma, HTN, T2DM, GERD, hiatal hernia, gastric band placement and removal, barrett's esophagus, alpha-gal syndrome Medications: see list (also Mounjaro  7.5 mg) Labs: 03/05/2023: glucose 100, potassium 3.4, calcium  8.2 Notable signs/symptoms: none noted Any previous deficiencies? No  Lifestyle & Dietary Hx  Pt states she started taking lasix . Pt states she has an appointment in September with a rheumatologist, stating she had some labs drawn and her PCP referred her to the rheumatologist. Pt states she is getting about 60 grams of protein per day. Pt states she is not skipping meals. Pt states she has always loved greens of any kind. Pt states she is still practicing not drinking with her meals. Pt states she found a mediterranean bowl that is quick, stating it has chick peas and black beans. Pt states some members of her church are getting together walking a supporting each other, stating some of which have had bariatric surgery. Pt states her son lost a lot of weight, stating he meal preps and goes to the gym every day.  Estimated daily fluid intake: 80+ oz Supplements: multivitamin (women's 50+), biotin , black cohosh, magnesium  (prescribed), folic acid  Current average weekly physical activity: walking daily, at least 30 minutes; playing with dogs for 30 minutes twice a day. Daily protein: 60 grams  24-Hr  Dietary Recall First Meal: boiled eggs sliced, 9 grain breaded toasted, butter, sliced avocado, pepper or yogurt with granola and blueberries Snack: Seeq Protein powder, mixed with water or Orgain pea (plant based) protein with 2% lactaid milk with coffee and ice(iced coffee) or nuts or [place] jerky Second Meal: chicken or tuna packs, wheat saltines or wrap or [place] and [place] pepperonin and cheese wrap in the toaster oven Snack: Seeq Protien powder mixed with water Third Meal: greek yogurt, with Kind peanut butter granola, with blueberries sometimes or vegan riblet patties or vegan burger or [place] chili (might add black beans to eat), whole wheat saltine crackers Snack: Ritz crackers with cheese or pork rinds Beverages: water with flavorings (mio or pure), body armour no sugar  Alcoholic beverages per week: 0   Estimated Energy Needs Calories: 1500  NUTRITION DIAGNOSIS  Overweight/obesity ([place]-3.3) related to past poor dietary habits and physical inactivity as evidenced by patient w/ planned RYGB surgery following dietary guidelines for continued weight loss.  NUTRITION INTERVENTION  Nutrition counseling (C-1) and education (E-2) to facilitate bariatric surgery goals. Kept the same goals and discussion. Re-engage physical activity  Increasing physical activity in bariatric patients can significantly improve weight loss, cardiorespiratory fitness, and overall health. It's recommended to gradually increase activity levels, starting with short walks and progressing to moderate-intensity exercise, such as brisk walking or cycling. Incorporating strength training and varied activities can also be beneficial.   Pre-Op Goals Reviewed with the Patient  Pre-Op Goals Progress & New Goals Re-engage: increase physical activity; take dogs for a walk, daily for 30 minutes minimum; get back to the gym with mother,  3 days per week for 60 minutes; join the group at [employer] and on your own Continue:  avoid skipping meals; aim for at least 3 meals per day Continue: separating fluid from food Continue: track protein; keep a log on a piece of paper; aim for 60 grams per day; aim for a protein with every meal or snack Continue: increase non-starchy vegetables; aim for 2+ servings per day  Handouts Provided Include  Bariatric MyPlate  Learning Style & Readiness for Change Teaching method utilized: Visual & Auditory  Demonstrated degree of understanding via: Teach Back  Readiness Level: preparation Barriers to learning/adherence to lifestyle change: fear of emesis  RD's Notes for next Visit  Patient progress toward chosen goals.  MONITORING & EVALUATION Dietary intake, weekly physical activity, body weight, and pre-op goals in 1 month.   Next Steps  Patient is to return to [facility] in one month for next SWL visit.

## 2023-12-01 ENCOUNTER — Telehealth (HOSPITAL_BASED_OUTPATIENT_CLINIC_OR_DEPARTMENT_OTHER): Payer: Self-pay

## 2023-12-01 NOTE — Telephone Encounter (Signed)
 Please have her seen in office.  Thanks, JD

## 2023-12-01 NOTE — Telephone Encounter (Unsigned)
 Copied from CRM #[id number]. Topic: Clinical - Medical Advice >> Nov 30, 2023  4:53 PM Devaughn RAMAN wrote: Reason for CRM: Patient stated she Seen her PCP on Monday and was placed on levosloxacin 500mg  tablet 1 tablet per day for 5 days, patient stated she was diagnosed with double pneumonia, no x-rays done, patient did have bloodwork done prior to appt, patient stated her bloodwork showed high white blood count 13.3. Patient stated she is still tired, cough, and has a headache. Patient stated her PCP did not feel comfortable doing another round of antibiotics and referred patient to follow up with Dr.Dewald.Patient stated she does not want to go to the Emergency Room. Patient would like a follow up callback regarding if she should come in for an appointment or how to proceed.

## 2023-12-02 NOTE — Telephone Encounter (Signed)
 Patient is scheduled with Candis Dandy PA on September 2nd.

## 2023-12-08 ENCOUNTER — Ambulatory Visit (HOSPITAL_BASED_OUTPATIENT_CLINIC_OR_DEPARTMENT_OTHER)

## 2023-12-09 ENCOUNTER — Ambulatory Visit (HOSPITAL_BASED_OUTPATIENT_CLINIC_OR_DEPARTMENT_OTHER)

## 2023-12-09 ENCOUNTER — Encounter (HOSPITAL_BASED_OUTPATIENT_CLINIC_OR_DEPARTMENT_OTHER): Payer: Self-pay

## 2023-12-09 VITALS — BP 117/76 | HR 86 | Temp 97.6°F | Ht 71.0 in | Wt 341.0 lb

## 2023-12-09 DIAGNOSIS — R051 Acute cough: Secondary | ICD-10-CM

## 2023-12-09 MED ORDER — AMOXICILLIN-POT CLAVULANATE 875-125 MG PO TABS: 1.0000 | ORAL_TABLET | Freq: Two times a day (BID) | ORAL | 0 refills | Status: DC

## 2023-12-09 NOTE — Progress Notes (Signed)
 @Patient  ID: Clotilda GORMAN Arts, female    DOB: 06/25/1970, 53 y.o.   MRN: [id number]  Chief Complaint  Patient presents with   Follow-up    Referring provider: Nena Rosina CROME, PA-C  HPI: Clotilda CANDIE Arts is a 53 y/o female with PMH of CVA, aspiration pneumonia, and morbid obesity who presents as a follow up for possible pneumonia.  She was seen by her PCP about a week ago and diagnosed with pneumonia and treated with Levaquin 500 mg x5 days and a dose of Solumedrol IM.  She notes a history of aspiration pneumonia.  She reports a persistent productive cough of green sputum, sometimes white/clear.  She reports fatigue and chills; she has not taken her temperature at home.  She feels that she is not quite back to her baseline.  She indicates that she is currently undergoing workup/risk stratification for an upcoming hiatal hernia repair with gastric bypass (Dr. Shyrl and Dr. Stevie to perform the surgery).  She denies chest pain, HA, nausea, or other c/o.    TEST/EVENTS : Tx with Levaquin for pneumonia starting on 11/23/2023  Allergies  Allergen Reactions   Haemophilus B Polysaccharide Vaccine Swelling and Other (See Comments)    Redness at injection site (approximatly golf ball size) hot to the touch   Lisinopril  Hives   Lisinopril -Hydrochlorothiazide  Hives and Other (See Comments)    Welts, too   Wound Dressings Hives   Alpha-Gal Diarrhea, Nausea And Vomiting and Other (See Comments)    Blisters inside of mouth, too   Beef-Derived Drug Products Diarrhea, Nausea And Vomiting and Other (See Comments)    Alpha-gal   Influenza Vaccines     States her arm had a knot and turned red   Adhesive [Tape] Rash and Other (See Comments)    Also, blisters   Wound Dressing Adhesive Rash and Other (See Comments)    Blisters, too    Immunization History  Administered Date(s) Administered   Tdap 09/12/2011    Past Medical History:  Diagnosis Date   Anxiety    Arthritis     joints   Asthma    Endometriosis    Fat necrosis of breast    right   GERD (gastroesophageal reflux disease)    History of hiatal hernia    s/p repair few times   History of suicide attempt 03/2017   Hypertension    MDD (major depressive disorder)    Panniculitis    Pneumonia 10/2018   in hospital with pneumonia   PONV (postoperative nausea and vomiting)    Psoriasis    treated with Humeria    Tobacco History: Social History   Tobacco Use  Smoking Status Every Day   Current packs/day: 0.00   Average packs/day: 1 pack/day for 20.0 years (20.0 ttl pk-yrs)   Types: Cigarettes   Start date: 05/20/1995   Last attempt to quit: 05/20/2015   Years since quitting: 8.5  Smokeless Tobacco Never  Tobacco Comments   < 1 pack   Ready to quit: Not Answered Counseling given: Not Answered Tobacco comments: < 1 pack   Outpatient Medications Prior to Visit  Medication Sig Dispense Refill   acetaminophen  (TYLENOL ) 500 MG tablet Take 1,500 mg by mouth 2 (two) times daily as needed for mild pain (pain score 1-3), moderate pain (pain score 4-6), headache or fever.     albuterol  (VENTOLIN  HFA) 108 (90 Base) MCG/ACT inhaler Inhale 1-2 puffs into the lungs every 6 (six) hours as needed for wheezing  or shortness of breath. (Patient taking differently: Inhale 1-2 puffs into the lungs every 3 (three) hours as needed for wheezing or shortness of breath.) 6.7 g 0   ALPRAZolam  (XANAX ) 1 MG tablet Take 1 mg by mouth in the morning and at bedtime.     atorvastatin  (LIPITOR) 40 MG tablet Take 1 tablet (40 mg total) by mouth daily. 30 tablet 3   azelastine  (ASTELIN ) 0.1 % nasal spray Place 2 sprays into both nostrils 2 (two) times daily as needed for rhinitis or allergies.     baclofen  (LIORESAL ) 10 MG tablet Take 1 tablet (10 mg total) by mouth 2 (two) times daily as needed (migraine). 60 tablet 5   Black Cohosh 540 MG CAPS Take 540 mg by mouth at bedtime.     celecoxib  (CELEBREX ) 200 MG capsule Take 200  mg by mouth daily.     clopidogrel  (PLAVIX ) 75 MG tablet Take 75 mg by mouth daily.     famotidine  (PEPCID ) 40 MG tablet Take 40 mg by mouth 2 (two) times daily.     fluticasone  (FLONASE) 50 MCG/ACT nasal spray Place 2 sprays into both nostrils daily as needed for allergies or rhinitis.     fluticasone -salmeterol (ADVAIR HFA) 115-21 MCG/ACT inhaler Inhale 2 puffs into the lungs 2 (two) times daily. 1 each 12   gabapentin  (NEURONTIN ) 300 MG capsule Take 300 mg by mouth 3 (three) times daily.     levocetirizine (XYZAL ) 5 MG tablet Take 5 mg by mouth every evening.     magnesium  oxide (MAG-OX) 400 (240 Mg) MG tablet Take 400 mg by mouth daily.     Multiple Vitamin (MULTI-VITAMIN) tablet Take 1 tablet by mouth daily with breakfast.     nebivolol  (BYSTOLIC ) 5 MG tablet Take 5 mg by mouth at bedtime.     pantoprazole  (PROTONIX ) 40 MG tablet Take 40 mg by mouth in the morning and at bedtime.     promethazine  (PHENERGAN ) 25 MG tablet Take 25 mg by mouth daily as needed for nausea or vomiting.     SUMAtriptan  (IMITREX ) 20 MG/ACT nasal spray Place 1 spray (20 mg total) into the nose every 2 (two) hours as needed for migraine or headache. May repeat in 2 hours if headache persists or recurs. 6 each 2   tirzepatide  (MOUNJARO ) 7.5 MG/0.5ML Pen Inject 7.5 mg into the skin once a week. 6 mL 1   topiramate  (TOPAMAX ) 50 MG tablet TAKE 1 TABLET BY MOUTH TWICE A DAY 180 tablet 1   ustekinumab (STELARA) 90 MG/ML SOSY injection Inject 90 mg into the skin every 3 (three) months.     venlafaxine  XR (EFFEXOR -XR) 150 MG 24 hr capsule Take 150 mg by mouth daily with breakfast.      diltiazem  (CARDIZEM  CD) 120 MG 24 hr capsule Take 120 mg by mouth daily. (Patient not taking: Reported on 12/09/2023)     No facility-administered medications prior to visit.     Review of Systems: as per HPI  Constitutional:   No  weight loss, night sweats,  Fevers, chills, fatigue, or  lassitude.  HEENT:   No headaches,  Difficulty  swallowing,  Tooth/dental problems, or  Sore throat,                No sneezing, itching, ear ache, nasal congestion, post nasal drip,   CV:  No chest pain,  Orthopnea, PND, swelling in lower extremities, anasarca, dizziness, palpitations, syncope.   GI  No heartburn, indigestion, abdominal pain, nausea, vomiting,  diarrhea, change in bowel habits, loss of appetite, bloody stools.   Resp: No shortness of breath with exertion or at rest.  No excess mucus, no productive cough,  No non-productive cough,  No coughing up of blood.  No change in color of mucus.  No wheezing.  No chest wall deformity  Skin: no rash or lesions.  GU: no dysuria, change in color of urine, no urgency or frequency.  No flank pain, no hematuria   MS:  No joint pain or swelling.  No decreased range of motion.  No back pain.    Physical Exam  BP 117/76   Pulse 86   Temp 97.6 F (36.4 C) (Oral)   Ht 5' 11 (1.803 m)   Wt (!) 341 lb (154.7 kg)   SpO2 96%   BMI 47.56 kg/m   GEN: A/Ox3; pleasant , NAD, well nourished    HEENT:  [place]/AT,  EACs-clear, TMs-wnl, NOSE-clear, THROAT-clear, no lesions, no postnasal drip or exudate noted.   NECK:  Supple w/ fair ROM; no JVD; normal carotid impulses w/o bruits; no thyromegaly or nodules palpated; no lymphadenopathy.    RESP  Clear  P & A; w/o, wheezes/ rales/ or rhonchi. no accessory muscle use, no dullness to percussion  CARD:  RRR, no m/r/g, no peripheral edema, pulses intact, no cyanosis or clubbing.  GI:   obese, soft & nt; nml bowel sounds; no organomegaly or masses detected.   Musco: Warm bil, no deformities or joint swelling noted.   Neuro: alert, no focal deficits noted.    Skin: Warm, no lesions or rashes    Lab Results:  CBC    Component Value Date/Time   WBC 15.2 (H) 07/04/2023 1518   RBC 4.35 07/04/2023 1518   HGB 12.6 07/04/2023 1518   HCT 39.5 07/04/2023 1518   PLT 231 07/04/2023 1518   MCV 90.8 07/04/2023 1518   MCH 29.0 07/04/2023 1518    MCHC 31.9 07/04/2023 1518   RDW 15.2 07/04/2023 1518   LYMPHSABS 3.0 07/04/2023 1518   MONOABS 1.0 07/04/2023 1518   EOSABS 0.2 07/04/2023 1518   BASOSABS 0.0 07/04/2023 1518    BMET    Component Value Date/Time   NA 140 07/04/2023 1518   K 3.6 07/04/2023 1518   CL 108 07/04/2023 1518   CO2 24 07/04/2023 1518   GLUCOSE 91 07/04/2023 1518   BUN 24 (H) 07/04/2023 1518   CREATININE 0.72 07/04/2023 1518   CALCIUM  9.1 07/04/2023 1518   GFRNONAA >60 07/04/2023 1518   GFRAA >60 02/07/2019 0345    BNP    Component Value Date/Time   BNP 21.3 04/15/2021 1242    ProBNP No results found for: PROBNP  Imaging: No results found.  Administration History     None          Latest Ref Rng & Units 08/21/2023   10:09 AM  PFT Results  FVC-Pre L 3.08   FVC-Predicted Pre % 70   FVC-Post L 3.13   FVC-Predicted Post % 71   Pre FEV1/FVC % % 77   Post FEV1/FCV % % 82   FEV1-Pre L 2.37   FEV1-Predicted Pre % 69   FEV1-Post L 2.56   DLCO uncorrected ml/min/mmHg 22.66   DLCO UNC% % 87   DLCO corrected ml/min/mmHg 22.66   DLCO COR %Predicted % 87   DLVA Predicted % 122   TLC L 5.45   TLC % Predicted % 89   RV % Predicted % 110  No results found for: NITRICOXIDE   Assessment & Plan:   Assessment & Plan Acute cough -  Recent tx for pneumonia with Levaquin and normal exam today -  Possibly bronchitis; 80% of bronchitis is usually viral in nature -  Check Chest XR to r/o pneumonia -  Continue Mucinex  BID, Tylenol  as needed, increase fluid intake and rest -  Augmentin  Rx sent in; to be used if fever, worsening symptoms, positive Chest XR -  Patient agreeable to plan of care.   Return in about 5 weeks (around 01/13/2024).  Candis Dandy, PA-C 12/09/2023

## 2023-12-09 NOTE — Patient Instructions (Addendum)
 Complete Chest XR as ordered.  Continue Mucinex  twice daily and use Tylenol  as needed per box instructions.  Increase fluid intake and rest as able.  If fever or worsening symptoms, start Augmentin ; Rx sent to pharmacy of choice.  Follow up in 4-6 weeks; sooner if new or worsening symptoms.

## 2023-12-17 ENCOUNTER — Encounter: Attending: General Surgery | Admitting: Dietician

## 2023-12-17 ENCOUNTER — Encounter: Payer: Self-pay | Admitting: Dietician

## 2023-12-17 VITALS — Ht 71.0 in | Wt 343.8 lb

## 2023-12-17 DIAGNOSIS — E669 Obesity, unspecified: Secondary | ICD-10-CM | POA: Insufficient documentation

## 2023-12-17 DIAGNOSIS — Z6841 Body Mass Index (BMI) 40.0 and over, adult: Secondary | ICD-10-CM | POA: Insufficient documentation

## 2023-12-17 DIAGNOSIS — Z713 Dietary counseling and surveillance: Secondary | ICD-10-CM | POA: Insufficient documentation

## 2023-12-17 DIAGNOSIS — K219 Gastro-esophageal reflux disease without esophagitis: Secondary | ICD-10-CM | POA: Insufficient documentation

## 2023-12-17 DIAGNOSIS — E119 Type 2 diabetes mellitus without complications: Secondary | ICD-10-CM | POA: Insufficient documentation

## 2023-12-17 DIAGNOSIS — K449 Diaphragmatic hernia without obstruction or gangrene: Secondary | ICD-10-CM | POA: Diagnosis present

## 2023-12-17 NOTE — Progress Notes (Signed)
 Supervised Weight Loss Visit Bariatric Nutrition Education Appt Start Time: 1203    End Time: 1221  Planned surgery: RYGB Pt expectation of surgery: to be about 180-200; relief from hiatal hernia  Referral stated Supervised Weight Loss (SWL) visits needed: 6 months  6 out of 6 SWL Appointments   Pt completed visits.   Pt has cleared nutrition requirements.   NUTRITION ASSESSMENT   Anthropometrics  Start weight at [facility]: 347.6 lbs (date: 07/02/2023)  Height: 71 in Weight today: 343.8 lbs BMI: 47.95 kg/m2     Clinical   Pharmacotherapy: History of weight loss medication used: phentermine, Mounjaro   Medical hx: obesity, stroke, asthma, HTN, T2DM, GERD, hiatal hernia, gastric band placement and removal, barrett's esophagus, alpha-gal syndrome Medications: see list (also Mounjaro  7.5 mg) Labs: 03/05/2023: glucose 100, potassium 3.4, calcium  8.2 Notable signs/symptoms: none noted Any previous deficiencies? No  Lifestyle & Dietary Hx  Pt states she has been sick with pneumonia again, stating she is on an antibiotic. Pt states she is still aiming for 60 grams of protein per day. Pt states she has a small divided plate. Pt state she is doing well meal planning and prepping, stating she found some great choices at [facility]. Pt states she has done well eating more leafy greens, stating she does well with spinach. Pt states she could do better increasing other vegetables, stating getting more variety. Pt states since she has been sick, she has not had as much physical activity. Pt states she will be able to get back to physical activity, stating she has started picking it back up. Pt states she is looking forward to the hiatal hernia repair for relief from the reflux.  Estimated daily fluid intake: 80+ oz Supplements: multivitamin (women's 50+), biotin , black cohosh, magnesium  (prescribed), folic acid  Current average weekly physical activity: walking daily, at least 30 minutes; playing  with dogs for 30 minutes twice a day. Daily protein: 60 grams  24-Hr Dietary Recall First Meal: boiled eggs sliced, 9 grain breaded toasted, butter, sliced avocado, pepper or yogurt with granola and blueberries Snack: Seeq Protein powder, mixed with water or Orgain pea (plant based) protein with 2% lactaid milk with coffee and ice(iced coffee) or nuts or [place] jerky Second Meal: chicken or tuna packs, wheat saltines or wrap or [place] and [place] pepperonin and cheese wrap in the toaster oven Snack: Seeq Protien powder mixed with water Third Meal: greek yogurt, with Kind peanut butter granola, with blueberries sometimes or vegan riblet patties or vegan burger or [place] chili (might add black beans to eat), whole wheat saltine crackers Snack: Ritz crackers with cheese or pork rinds Beverages: water with flavorings (mio or pure), body armour no sugar  Estimated Energy Needs Calories: 1500  NUTRITION DIAGNOSIS  Overweight/obesity ([place]-3.3) related to past poor dietary habits and physical inactivity as evidenced by patient w/ planned RYGB surgery following dietary guidelines for continued weight loss.  NUTRITION INTERVENTION  Nutrition counseling (C-1) and education (E-2) to facilitate bariatric surgery goals.  Pre-Op Goals Reviewed with the Patient  Pre-Op Goals Progress & New Goals Continue: increase physical activity; take dogs for a walk, daily for 30 minutes minimum; get back to the gym with mother, 3 days per week for 60 minutes; join the group at the church and on your own Continue: avoid skipping meals; aim for at least 3 meals per day Continue: separating fluid from food Continue: track protein; keep a log on a piece of paper; aim for 60 grams per day; aim  for a protein with every meal or snack Continue: increase non-starchy vegetables; aim for 2+ servings per day  Handouts Provided Include  Bariatric MyPlate  Learning Style & Readiness for Change Teaching method utilized: Visual  & Auditory  Demonstrated degree of understanding via: Teach Back  Readiness Level: preparation Barriers to learning/adherence to lifestyle change: fear of emesis  RD's Notes for next Visit  Patient progress toward chosen goals.  MONITORING & EVALUATION Dietary intake, weekly physical activity, body weight, and pre-op goals in 1 month.   Next Steps  Pt has completed visits. No further supervised visits required/recommended. Patient is to return to [facility] for pre-op class >2 week prior to scheduled surgery.

## 2023-12-20 ENCOUNTER — Ambulatory Visit (HOSPITAL_BASED_OUTPATIENT_CLINIC_OR_DEPARTMENT_OTHER): Attending: Pulmonary Disease | Admitting: Pulmonary Disease

## 2023-12-20 DIAGNOSIS — G4734 Idiopathic sleep related nonobstructive alveolar hypoventilation: Secondary | ICD-10-CM | POA: Insufficient documentation

## 2023-12-20 DIAGNOSIS — G4733 Obstructive sleep apnea (adult) (pediatric): Secondary | ICD-10-CM | POA: Diagnosis not present

## 2023-12-22 ENCOUNTER — Ambulatory Visit: Payer: Self-pay

## 2023-12-23 NOTE — Procedures (Signed)
 [facility] [address] [place], [place] [address] Tel: [phone]   Fax: [phone]  Titration Interpretation  Patient Name:  [name], [name] Date:  12/20/2023 Referring Physician:  DORN CHILL [phone]) %%startinterp%% Indications for Polysomnography The patient is a 53 year-old Female who is 5' 11 and weighs 341.0 lbs. Her BMI equals 47.7.  A full night titration treatment study was performed.  Medication  Seroquel    Xanax   Black Cohosh   Protonix   Famotadine  Xyzal    Gabapentin   Nebivolol     Polysomnogram Data A full night polysomnogram recorded the standard physiologic parameters including EEG, EOG, EMG, EKG, nasal and oral airflow.  Respiratory parameters of chest and abdominal movements were recorded with Respiratory Inductance Plethysmography belts.  Oxygen saturation was recorded by pulse oximetry.   Sleep Architecture The total recording time of the polysomnogram was 389.3 minutes.  The total sleep time was 353.5 minutes.  The patient spent 1.3% of total sleep time in Stage N1, 52.3% in Stage N2, 24.2% in Stages N3, and 22.2% in REM.  Sleep latency was 25.9 minutes.  REM latency was 135.0 minutes.  Sleep Efficiency was 90.8%.  Wake after Sleep Onset time was 9.5 minutes.  Titration Summary The patient was titrated at pressures ranging from 5* cm/H20 up to 16 cm, then transitioned to Bilevel upto 22/18/0** cm/H20 with.The last pressure used in the study was 22/18/0** cm/H20.  Respiratory Events The polysomnogram revealed a presence of 1 obstructive, 14 central, and 1 mixed apneas resulting in an Apnea index of 2.7 events per hour.  There were 77 hypopneas (>=3% desaturation and/or arousal) resulting in an Apnea\Hypopnea Index (AHI >=3% desaturation and/or arousal) of 15.8 events per hour.  There were 43 hypopneas (>=4% desaturation) resulting in an Apnea\Hypopnea Index (AHI >=4% desaturation) of 10.0 events per hour.   There were 2 Respiratory Effort Related Arousals resulting in a RERA index of 0.3 events per hour. The Respiratory Disturbance Index is 16.1 events per hour.  The snore index was 224.9 events per hour.  Mean oxygen saturation was 90.1%.  The lowest oxygen saturation during sleep was 84.0%.  Time spent <=88% oxygen saturation was 127.2 minutes (32.7%).  Limb Activity There were - limb movements recorded.  Of this total, - were classified as PLMs.  Of the PLMs, - were associated with arousals.  The Limb Movement index was - per hour while the PLM index was - per hour.  Cardiac Summary The average pulse rate was 81.9 bpm.  The minimum pulse rate was 67.0 bpm while the maximum pulse rate was 93.0 bpm.  Cardiac rhythm was normal/abnormal.  Comments: Bilevel was tried due to inability to tolerate high pressures on CPAP. Best results on bilevel 20/16, central apneas emerged above this pressure The patient was fitted and desensitized for a under the nose style full face mask. There were a few masks tried but the patient has a very petite nose. The best fit and seal mask ended up being the Fisher-Paykel Full Face under the nose Evora XSmall mask.  A chin strap was applied half way through the study due to chin falling out of mask  Diagnosis:  OSA with nocturnal hypoxia corrected by BiPAP  Recommendations: Trial of BiPAP 20/16 cm with F& P Evora X small full face mask Alternbatively, trial auto bilevel with EPAP min 10, PS +4, IPAP max 20 cm Review compliance download at this lvel & adjust settings further if needed Close clinical follow up  with compliance monitoring to optimize therapeutic efficiency Weight loss measures encouraged She should be cautioned against driving when sleepy & against medications with sedative side effects.   This study was personally reviewed and electronically signed by: Harden Staff, MD Accredited Board Certified in Sleep Medicine

## 2023-12-27 ENCOUNTER — Telehealth: Payer: Self-pay | Admitting: Pulmonary Disease

## 2023-12-27 NOTE — Telephone Encounter (Signed)
 Bipap titration study recommendations:  Trial of BiPAP 20/16 cm with F& P Evora X small full face mask   Please send in DME orders for bipap machine and mask with the above settings.  Thanks, JD

## 2023-12-28 ENCOUNTER — Other Ambulatory Visit: Payer: Self-pay

## 2023-12-28 DIAGNOSIS — R0683 Snoring: Secondary | ICD-10-CM

## 2023-12-28 NOTE — Telephone Encounter (Signed)
 Spoke w/ patient got  DME and order has been placed    -NFN

## 2024-01-04 ENCOUNTER — Telehealth (HOSPITAL_BASED_OUTPATIENT_CLINIC_OR_DEPARTMENT_OTHER): Payer: Self-pay

## 2024-01-04 NOTE — Telephone Encounter (Signed)
 Copied from CRM #[id number]. Topic: Clinical - Order For Equipment >> Jan 04, 2024  2:17 PM Devaughn RAMAN wrote: Reason for CRM: Patient called regarding her Bipap machine order. Patient stated [facility] is out of network with her insurance and she would have to private pay. Patient would like a new order to be sent to be sent to [facility] and they are in network with her insurance. Patient stated she contacted [facility] and they advised her they are in network and has what she needs, they need a copy of her sleep study and a new order for her Bipap machine to be sent to [facility] below.  [facility] [address] [place], [place] [address]  [phone]

## 2024-01-04 NOTE — Telephone Encounter (Signed)
 Resent to [facility] per requested. NFN

## 2024-01-07 ENCOUNTER — Other Ambulatory Visit: Payer: Self-pay

## 2024-01-11 MED ORDER — SUMATRIPTAN 20 MG/ACT NA SOLN: 20.0000 mg | NASAL | 6 refills | Status: AC | PRN

## 2024-01-11 NOTE — Telephone Encounter (Signed)
 Last filled by patient on 01/07/24 and 12/05/23 for 6 each  Last office visit : 04/24/23 Next office visit : no appointment on file - was due back 10/22/23   Please advise is refill appropriate. Patient has been notified that she needs to make an appointment via mychart.

## 2024-01-13 ENCOUNTER — Ambulatory Visit (HOSPITAL_BASED_OUTPATIENT_CLINIC_OR_DEPARTMENT_OTHER)

## 2024-01-15 ENCOUNTER — Telehealth: Payer: Self-pay

## 2024-01-15 NOTE — Telephone Encounter (Signed)
 Copied from CRM #[id number]. Topic: General - Other >> Jan 11, 2024  1:07 PM Essie A wrote: Reason for CRM: Jon from [facility] along with the patient called to get a message to Dr. Kara regarding a prior authorization for a Bipap machine.  [employer] would like for Dr. Kara to  submit a prior authorization so they can approve it.  Fax number is [phone]/phone [phone].  Please call patient to let her know that this has been done by calling [phone].  Thanks.   Called and spoke with the pt and advised DME handles prior auth.  Pt states she has reached out to DME and it has been handled, prior shara has been taken care of.  Pt had no other concerns.  Nfn

## 2024-01-18 ENCOUNTER — Other Ambulatory Visit (HOSPITAL_BASED_OUTPATIENT_CLINIC_OR_DEPARTMENT_OTHER): Payer: Self-pay

## 2024-01-18 MED ORDER — MOUNJARO 10 MG/0.5ML ~~LOC~~ SOAJ
10.0000 mg | SUBCUTANEOUS | 1 refills | Status: DC
  Filled 2024-01-18: qty 6, 84d supply, fill #0

## 2024-02-01 ENCOUNTER — Encounter: Payer: Self-pay | Admitting: *Deleted

## 2024-02-01 ENCOUNTER — Other Ambulatory Visit: Payer: Self-pay | Admitting: *Deleted

## 2024-02-01 DIAGNOSIS — K449 Diaphragmatic hernia without obstruction or gangrene: Secondary | ICD-10-CM

## 2024-02-08 ENCOUNTER — Encounter: Attending: General Surgery | Admitting: Skilled Nursing Facility1

## 2024-02-09 ENCOUNTER — Encounter: Payer: Self-pay | Admitting: Skilled Nursing Facility1

## 2024-02-09 NOTE — Progress Notes (Signed)
 Pre-Operative Nutrition Class:    Patient was seen on 02/08/2024 for Pre-Operative Bariatric Surgery Education at [facility]    Surgery date:  Surgery type: previously band removed now getting RYGB with stomach correction and hiatal hernia repair including adhesions already present  Start weight at [facility]: 347.6 Weight today: 346  Samples given per MNT protocol. Patient educated on appropriate usage: Celebrate Vitamins Multivitamin Lot # [id number] Exp: 09/26  Celebrate Vitamins Calcium   Lot # [id number] Exp: 10/26  Ensure Protein Shake Lot # [id number] Exp: May 08, 2024   The following the learning objectives were met by the patient during this course: Identify Pre-Op Dietary Goals and will begin 2 weeks pre-operatively Identify appropriate sources of fluids and proteins  State protein recommendations and appropriate sources pre and post-operatively Identify Post-Operative Dietary Goals and will follow for 2 weeks post-operatively Identify appropriate multivitamin, calcium , and thiamin sources Describe the need for physical activity post-operatively and will follow MD recommendations State when to call healthcare provider regarding medication questions or post-operative complications When having a diagnosis of diabetes understanding hypoglycemia symptoms and the inclusion of 1 complex carbohydrate per meal  Handouts given during class include: Pre-Op Bariatric Surgery Diet Handout Protein Shake Handout Post-Op Bariatric Surgery Nutrition Handout BELT Program Information Flyer [employer] Information Flyer [facility] Bariatric Supplements Price List  Follow-Up Plan: Patient will follow-up at [facility] 2 weeks post operatively for diet advancement per MD.

## 2024-02-17 ENCOUNTER — Other Ambulatory Visit (HOSPITAL_BASED_OUTPATIENT_CLINIC_OR_DEPARTMENT_OTHER): Payer: Self-pay

## 2024-02-17 MED ORDER — CHLORHEXIDINE GLUCONATE 0.12 % MT SOLN
15.0000 mL | OROMUCOSAL | 0 refills | Status: DC | PRN
  Filled 2024-02-17: qty 473, 30d supply, fill #0
  Filled 2024-03-30: qty 473, 30d supply, fill #1

## 2024-02-22 ENCOUNTER — Ambulatory Visit: Payer: Self-pay | Admitting: General Surgery

## 2024-02-22 DIAGNOSIS — K449 Diaphragmatic hernia without obstruction or gangrene: Secondary | ICD-10-CM

## 2024-03-11 ENCOUNTER — Ambulatory Visit
Attending: Thoracic Surgery (Cardiothoracic Vascular Surgery) | Admitting: Thoracic Surgery (Cardiothoracic Vascular Surgery)

## 2024-03-11 ENCOUNTER — Ambulatory Visit: Admitting: Thoracic Surgery (Cardiothoracic Vascular Surgery)

## 2024-03-11 ENCOUNTER — Encounter: Payer: Self-pay | Admitting: Thoracic Surgery (Cardiothoracic Vascular Surgery)

## 2024-03-11 VITALS — BP 124/81 | HR 88 | Resp 20 | Ht 71.0 in | Wt 356.5 lb

## 2024-03-11 DIAGNOSIS — K449 Diaphragmatic hernia without obstruction or gangrene: Secondary | ICD-10-CM | POA: Diagnosis not present

## 2024-03-11 NOTE — H&P (View-Only) (Signed)
 [address]       [place] [address]             [phone]                    [name] [facility] Medical Record #[id number] Date of Birth: Feb 18, 1971  Referring: Lindaann Dunnings, MD Primary Care: Nena Rosina CROME, [place] Primary Cardiologist: Lynwood Schilling, MD  Chief Complaint:    Chief Complaint  Patient presents with   Follow-up    Further discuss surgery, hernia    History of Present Illness:    [name] 53 y.o. female presents for surgical evaluation of a multiple time recurrent large hiatal hernia.  Her first operation was in 2006 and was done in conjunction with the Lap-Band placement.  The Lap-Band ultimately was removed.  She has since had several other hiatal failed repairs.  She is also had multiple wound complications from other surgeries, including a panniculectomy.  She currently is very symptomatic with dysphagia and occasional odynophagia.  She has also had multiple recurrent pneumonias likely due to aspiration.  She has been evaluated by our general surgery colleagues, and was referred to [facility] thoracic surgery, who also referred her for evaluation by bariatric surgery.  She comes back today to discuss plan combined surgery between myself and bariatric surgery.  She continues to be symptomatic with documentation sleeping.      Zubrod Score: At the time of surgery this patient's most appropriate activity status/level should be described as: []     0    Normal activity, no symptoms [x]     1    Restricted in physical strenuous activity but ambulatory, able to do out light work []     2    Ambulatory and capable of self care, unable to do work activities, up and about               >50 % of waking hours                              []     3    Only limited self care, in bed greater than 50% of waking hours []     4    Completely disabled, no self care, confined to bed or chair []     5    Moribund   Past Medical History:   Diagnosis Date   Anxiety    Arthritis    joints   Asthma    Endometriosis    Fat necrosis of breast    right   GERD (gastroesophageal reflux disease)    History of hiatal hernia    s/p repair few times   History of suicide attempt 03/2017   Hypertension    MDD (major depressive disorder)    Panniculitis    Pneumonia 10/2018   in hospital with pneumonia   PONV (postoperative nausea and vomiting)    Psoriasis    treated with Humeria    Past Surgical History:  Procedure Laterality Date   ACHILLES TENDON REPAIR Left 01-11-2015  @[facility]    BREAST CYST EXCISION Right 01/24/2019   Procedure: EXCISIN OF RIGHT BREAST FAT NECROSIS;  Surgeon: Lowery Estefana GORMAN, DO;  Location: [facility];  Service: Plastics;  Laterality: Right;   BREAST REDUCTION SURGERY Bilateral 01/09/2016   Procedure: MAMMARY REDUCTION  (BREAST) BILATERAL;  Surgeon: Estefana GORMAN Dillingham, DO;  Location: [facility]  ;  Service: Plastics;  Laterality: Bilateral;   CARPOMETACARPAL (CMC) FUSION OF THUMB Right 05-04-2015   @[facility]    COMBINED HYSTEROSCOPY DIAGNOSTIC / D&C  06-23-2003     dr austin @[facility]    D & C HYSTERSCOPY /  DX LAPAROSCOPY CONVERSION LAPAROTOMY LYSIS ADHESIONS WITH RIGHT SALPINECTOMY  12-26-2003    dr nikki  @[facility]    DEBRIDEMENT AND CLOSURE WOUND N/A 02/07/2019   Procedure: DEBRIDEMENT OF ABDOMINAL WOUND, PLACEMENT OF A-CELL, PLACEMENT OF WOUND VAC;  Surgeon: Lowery Estefana RAMAN, DO;  Location: [facility];  Service: Plastics;  Laterality: N/A;   INCISION AND DRAINAGE OF WOUND Right 06/22/2019   Procedure: Excision of abdominal wound with closure;  Surgeon: Lowery Estefana RAMAN, DO;  Location: [facility];  Service: Plastics;  Laterality: Right;  Total case time is 1 hour   LAPAROSCOPIC CHOLECYSTECTOMY  10-19-2002   dr effie  @[facility]    LAPAROSCOPIC GASTRIC BANDING  09-25-2003  @[facility]    WITH HIATAL HERNIA REPAIR   LAPAROSCOPIC LYSIS OF ADHESIONS N/A 01/24/2019   Procedure: LAPAROSCOPY WITH LYSIS OF  ADHESIONS;  Surgeon: Gladis Cough, MD;  Location: [facility];  Service: General;  Laterality: N/A;   LAPAROSCOPIC REPAIR AND REMOVAL OF GASTRIC BAND  02-06-2006  @[facility]    LAPAROTOMY LYSIS ADHESIONS , UTERINE BIOPSY  01-31-2000   dr lowe  @[facility]    OPEN WOUND DEBRIDEMENT ANTERIOR ABDOMINAL WALL , REMOVAL FORGEIN BODY'S  05-10-2001   dr effie  @[facility]    PANNICULECTOMY N/A 01/24/2019   Procedure: PANNICULECTOMY;  Surgeon: Lowery Estefana RAMAN, DO;  Location: [facility];  Service: Plastics;  Laterality: N/A;   REPAIR RECURRENT  HIATAL HERNIA   04-23-2004  @[facility]    TAKEDOWN RECURRENT HIATAL HERNIA FROM PREVIOUS MESH REPAIR OF DIAPHRAGM/ REPAIR HIATAL HERNIA AND THE GASTROPEXY  11/05/2004   TONSILLECTOMY  age 73   TOTAL ABDOMINAL HYSTERECTOMY W/ BILATERAL SALPINGOOPHORECTOMY  12-23-2005   dr soper   W/  BILATERAL URETEROLYSIS AND EXTENSIVE ABD. LYSIS ADHESIONS   UMBILICAL HERNIA REPAIR  12/27/2009   LAPAROSCOPY TAKEDOWN INCARCERATED UMBILICAL HERNIA WITH REPAIR/ OPEN REPAIR COMPLEX LOWER INCISIONAL HERNIA   VAGOTOMY  2006    Family History  Problem Relation Age of Onset   Cancer Maternal Aunt        melanoma   Diabetes Father    Heart attack Father 75     Social History   Tobacco Use  Smoking Status Every Day   Current packs/day: 0.00   Average packs/day: 1 pack/day for 20.0 years (20.0 ttl pk-yrs)   Types: Cigarettes   Start date: 05/20/1995   Last attempt to quit: 05/20/2015   Years since quitting: 8.8  Smokeless Tobacco Never  Tobacco Comments   < 1 pack    Social History   Substance and Sexual Activity  Alcohol Use Yes   Comment: socially     Allergies  Allergen Reactions   Haemophilus B Polysaccharide Vaccine Swelling and Other (See Comments)    Redness at injection site (approximatly golf ball size) hot to the touch   Lisinopril  Hives   Lisinopril -Hydrochlorothiazide  Hives and Other (See Comments)    Welts, too   Wound Dressings Hives   Alpha-Gal Diarrhea, Nausea And Vomiting and  Other (See Comments)    Blisters inside of mouth, too   Bovine (Beef) Protein-Containing Drug Products Diarrhea, Nausea And Vomiting and Other (See Comments)    Alpha-gal   Influenza Vaccines     States her arm had a knot and turned red  Adhesive [Tape] Rash and Other (See Comments)    Also, blisters   Wound Dressing Adhesive Rash and Other (See Comments)    Blisters, too    Current Outpatient Medications  Medication Sig Dispense Refill   acetaminophen  (TYLENOL ) 500 MG tablet Take 1,500 mg by mouth 2 (two) times daily as needed for mild pain (pain score 1-3), moderate pain (pain score 4-6), headache or fever.     albuterol  (VENTOLIN  HFA) 108 (90 Base) MCG/ACT inhaler Inhale 1-2 puffs into the lungs every 6 (six) hours as needed for wheezing or shortness of breath. (Patient taking differently: Inhale 1-2 puffs into the lungs every 3 (three) hours as needed for wheezing or shortness of breath.) 6.7 g 0   ALPRAZolam  (XANAX ) 1 MG tablet Take 1 mg by mouth in the morning and at bedtime.     atorvastatin  (LIPITOR) 40 MG tablet Take 1 tablet (40 mg total) by mouth daily. 30 tablet 3   azelastine  (ASTELIN ) 0.1 % nasal spray Place 2 sprays into both nostrils 2 (two) times daily as needed for rhinitis or allergies.     baclofen  (LIORESAL ) 10 MG tablet Take 1 tablet (10 mg total) by mouth 2 (two) times daily as needed (migraine). 60 tablet 5   Black Cohosh 540 MG CAPS Take 540 mg by mouth at bedtime.     celecoxib  (CELEBREX ) 200 MG capsule Take 200 mg by mouth daily.     chlorhexidine  (PERIDEX ) 0.12 % solution Use as directed 15 mLs in the mouth or throat as needed for wound care for up to 14 days. 480 mL 0   diltiazem  (CARDIZEM  CD) 120 MG 24 hr capsule Take 120 mg by mouth daily.     famotidine  (PEPCID ) 40 MG tablet Take 40 mg by mouth 2 (two) times daily.     fluticasone  (FLONASE) 50 MCG/ACT nasal spray Place 2 sprays into both nostrils daily as needed for allergies or rhinitis.      fluticasone -salmeterol (ADVAIR HFA) 115-21 MCG/ACT inhaler Inhale 2 puffs into the lungs 2 (two) times daily. 1 each 12   gabapentin  (NEURONTIN ) 300 MG capsule Take 300 mg by mouth 3 (three) times daily.     levocetirizine (XYZAL ) 5 MG tablet Take 5 mg by mouth every evening.     magnesium  oxide (MAG-OX) 400 (240 Mg) MG tablet Take 400 mg by mouth daily.     Multiple Vitamin (MULTI-VITAMIN) tablet Take 1 tablet by mouth daily with breakfast.     nebivolol  (BYSTOLIC ) 5 MG tablet Take 5 mg by mouth at bedtime.     pantoprazole  (PROTONIX ) 40 MG tablet Take 40 mg by mouth in the morning and at bedtime.     promethazine  (PHENERGAN ) 25 MG tablet Take 25 mg by mouth daily as needed for nausea or vomiting.     SUMAtriptan  (IMITREX ) 20 MG/ACT nasal spray Place 1 spray (20 mg total) into the nose as needed for migraine or headache. May repeat in 2 hours if headache persists or recurs. 6 each 6   tirzepatide  (MOUNJARO ) 10 MG/0.5ML Pen Inject 10 mg into the skin every 7 (seven) days. 12 mL 1   topiramate  (TOPAMAX ) 50 MG tablet TAKE 1 TABLET BY MOUTH TWICE A DAY 180 tablet 1   ustekinumab (STELARA) 90 MG/ML SOSY injection Inject 90 mg into the skin every 3 (three) months.     venlafaxine  XR (EFFEXOR -XR) 150 MG 24 hr capsule Take 150 mg by mouth daily with breakfast.      clopidogrel  (PLAVIX )  75 MG tablet Take 75 mg by mouth daily. (Patient not taking: Reported on 03/11/2024)     No current facility-administered medications for this visit.    Review of Systems  Constitutional:  Positive for malaise/fatigue. Negative for weight loss.  Respiratory:  Positive for cough.   Gastrointestinal:  Positive for abdominal pain, heartburn, nausea and vomiting.  Neurological: Negative.   Psychiatric/Behavioral:  Positive for depression and suicidal ideas.      PHYSICAL EXAMINATION: BP 124/81 (BP Location: Left Arm, Patient Position: Sitting, Cuff Size: Large)   Pulse 88   Resp 20   Ht 5' 11 (1.803 m)   Wt (!)  356 lb 8 oz (161.7 kg)   SpO2 94% Comment: RA  BMI 49.72 kg/m  Physical Exam Constitutional:      General: She is not in acute distress.    Appearance: She is not ill-appearing.  HENT:     Head: Normocephalic and atraumatic.  Eyes:     Extraocular Movements: Extraocular movements intact.  Cardiovascular:     Rate and Rhythm: Normal rate.  Pulmonary:     Effort: Pulmonary effort is normal. No respiratory distress.  Musculoskeletal:        General: Normal range of motion.  Skin:    General: Skin is warm and dry.  Neurological:     General: No focal deficit present.     Mental Status: She is alert and oriented to person, place, and time.     Diagnostic Studies & Laboratory data:    CT Scan: CT scan from [facility] was reviewed.  This was also compared to her scan from 2020.  She does have a large hiatal hernia with intrathoracic stomach and fat.      I have independently reviewed the above radiology studies  and reviewed the findings with the patient.   Recent Lab Findings: Lab Results  Component Value Date   WBC 15.2 (H) 07/04/2023   HGB 12.6 07/04/2023   HCT 39.5 07/04/2023   PLT 231 07/04/2023   GLUCOSE 91 07/04/2023   CHOL 149 11/03/2021   TRIG 134 11/03/2021   HDL 44 11/03/2021   LDLCALC 78 11/03/2021   ALT 22 03/04/2023   AST 27 03/04/2023   NA 140 07/04/2023   K 3.6 07/04/2023   CL 108 07/04/2023   CREATININE 0.72 07/04/2023   BUN 24 (H) 07/04/2023   CO2 24 07/04/2023   TSH 1.750 10/18/2018   INR 1.1 10/03/2022   HGBA1C 6.0 (H) 11/03/2021        Assessment / Plan:   53 year old female with a large recurrent hiatal hernia.  She has had multiple hiatal operations which have all failed.  She is also morbidly obese with a BMI of 48.  She is also had wound complications from other surgeries.  She would like to proceed with surgery, and I explained to her that will plan to go into her left and right chest to mobilize her stomach out of her mediastinum.  I  also explained to her that given her previous surgical history, and high likelihood of dense adhesions in her mediastinum, we will not attempt to perform any her work measures for for mobilization.  There is a high likelihood that we will need to abort if it is stuck to the back of the heart, or aorta.   I  spent 60 minutes with the patient face to face counseling and coordination of care.    [name] 03/11/2024 1:47 PM

## 2024-03-11 NOTE — Progress Notes (Signed)
 [address]       [place] [address]             [phone]                    BRICELYN Walter [facility] Medical Record #[id number] Date of Birth: Feb 18, 1971  Referring: Lindaann Dunnings, MD Primary Care: Nena Rosina CROME, [place] Primary Cardiologist: Lynwood Schilling, MD  Chief Complaint:    Chief Complaint  Patient presents with   Follow-up    Further discuss surgery, hernia    History of Present Illness:    [name] 53 y.o. female presents for surgical evaluation of a multiple time recurrent large hiatal hernia.  Her first operation was in 2006 and was done in conjunction with the Lap-Band placement.  The Lap-Band ultimately was removed.  She has since had several other hiatal failed repairs.  She is also had multiple wound complications from other surgeries, including a panniculectomy.  She currently is very symptomatic with dysphagia and occasional odynophagia.  She has also had multiple recurrent pneumonias likely due to aspiration.  She has been evaluated by our general surgery colleagues, and was referred to [facility] thoracic surgery, who also referred her for evaluation by bariatric surgery.  She comes back today to discuss plan combined surgery between myself and bariatric surgery.  She continues to be symptomatic with documentation sleeping.      Zubrod Score: At the time of surgery this patient's most appropriate activity status/level should be described as: []     0    Normal activity, no symptoms [x]     1    Restricted in physical strenuous activity but ambulatory, able to do out light work []     2    Ambulatory and capable of self care, unable to do work activities, up and about               >50 % of waking hours                              []     3    Only limited self care, in bed greater than 50% of waking hours []     4    Completely disabled, no self care, confined to bed or chair []     5    Moribund   Past Medical History:   Diagnosis Date   Anxiety    Arthritis    joints   Asthma    Endometriosis    Fat necrosis of breast    right   GERD (gastroesophageal reflux disease)    History of hiatal hernia    s/p repair few times   History of suicide attempt 03/2017   Hypertension    MDD (major depressive disorder)    Panniculitis    Pneumonia 10/2018   in hospital with pneumonia   PONV (postoperative nausea and vomiting)    Psoriasis    treated with Humeria    Past Surgical History:  Procedure Laterality Date   ACHILLES TENDON REPAIR Left 01-11-2015  @[facility]    BREAST CYST EXCISION Right 01/24/2019   Procedure: EXCISIN OF RIGHT BREAST FAT NECROSIS;  Surgeon: Lowery Estefana GORMAN, DO;  Location: [facility];  Service: Plastics;  Laterality: Right;   BREAST REDUCTION SURGERY Bilateral 01/09/2016   Procedure: MAMMARY REDUCTION  (BREAST) BILATERAL;  Surgeon: Estefana GORMAN Dillingham, DO;  Location: [facility]  ;  Service: Plastics;  Laterality: Bilateral;   CARPOMETACARPAL (CMC) FUSION OF THUMB Right 05-04-2015   @[facility]    COMBINED HYSTEROSCOPY DIAGNOSTIC / D&C  06-23-2003     dr austin @[facility]    D & C HYSTERSCOPY /  DX LAPAROSCOPY CONVERSION LAPAROTOMY LYSIS ADHESIONS WITH RIGHT SALPINECTOMY  12-26-2003    dr nikki  @[facility]    DEBRIDEMENT AND CLOSURE WOUND N/A 02/07/2019   Procedure: DEBRIDEMENT OF ABDOMINAL WOUND, PLACEMENT OF A-CELL, PLACEMENT OF WOUND VAC;  Surgeon: Lowery Estefana RAMAN, DO;  Location: [facility];  Service: Plastics;  Laterality: N/A;   INCISION AND DRAINAGE OF WOUND Right 06/22/2019   Procedure: Excision of abdominal wound with closure;  Surgeon: Lowery Estefana RAMAN, DO;  Location: [facility];  Service: Plastics;  Laterality: Right;  Total case time is 1 hour   LAPAROSCOPIC CHOLECYSTECTOMY  10-19-2002   dr effie  @[facility]    LAPAROSCOPIC GASTRIC BANDING  09-25-2003  @[facility]    WITH HIATAL HERNIA REPAIR   LAPAROSCOPIC LYSIS OF ADHESIONS N/A 01/24/2019   Procedure: LAPAROSCOPY WITH LYSIS OF  ADHESIONS;  Surgeon: Gladis Cough, MD;  Location: [facility];  Service: General;  Laterality: N/A;   LAPAROSCOPIC REPAIR AND REMOVAL OF GASTRIC BAND  02-06-2006  @[facility]    LAPAROTOMY LYSIS ADHESIONS , UTERINE BIOPSY  01-31-2000   dr lowe  @[facility]    OPEN WOUND DEBRIDEMENT ANTERIOR ABDOMINAL WALL , REMOVAL FORGEIN BODY'S  05-10-2001   dr effie  @[facility]    PANNICULECTOMY N/A 01/24/2019   Procedure: PANNICULECTOMY;  Surgeon: Lowery Estefana RAMAN, DO;  Location: [facility];  Service: Plastics;  Laterality: N/A;   REPAIR RECURRENT  HIATAL HERNIA   04-23-2004  @[facility]    TAKEDOWN RECURRENT HIATAL HERNIA FROM PREVIOUS MESH REPAIR OF DIAPHRAGM/ REPAIR HIATAL HERNIA AND THE GASTROPEXY  11/05/2004   TONSILLECTOMY  age 73   TOTAL ABDOMINAL HYSTERECTOMY W/ BILATERAL SALPINGOOPHORECTOMY  12-23-2005   dr soper   W/  BILATERAL URETEROLYSIS AND EXTENSIVE ABD. LYSIS ADHESIONS   UMBILICAL HERNIA REPAIR  12/27/2009   LAPAROSCOPY TAKEDOWN INCARCERATED UMBILICAL HERNIA WITH REPAIR/ OPEN REPAIR COMPLEX LOWER INCISIONAL HERNIA   VAGOTOMY  2006    Family History  Problem Relation Age of Onset   Cancer Maternal Aunt        melanoma   Diabetes Father    Heart attack Father 75     Social History   Tobacco Use  Smoking Status Every Day   Current packs/day: 0.00   Average packs/day: 1 pack/day for 20.0 years (20.0 ttl pk-yrs)   Types: Cigarettes   Start date: 05/20/1995   Last attempt to quit: 05/20/2015   Years since quitting: 8.8  Smokeless Tobacco Never  Tobacco Comments   < 1 pack    Social History   Substance and Sexual Activity  Alcohol Use Yes   Comment: socially     Allergies  Allergen Reactions   Haemophilus B Polysaccharide Vaccine Swelling and Other (See Comments)    Redness at injection site (approximatly golf ball size) hot to the touch   Lisinopril  Hives   Lisinopril -Hydrochlorothiazide  Hives and Other (See Comments)    Welts, too   Wound Dressings Hives   Alpha-Gal Diarrhea, Nausea And Vomiting and  Other (See Comments)    Blisters inside of mouth, too   Bovine (Beef) Protein-Containing Drug Products Diarrhea, Nausea And Vomiting and Other (See Comments)    Alpha-gal   Influenza Vaccines     States her arm had a knot and turned red  Adhesive [Tape] Rash and Other (See Comments)    Also, blisters   Wound Dressing Adhesive Rash and Other (See Comments)    Blisters, too    Current Outpatient Medications  Medication Sig Dispense Refill   acetaminophen  (TYLENOL ) 500 MG tablet Take 1,500 mg by mouth 2 (two) times daily as needed for mild pain (pain score 1-3), moderate pain (pain score 4-6), headache or fever.     albuterol  (VENTOLIN  HFA) 108 (90 Base) MCG/ACT inhaler Inhale 1-2 puffs into the lungs every 6 (six) hours as needed for wheezing or shortness of breath. (Patient taking differently: Inhale 1-2 puffs into the lungs every 3 (three) hours as needed for wheezing or shortness of breath.) 6.7 g 0   ALPRAZolam  (XANAX ) 1 MG tablet Take 1 mg by mouth in the morning and at bedtime.     atorvastatin  (LIPITOR) 40 MG tablet Take 1 tablet (40 mg total) by mouth daily. 30 tablet 3   azelastine  (ASTELIN ) 0.1 % nasal spray Place 2 sprays into both nostrils 2 (two) times daily as needed for rhinitis or allergies.     baclofen  (LIORESAL ) 10 MG tablet Take 1 tablet (10 mg total) by mouth 2 (two) times daily as needed (migraine). 60 tablet 5   Black Cohosh 540 MG CAPS Take 540 mg by mouth at bedtime.     celecoxib  (CELEBREX ) 200 MG capsule Take 200 mg by mouth daily.     chlorhexidine  (PERIDEX ) 0.12 % solution Use as directed 15 mLs in the mouth or throat as needed for wound care for up to 14 days. 480 mL 0   diltiazem  (CARDIZEM  CD) 120 MG 24 hr capsule Take 120 mg by mouth daily.     famotidine  (PEPCID ) 40 MG tablet Take 40 mg by mouth 2 (two) times daily.     fluticasone  (FLONASE) 50 MCG/ACT nasal spray Place 2 sprays into both nostrils daily as needed for allergies or rhinitis.      fluticasone -salmeterol (ADVAIR HFA) 115-21 MCG/ACT inhaler Inhale 2 puffs into the lungs 2 (two) times daily. 1 each 12   gabapentin  (NEURONTIN ) 300 MG capsule Take 300 mg by mouth 3 (three) times daily.     levocetirizine (XYZAL ) 5 MG tablet Take 5 mg by mouth every evening.     magnesium  oxide (MAG-OX) 400 (240 Mg) MG tablet Take 400 mg by mouth daily.     Multiple Vitamin (MULTI-VITAMIN) tablet Take 1 tablet by mouth daily with breakfast.     nebivolol  (BYSTOLIC ) 5 MG tablet Take 5 mg by mouth at bedtime.     pantoprazole  (PROTONIX ) 40 MG tablet Take 40 mg by mouth in the morning and at bedtime.     promethazine  (PHENERGAN ) 25 MG tablet Take 25 mg by mouth daily as needed for nausea or vomiting.     SUMAtriptan  (IMITREX ) 20 MG/ACT nasal spray Place 1 spray (20 mg total) into the nose as needed for migraine or headache. May repeat in 2 hours if headache persists or recurs. 6 each 6   tirzepatide  (MOUNJARO ) 10 MG/0.5ML Pen Inject 10 mg into the skin every 7 (seven) days. 12 mL 1   topiramate  (TOPAMAX ) 50 MG tablet TAKE 1 TABLET BY MOUTH TWICE A DAY 180 tablet 1   ustekinumab (STELARA) 90 MG/ML SOSY injection Inject 90 mg into the skin every 3 (three) months.     venlafaxine  XR (EFFEXOR -XR) 150 MG 24 hr capsule Take 150 mg by mouth daily with breakfast.      clopidogrel  (PLAVIX )  75 MG tablet Take 75 mg by mouth daily. (Patient not taking: Reported on 03/11/2024)     No current facility-administered medications for this visit.    Review of Systems  Constitutional:  Positive for malaise/fatigue. Negative for weight loss.  Respiratory:  Positive for cough.   Gastrointestinal:  Positive for abdominal pain, heartburn, nausea and vomiting.  Neurological: Negative.   Psychiatric/Behavioral:  Positive for depression and suicidal ideas.      PHYSICAL EXAMINATION: BP 124/81 (BP Location: Left Arm, Patient Position: Sitting, Cuff Size: Large)   Pulse 88   Resp 20   Ht 5' 11 (1.803 m)   Wt (!)  356 lb 8 oz (161.7 kg)   SpO2 94% Comment: RA  BMI 49.72 kg/m  Physical Exam Constitutional:      General: She is not in acute distress.    Appearance: She is not ill-appearing.  HENT:     Head: Normocephalic and atraumatic.  Eyes:     Extraocular Movements: Extraocular movements intact.  Cardiovascular:     Rate and Rhythm: Normal rate.  Pulmonary:     Effort: Pulmonary effort is normal. No respiratory distress.  Musculoskeletal:        General: Normal range of motion.  Skin:    General: Skin is warm and dry.  Neurological:     General: No focal deficit present.     Mental Status: She is alert and oriented to person, place, and time.     Diagnostic Studies & Laboratory data:    CT Scan: CT scan from [facility] was reviewed.  This was also compared to her scan from 2020.  She does have a large hiatal hernia with intrathoracic stomach and fat.      I have independently reviewed the above radiology studies  and reviewed the findings with the patient.   Recent Lab Findings: Lab Results  Component Value Date   WBC 15.2 (H) 07/04/2023   HGB 12.6 07/04/2023   HCT 39.5 07/04/2023   PLT 231 07/04/2023   GLUCOSE 91 07/04/2023   CHOL 149 11/03/2021   TRIG 134 11/03/2021   HDL 44 11/03/2021   LDLCALC 78 11/03/2021   ALT 22 03/04/2023   AST 27 03/04/2023   NA 140 07/04/2023   K 3.6 07/04/2023   CL 108 07/04/2023   CREATININE 0.72 07/04/2023   BUN 24 (H) 07/04/2023   CO2 24 07/04/2023   TSH 1.750 10/18/2018   INR 1.1 10/03/2022   HGBA1C 6.0 (H) 11/03/2021        Assessment / Plan:   53 year old female with a large recurrent hiatal hernia.  She has had multiple hiatal operations which have all failed.  She is also morbidly obese with a BMI of 48.  She is also had wound complications from other surgeries.  She would like to proceed with surgery, and I explained to her that will plan to go into her left and right chest to mobilize her stomach out of her mediastinum.  I  also explained to her that given her previous surgical history, and high likelihood of dense adhesions in her mediastinum, we will not attempt to perform any her work measures for for mobilization.  There is a high likelihood that we will need to abort if it is stuck to the back of the heart, or aorta.   I  spent 60 minutes with the patient face to face counseling and coordination of care.    [name] 03/11/2024 1:47 PM

## 2024-03-16 NOTE — Progress Notes (Signed)
 Surgical Instructions   Your procedure is scheduled on March 21, 2024. Report to [facility] Main Entrance A at 5:30 A.M., then check in with the Admitting office. Any questions or running late day of surgery: call [phone]  Questions prior to your surgery date: call [phone], Monday-Friday, 8am-4pm. If you experience any cold or flu symptoms such as cough, fever, chills, shortness of breath, etc. between now and your scheduled surgery, please notify us  at the above number.     Remember:  Do not eat or drink after midnight the night before your surgery    Take these medicines the morning of surgery with A SIP OF WATER  ALPRAZolam  (XANAX )  atorvastatin  (LIPITOR)  diltiazem  (CARDIZEM  CD)  famotidine  (PEPCID )  ADVAIR  gabapentin  (NEURONTIN )  pantoprazole  (PROTONIX        venlafaxine  XR (EFFEXOR -XR)    May take these medicines IF NEEDED: acetaminophen  (TYLENOL )  albuterol  (VENTOLIN  HFA)  inhaler PLEASE BRING WITH YOU azelastine  (ASTELIN ) nasal spray  baclofen  (LIORESAL )  fluticasone  (FLONASE) nasal spray  promethazine  (PHENERGAN )  SUMAtriptan  (IMITREX )   clopidogrel  (PLAVIX ) Last dose 03-15-24  tirzepatide  (MOUNJARO )- HOLD SEVEN DAYS PRIOR TO PROCEDURE  One week prior to surgery, STOP taking any   Aleve , Naproxen , Ibuprofen , Motrin , Advil , Goody's, BC's, all herbal medications, fish oil, and non-prescription vitamins.                     Do NOT Smoke (Tobacco/Vaping) for 24 hours prior to your procedure.  If you use a CPAP at night, you may bring your mask/headgear for your overnight stay.   You will be asked to remove any contacts, glasses, piercing's, hearing aid's, dentures/partials prior to surgery. Please bring cases for these items if needed.    Patients discharged the day of surgery will not be allowed to drive home, and someone needs to stay with them for 24 hours.  SURGICAL WAITING ROOM VISITATION Patients may have no more than 2 support people in  the waiting area - these visitors may rotate.   Pre-op nurse will coordinate an appropriate time for 1 ADULT support person, who may not rotate, to accompany patient in pre-op.  Children under the age of 41 must have an adult with them who is not the patient and must remain in the main waiting area with an adult.  If the patient needs to stay at the hospital during part of their recovery, the visitor guidelines for inpatient rooms apply.  Please refer to the [facility] website for the visitor guidelines for any additional information.   If you received a COVID test during your pre-op visit  it is requested that you wear a mask when out in public, stay away from anyone that may not be feeling well and notify your surgeon if you develop symptoms. If you have been in contact with anyone that has tested positive in the last 10 days please notify you surgeon.      Pre-operative CHG Bathing Instructions   You can play a key role in reducing the risk of infection after surgery. Your skin needs to be as free of germs as possible. You can reduce the number of germs on your skin by washing with CHG (chlorhexidine  gluconate) soap before surgery. CHG is an antiseptic soap that kills germs and continues to kill germs even after washing.   DO NOT use if you have an allergy to chlorhexidine /CHG or antibacterial soaps. If your skin becomes reddened or irritated, stop using the CHG and  notify one of our RNs at [phone].              TAKE A SHOWER THE NIGHT BEFORE SURGERY   Please keep in mind the following:  DO NOT shave, including legs and underarms, 48 hours prior to surgery.   You may shave your face before/day of surgery.  Place clean sheets on your bed the night before surgery Use a clean washcloth (not used since being washed) for shower. DO NOT sleep with pet's night before surgery.  CHG Shower Instructions:  Wash your face and private area with normal soap. If you choose to wash your hair,  wash first with your normal shampoo.  After you use shampoo/soap, rinse your hair and body thoroughly to remove shampoo/soap residue.  Turn the water OFF and apply half the bottle of CHG soap to a CLEAN washcloth.  Apply CHG soap ONLY FROM YOUR NECK DOWN TO YOUR TOES (washing for 3-5 minutes)  DO NOT use CHG soap on face, private areas, open wounds, or sores.  Pay special attention to the area where your surgery is being performed.  If you are having back surgery, having someone wash your back for you may be helpful. Wait 2 minutes after CHG soap is applied, then you may rinse off the CHG soap.  Pat dry with a clean towel  Put on clean pajamas    Additional instructions for the day of surgery: If you choose, you may shower the morning of surgery with an antibacterial soap.  DO NOT APPLY any lotions, deodorants, cologne, or perfumes.   Do not wear jewelry or makeup Do not wear nail polish, gel polish, artificial nails, or any other type of covering on natural nails (fingers and toes) Do not bring valuables to the hospital. [facility] is not responsible for valuables/personal belongings. Put on clean/comfortable clothes.  Please brush your teeth.  Ask your nurse before applying any prescription medications to the skin.

## 2024-03-17 ENCOUNTER — Inpatient Hospital Stay (HOSPITAL_COMMUNITY)
Admission: RE | Admit: 2024-03-17 | Discharge: 2024-03-17 | Disposition: A | Source: Ambulatory Visit | Attending: Thoracic Surgery (Cardiothoracic Vascular Surgery)

## 2024-03-17 ENCOUNTER — Encounter (HOSPITAL_COMMUNITY): Payer: Self-pay

## 2024-03-17 ENCOUNTER — Ambulatory Visit (HOSPITAL_COMMUNITY)
Admission: RE | Admit: 2024-03-17 | Discharge: 2024-03-17 | Disposition: A | Discharge status: Home / Self Care | Source: Ambulatory Visit | Attending: Thoracic Surgery (Cardiothoracic Vascular Surgery) | Admitting: Thoracic Surgery (Cardiothoracic Vascular Surgery)

## 2024-03-17 ENCOUNTER — Other Ambulatory Visit: Payer: Self-pay

## 2024-03-17 VITALS — BP 147/76 | HR 86 | Temp 97.6°F | Resp 20 | Ht 71.0 in | Wt 355.6 lb

## 2024-03-17 DIAGNOSIS — Z87891 Personal history of nicotine dependence: Secondary | ICD-10-CM | POA: Diagnosis not present

## 2024-03-17 DIAGNOSIS — E66813 Obesity, class 3: Secondary | ICD-10-CM | POA: Insufficient documentation

## 2024-03-17 DIAGNOSIS — Z8701 Personal history of pneumonia (recurrent): Secondary | ICD-10-CM | POA: Insufficient documentation

## 2024-03-17 DIAGNOSIS — L404 Guttate psoriasis: Secondary | ICD-10-CM | POA: Insufficient documentation

## 2024-03-17 DIAGNOSIS — R0902 Hypoxemia: Secondary | ICD-10-CM | POA: Insufficient documentation

## 2024-03-17 DIAGNOSIS — Z01818 Encounter for other preprocedural examination: Secondary | ICD-10-CM | POA: Insufficient documentation

## 2024-03-17 DIAGNOSIS — Z6841 Body Mass Index (BMI) 40.0 and over, adult: Secondary | ICD-10-CM | POA: Diagnosis not present

## 2024-03-17 DIAGNOSIS — J45909 Unspecified asthma, uncomplicated: Secondary | ICD-10-CM | POA: Insufficient documentation

## 2024-03-17 DIAGNOSIS — K219 Gastro-esophageal reflux disease without esophagitis: Secondary | ICD-10-CM | POA: Diagnosis not present

## 2024-03-17 DIAGNOSIS — D84821 Immunodeficiency due to drugs: Secondary | ICD-10-CM | POA: Diagnosis not present

## 2024-03-17 DIAGNOSIS — Z79899 Other long term (current) drug therapy: Secondary | ICD-10-CM | POA: Diagnosis not present

## 2024-03-17 DIAGNOSIS — Z7902 Long term (current) use of antithrombotics/antiplatelets: Secondary | ICD-10-CM | POA: Insufficient documentation

## 2024-03-17 DIAGNOSIS — K449 Diaphragmatic hernia without obstruction or gangrene: Secondary | ICD-10-CM | POA: Diagnosis not present

## 2024-03-17 DIAGNOSIS — F319 Bipolar disorder, unspecified: Secondary | ICD-10-CM | POA: Insufficient documentation

## 2024-03-17 DIAGNOSIS — R0789 Other chest pain: Secondary | ICD-10-CM | POA: Insufficient documentation

## 2024-03-17 DIAGNOSIS — G4733 Obstructive sleep apnea (adult) (pediatric): Secondary | ICD-10-CM | POA: Insufficient documentation

## 2024-03-17 DIAGNOSIS — Z01812 Encounter for preprocedural laboratory examination: Secondary | ICD-10-CM | POA: Insufficient documentation

## 2024-03-17 DIAGNOSIS — I10 Essential (primary) hypertension: Secondary | ICD-10-CM | POA: Diagnosis not present

## 2024-03-17 DIAGNOSIS — R0600 Dyspnea, unspecified: Secondary | ICD-10-CM | POA: Diagnosis not present

## 2024-03-17 DIAGNOSIS — R1310 Dysphagia, unspecified: Secondary | ICD-10-CM | POA: Insufficient documentation

## 2024-03-17 LAB — URINALYSIS, ROUTINE W REFLEX MICROSCOPIC
Bilirubin Urine: NEGATIVE
Glucose, UA: NEGATIVE mg/dL
Hgb urine dipstick: NEGATIVE
Ketones, ur: NEGATIVE mg/dL
Leukocytes,Ua: NEGATIVE
Nitrite: NEGATIVE
Protein, ur: NEGATIVE mg/dL
Specific Gravity, Urine: >1.03 — ABNORMAL HIGH (ref 1.005–1.030)
pH: 5.5 (ref 5.0–8.0)

## 2024-03-17 LAB — CBC
HCT: 43.5 % (ref 36.0–46.0)
Hemoglobin: 13.5 g/dL (ref 12.0–15.0)
MCH: 29.2 pg (ref 26.0–34.0)
MCHC: 31 g/dL (ref 30.0–36.0)
MCV: 94.2 fL (ref 80.0–100.0)
Platelets: 247 K/uL (ref 150–400)
RBC: 4.62 MIL/uL (ref 3.87–5.11)
RDW: 15.9 % — ABNORMAL HIGH (ref 11.5–15.5)
WBC: 10.6 K/uL — ABNORMAL HIGH (ref 4.0–10.5)
nRBC: 0 % (ref 0.0–0.2)

## 2024-03-17 LAB — COMPREHENSIVE METABOLIC PANEL WITH GFR
ALT: 31 U/L (ref 0–44)
AST: 30 U/L (ref 15–41)
Albumin: 3.4 g/dL — ABNORMAL LOW (ref 3.5–5.0)
Alkaline Phosphatase: 87 U/L (ref 38–126)
Anion gap: 7 (ref 5–15)
BUN: 11 mg/dL (ref 6–20)
CO2: 29 mmol/L (ref 22–32)
Calcium: 8.8 mg/dL — ABNORMAL LOW (ref 8.9–10.3)
Chloride: 104 mmol/L (ref 98–111)
Creatinine, Ser: 0.78 mg/dL (ref 0.44–1.00)
GFR, Estimated: >60 mL/min (ref >60)
Glucose, Bld: 111 mg/dL — ABNORMAL HIGH (ref 70–99)
Potassium: 4.2 mmol/L (ref 3.5–5.1)
Sodium: 140 mmol/L (ref 135–145)
Total Bilirubin: 0.7 mg/dL (ref 0.0–1.2)
Total Protein: 7.3 g/dL (ref 6.5–8.1)

## 2024-03-17 LAB — SURGICAL PCR SCREEN
MRSA, PCR: NEGATIVE
Staphylococcus aureus: NEGATIVE

## 2024-03-17 LAB — GLUCOSE, CAPILLARY: Glucose-Capillary: 154 mg/dL — ABNORMAL HIGH (ref 70–99)

## 2024-03-17 LAB — PROTIME-INR
INR: 1 (ref 0.8–1.2)
Prothrombin Time: 14.2 s (ref 11.4–15.2)

## 2024-03-17 LAB — APTT: aPTT: 31 s (ref 24–36)

## 2024-03-17 NOTE — Progress Notes (Addendum)
 PCP - Dr. Rosina Bullock Cardiologist - Dr. Youlanda Caprio @ [facility]. LOV w/clearance on 12-08-23  PPM/ICD - denies Device Orders - n/a Rep Notified - n/a  Chest x-ray - 03-17-24 EKG - 03-17-24 Stress Test - 01-05-24 ECHO - 12-22-23 Cardiac Cath - denies  Sleep Study - 12-20-23, positive CPAP - wears BiPap nightly  Fasting Blood Sugar - 120-140's Checks Blood Sugar - patient checks blood sugar once every other week  Pre-diabetes  Last dose of GLP1 agonist-  per patient takes for weight loss, hold for 7 day's prior to surgery GLP1 instructions: per patient she took her last dose on Monday, Dec 1st.  Blood Thinner Instructions: clopidogrel  (PLAVIX )  - Hold for 5 days, Last dose on 03-15-24, Friday Aspirin  Instructions:denies  ERAS Protcol - npo PRE-SURGERY Ensure or G2- none  COVID TEST- n/a   Anesthesia review: yes, HTN, Asthma, Stroke in 2023.  Patient denies shortness of breath, fever, cough and chest pain at PAT appointment. Patient denies any respiratory issues at this time.    All instructions explained to the patient, with a verbal understanding of the material. Patient agrees to go over the instructions while at home for a better understanding. Patient also instructed to self quarantine after being tested for COVID-19. The opportunity to ask questions was provided.

## 2024-03-17 NOTE — Progress Notes (Signed)
 Surgical Instructions     Your procedure is scheduled on March 21, 2024. Report to [facility] Main Entrance A at 5:30 A.M., then check in with the Admitting office. Any questions or running late day of surgery: call [phone]   Questions prior to your surgery date: call [phone], Monday-Friday, 8am-4pm. If you experience any cold or flu symptoms such as cough, fever, chills, shortness of breath, etc. between now and your scheduled surgery, please notify us  at the above number.            Remember:       Do not eat or drink after midnight the night before your surgery                Take these medicines the morning of surgery with A SIP OF WATER  ALPRAZolam  (XANAX )  atorvastatin  (LIPITOR)  diltiazem  (CARDIZEM  CD)  famotidine  (PEPCID )  ADVAIR  gabapentin  (NEURONTIN )  pantoprazole  (PROTONIX        venlafaxine  XR (EFFEXOR -XR)      May take these medicines IF NEEDED: acetaminophen  (TYLENOL )  albuterol  (VENTOLIN  HFA)  inhaler PLEASE BRING WITH YOU azelastine  (ASTELIN ) nasal spray  baclofen  (LIORESAL )  fluticasone  (FLONASE) nasal spray  promethazine  (PHENERGAN )  SUMAtriptan  (IMITREX )    clopidogrel  (PLAVIX ) HOLD for 5 days.  Last dose on Tuesday, December 9th.   tirzepatide  (MOUNJARO )- HOLD SEVEN DAYS PRIOR TO PROCEDURE   One week prior to surgery, STOP taking any   Aleve , Naproxen , Ibuprofen , Motrin , Advil , Goody's, BC's, all herbal medications, fish oil, and non-prescription vitamins.                     Do NOT Smoke (Tobacco/Vaping) for 24 hours prior to your procedure.   If you use a CPAP at night, you may bring your mask/headgear for your overnight stay.   You will be asked to remove any contacts, glasses, piercing's, hearing aid's, dentures/partials prior to surgery. Please bring cases for these items if needed.    Patients discharged the day of surgery will not be allowed to drive home, and someone needs to stay with them for 24 hours.   SURGICAL WAITING  ROOM VISITATION Patients may have no more than 2 support people in the waiting area - these visitors may rotate.   Pre-op nurse will coordinate an appropriate time for 1 ADULT support person, who may not rotate, to accompany patient in pre-op.  Children under the age of 33 must have an adult with them who is not the patient and must remain in the main waiting area with an adult.   If the patient needs to stay at the hospital during part of their recovery, the visitor guidelines for inpatient rooms apply.   Please refer to the [facility] website for the visitor guidelines for any additional information.     If you received a COVID test during your pre-op visit  it is requested that you wear a mask when out in public, stay away from anyone that may not be feeling well and notify your surgeon if you develop symptoms. If you have been in contact with anyone that has tested positive in the last 10 days please notify you surgeon.         Pre-operative CHG Bathing Instructions    You can play a key role in reducing the risk of infection after surgery. Your skin needs to be as free of germs as possible. You can reduce the number of germs on your skin by washing  with CHG (chlorhexidine  gluconate) soap before surgery. CHG is an antiseptic soap that kills germs and continues to kill germs even after washing.    DO NOT use if you have an allergy to chlorhexidine /CHG or antibacterial soaps. If your skin becomes reddened or irritated, stop using the CHG and notify one of our RNs at [phone].               TAKE A SHOWER THE NIGHT BEFORE SURGERY    Please keep in mind the following:  DO NOT shave, including legs and underarms, 48 hours prior to surgery.   You may shave your face before/day of surgery.  Place clean sheets on your bed the night before surgery Use a clean washcloth (not used since being washed) for shower. DO NOT sleep with pet's night before surgery.   CHG Shower Instructions:   Wash your face and private area with normal soap. If you choose to wash your hair, wash first with your normal shampoo.  After you use shampoo/soap, rinse your hair and body thoroughly to remove shampoo/soap residue.  Turn the water OFF and apply half the bottle of CHG soap to a CLEAN washcloth.  Apply CHG soap ONLY FROM YOUR NECK DOWN TO YOUR TOES (washing for 3-5 minutes)  DO NOT use CHG soap on face, private areas, open wounds, or sores.  Pay special attention to the area where your surgery is being performed.  If you are having back surgery, having someone wash your back for you may be helpful. Wait 2 minutes after CHG soap is applied, then you may rinse off the CHG soap.  Pat dry with a clean towel  Put on clean pajamas     Additional instructions for the day of surgery: If you choose, you may shower the morning of surgery with an antibacterial soap.  DO NOT APPLY any lotions, deodorants, cologne, or perfumes.   Do not wear jewelry or makeup Do not wear nail polish, gel polish, artificial nails, or any other type of covering on natural nails (fingers and toes) Do not bring valuables to the hospital. [facility] is not responsible for valuables/personal belongings. Put on clean/comfortable clothes.  Please brush your teeth.  Ask your nurse before applying any prescription medications to the skin.

## 2024-03-18 ENCOUNTER — Other Ambulatory Visit: Payer: Self-pay | Admitting: Thoracic Surgery (Cardiothoracic Vascular Surgery)

## 2024-03-18 ENCOUNTER — Ambulatory Visit (HOSPITAL_COMMUNITY)
Admission: RE | Admit: 2024-03-18 | Discharge: 2024-03-18 | Disposition: A | Source: Ambulatory Visit | Attending: Thoracic Surgery (Cardiothoracic Vascular Surgery)

## 2024-03-18 DIAGNOSIS — K449 Diaphragmatic hernia without obstruction or gangrene: Secondary | ICD-10-CM

## 2024-03-18 NOTE — Progress Notes (Signed)
 Anesthesia Chart Review:  53 year old female follows with cardiology at [facility] for history of HTN, atypical chest pain, tachypalpitations, DOE, venous insufficiency, OSA intolerant to CPAP.  She had low risk nuclear stress test in 01/24/2021 and 05/27/2022.  Last seen by Dr. Fredrica 12/08/2023 and updated echo and stress test were ordered.  Echo 12/22/2023 showed LVEF 65%, normal RV, no significant valvular abnormalities.  Nuclear stress 01/05/2024 was low risk.  History of large recurrent hiatal hernia s/p multiple surgical interventions.  She has dysphagia and recurrent aspiration pneumonia related to this.  In 2006 she had concomitant hiatal hernia repair and lap band bariatric surgery.  Lap-Band was subsequently removed.  She was seen by Dr. Shyrl on 03/11/2024 for evaluation of large recurrent hiatal hernia.  Per note, She would like to proceed with surgery, and I explained to her that will plan to go into her left and right chest to mobilize her stomach out of her mediastinum. I also explained to her that given her previous surgical history, and high likelihood of dense adhesions in her mediastinum, we will not attempt to perform any her work measures for for mobilization. There is a high likelihood that we will need to abort if it is stuck to the back of the heart, or aorta.  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  Other pertinent history includes former smoker, PONV, asthma, GERD on PPI and H2 blocker, chronic headaches, complex migraine vs TIA, OSA with nocturnal hypoxia on BiPAP, alpha gal syndrome, bipolar disorder, class III obesity BMI 49  Patient reports last dose GLP-1 agonist 03/07/2024.  Last dose Plavix  03/15/2024 (per cardiology notes, patient was switched from aspirin  to Plavix  in 04/2022 due to history of bariatric surgery).  Preop labs reviewed, unremarkable.  EKG 03/17/2024: NSR.  Rate 72.  Nuclear  stress 01/05/2024 (Care Everywhere): IMPRESSION:  - It was technically difficult study.  - No definite evidence of inducible ischemia.  - No definite evidence of scar.  - Small to moderate size mild intensity basal inferior, mid inferior and distal inferior essentially fixed defect with slight reversibility at the distal inferior segment likely secondary to diaphragmatic attenuation and doubtful secondary to small scar with peri-infarct ischemia.  - Small size mild intensity basal anterior fixed defect likely secondary to breast attenuation and doubtful for tiny scar.  - Small size mild intensity mid to anteroseptal and the small part of distal septal fixed defect likely secondary to breast attenuation and doubtful for tiny scar.  - GI interference, Breast attenuation and diaphragmatic attenuation noted at rest and after Lexiscan infusion and could mildly decrease specificity of this study.  - Prognostically this is a low risk scan.  - Left ventricular ejection fraction of 70%. Normal LV size. LV end-diastolic volume -99 ml, end-systolic volume -30 ml.  - Normal left ventricular systolic wall motion.   TTE 12/22/2023 (Care Everywhere): LVEF 60-65%, normal RV, no significant valvular abnormalities.      Lynwood Geofm RIGGERS [facility] Short Stay Center/Anesthesiology Phone [phone] 03/18/2024 9:51 AM

## 2024-03-18 NOTE — Anesthesia Preprocedure Evaluation (Addendum)
 Anesthesia Evaluation  Patient identified by MRN, date of birth, ID band Patient awake    Reviewed: Allergy & Precautions, H&P , NPO status , Patient's Chart, lab work & pertinent test results  History of Anesthesia Complications (+) PONV and history of anesthetic complications  Airway Mallampati: II   Neck ROM: full    Dental   Pulmonary asthma , sleep apnea , former smoker   breath sounds clear to auscultation       Cardiovascular hypertension,  Rhythm:regular Rate:Normal     Neuro/Psych  PSYCHIATRIC DISORDERS Anxiety Depression       GI/Hepatic hiatal hernia,GERD  ,,  Endo/Other    Class 3 obesity  Renal/GU      Musculoskeletal  (+) Arthritis ,    Abdominal   Peds  Hematology   Anesthesia Other Findings   Reproductive/Obstetrics                              Anesthesia Physical Anesthesia Plan  ASA: 3  Anesthesia Plan: General   Post-op Pain Management:    Induction: Intravenous  PONV Risk Score and Plan: 4 or greater and Ondansetron , Dexamethasone , Midazolam  and Treatment may vary due to age or medical condition  Airway Management Planned: Oral ETT  Additional Equipment:   Intra-op Plan:   Post-operative Plan: Extubation in OR  Informed Consent: I have reviewed the patients History and Physical, chart, labs and discussed the procedure including the risks, benefits and alternatives for the proposed anesthesia with the patient or authorized representative who has indicated his/her understanding and acceptance.     Dental advisory given  Plan Discussed with: CRNA, Anesthesiologist and Surgeon  Anesthesia Plan Comments: (PAT note by Lynwood Hope, PA-C: 53 year old female follows with cardiology at [facility] for history of HTN, atypical chest pain, tachypalpitations, DOE, venous insufficiency, OSA intolerant to CPAP.  She had low risk nuclear stress test in 01/24/2021 and  05/27/2022.  Last seen by Dr. Fredrica 12/08/2023 and updated echo and stress test were ordered.  Echo 12/22/2023 showed LVEF 65%, normal RV, no significant valvular abnormalities.  Nuclear stress 01/05/2024 was low risk.  History of large recurrent hiatal hernia s/p multiple surgical interventions.  She has dysphagia and recurrent aspiration pneumonia related to this.  In 2006 she had concomitant hiatal hernia repair and lap band bariatric surgery.  Lap-Band was subsequently removed.  She was seen by Dr. Shyrl on 03/11/2024 for evaluation of large recurrent hiatal hernia.  Per note, She would like to proceed with surgery, and I explained to her that will plan to go into her left and right chest to mobilize her stomach out of her mediastinum. I also explained to her that given her previous surgical history, and high likelihood of dense adhesions in her mediastinum, we will not attempt to perform any her work measures for for mobilization. There is a high likelihood that we will need to abort if it is stuck to the back of the heart, or aorta.  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  Other pertinent history includes former smoker, PONV, asthma, GERD on PPI and H2 blocker, chronic headaches, complex migraine vs TIA, OSA with nocturnal hypoxia on BiPAP, alpha gal syndrome, bipolar disorder, class III obesity BMI 49  Patient reports last dose GLP-1 agonist 03/07/2024.  Last dose Plavix  03/15/2024 (per cardiology notes, patient was switched from aspirin  to Plavix  in 04/2022 due to  history of bariatric surgery).  Preop labs reviewed, unremarkable.  EKG 03/17/2024: NSR.  Rate 72.  Nuclear stress 01/05/2024 (Care Everywhere): IMPRESSION:  - It was technically difficult study.  - No definite evidence of inducible ischemia.  - No definite evidence of scar.  - Small to moderate size mild intensity basal inferior, mid inferior and  distal inferior essentially fixed defect with slight reversibility at the distal inferior segment likely secondary to diaphragmatic attenuation and doubtful secondary to small scar with peri-infarct ischemia.  - Small size mild intensity basal anterior fixed defect likely secondary to breast attenuation and doubtful for tiny scar.  - Small size mild intensity mid to anteroseptal and the small part of distal septal fixed defect likely secondary to breast attenuation and doubtful for tiny scar.  - GI interference, Breast attenuation and diaphragmatic attenuation noted at rest and after Lexiscan infusion and could mildly decrease specificity of this study.  - Prognostically this is a low risk scan.  - Left ventricular ejection fraction of 70%. Normal LV size. LV end-diastolic volume -99 ml, end-systolic volume -30 ml.  - Normal left ventricular systolic wall motion.   TTE 12/22/2023 (Care Everywhere): LVEF 60-65%, normal RV, no significant valvular abnormalities.   )         Anesthesia Quick Evaluation

## 2024-03-21 ENCOUNTER — Telehealth (HOSPITAL_COMMUNITY): Payer: Self-pay

## 2024-03-21 ENCOUNTER — Inpatient Hospital Stay (HOSPITAL_COMMUNITY)
Admission: RE | Admit: 2024-03-21 | Discharge: 2024-03-23 | DRG: 326 | Disposition: A | Discharge status: Home / Self Care | Attending: General Surgery | Admitting: General Surgery

## 2024-03-21 ENCOUNTER — Other Ambulatory Visit (HOSPITAL_COMMUNITY): Payer: Self-pay

## 2024-03-21 ENCOUNTER — Encounter (HOSPITAL_COMMUNITY): Payer: Self-pay | Admitting: Physician Assistant

## 2024-03-21 ENCOUNTER — Inpatient Hospital Stay (HOSPITAL_COMMUNITY)

## 2024-03-21 ENCOUNTER — Other Ambulatory Visit: Payer: Self-pay

## 2024-03-21 ENCOUNTER — Inpatient Hospital Stay (HOSPITAL_COMMUNITY): Admitting: Registered Nurse

## 2024-03-21 ENCOUNTER — Encounter (HOSPITAL_COMMUNITY): Payer: Self-pay | Admitting: Thoracic Surgery (Cardiothoracic Vascular Surgery)

## 2024-03-21 ENCOUNTER — Inpatient Hospital Stay (HOSPITAL_COMMUNITY): Payer: Self-pay | Admitting: Physician Assistant

## 2024-03-21 ENCOUNTER — Encounter (HOSPITAL_COMMUNITY)
Admission: RE | Disposition: A | Payer: Self-pay | Source: Home / Self Care | Attending: Thoracic Surgery (Cardiothoracic Vascular Surgery)

## 2024-03-21 DIAGNOSIS — L4052 Psoriatic arthritis mutilans: Secondary | ICD-10-CM

## 2024-03-21 DIAGNOSIS — K2270 Barrett's esophagus without dysplasia: Secondary | ICD-10-CM | POA: Diagnosis present

## 2024-03-21 DIAGNOSIS — Z833 Family history of diabetes mellitus: Secondary | ICD-10-CM | POA: Diagnosis not present

## 2024-03-21 DIAGNOSIS — I10 Essential (primary) hypertension: Secondary | ICD-10-CM

## 2024-03-21 DIAGNOSIS — Z8673 Personal history of transient ischemic attack (TIA), and cerebral infarction without residual deficits: Secondary | ICD-10-CM

## 2024-03-21 DIAGNOSIS — Z90722 Acquired absence of ovaries, bilateral: Secondary | ICD-10-CM

## 2024-03-21 DIAGNOSIS — Z87891 Personal history of nicotine dependence: Secondary | ICD-10-CM

## 2024-03-21 DIAGNOSIS — F329 Major depressive disorder, single episode, unspecified: Secondary | ICD-10-CM

## 2024-03-21 DIAGNOSIS — J9602 Acute respiratory failure with hypercapnia: Secondary | ICD-10-CM | POA: Diagnosis present

## 2024-03-21 DIAGNOSIS — K219 Gastro-esophageal reflux disease without esophagitis: Secondary | ICD-10-CM | POA: Diagnosis present

## 2024-03-21 DIAGNOSIS — G43909 Migraine, unspecified, not intractable, without status migrainosus: Secondary | ICD-10-CM | POA: Diagnosis present

## 2024-03-21 DIAGNOSIS — Z7951 Long term (current) use of inhaled steroids: Secondary | ICD-10-CM

## 2024-03-21 DIAGNOSIS — Z8701 Personal history of pneumonia (recurrent): Secondary | ICD-10-CM

## 2024-03-21 DIAGNOSIS — K449 Diaphragmatic hernia without obstruction or gangrene: Principal | ICD-10-CM

## 2024-03-21 DIAGNOSIS — E782 Mixed hyperlipidemia: Secondary | ICD-10-CM | POA: Diagnosis present

## 2024-03-21 DIAGNOSIS — J690 Pneumonitis due to inhalation of food and vomit: Secondary | ICD-10-CM | POA: Diagnosis not present

## 2024-03-21 DIAGNOSIS — L4050 Arthropathic psoriasis, unspecified: Secondary | ICD-10-CM | POA: Diagnosis present

## 2024-03-21 DIAGNOSIS — R112 Nausea with vomiting, unspecified: Secondary | ICD-10-CM | POA: Diagnosis present

## 2024-03-21 DIAGNOSIS — F419 Anxiety disorder, unspecified: Secondary | ICD-10-CM | POA: Diagnosis present

## 2024-03-21 DIAGNOSIS — G4730 Sleep apnea, unspecified: Secondary | ICD-10-CM | POA: Diagnosis not present

## 2024-03-21 DIAGNOSIS — Z9151 Personal history of suicidal behavior: Secondary | ICD-10-CM

## 2024-03-21 DIAGNOSIS — L404 Guttate psoriasis: Secondary | ICD-10-CM | POA: Diagnosis present

## 2024-03-21 DIAGNOSIS — R5381 Other malaise: Secondary | ICD-10-CM | POA: Diagnosis present

## 2024-03-21 DIAGNOSIS — Z7902 Long term (current) use of antithrombotics/antiplatelets: Secondary | ICD-10-CM

## 2024-03-21 DIAGNOSIS — Z8709 Personal history of other diseases of the respiratory system: Secondary | ICD-10-CM

## 2024-03-21 DIAGNOSIS — R7303 Prediabetes: Secondary | ICD-10-CM | POA: Diagnosis present

## 2024-03-21 DIAGNOSIS — I9581 Postprocedural hypotension: Secondary | ICD-10-CM | POA: Diagnosis not present

## 2024-03-21 DIAGNOSIS — F5101 Primary insomnia: Secondary | ICD-10-CM | POA: Diagnosis present

## 2024-03-21 DIAGNOSIS — Z87898 Personal history of other specified conditions: Secondary | ICD-10-CM

## 2024-03-21 DIAGNOSIS — J45909 Unspecified asthma, uncomplicated: Secondary | ICD-10-CM | POA: Diagnosis present

## 2024-03-21 DIAGNOSIS — E66813 Obesity, class 3: Secondary | ICD-10-CM | POA: Diagnosis present

## 2024-03-21 DIAGNOSIS — Z9889 Other specified postprocedural states: Principal | ICD-10-CM

## 2024-03-21 DIAGNOSIS — Z6841 Body Mass Index (BMI) 40.0 and over, adult: Secondary | ICD-10-CM

## 2024-03-21 DIAGNOSIS — G629 Polyneuropathy, unspecified: Secondary | ICD-10-CM | POA: Diagnosis present

## 2024-03-21 DIAGNOSIS — Z91014 Allergy to mammalian meats: Secondary | ICD-10-CM

## 2024-03-21 DIAGNOSIS — G4733 Obstructive sleep apnea (adult) (pediatric): Secondary | ICD-10-CM | POA: Diagnosis present

## 2024-03-21 DIAGNOSIS — Z79899 Other long term (current) drug therapy: Secondary | ICD-10-CM

## 2024-03-21 DIAGNOSIS — Z90710 Acquired absence of both cervix and uterus: Secondary | ICD-10-CM

## 2024-03-21 DIAGNOSIS — Z8249 Family history of ischemic heart disease and other diseases of the circulatory system: Secondary | ICD-10-CM

## 2024-03-21 DIAGNOSIS — Z888 Allergy status to other drugs, medicaments and biological substances status: Secondary | ICD-10-CM | POA: Diagnosis not present

## 2024-03-21 DIAGNOSIS — Z808 Family history of malignant neoplasm of other organs or systems: Secondary | ICD-10-CM

## 2024-03-21 DIAGNOSIS — Z8719 Personal history of other diseases of the digestive system: Secondary | ICD-10-CM | POA: Diagnosis not present

## 2024-03-21 DIAGNOSIS — Z91048 Other nonmedicinal substance allergy status: Secondary | ICD-10-CM

## 2024-03-21 DIAGNOSIS — Z9079 Acquired absence of other genital organ(s): Secondary | ICD-10-CM

## 2024-03-21 DIAGNOSIS — Z9049 Acquired absence of other specified parts of digestive tract: Secondary | ICD-10-CM

## 2024-03-21 DIAGNOSIS — K660 Peritoneal adhesions (postprocedural) (postinfection): Secondary | ICD-10-CM | POA: Diagnosis present

## 2024-03-21 HISTORY — PX: XI ROBOTIC ASSISTED HIATAL HERNIA REPAIR: SHX6889

## 2024-03-21 HISTORY — PX: THORACOSCOPY, ROBOT-ASSISTED: SHX7645

## 2024-03-21 HISTORY — PX: UPPER GI ENDOSCOPY: SHX6162

## 2024-03-21 HISTORY — PX: GASTRIC ROUX-EN-Y: SHX5262

## 2024-03-21 LAB — POCT I-STAT 7, (LYTES, BLD GAS, ICA,H+H)
Acid-base deficit: 2 mmol/L (ref 0.0–2.0)
Bicarbonate: 23.7 mmol/L (ref 20.0–28.0)
Calcium, Ion: 1.1 mmol/L — ABNORMAL LOW (ref 1.15–1.40)
HCT: 37 % (ref 36.0–46.0)
Hemoglobin: 12.6 g/dL (ref 12.0–15.0)
O2 Saturation: 100 %
Patient temperature: 98
Potassium: 4.6 mmol/L (ref 3.5–5.1)
Sodium: 140 mmol/L (ref 135–145)
TCO2: 25 mmol/L (ref 22–32)
pCO2 arterial: 43.4 mmHg (ref 32–48)
pH, Arterial: 7.344 — ABNORMAL LOW (ref 7.35–7.45)
pO2, Arterial: 201 mmHg — ABNORMAL HIGH (ref 83–108)

## 2024-03-21 LAB — CBC WITH DIFFERENTIAL/PLATELET
Abs Immature Granulocytes: 0.1 K/uL — ABNORMAL HIGH (ref 0.00–0.07)
Basophils Absolute: 0 K/uL (ref 0.0–0.1)
Basophils Relative: 0 %
Eosinophils Absolute: 0 K/uL (ref 0.0–0.5)
Eosinophils Relative: 0 %
HCT: 38.7 % (ref 36.0–46.0)
Hemoglobin: 12.1 g/dL (ref 12.0–15.0)
Immature Granulocytes: 1 %
Lymphocytes Relative: 4 %
Lymphs Abs: 0.6 K/uL — ABNORMAL LOW (ref 0.7–4.0)
MCH: 29.8 pg (ref 26.0–34.0)
MCHC: 31.3 g/dL (ref 30.0–36.0)
MCV: 95.3 fL (ref 80.0–100.0)
Monocytes Absolute: 0.4 K/uL (ref 0.1–1.0)
Monocytes Relative: 3 %
Neutro Abs: 13.6 K/uL — ABNORMAL HIGH (ref 1.7–7.7)
Neutrophils Relative %: 92 %
Platelets: 210 K/uL (ref 150–400)
RBC: 4.06 MIL/uL (ref 3.87–5.11)
RDW: 15.9 % — ABNORMAL HIGH (ref 11.5–15.5)
WBC: 14.8 K/uL — ABNORMAL HIGH (ref 4.0–10.5)
nRBC: 0 % (ref 0.0–0.2)

## 2024-03-21 LAB — COMPREHENSIVE METABOLIC PANEL WITH GFR
ALT: 33 U/L (ref 0–44)
AST: 48 U/L — ABNORMAL HIGH (ref 15–41)
Albumin: 3.2 g/dL — ABNORMAL LOW (ref 3.5–5.0)
Alkaline Phosphatase: 66 U/L (ref 38–126)
Anion gap: 12 (ref 5–15)
BUN: 15 mg/dL (ref 6–20)
CO2: 22 mmol/L (ref 22–32)
Calcium: 8.2 mg/dL — ABNORMAL LOW (ref 8.9–10.3)
Chloride: 104 mmol/L (ref 98–111)
Creatinine, Ser: 0.88 mg/dL (ref 0.44–1.00)
GFR, Estimated: >60 mL/min (ref >60)
Glucose, Bld: 201 mg/dL — ABNORMAL HIGH (ref 70–99)
Potassium: 4.6 mmol/L (ref 3.5–5.1)
Sodium: 138 mmol/L (ref 135–145)
Total Bilirubin: 0.6 mg/dL (ref 0.0–1.2)
Total Protein: 6.4 g/dL — ABNORMAL LOW (ref 6.5–8.1)

## 2024-03-21 LAB — PROTIME-INR
INR: 1.1 (ref 0.8–1.2)
Prothrombin Time: 14.9 s (ref 11.4–15.2)

## 2024-03-21 LAB — HEMOGLOBIN A1C
Hgb A1c MFr Bld: 5.9 % — ABNORMAL HIGH (ref 4.8–5.6)
Mean Plasma Glucose: 122.63 mg/dL

## 2024-03-21 LAB — MAGNESIUM: Magnesium: 1.8 mg/dL (ref 1.7–2.4)

## 2024-03-21 LAB — APTT: aPTT: 30 s (ref 24–36)

## 2024-03-21 LAB — GLUCOSE, CAPILLARY
Glucose-Capillary: 137 mg/dL — ABNORMAL HIGH (ref 70–99)
Glucose-Capillary: 201 mg/dL — ABNORMAL HIGH (ref 70–99)
Glucose-Capillary: 212 mg/dL — ABNORMAL HIGH (ref 70–99)

## 2024-03-21 LAB — PREPARE RBC (CROSSMATCH)

## 2024-03-21 SURGERY — THORACOSCOPY, ROBOT-ASSISTED
Anesthesia: General | Site: Chest

## 2024-03-21 MED ORDER — NOREPINEPHRINE 4 MG/250ML-% IV SOLN
0.0000 ug/min | INTRAVENOUS | Status: DC
  Filled 2024-03-21: qty 250

## 2024-03-21 MED ORDER — FENTANYL CITRATE (PF) 250 MCG/5ML IJ SOLN
INTRAMUSCULAR | Status: DC | PRN
  Administered 2024-03-21: 09:00:00 50 ug via INTRAVENOUS
  Administered 2024-03-21 (×3): 100 ug via INTRAVENOUS

## 2024-03-21 MED ORDER — SUGAMMADEX SODIUM 200 MG/2ML IV SOLN
INTRAVENOUS | Status: DC | PRN
  Administered 2024-03-21: 15:00:00 400 mg via INTRAVENOUS

## 2024-03-21 MED ORDER — BUPIVACAINE-EPINEPHRINE 0.25% -1:200000 IJ SOLN: INTRAMUSCULAR | Status: DC | PRN

## 2024-03-21 MED ORDER — FENTANYL CITRATE (PF) 100 MCG/2ML IJ SOLN
INTRAMUSCULAR | Status: AC
  Filled 2024-03-21: qty 2

## 2024-03-21 MED ORDER — APREPITANT 40 MG PO CAPS
40.0000 mg | ORAL_CAPSULE | ORAL | Status: AC
  Administered 2024-03-21: 07:00:00 40 mg via ORAL
  Filled 2024-03-21: qty 1

## 2024-03-21 MED ORDER — ARFORMOTEROL TARTRATE 15 MCG/2ML IN NEBU
15.0000 ug | INHALATION_SOLUTION | Freq: Two times a day (BID) | RESPIRATORY_TRACT | Status: DC
  Administered 2024-03-21: 20:00:00 15 ug via RESPIRATORY_TRACT
  Filled 2024-03-21: qty 2

## 2024-03-21 MED ORDER — ACETAMINOPHEN 500 MG PO TABS
1000.0000 mg | ORAL_TABLET | ORAL | Status: AC
  Administered 2024-03-21: 07:00:00 1000 mg via ORAL
  Filled 2024-03-21: qty 2

## 2024-03-21 MED ORDER — SUCCINYLCHOLINE CHLORIDE 200 MG/10ML IV SOSY
PREFILLED_SYRINGE | INTRAVENOUS | Status: DC | PRN
  Administered 2024-03-21: 17:00:00 140 mg via INTRAVENOUS

## 2024-03-21 MED ORDER — BUDESONIDE 0.5 MG/2ML IN SUSP
0.5000 mg | Freq: Two times a day (BID) | RESPIRATORY_TRACT | Status: DC
  Administered 2024-03-21 – 2024-03-23 (×4): 0.5 mg via RESPIRATORY_TRACT
  Filled 2024-03-21 (×4): qty 2

## 2024-03-21 MED ORDER — EPHEDRINE SULFATE-NACL 50-0.9 MG/10ML-% IV SOSY
PREFILLED_SYRINGE | INTRAVENOUS | Status: DC | PRN
  Administered 2024-03-21: 10:00:00 10 mg via INTRAVENOUS
  Administered 2024-03-21: 09:00:00 5 mg via INTRAVENOUS
  Administered 2024-03-21: 11:00:00 10 mg via INTRAVENOUS
  Administered 2024-03-21: 09:00:00 5 mg via INTRAVENOUS

## 2024-03-21 MED ORDER — ONDANSETRON HCL 4 MG/2ML IJ SOLN
4.0000 mg | INTRAMUSCULAR | Status: DC | PRN
  Filled 2024-03-21: qty 2

## 2024-03-21 MED ORDER — SODIUM CHLORIDE 0.9 % IV SOLN
2.0000 g | INTRAVENOUS | Status: AC
  Administered 2024-03-21 (×2): 2 g via INTRAVENOUS
  Filled 2024-03-21: qty 2

## 2024-03-21 MED ORDER — PROPOFOL 1000 MG/100ML IV EMUL
0.0000 ug/kg/min | INTRAVENOUS | Status: DC
  Administered 2024-03-21 – 2024-03-22 (×5): 30 ug/kg/min via INTRAVENOUS
  Filled 2024-03-21: qty 100
  Filled 2024-03-21: qty 200
  Filled 2024-03-21: qty 100

## 2024-03-21 MED ORDER — PROPOFOL 10 MG/ML IV BOLUS
INTRAVENOUS | Status: AC
  Filled 2024-03-21: qty 20

## 2024-03-21 MED ORDER — ENOXAPARIN SODIUM 30 MG/0.3ML IJ SOSY
30.0000 mg | PREFILLED_SYRINGE | Freq: Two times a day (BID) | INTRAMUSCULAR | Status: DC
  Administered 2024-03-21 – 2024-03-23 (×4): 30 mg via SUBCUTANEOUS
  Filled 2024-03-21 (×4): qty 0.3

## 2024-03-21 MED ORDER — ACETAMINOPHEN 500 MG PO TABS
1000.0000 mg | ORAL_TABLET | Freq: Three times a day (TID) | ORAL | Status: DC
  Administered 2024-03-22 – 2024-03-23 (×3): 1000 mg via ORAL
  Filled 2024-03-21 (×3): qty 2

## 2024-03-21 MED ORDER — BUPIVACAINE HCL (PF) 0.5 % IJ SOLN
INTRAMUSCULAR | Status: DC | PRN
  Administered 2024-03-21: 10:00:00 30 mL

## 2024-03-21 MED ORDER — ALBUTEROL SULFATE (2.5 MG/3ML) 0.083% IN NEBU
INHALATION_SOLUTION | RESPIRATORY_TRACT | Status: AC
  Filled 2024-03-21: qty 3

## 2024-03-21 MED ORDER — HEPARIN SODIUM (PORCINE) 5000 UNIT/ML IJ SOLN: 5000.0000 [IU] | INTRAMUSCULAR | Status: DC

## 2024-03-21 MED ORDER — ROCURONIUM BROMIDE 10 MG/ML (PF) SYRINGE
PREFILLED_SYRINGE | INTRAVENOUS | Status: DC | PRN
  Administered 2024-03-21 (×3): 30 mg via INTRAVENOUS
  Administered 2024-03-21: 09:00:00 40 mg via INTRAVENOUS
  Administered 2024-03-21: 08:00:00 100 mg via INTRAVENOUS
  Administered 2024-03-21: 14:00:00 20 mg via INTRAVENOUS

## 2024-03-21 MED ORDER — BUPIVACAINE LIPOSOME 1.3 % IJ SUSP: 20.0000 mL | Freq: Once | INTRAMUSCULAR | Status: DC

## 2024-03-21 MED ORDER — SODIUM CHLORIDE 0.9 % IV SOLN
2.0000 g | INTRAVENOUS | Status: DC
  Filled 2024-03-21: qty 2

## 2024-03-21 MED ORDER — OXYCODONE HCL 5 MG PO TABS: 5.0000 mg | ORAL_TABLET | Freq: Once | ORAL | Status: DC | PRN

## 2024-03-21 MED ORDER — LACTATED RINGERS IV SOLN: INTRAVENOUS | Status: DC

## 2024-03-21 MED ORDER — PHENYLEPHRINE HCL-NACL 20-0.9 MG/250ML-% IV SOLN
INTRAVENOUS | Status: DC | PRN
  Administered 2024-03-21: 08:00:00 50 ug/min via INTRAVENOUS

## 2024-03-21 MED ORDER — KETAMINE HCL 50 MG/5ML IJ SOSY
PREFILLED_SYRINGE | INTRAMUSCULAR | Status: DC | PRN
  Administered 2024-03-21 (×2): 50 mg via INTRAVENOUS

## 2024-03-21 MED ORDER — IPRATROPIUM-ALBUTEROL 0.5-2.5 (3) MG/3ML IN SOLN: 3.0000 mL | RESPIRATORY_TRACT | Status: DC | PRN

## 2024-03-21 MED ORDER — ARFORMOTEROL TARTRATE 15 MCG/2ML IN NEBU
15.0000 ug | INHALATION_SOLUTION | Freq: Two times a day (BID) | RESPIRATORY_TRACT | Status: DC
  Administered 2024-03-22 – 2024-03-23 (×3): 15 ug via RESPIRATORY_TRACT
  Filled 2024-03-21 (×3): qty 2

## 2024-03-21 MED ORDER — PHENYLEPHRINE 80 MCG/ML (10ML) SYRINGE FOR IV PUSH (FOR BLOOD PRESSURE SUPPORT)
PREFILLED_SYRINGE | INTRAVENOUS | Status: DC | PRN
  Administered 2024-03-21: 09:00:00 160 ug via INTRAVENOUS
  Administered 2024-03-21: 12:00:00 80 ug via INTRAVENOUS
  Administered 2024-03-21 (×5): 160 ug via INTRAVENOUS

## 2024-03-21 MED ORDER — ALBUTEROL SULFATE (2.5 MG/3ML) 0.083% IN NEBU: 2.5000 mg | INHALATION_SOLUTION | Freq: Four times a day (QID) | RESPIRATORY_TRACT | Status: DC | PRN

## 2024-03-21 MED ORDER — DILTIAZEM HCL ER COATED BEADS 120 MG PO CP24
120.0000 mg | ORAL_CAPSULE | Freq: Every day | ORAL | Status: DC
  Administered 2024-03-22 – 2024-03-23 (×2): 120 mg via ORAL
  Filled 2024-03-21 (×2): qty 1

## 2024-03-21 MED ORDER — ONDANSETRON HCL 4 MG/2ML IJ SOLN
INTRAMUSCULAR | Status: AC
  Filled 2024-03-21: qty 2

## 2024-03-21 MED ORDER — CEFAZOLIN SODIUM-DEXTROSE 2-4 GM/100ML-% IV SOLN: 2.0000 g | INTRAVENOUS | Status: DC

## 2024-03-21 MED ORDER — 0.9 % SODIUM CHLORIDE (POUR BTL) OPTIME
TOPICAL | Status: DC | PRN
  Administered 2024-03-21: 09:00:00 2000 mL

## 2024-03-21 MED ORDER — FENTANYL BOLUS VIA INFUSION
25.0000 ug | INTRAVENOUS | Status: DC | PRN
  Administered 2024-03-21: 18:00:00 50 ug via INTRAVENOUS

## 2024-03-21 MED ORDER — CHLORHEXIDINE GLUCONATE CLOTH 2 % EX PADS: 6.0000 | MEDICATED_PAD | Freq: Once | CUTANEOUS | Status: DC

## 2024-03-21 MED ORDER — SCOPOLAMINE 1 MG/3DAYS TD PT72
1.0000 | MEDICATED_PATCH | TRANSDERMAL | Status: DC
  Administered 2024-03-21: 07:00:00 1 mg via TRANSDERMAL
  Filled 2024-03-21: qty 1

## 2024-03-21 MED ORDER — FAMOTIDINE IN NACL 20-0.9 MG/50ML-% IV SOLN: 20.0000 mg | Freq: Two times a day (BID) | INTRAVENOUS | Status: DC

## 2024-03-21 MED ORDER — EPHEDRINE 5 MG/ML INJ
INTRAVENOUS | Status: AC
  Filled 2024-03-21: qty 5

## 2024-03-21 MED ORDER — DEXAMETHASONE SOD PHOSPHATE PF 10 MG/ML IJ SOLN
4.0000 mg | INTRAMUSCULAR | Status: AC
  Administered 2024-03-21: 09:00:00 10 mg via INTRAVENOUS

## 2024-03-21 MED ORDER — OXYCODONE HCL 5 MG/5ML PO SOLN: 5.0000 mg | Freq: Four times a day (QID) | ORAL | Status: DC | PRN

## 2024-03-21 MED ORDER — OXYCODONE HCL 5 MG/5ML PO SOLN: 5.0000 mg | Freq: Once | ORAL | Status: DC | PRN

## 2024-03-21 MED ORDER — FLUTICASONE FUROATE-VILANTEROL 200-25 MCG/ACT IN AEPB: 1.0000 | INHALATION_SPRAY | Freq: Every day | RESPIRATORY_TRACT | Status: DC

## 2024-03-21 MED ORDER — PROPOFOL 10 MG/ML IV BOLUS
INTRAVENOUS | Status: DC | PRN
  Administered 2024-03-21: 17:00:00 100 mg via INTRAVENOUS

## 2024-03-21 MED ORDER — FENTANYL CITRATE (PF) 50 MCG/ML IJ SOSY
50.0000 ug | PREFILLED_SYRINGE | Freq: Once | INTRAMUSCULAR | Status: AC
  Administered 2024-03-21: 18:00:00 50 ug via INTRAVENOUS

## 2024-03-21 MED ORDER — SODIUM CHLORIDE 0.9 % IR SOLN
Status: DC | PRN
  Administered 2024-03-21: 12:00:00 1000 mL

## 2024-03-21 MED ORDER — SODIUM CHLORIDE 0.9 % IV SOLN: 250.0000 mL | INTRAVENOUS | Status: DC

## 2024-03-21 MED ORDER — ROCURONIUM BROMIDE 10 MG/ML (PF) SYRINGE
PREFILLED_SYRINGE | INTRAVENOUS | Status: AC
  Filled 2024-03-21: qty 20

## 2024-03-21 MED ORDER — ALBUTEROL SULFATE HFA 108 (90 BASE) MCG/ACT IN AERS: 1.0000 | INHALATION_SPRAY | Freq: Four times a day (QID) | RESPIRATORY_TRACT | Status: DC | PRN

## 2024-03-21 MED ORDER — PHENYLEPHRINE 80 MCG/ML (10ML) SYRINGE FOR IV PUSH (FOR BLOOD PRESSURE SUPPORT)
PREFILLED_SYRINGE | INTRAVENOUS | Status: AC
  Filled 2024-03-21: qty 10

## 2024-03-21 MED ORDER — PHENYLEPHRINE HCL (PRESSORS) 10 MG/ML IV SOLN
INTRAVENOUS | Status: DC | PRN
  Administered 2024-03-21 (×2): 80 ug via INTRAVENOUS

## 2024-03-21 MED ORDER — PANTOPRAZOLE SODIUM 40 MG IV SOLR: 40.0000 mg | INTRAVENOUS | Status: DC

## 2024-03-21 MED ORDER — CHLORHEXIDINE GLUCONATE 0.12 % MT SOLN
OROMUCOSAL | Status: AC
  Administered 2024-03-21: 07:00:00 15 mL via OROMUCOSAL
  Filled 2024-03-21: qty 15

## 2024-03-21 MED ORDER — BUPIVACAINE-EPINEPHRINE (PF) 0.25% -1:200000 IJ SOLN
INTRAMUSCULAR | Status: AC
  Filled 2024-03-21: qty 30

## 2024-03-21 MED ORDER — PROPOFOL 1000 MG/100ML IV EMUL
5.0000 ug/kg/min | INTRAVENOUS | Status: DC
  Administered 2024-03-21: 17:00:00 20 ug/kg/min via INTRAVENOUS

## 2024-03-21 MED ORDER — INSULIN ASPART 100 UNIT/ML IJ SOLN
0.0000 [IU] | INTRAMUSCULAR | Status: DC
  Administered 2024-03-21: 20:00:00 3 [IU] via SUBCUTANEOUS
  Administered 2024-03-22 (×2): 1 [IU] via SUBCUTANEOUS
  Administered 2024-03-22: 01:00:00 3 [IU] via SUBCUTANEOUS
  Administered 2024-03-22: 15:00:00 1 [IU] via SUBCUTANEOUS
  Administered 2024-03-22: 05:00:00 2 [IU] via SUBCUTANEOUS
  Administered 2024-03-23: 06:00:00 1 [IU] via SUBCUTANEOUS
  Filled 2024-03-21: qty 1
  Filled 2024-03-21: qty 2
  Filled 2024-03-21: qty 3
  Filled 2024-03-21 (×2): qty 1
  Filled 2024-03-21: qty 3
  Filled 2024-03-21: qty 1

## 2024-03-21 MED ORDER — NOREPINEPHRINE 4 MG/250ML-% IV SOLN
INTRAVENOUS | Status: DC | PRN
  Administered 2024-03-21: 12:00:00 2 ug/min via INTRAVENOUS

## 2024-03-21 MED ORDER — FENTANYL CITRATE (PF) 250 MCG/5ML IJ SOLN
INTRAMUSCULAR | Status: AC
  Filled 2024-03-21: qty 5

## 2024-03-21 MED ORDER — ENSURE MAX PROTEIN PO LIQD
2.0000 [oz_av] | ORAL | Status: DC
  Administered 2024-03-22 – 2024-03-23 (×10): 2 [oz_av] via ORAL
  Filled 2024-03-21 (×21): qty 330

## 2024-03-21 MED ORDER — FENTANYL 2500MCG IN NS 250ML (10MCG/ML) PREMIX INFUSION
0.0000 ug/h | INTRAVENOUS | Status: DC
  Administered 2024-03-21: 18:00:00 25 ug/h via INTRAVENOUS
  Filled 2024-03-21: qty 250

## 2024-03-21 MED ORDER — BUPIVACAINE HCL (PF) 0.25 % IJ SOLN
INTRAMUSCULAR | Status: DC | PRN
  Administered 2024-03-21: 11:00:00 30 mL

## 2024-03-21 MED ORDER — ONDANSETRON HCL 4 MG/2ML IJ SOLN: 4.0000 mg | Freq: Four times a day (QID) | INTRAMUSCULAR | Status: DC | PRN

## 2024-03-21 MED ORDER — DOCUSATE SODIUM 50 MG/5ML PO LIQD
100.0000 mg | Freq: Two times a day (BID) | ORAL | Status: DC
  Administered 2024-03-22 – 2024-03-23 (×3): 100 mg
  Filled 2024-03-21 (×3): qty 10

## 2024-03-21 MED ORDER — FENTANYL CITRATE (PF) 100 MCG/2ML IJ SOLN: 25.0000 ug | INTRAMUSCULAR | Status: DC | PRN

## 2024-03-21 MED ORDER — ALPRAZOLAM 0.5 MG PO TABS: 1.0000 mg | ORAL_TABLET | Freq: Every evening | ORAL | Status: DC | PRN

## 2024-03-21 MED ORDER — DEXMEDETOMIDINE HCL IN NACL 400 MCG/100ML IV SOLN: 0.0000 ug/kg/h | INTRAVENOUS | Status: DC

## 2024-03-21 MED ORDER — ALBUMIN HUMAN 5 % IV SOLN: INTRAVENOUS | Status: DC | PRN

## 2024-03-21 MED ORDER — MIDAZOLAM HCL (PF) 2 MG/2ML IJ SOLN
INTRAMUSCULAR | Status: DC | PRN
  Administered 2024-03-21: 08:00:00 2 mg via INTRAVENOUS

## 2024-03-21 MED ORDER — ATORVASTATIN CALCIUM 40 MG PO TABS
40.0000 mg | ORAL_TABLET | Freq: Every day | ORAL | Status: DC
  Administered 2024-03-22 – 2024-03-23 (×2): 40 mg
  Filled 2024-03-21 (×2): qty 1

## 2024-03-21 MED ORDER — BUPIVACAINE HCL (PF) 0.25 % IJ SOLN
INTRAMUSCULAR | Status: AC
  Filled 2024-03-21: qty 30

## 2024-03-21 MED ORDER — SODIUM CHLORIDE 0.9 % IV SOLN: 10.0000 mL/h | Freq: Once | INTRAVENOUS | Status: AC

## 2024-03-21 MED ORDER — LIDOCAINE 2% (20 MG/ML) 5 ML SYRINGE
INTRAMUSCULAR | Status: AC
  Filled 2024-03-21: qty 5

## 2024-03-21 MED ORDER — ACETAMINOPHEN 160 MG/5ML PO SOLN: 1000.0000 mg | Freq: Three times a day (TID) | ORAL | Status: DC

## 2024-03-21 MED ORDER — PROPOFOL 10 MG/ML IV BOLUS
INTRAVENOUS | Status: DC | PRN
  Administered 2024-03-21: 08:00:00 170 mg via INTRAVENOUS

## 2024-03-21 MED ORDER — FENTANYL CITRATE (PF) 50 MCG/ML IJ SOSY
25.0000 ug | PREFILLED_SYRINGE | Freq: Once | INTRAMUSCULAR | Status: AC
  Administered 2024-03-21: 18:00:00 50 ug via INTRAVENOUS
  Filled 2024-03-21: qty 1

## 2024-03-21 MED ORDER — BUPIVACAINE HCL (PF) 0.5 % IJ SOLN
INTRAMUSCULAR | Status: AC
  Filled 2024-03-21: qty 30

## 2024-03-21 MED ORDER — MIDAZOLAM HCL 2 MG/2ML IJ SOLN
INTRAMUSCULAR | Status: AC
  Filled 2024-03-21: qty 2

## 2024-03-21 MED ORDER — CHLORHEXIDINE GLUCONATE 0.12 % MT SOLN: 15.0000 mL | Freq: Once | OROMUCOSAL | Status: AC

## 2024-03-21 MED ORDER — SIMETHICONE 80 MG PO CHEW
80.0000 mg | CHEWABLE_TABLET | Freq: Four times a day (QID) | ORAL | Status: DC | PRN
  Filled 2024-03-21: qty 1

## 2024-03-21 MED ORDER — ORAL CARE MOUTH RINSE: 15.0000 mL | Freq: Once | OROMUCOSAL | Status: AC

## 2024-03-21 MED ORDER — MORPHINE SULFATE (PF) 2 MG/ML IV SOLN: 1.0000 mg | INTRAVENOUS | Status: DC | PRN

## 2024-03-21 MED ORDER — QUETIAPINE FUMARATE 100 MG PO TABS: 300.0000 mg | ORAL_TABLET | Freq: Every day | ORAL | Status: DC

## 2024-03-21 MED ORDER — DEXTROSE-SODIUM CHLORIDE 5-0.45 % IV SOLN: INTRAVENOUS | Status: DC

## 2024-03-21 MED ORDER — PANTOPRAZOLE SODIUM 40 MG IV SOLR
40.0000 mg | Freq: Two times a day (BID) | INTRAVENOUS | Status: DC
  Administered 2024-03-21 – 2024-03-23 (×4): 40 mg via INTRAVENOUS
  Filled 2024-03-21 (×4): qty 10

## 2024-03-21 MED ORDER — SODIUM CHLORIDE 0.9 % IV SOLN
25.0000 ug/min | INTRAVENOUS | Status: DC
  Administered 2024-03-21: 17:00:00 35 ug/min via INTRAVENOUS

## 2024-03-21 MED ORDER — HYDRALAZINE HCL 20 MG/ML IJ SOLN: 10.0000 mg | INTRAMUSCULAR | Status: DC | PRN

## 2024-03-21 MED ORDER — POLYETHYLENE GLYCOL 3350 17 G PO PACK
17.0000 g | PACK | Freq: Every day | ORAL | Status: DC
  Administered 2024-03-22 – 2024-03-23 (×2): 17 g
  Filled 2024-03-21 (×2): qty 1

## 2024-03-21 MED ORDER — KETAMINE HCL 50 MG/5ML IJ SOSY
PREFILLED_SYRINGE | INTRAMUSCULAR | Status: AC
  Filled 2024-03-21: qty 10

## 2024-03-21 MED ORDER — LIDOCAINE 2% (20 MG/ML) 5 ML SYRINGE
INTRAMUSCULAR | Status: DC | PRN
  Administered 2024-03-21: 08:00:00 40 mg via INTRAVENOUS

## 2024-03-21 SURGICAL SUPPLY — 120 items
BLADE CLIPPER SURG (BLADE) ×3 IMPLANT
BLADE SURG 11 STRL SS (BLADE) ×3 IMPLANT
BUTTON OLYMPUS DEFENDO 5 PIECE (MISCELLANEOUS) IMPLANT
CANISTER SUCTION 3000ML PPV (SUCTIONS) ×6 IMPLANT
CANNULA REDUCER 12-8 DVNC XI (CANNULA) ×6 IMPLANT
CATH THORACIC 28FR (CATHETERS) IMPLANT
CHLORAPREP W/TINT 26 (MISCELLANEOUS) ×6 IMPLANT
CLIP APPLIE 5 13 M/L LIGAMAX5 (MISCELLANEOUS) IMPLANT
CNTNR URN SCR LID CUP LEK RST (MISCELLANEOUS) ×6 IMPLANT
CONN ST 1/4X3/8 BEN (MISCELLANEOUS) IMPLANT
COVER MAYO STAND STRL (DRAPES) ×3 IMPLANT
COVER SURGICAL LIGHT HANDLE (MISCELLANEOUS) ×3 IMPLANT
COVER TIP SHEARS 8 DVNC (MISCELLANEOUS) ×3 IMPLANT
DEFOGGER SCOPE WARM SEASHARP (MISCELLANEOUS) ×6 IMPLANT
DERMABOND ADVANCED .7 DNX12 (GAUZE/BANDAGES/DRESSINGS) ×6 IMPLANT
DEVICE SUTURE ENDOST 10MM (ENDOMECHANICALS) IMPLANT
DRAIN CHANNEL 28F RND 3/8 FF (WOUND CARE) IMPLANT
DRAIN PENROSE 0.5X18 (DRAIN) IMPLANT
DRAIN PENROSE 12X.25 LTX STRL (MISCELLANEOUS) IMPLANT
DRAPE ARM DVNC X/XI (DISPOSABLE) ×12 IMPLANT
DRAPE COLUMN DVNC XI (DISPOSABLE) ×3 IMPLANT
DRAPE CV SPLIT W-CLR ANES SCRN (DRAPES) ×3 IMPLANT
DRAPE INCISE IOBAN 66X45 STRL (DRAPES) IMPLANT
DRAPE SURG ORHT 6 SPLT 77X108 (DRAPES) ×3 IMPLANT
DRIVER NDL LRG 8 DVNC XI (INSTRUMENTS) ×3 IMPLANT
DRIVER NDL MEGA 8 DVNC XI (INSTRUMENTS) IMPLANT
DRIVER NDL MEGA SUTCUT DVNCXI (INSTRUMENTS) ×3 IMPLANT
DRIVER NDLE LRG 8 DVNC XI (INSTRUMENTS) ×3 IMPLANT
DRIVER NDLE MEGA DVNC XI (INSTRUMENTS) IMPLANT
DRIVER NDLE MEGA SUTCUT DVNCXI (INSTRUMENTS) ×3 IMPLANT
ELECTRODE REM PT RTRN 9FT ADLT (ELECTROSURGICAL) ×6 IMPLANT
FELT TEFLON 1X6 (MISCELLANEOUS) IMPLANT
FORCEPS BPLR FENES DVNC XI (FORCEP) IMPLANT
FORCEPS BPLR LNG DVNC XI (INSTRUMENTS) IMPLANT
FORCEPS CADIERE DVNC XI (FORCEP) ×3 IMPLANT
GAUZE KITTNER 4X5 RF (MISCELLANEOUS) ×3 IMPLANT
GAUZE SPONGE 4X4 12PLY STRL (GAUZE/BANDAGES/DRESSINGS) IMPLANT
GLOVE BIO SURGEON STRL SZ7.5 (GLOVE) ×9 IMPLANT
GLOVE BIOGEL PI IND STRL 7.0 (GLOVE) ×3 IMPLANT
GLOVE SURG SS PI 7.0 STRL IVOR (GLOVE) ×3 IMPLANT
GOWN STRL REUS W/ TWL LRG LVL3 (GOWN DISPOSABLE) ×9 IMPLANT
GOWN STRL REUS W/ TWL XL LVL3 (GOWN DISPOSABLE) ×6 IMPLANT
GOWN STRL REUS W/TWL 2XL LVL3 (GOWN DISPOSABLE) ×6 IMPLANT
GOWN STRL SURGICAL XL XLNG (GOWN DISPOSABLE) ×9 IMPLANT
GRASPER SUT TROCAR 14GX15 (MISCELLANEOUS) ×3 IMPLANT
GRASPER TIP-UP FEN DVNC XI (INSTRUMENTS) ×3 IMPLANT
HEMOSTAT SURGICEL 2X14 (HEMOSTASIS) ×9 IMPLANT
IRRIGATION STRYKERFLOW (MISCELLANEOUS) IMPLANT
IRRIGATION SUCT STRKRFLW 2 WTP (MISCELLANEOUS) IMPLANT
IRRIGATOR SUCT 8 DISP DVNC XI (IRRIGATION / IRRIGATOR) IMPLANT
IV 0.9% NACL 1000 ML (IV SOLUTION) IMPLANT
KIT BASIN OR (CUSTOM PROCEDURE TRAY) ×6 IMPLANT
KIT GASTRIC LAVAGE 34FR ADT (SET/KITS/TRAYS/PACK) ×3 IMPLANT
KIT TURNOVER KIT B (KITS) ×6 IMPLANT
LUBRICANT JELLY ST 5GR 8946 (MISCELLANEOUS) IMPLANT
MARKER SKIN DUAL TIP RULER LAB (MISCELLANEOUS) ×3 IMPLANT
MESH BIO-A 7X10 SYN MAT (Mesh General) IMPLANT
NDL 22X1.5 STRL (OR ONLY) (MISCELLANEOUS) ×6 IMPLANT
NDL INSUFFLATION 14GA 120MM (NEEDLE) ×3 IMPLANT
NDL SPNL 18GX3.5 QUINCKE PK (NEEDLE) ×3 IMPLANT
NEEDLE 22X1.5 STRL (OR ONLY) (MISCELLANEOUS) ×6 IMPLANT
NEEDLE INSUFFLATION 14GA 120MM (NEEDLE) ×3 IMPLANT
NEEDLE SPNL 18GX3.5 QUINCKE PK (NEEDLE) ×3 IMPLANT
OBTURATOR OPTICALSTD 8 DVNC (TROCAR) ×6 IMPLANT
PACK CHEST (CUSTOM PROCEDURE TRAY) ×3 IMPLANT
PAD ARMBOARD POSITIONER FOAM (MISCELLANEOUS) ×15 IMPLANT
PENCIL SMOKE EVACUATOR (MISCELLANEOUS) IMPLANT
PORT ACCESS TROCAR AIRSEAL 12 (TROCAR) ×3 IMPLANT
RELOAD STAPLE 60 2.5 WHT DVNC (STAPLE) ×9 IMPLANT
RELOAD STAPLE 60 3.5 BLU DVNC (STAPLE) IMPLANT
RETRACTOR GRSP SML 8 DVNC XI (INSTRUMENTS) IMPLANT
SCISSORS LAP 5X35 DISP (ENDOMECHANICALS) IMPLANT
SCISSORS MNPLR CVD DVNC XI (INSTRUMENTS) ×3 IMPLANT
SEAL UNIV 5-12 XI (MISCELLANEOUS) ×24 IMPLANT
SEALER SYNCHRO 8 IS4000 DVNC (MISCELLANEOUS) IMPLANT
SEALER VESSEL EXT DVNC XI (MISCELLANEOUS) ×3 IMPLANT
SET IV EXT TUBING 30 (IV SETS) ×3 IMPLANT
SET TRI-LUMEN FLTR TB AIRSEAL (TUBING) ×3 IMPLANT
SET TUBE SMOKE EVAC HIGH FLOW (TUBING) ×3 IMPLANT
SOLN 0.9% NACL POUR BTL 1000ML (IV SOLUTION) ×9 IMPLANT
SOLN STERILE WATER BTL 1000 ML (IV SOLUTION) ×3 IMPLANT
SOLUTION ELECTROSURG ANTI STCK (MISCELLANEOUS) IMPLANT
STAPLER 60 SUREFORM DVNC (STAPLE) ×3 IMPLANT
STAPLER SKIN PROX 35W (STAPLE) IMPLANT
STOPCOCK 4 WAY LG BORE MALE ST (IV SETS) ×6 IMPLANT
SUT ETHIBOND 0 36 GRN (SUTURE) ×12 IMPLANT
SUT ETHIBOND 2 0 SH 36X2 (SUTURE) IMPLANT
SUT MNCRL AB 4-0 PS2 18 (SUTURE) ×3 IMPLANT
SUT SILK 0 SH 30 (SUTURE) IMPLANT
SUT SILK 1 MH (SUTURE) ×3 IMPLANT
SUT SILK 2 0 SH (SUTURE) ×6 IMPLANT
SUT SILK 2 0SH CR/8 30 (SUTURE) IMPLANT
SUT SURGIDAC NAB ES-9 0 48 120 (SUTURE) IMPLANT
SUT VIC AB 1 CTX36XBRD ANBCTR (SUTURE) IMPLANT
SUT VIC AB 2-0 CT1 TAPERPNT 27 (SUTURE) ×3 IMPLANT
SUT VIC AB 2-0 SH 27X BRD (SUTURE) IMPLANT
SUT VIC AB 2-0 SH 27XBRD (SUTURE) IMPLANT
SUT VIC AB 3-0 SH 27X BRD (SUTURE) ×6 IMPLANT
SUT VICRYL 0 TIES 12 18 (SUTURE) ×6 IMPLANT
SUT VICRYL 0 UR6 27IN ABS (SUTURE) ×6 IMPLANT
SUTURE STRATFX SPIRAL 3-0 PDS+ (SUTURE) IMPLANT
SUTURE V-LC BRB 180 2/0GR6GS22 (SUTURE) ×6 IMPLANT
SUTURE VLOC BRB 180 ABS3/0GR12 (SUTURE) ×6 IMPLANT
SYR 10ML LL (SYRINGE) ×3 IMPLANT
SYR 20ML LL LF (SYRINGE) ×3 IMPLANT
SYR 50ML LL SCALE MARK (SYRINGE) ×3 IMPLANT
SYS ACCESS ABD 5X75MM KII FIOS (TROCAR) IMPLANT
SYSTEM SAHARA CHEST DRAIN ATS (WOUND CARE) ×3 IMPLANT
TAPE CLOTH 4X10 WHT NS (GAUZE/BANDAGES/DRESSINGS) ×3 IMPLANT
TAPE CLOTH SURG 4X10 WHT LF (GAUZE/BANDAGES/DRESSINGS) IMPLANT
TOWEL GREEN STERILE (TOWEL DISPOSABLE) ×3 IMPLANT
TOWEL GREEN STERILE FF (TOWEL DISPOSABLE) ×3 IMPLANT
TRAY FOLEY MTR SLVR 16FR STAT (SET/KITS/TRAYS/PACK) ×3 IMPLANT
TRAY LAPAROSCOPIC MC (CUSTOM PROCEDURE TRAY) ×3 IMPLANT
TROCAR BALLN 12MMX100 BLUNT (TROCAR) IMPLANT
TROCAR PORT AIRSEAL 8X120 (TROCAR) IMPLANT
TROCAR XCEL BLADELESS 5X75MML (TROCAR) ×3 IMPLANT
TROCAR XCEL NON-BLD 5MMX100MML (ENDOMECHANICALS) IMPLANT
TROCAR Z-THREAD BLADED 5X100MM (TROCAR) ×3 IMPLANT
TUBING ENDO SMARTCAP (MISCELLANEOUS) IMPLANT

## 2024-03-21 NOTE — H&P (Signed)
 RETURN WEIGHT LOSS  Preop for surgery 03/21/2024; LGB/RNY   Subjective   [name] is a 53 y.o. female established patient in today qnm:ajmpjumpr preop discussion  She has no new medications. She has no new symptoms.  Patient Active Problem List  Diagnosis  Paraesophageal hernia with gastroesophageal reflux  Gastroesophageal reflux disease without esophagitis  Hx of laparoscopic adjustable gastric banding  Morbid obesity with BMI of 45.0-49.9, adult (CMS-HCC)  Essential hypertension  Moderate mixed hyperlipidemia not requiring statin therapy  Asthma (HHS-HCC)  Primary insomnia  Major depressive disorder, single episode, unspecified  Nicotine  dependence  Palpitations  Polyneuropathy  Depression with anxiety  Psoriatic arthritis (CMS/HHS-HCC)  Syncope  Migraines  Prediabetes  Guttate psoriasis  PONV (postoperative nausea and vomiting)  Barrett's esophagus without dysplasia   Outpatient Medications Prior to Visit  Medication Sig Dispense Refill  albuterol  90 mcg/actuation inhaler Inhale 1 Puff into the lungs every 4 (four) hours as needed  ALPRAZolam  (XANAX ) 0.5 MG tablet Take 1 tablet by mouth 2 (two) times daily as needed  atorvastatin  (LIPITOR) 40 MG tablet Take 40 mg by mouth once daily  azelastine  (ASTELIN ) 137 mcg nasal spray Place 2 sprays into both nostrils as needed  baclofen  (LIORESAL ) 10 MG tablet Take 10 mg by mouth  black cohosh 540 mg Cap Take 1 tablet by mouth  buPROPion  (WELLBUTRIN  XL) 300 MG XL tablet Take 300 mg by mouth once daily  celecoxib  (CELEBREX ) 200 MG capsule Take 200 mg by mouth once daily  clobetasoL (CLOBEX) 0.05 % shampoo APPLY EVERY DAY  clopidogreL  (PLAVIX ) 75 mg tablet Take 1 tablet by mouth once daily  diclofenac  (VOLTAREN ) 1 % topical gel  famotidine  (PEPCID ) 40 MG tablet Take 1 tablet by mouth 2 (two) times daily  fluticasone  propionate (FLONASE) 50 mcg/actuation nasal spray Place 2 sprays into both nostrils once daily as needed   gabapentin  (NEURONTIN ) 300 MG capsule Take 300 mg by mouth 3 (three) times daily  levocetirizine (XYZAL ) 5 MG tablet Take 5 mg by mouth every evening  magnesium  oxide (MAG-OX) 400 mg (241.3 mg magnesium ) tablet Take 1 tablet by mouth once daily  metFORMIN (GLUCOPHAGE) 500 MG tablet Take 500 mg by mouth 2 (two) times daily with meals  multivitamin tablet Take 1 tablet by mouth once daily  nebivoloL  (BYSTOLIC ) 5 MG tablet Take 5 mg by mouth at bedtime  pantoprazole  (PROTONIX ) 40 MG DR tablet Take 40 mg by mouth 2 (two) times daily before meals  promethazine  (PHENERGAN ) 25 MG tablet Take 25 mg by mouth every 8 (eight) hours as needed  QUEtiapine  150 mg Tab Take 150 mg by mouth at bedtime  STELARA 90 mg/mL injection syringe Inject 90 mg subcutaneously every 3 (three) months Next dose due- July 6.2024  SUMAtriptan  (IMITREX ) 20 mg/actuation nasal spray Place 1 spray into the left nostril once as needed  topiramate  (TOPAMAX ) 50 MG tablet Take 50 mg by mouth 2 (two) times daily  venlafaxine  (EFFEXOR -XR) 150 MG XR capsule Take 1 capsule by mouth once daily   No facility-administered medications prior to visit.    Objective   Vitals:  02/18/24 1056  BP: 119/80  Pulse: 93  Resp: 16  Temp: 36.7 C (98 F)  SpO2: 98%  Weight: (!) 159.2 kg (351 lb)  Height: 180.3 cm (5' 11)  PainSc: 0-No pain   Body mass index is 48.95 kg/m. Physical Exam Constitutional:  Appearance: Normal appearance.  HENT:  Head: Normocephalic and atraumatic.  Pulmonary:  Effort: Pulmonary effort is normal.  Musculoskeletal:  General: Normal range of motion.  Cervical back: Normal range of motion.  Neurological:  General: No focal deficit present.  Mental Status: She is alert and oriented to person, place, and time. Mental status is at baseline.  Psychiatric:  Mood and Affect: Mood normal.  Behavior: Behavior normal.  Thought Content: Thought content normal.     I reviewed notes by [employer]. I  reviewed labs showing low iron, A1c of 6.0, otherwise normal.  Assessment/Plan:  Diagnoses and all orders for this visit:  Hiatal hernia  Morbid obesity (CMS-HCC)  Gastroesophageal reflux disease without esophagitis  Barrett's esophagus without dysplasia  Plan for combined thoracic/bariatric hiatal hernia repair with creation of RNY gastric bypass.  The patient meets weight loss surgery criteria. Due to the above reasons, I think minimally invasive RNY gastric bypass is the best option for the patient.   We discussed RNY gastric bypass. We discussed the preoperative, operative and postoperative process. I explained the surgery in detail including the performance of an EGD near the end of the surgery to test for leak. We discussed the typical hospital course including a 1-2 day stay baring any complications. The patient was given agricultural engineer. We did discuss the possibility of weight regain several years after the procedure.  The risks of infection, bleeding, pain, scarring, weight regain, too little or too much weight loss, vitamin deficiencies and need for lifelong vitamin supplementation, hair loss, need for protein supplementation, leaks, stricture, reflux, food intolerance, gallstone formation, hernia, need for reoperation, need for open surgery, injury to spleen or surrounding structures, DVT's, PE, and death again discussed with the patient and the patient expressed understanding and desires to proceed with laparoscopic Roux en Y gastric bypass, possible open, intraoperative endoscopy.  We discussed that before and after surgery that there would be an alteration in their diet. I explained that we may put them on a diet 2 weeks before surgery. I also explained that they would be on a liquid diet for 2 weeks after surgery. We discussed that they would have to avoid certain foods after surgery. We discussed the importance of physical activity as well as compliance with our dietary and  supplement recommendations and routine follow-up.

## 2024-03-21 NOTE — Progress Notes (Signed)
 PHARMACY CONSULT FOR:  Risk Assessment for Post-Discharge VTE Following Bariatric Surgery  Procedure* Laparoscopic Roux-en-Y gastric bypass Thoracoscopy Endoscopy Hiatal Hernia repair  Sex F  Black race N  Age (years) 53  BMI (kg/m2) 48.8  Operation duration (minutes) 387  History of VTE requiring treatment* N  Hypercoagulable condition* N  Liver disorder* N  Pre-op venous stasis N  Pre-op functional health status Independent   Previous foregut or bariatric surg Y (gastric band)  Post-op surgical site infection N  Transfusion intra- or post-op* N  Unplanned readmission N  Unplanned reoperation N  GI perforation/leak/obstruction* N  *specific risk factors for portomesenteric venous thrombosis   Predicted probability of 30-day post-discharge VTE:    0.8% Moderate risk estimated using the [facility] Calculator  Other patient-specific factors to consider: Noted Alpha Gal allergy, but patient has previously tolerated enoxaparin    Recommendation for Discharge: Enoxaparin  40 mg [place] q12h x 2 weeks post-discharge Benefit check requested and estimated copay is $0 Bariatric surgeon notified of recommendation.  Discharge orders have been pended.    [name] is a 53 y.o. female who underwent  laparoscopic Roux-en-Y gastric bypass on 03/21/2024.  Case start: 08:42 Case end: 15:09  Allergies[1]  Patient Measurements: Height: 5' 11 (180.3 cm) Weight: (!) 158.8 kg (350 lb) IBW/kg (Calculated) : 70.8 Body mass index is 48.82 kg/m.  No results for input(s): WBC, HGB, HCT, PLT, APTT, CREATININE, LABCREA, CREAT24HRUR, MG, PHOS, ALBUMIN , PROT, AST, ALT, ALKPHOS, BILITOT, BILIDIR, IBILI in the last 72 hours. Estimated Creatinine Clearance: 134.5 mL/min (by C-G formula based on SCr of 0.78 mg/dL).    Past Medical History:  Diagnosis Date   Anxiety    Arthritis    joints   Asthma    Endometriosis    Fat  necrosis of breast    right   GERD (gastroesophageal reflux disease)    History of hiatal hernia    s/p repair few times   History of suicide attempt 03/2017   Hypertension    MDD (major depressive disorder)    Panniculitis    Pneumonia 10/2018   in hospital with pneumonia   PONV (postoperative nausea and vomiting)    Pre-diabetes    Psoriasis    treated with Humeria   Sleep apnea    BiPap     Medications Prior to Admission  Medication Sig Dispense Refill Last Dose/Taking   acetaminophen  (TYLENOL ) 500 MG tablet Take 1,500 mg by mouth every 6 (six) hours as needed for mild pain (pain score 1-3), moderate pain (pain score 4-6), headache or fever.   03/20/2024   albuterol  (VENTOLIN  HFA) 108 (90 Base) MCG/ACT inhaler Inhale 1-2 puffs into the lungs every 6 (six) hours as needed for wheezing or shortness of breath. (Patient taking differently: Inhale 1-2 puffs into the lungs every 3 (three) hours as needed for wheezing or shortness of breath.) 6.7 g 0 03/20/2024   ALPRAZolam  (XANAX ) 1 MG tablet Take 1 mg by mouth in the morning and at bedtime.   03/21/2024 at  3:30 AM   atorvastatin  (LIPITOR) 40 MG tablet Take 1 tablet (40 mg total) by mouth daily. 30 tablet 3 03/21/2024 at  3:30 AM   azelastine  (ASTELIN ) 0.1 % nasal spray Place 2 sprays into both nostrils 2 (two) times daily as needed for rhinitis or allergies.   Taking As Needed   baclofen  (LIORESAL ) 10 MG tablet Take 1 tablet (10 mg total) by mouth 2 (two) times daily  as needed (migraine). 60 tablet 5 03/20/2024   Black Cohosh 540 MG CAPS Take 540 mg by mouth at bedtime.   Past Month   celecoxib  (CELEBREX ) 200 MG capsule Take 200 mg by mouth daily.   03/20/2024   chlorhexidine  (PERIDEX ) 0.12 % solution Use as directed 15 mLs in the mouth or throat as needed for wound care for up to 14 days. 480 mL 0 03/21/2024 Morning   clopidogrel  (PLAVIX ) 75 MG tablet Take 75 mg by mouth daily.   Past Month   diltiazem  (CARDIZEM  CD) 120 MG 24 hr  capsule Take 120 mg by mouth daily.   03/21/2024 at  3:30 AM   famotidine  (PEPCID ) 40 MG tablet Take 40 mg by mouth 2 (two) times daily.   03/21/2024 at  3:30 AM   fluticasone  (FLONASE) 50 MCG/ACT nasal spray Place 2 sprays into both nostrils daily as needed for allergies or rhinitis.   Past Month   fluticasone -salmeterol (ADVAIR HFA) 115-21 MCG/ACT inhaler Inhale 2 puffs into the lungs 2 (two) times daily. 1 each 12 03/20/2024   gabapentin  (NEURONTIN ) 300 MG capsule Take 300 mg by mouth 3 (three) times daily.   03/20/2024   levocetirizine (XYZAL ) 5 MG tablet Take 5 mg by mouth every evening.   Past Month   magnesium  oxide (MAG-OX) 400 (240 Mg) MG tablet Take 400 mg by mouth daily.   03/20/2024   Multiple Vitamin (MULTI-VITAMIN) tablet Take 1 tablet by mouth daily with breakfast.   03/20/2024   nebivolol  (BYSTOLIC ) 5 MG tablet Take 5 mg by mouth at bedtime.   03/20/2024 Bedtime   pantoprazole  (PROTONIX ) 40 MG tablet Take 40 mg by mouth in the morning and at bedtime.   03/21/2024 at  3:30 AM   promethazine  (PHENERGAN ) 25 MG tablet Take 25 mg by mouth daily as needed for nausea or vomiting.   Past Week   QUEtiapine  (SEROQUEL ) 300 MG tablet Take 300 mg by mouth at bedtime.   03/20/2024 Bedtime   SUMAtriptan  (IMITREX ) 20 MG/ACT nasal spray Place 1 spray (20 mg total) into the nose as needed for migraine or headache. May repeat in 2 hours if headache persists or recurs. 6 each 6 Past Week   tirzepatide  (MOUNJARO ) 10 MG/0.5ML Pen Inject 10 mg into the skin every 7 (seven) days. 12 mL 1 03/07/2024   venlafaxine  XR (EFFEXOR -XR) 150 MG 24 hr capsule Take 150 mg by mouth daily with breakfast.    03/21/2024 at  3:30 AM   venlafaxine  XR (EFFEXOR -XR) 75 MG 24 hr capsule Take 75 mg by mouth daily with breakfast.   03/21/2024 at  3:30 AM   topiramate  (TOPAMAX ) 50 MG tablet TAKE 1 TABLET BY MOUTH TWICE A DAY (Patient not taking: Reported on 03/15/2024) 180 tablet 1 Not Taking   ustekinumab (STELARA) 90 MG/ML SOSY  injection Inject 90 mg into the skin every 3 (three) months.   More than a month     Wanda Hasting PharmD, BCPS [facility] main pharmacy [phone] 03/21/2024 3:53 PM     [1]  Allergies Allergen Reactions   Haemophilus B Polysaccharide Vaccine Swelling and Other (See Comments)    Redness at injection site (approximatly golf ball size) hot to the touch   Lisinopril  Hives   Lisinopril -Hydrochlorothiazide  Hives and Other (See Comments)    Welts, too   Wound Dressings Hives   Alpha-Gal Diarrhea, Nausea And Vomiting and Other (See Comments)    Blisters inside of mouth, too   Bovine (Beef) Protein-Containing Drug Products  Diarrhea, Nausea And Vomiting and Other (See Comments)    Alpha-gal   Influenza Vaccines     States her arm had a knot and turned red   Adhesive [Tape] Rash and Other (See Comments)    Also, blisters   Wound Dressing Adhesive Rash and Other (See Comments)    Blisters, too

## 2024-03-21 NOTE — Op Note (Signed)
° °   °  [address] [place] [address]             [id number]     03/21/2024  Patient:  [name] Pre-Op Dx: Hiatal hernia Morbid obesity   Post-op Dx:  same Procedure: - Robotic assisted left video thoracoscopy - Lysis of adhesions - mobilization of the intrathoracic stomach - Intercostal nerve block  Surgeon and Role:      * Karol Liendo, Linnie KIDD, MD - Primary  Assistant: CHARLENA Shad, PA-C  An experienced assistant was required given the complexity of this surgery and the standard of surgical care. The assistant was needed for exposure, dissection, suctioning, retraction of delicate tissues and sutures, instrument exchange and for overall help during this procedure.    Anesthesia  general EBL:  50 ml Blood Administration: none Specimen:  none  Drains: 38 F argyle chest tube in left chest Counts: correct   Indications: 53 year old female with a large recurrent hiatal hernia.  She has had multiple hiatal operations which have all failed.  She is also morbidly obese with a BMI of 48.  She is also had wound complications from other surgeries.  She would like to proceed with surgery, and I explained to her that will plan to go into her left and right chest to mobilize her stomach out of her mediastinum.  I also explained to her that given her previous surgical history, and high likelihood of dense adhesions in her mediastinum, we will not attempt to perform any her work measures for for mobilization.  There is a high likelihood that we will need to abort if it is stuck to the back of the heart, or aorta.   Findings: Soft adhesion.  The stomach was mobilized off the aorta and pericardium without difficulty.  Operative Technique: After the risks, benefits and alternatives were thoroughly discussed, the patient was brought to the operative theatre.  Anesthesia was induced, and the patient was then placed in a lateral decubitus position and was prepped  and draped in normal sterile fashion.  An appropriate surgical pause was performed, and pre-operative antibiotics were dosed accordingly.  We began by placing our 4 robotic ports in the chest wall targeting the hiatal hernia.  A 12mm assistant port was placed in the 9th intercostal space in the anterior axillary line.  The robot was then docked and all instruments were passed under direct visualization.    The lung was then retracted superiorly, and the hernia was mobilized.  It contained stomach.  It was mobilized off the aorta and pericardium.   An intercostal nerve block was performed under direct visualization.  A 28 F chest tube was then placed, and we watch the lobes re-expand.  The skin and soft tissue were closed with absorbable suture    The patient tolerated the procedure without any immediate complications, and was transferred to the PACU in stable condition.  [name]

## 2024-03-21 NOTE — Anesthesia Procedure Notes (Signed)
 Procedure Name: Intubation Date/Time: 03/21/2024 5:08 PM  Performed by: Diesel Lina C., CRNAPre-anesthesia Checklist: Patient identified, Emergency Drugs available, Suction available, Patient being monitored and Timeout performed Patient Re-evaluated:Patient Re-evaluated prior to induction Oxygen Delivery Method: Circle system utilized Preoxygenation: Pre-oxygenation with 100% oxygen Induction Type: IV induction, Rapid sequence and Cricoid Pressure applied Laryngoscope Size: Glidescope and 3 Grade View: Grade I Tube type: Oral Tube size: 7.5 mm Number of attempts: 1 Airway Equipment and Method: Rigid stylet and Video-laryngoscopy Placement Confirmation: ETT inserted through vocal cords under direct vision, positive ETCO2 and breath sounds checked- equal and bilateral Secured at: 23 cm Tube secured with: Tape Dental Injury: Teeth and Oropharynx as per pre-operative assessment

## 2024-03-21 NOTE — Anesthesia Procedure Notes (Signed)
 Arterial Line Insertion Performed by: Maryclare Cornet, MD, Marshelle Bilger L, CRNA, CRNA  Preanesthetic checklist: patient identified, IV checked, site marked, risks and benefits discussed, surgical consent, monitors and equipment checked, pre-op evaluation, timeout performed and anesthesia consent Lidocaine  1% used for infiltration Left, radial was placed Catheter size: 20 G Hand hygiene performed  and Seldinger technique used Allen's test indicative of satisfactory collateral circulation Attempts: 1 Following insertion, Biopatch and dressing applied. Post procedure assessment: normal  Patient tolerated the procedure well with no immediate complications.

## 2024-03-21 NOTE — Progress Notes (Signed)
 [name] was retaining CO2 despite bipap. She was reintubated. -I updated [name] (family) -I consulted Pulm critical care for critical care assistance

## 2024-03-21 NOTE — Op Note (Signed)
 Preoperative diagnosis: recurrent hiatal hernia, morbid obesity  Postoperative diagnosis: same   Procedure: robotic paraesophageal hernia repair, robotic Roux-en-Y gastric bypass, upper endoscopy  Surgeon: Herlene Bureau, M.D.  Asst: Puja Maczis, PAC  Co-Surgeon: Linnie Rayas, M.D.  Anesthesia: general  Indications for procedure: [name] is a 53 y.o. year old female with symptoms of worsening reflux and dysphagia. She had a previous band, and multiple hiatal hernia repairs with recurrence. After discussing options she consented to hiatal hernia repair with thoracoscopic assistance with creation of roux-en-Y gastric bypass  Description of procedure: The patient was brought into the operative suite. Anesthesia was administered with General endotracheal anesthesia. WHO checklist was applied. The patient was then placed in supine position. The area was prepped and draped in the usual sterile fashion.  Dr. Rayas performed thoracoscopy for dissection of the sac and esophagus see his dictation for more details.  Next, a right subcostal abdominal incision was made. A 5 mm trocar was used to gain access to the peritoneal cavity by optical entry technique. Pneumoperitoneum was applied with a high flow and low pressure. The laparoscope was reinserted to confirm position. There were adhesions of the omentum to the abdominal wall in 2 areas. 1 5 mm trocar was placed in the right lateral abdomen. Adhesions were taken down with cautery and scissors.  An 8 mm trocar was placed in the left mid abdomen and an 8 mm trocar was placed in the left lateral abdomen. An 12 mm trocar was placed in the right mid abdomen. A 12 mm trocar was used to replace the right upper abdomen trocar. Bilateral TAP blocks were placed with Marcaine .  A Liver retractor was placed through the 5 mm trocar and used to retract the left lobe of the liver.  The hiatal hernia appeared large and contained the majority of the  stomach and a large . The pars flaccida was divided with harmonic scalpel The peritoneum of the right crus was divided and the sac separated from the chest contents. This plane was continued anteriorly and to the left crus. Next, the posterior area was dissected free. Additional care was used to dissect attachments to the chest to the sac and esophagus to improve mobility. A penrose was placed around the GE junction for visualization and retraction. The esophagus was completely freed from surrounding attachments. Care was taken to avoid injury to the vagus nerves. The sac was divided from the GE junction and removed.  The crus was repaired with 3 interrupted 2-0 ethibond sutures using pledgets. In addition 2-0 ethibond was used to appose the anterior left diaphragm to the left crus in interrupted fashion. Next an a-cell mesh was placed to sit behind the esophagus and sutured in place in 5 positions with 2-0 ethibond.  Next a portion along the lesser curve of the stomach was chosen approximately 7 cm below the GE junction.  Capsule sealer was used to gain access to the retrogastric area and this fashion and 5 blue load 60 mm staplers were used to create a pouch.  Next, the omentum was flipped up and the ligament of Treitz was identified.  Small intestine was counted down 50 cm and divided with a white load 60 mm stapler.  The distal intestine was counted another 100 centimeters.  At which point it was sutured to the biliary limb using 2-0 silk.  Next enterotomies were made and a anastomosis was made with a 60 mm white load stapler.  The enterotomy was closed with 2  3-0 6 inch STRATAFIX in running fashion.  Next the mesenteric defect was closed with a running 2-0 silk.  Next, the omentum was divided down to the transverse colon using vessel sealer.  Gastrotomy and enterotomy was made using cautery.  Blue load stapler was used to bring the Roux limb up to the stomach and 40 mm anastomosis was made.  The  enterotomy was closed with 2 3-0 6 inch STRATAFIX sutures in running fashion.    Next, I advanced the endoscope into the mouth and into the esophagus into the pouch and across the anastomosis. Anterior anastomosis was then reinforced with a running 2-0 silk.  Next I performed a leak test with the endoscope no leaks were seen.  Next, I sutured the mesocolon to the mesentery of the Roux limb from the left side using a 2-0 silk.  Hemostasis was inspected and intact. Pneumoperitoneum was removed. All trocars were removed. All incisions were closed with 4-0 monocryl subcuticular suture. Dermabond was placed for dressing. The patient awoke from anesthesia and was brought to pacu in stable condition.  Findings: Large hiatal hernia. patent anastomosis.  No leaks  Specimen: None  Implant: ACell mesh  Blood loss: 100 ml  Local anesthesia: 60 ml Marcaine   Complications: none  Herlene Bureau, M.D. General, Bariatric, & Minimally Invasive Surgery [facility]

## 2024-03-21 NOTE — Telephone Encounter (Signed)
 Pharmacy Patient Advocate Encounter  Insurance verification completed.    The patient is insured through [employer]. Patient has Medicare and is not eligible for a copay card, but may be able to apply for patient assistance or Medicare RX Payment Plan (Patient Must reach out to their plan, if eligible for payment plan), if available.    Ran test claim for Enoxaparin  40 MG/0.4ML injection and the current 30 day co-pay is $0.   This test claim was processed through [facility]- copay amounts may vary at other pharmacies due to [employer], or as the patient moves through the different stages of their insurance plan.

## 2024-03-21 NOTE — Consult Note (Signed)
 NAME:  [name], MRN:  [id number], DOB:  [birth year], LOS: 0 ADMISSION DATE:  03/21/2024, CONSULTATION DATE:  12/15 REFERRING MD:  Dr. Stevie, CHIEF COMPLAINT:  post op hernia repair; acute respiratory failure w/ hypercapnia   History of Present Illness:  Patient is a 53 yo F w/ pertinent PMH OSA on bipap, BMI 48, asthma, previous hiatal hernia repair, HTN MDD, anxiety presents to [facility] on 12/15 for elective hernia repair.  Hx of large recurrent hiatal hernia. Previous repairs that have all failed. On 12/15 patient admitted to [facility] for elective thoracic bariatric hiatal hernia repair and robotic roux-en-Y gastric bypass. Post op attempted to extubated. Patient was retaining co2 despite bipap trial requiring reintubation. Transferring to ICU. PCCM consulted.  Pertinent  Medical History   Past Medical History:  Diagnosis Date   Anxiety    Arthritis    joints   Asthma    Endometriosis    Fat necrosis of breast    right   GERD (gastroesophageal reflux disease)    History of hiatal hernia    s/p repair few times   History of suicide attempt 03/2017   Hypertension    MDD (major depressive disorder)    Panniculitis    Pneumonia 10/2018   in hospital with pneumonia   PONV (postoperative nausea and vomiting)    Pre-diabetes    Psoriasis    treated with Humeria   Sleep apnea    BiPap     Significant Hospital Events: Including procedures, antibiotic start and stop dates in addition to other pertinent events   12/15 admit for hiatal hernia repair; post op resp failure w/ hypercapnia requiring reintubation  Interim History / Subjective:  See above  Objective    Blood pressure 136/74, pulse 94, temperature 97.9 F (36.6 C), resp. rate 20, height 5' 11 (1.803 m), weight (!) 158.8 kg, SpO2 98%.    Vent Mode: PRVC FiO2 (%):  [90 %-100 %] 100 % Set Rate:  [20 bmp] 20 bmp Vt Set:  [560 mL] 560 mL PEEP:  [8 cmH20] 8 cmH20 Pressure Support:  [8 cmH20] 8 cmH20 Plateau  Pressure:  [20 cmH20] 20 cmH20   Intake/Output Summary (Last 24 hours) at 03/21/2024 1735 Last data filed at 03/21/2024 1723 Gross per 24 hour  Intake 3800 ml  Output 325 ml  Net 3475 ml   Filed Weights   03/21/24 0552  Weight: (!) 158.8 kg    Examination: General: obese woman, intubated, sedated HENT: [place]/AT, ET tube in place, moist mucous membranes Lungs: diminished breath sounds, no wheezing Cardiovascular: rrr, no murmurs Abdomen: soft, multiple incisions clean/dry Extremities: warm, 1+ edema Neuro: sedated, PERRL GU: foley in place  Resolved problem list   Assessment and Plan   Hiatal hernia repair Plan: -per surgery - No NG or OG tube at this time -check labs post op -avoid acidemia, hypothermia, hypocalcemia, coagulopathy  Post-Op Hypotension - transition from neo to levophed  - MAP goal 65 or greater  Acute respiratory failure w/ hypercapnia Hx of OSA on Bipap Hx asthma -failed extubation trial post op despite bipap w/ increasing co2 requiring re-intubation Plan: -cxr to confirm ett position -LTVV strategy with tidal volumes of 6-8 cc/kg ideal body weight -check ABG and adjust settings accordingly -Goal plateau pressures less than 30 and driving pressures less than 15 -Wean PEEP/FiO2 for SpO2 >92% -VAP bundle in place -Daily SAT and SBT -PAD protocol in place: propofol  and fentanyl  -wean sedation for RASS goal 0 to -1 -consider  diuresis -pulmicort  and brovana ; prn duoneb -will need to be extubated to bipap post op  HTN Plan: -hold home dilt and BB while normotensive  MDD, anxiety Plan: -hold home xanax , effexor , and seroquel  for now  Hx of barrets esophagus Plan: -PPI bid  Prediabetes Plan: -A1c -ssi and cbg monitoring  Previous TIA Plan: -hold plavix  for now -resume statin  Psoriatic arthritis Chronic pain? Plan: -hold home celebrex , baclofen  and gabapentin  for now   Labs   CBC: Recent Labs  Lab 03/17/24 1203  WBC 10.6*   HGB 13.5  HCT 43.5  MCV 94.2  PLT 247    Basic Metabolic Panel: Recent Labs  Lab 03/17/24 1203  NA 140  K 4.2  CL 104  CO2 29  GLUCOSE 111*  BUN 11  CREATININE 0.78  CALCIUM  8.8*   GFR: Estimated Creatinine Clearance: 134.5 mL/min (by C-G formula based on SCr of 0.78 mg/dL). Recent Labs  Lab 03/17/24 1203  WBC 10.6*    Liver Function Tests: Recent Labs  Lab 03/17/24 1203  AST 30  ALT 31  ALKPHOS 87  BILITOT 0.7  PROT 7.3  ALBUMIN  3.4*   No results for input(s): LIPASE, AMYLASE in the last 168 hours. No results for input(s): AMMONIA in the last 168 hours.  ABG    Component Value Date/Time   TCO2 25 03/20/2015 2022     Coagulation Profile: Recent Labs  Lab 03/17/24 1203  INR 1.0    Cardiac Enzymes: No results for input(s): CKTOTAL, CKMB, CKMBINDEX, TROPONINI in the last 168 hours.  HbA1C: Hgb A1c MFr Bld  Date/Time Value Ref Range Status  11/03/2021 06:39 AM 6.0 (H) 4.8 - 5.6 % Final    Comment:    (NOTE) Pre diabetes:          5.7%-6.4%  Diabetes:              >6.4%  Glycemic control for   <7.0% adults with diabetes   02/07/2019 03:45 AM 5.8 (H) 4.8 - 5.6 % Final    Comment:    (NOTE) Pre diabetes:          5.7%-6.4% Diabetes:              >6.4% Glycemic control for   <7.0% adults with diabetes     CBG: Recent Labs  Lab 03/17/24 1048 03/21/24 0556  GLUCAP 154* 137*    Review of Systems:   Patient is sedate and/or intubated; therefore, history has been obtained from chart review.    Past Medical History:  She,  has a past medical history of Anxiety, Arthritis, Asthma, Endometriosis, Fat necrosis of breast, GERD (gastroesophageal reflux disease), History of hiatal hernia, History of suicide attempt (03/2017), Hypertension, MDD (major depressive disorder), Panniculitis, Pneumonia (10/2018), PONV (postoperative nausea and vomiting), Pre-diabetes, Psoriasis, and Sleep apnea.   Surgical History:   Past Surgical  History:  Procedure Laterality Date   ACHILLES TENDON REPAIR Left 01-11-2015  @[facility]    BREAST CYST EXCISION Right 01/24/2019   Procedure: EXCISIN OF RIGHT BREAST FAT NECROSIS;  Surgeon: Lowery Estefana RAMAN, DO;  Location: [facility];  Service: Plastics;  Laterality: Right;   BREAST REDUCTION SURGERY Bilateral 01/09/2016   Procedure: MAMMARY REDUCTION  (BREAST) BILATERAL;  Surgeon: Estefana RAMAN Lowery, DO;  Location: [facility];  Service: Plastics;  Laterality: Bilateral;   CARPOMETACARPAL (CMC) FUSION OF THUMB Right 05-04-2015   @[facility]    COMBINED HYSTEROSCOPY DIAGNOSTIC / D&C  06-23-2003     dr austin @[facility]   D & C HYSTERSCOPY /  DX LAPAROSCOPY CONVERSION LAPAROTOMY LYSIS ADHESIONS WITH RIGHT SALPINECTOMY  12-26-2003    dr nikki  @[facility]    DEBRIDEMENT AND CLOSURE WOUND N/A 02/07/2019   Procedure: DEBRIDEMENT OF ABDOMINAL WOUND, PLACEMENT OF A-CELL, PLACEMENT OF WOUND VAC;  Surgeon: Lowery Estefana RAMAN, DO;  Location: [facility];  Service: Plastics;  Laterality: N/A;   INCISION AND DRAINAGE OF WOUND Right 06/22/2019   Procedure: Excision of abdominal wound with closure;  Surgeon: Lowery Estefana RAMAN, DO;  Location: [facility];  Service: Plastics;  Laterality: Right;  Total case time is 1 hour   LAPAROSCOPIC CHOLECYSTECTOMY  10-19-2002   dr effie  @[facility]    LAPAROSCOPIC GASTRIC BANDING  09-25-2003  @[facility]    WITH HIATAL HERNIA REPAIR   LAPAROSCOPIC LYSIS OF ADHESIONS N/A 01/24/2019   Procedure: LAPAROSCOPY WITH LYSIS OF ADHESIONS;  Surgeon: Gladis Cough, MD;  Location: [facility];  Service: General;  Laterality: N/A;   LAPAROSCOPIC REPAIR AND REMOVAL OF GASTRIC BAND  02-06-2006  @[facility]    LAPAROTOMY LYSIS ADHESIONS , UTERINE BIOPSY  01-31-2000   dr lowe  @[facility]    OPEN WOUND DEBRIDEMENT ANTERIOR ABDOMINAL WALL , REMOVAL FORGEIN BODY'S  05-10-2001   dr effie  @[facility]    PANNICULECTOMY N/A 01/24/2019   Procedure: PANNICULECTOMY;  Surgeon: Lowery Estefana RAMAN, DO;  Location: [facility];  Service:  Plastics;  Laterality: N/A;   REPAIR RECURRENT  HIATAL HERNIA   04-23-2004  @[facility]    TAKEDOWN RECURRENT HIATAL HERNIA FROM PREVIOUS MESH REPAIR OF DIAPHRAGM/ REPAIR HIATAL HERNIA AND THE GASTROPEXY  11/05/2004   TONSILLECTOMY  age 67   TOTAL ABDOMINAL HYSTERECTOMY W/ BILATERAL SALPINGOOPHORECTOMY  12-23-2005   dr soper   W/  BILATERAL URETEROLYSIS AND EXTENSIVE ABD. LYSIS ADHESIONS   UMBILICAL HERNIA REPAIR  12/27/2009   LAPAROSCOPY TAKEDOWN INCARCERATED UMBILICAL HERNIA WITH REPAIR/ OPEN REPAIR COMPLEX LOWER INCISIONAL HERNIA   VAGOTOMY  2006     Social History:   reports that she quit smoking about 8 years ago. Her smoking use included cigarettes. She started smoking about 28 years ago. She has a 20 pack-year smoking history. She has never used smokeless tobacco. She reports current alcohol use. She reports that she does not use drugs.   Family History:  Her family history includes Cancer in her maternal aunt; Diabetes in her father; Heart attack (age of onset: 82) in her father.   Allergies Allergies[1]   Home Medications  Prior to Admission medications  Medication Sig Start Date End Date Taking? Authorizing Provider  acetaminophen  (TYLENOL ) 500 MG tablet Take 1,500 mg by mouth every 6 (six) hours as needed for mild pain (pain score 1-3), moderate pain (pain score 4-6), headache or fever.   Yes [provider]  albuterol  (VENTOLIN  HFA) 108 (90 Base) MCG/ACT inhaler Inhale 1-2 puffs into the lungs every 6 (six) hours as needed for wheezing or shortness of breath. Patient taking differently: Inhale 1-2 puffs into the lungs every 3 (three) hours as needed for wheezing or shortness of breath. 04/15/21  Yes Long, Fonda MATSU, MD  ALPRAZolam  (XANAX ) 1 MG tablet Take 1 mg by mouth in the morning and at bedtime.   Yes [provider]  atorvastatin  (LIPITOR) 40 MG tablet Take 1 tablet (40 mg total) by mouth daily. 11/04/21  Yes Odell Celinda Balo, MD  azelastine  (ASTELIN ) 0.1 % nasal  spray Place 2 sprays into both nostrils 2 (two) times daily as needed for rhinitis or allergies. 11/13/21  Yes [provider]  baclofen  (LIORESAL ) 10 MG tablet Take 1 tablet (10 mg total) by mouth 2 (two) times daily as needed (migraine). 11/03/23  Yes McCue, Harlene, NP  Black Cohosh 540 MG CAPS Take 540 mg by mouth at bedtime.   Yes [provider]  celecoxib  (CELEBREX ) 200 MG capsule Take 200 mg by mouth daily. 06/09/22  Yes [provider]  chlorhexidine  (PERIDEX ) 0.12 % solution Use as directed 15 mLs in the mouth or throat as needed for wound care for up to 14 days. 02/17/24  Yes   clopidogrel  (PLAVIX ) 75 MG tablet Take 75 mg by mouth daily. 04/17/22  Yes [provider]  diltiazem  (CARDIZEM  CD) 120 MG 24 hr capsule Take 120 mg by mouth daily. 07/21/21  Yes [provider]  famotidine  (PEPCID ) 40 MG tablet Take 40 mg by mouth 2 (two) times daily.   Yes [provider]  fluticasone  (FLONASE) 50 MCG/ACT nasal spray Place 2 sprays into both nostrils daily as needed for allergies or rhinitis.   Yes [provider]  fluticasone -salmeterol (ADVAIR HFA) 115-21 MCG/ACT inhaler Inhale 2 puffs into the lungs 2 (two) times daily. 07/17/23  Yes Kara Dorn NOVAK, MD  gabapentin  (NEURONTIN ) 300 MG capsule Take 300 mg by mouth 3 (three) times daily. 01/22/19  Yes [provider]  levocetirizine (XYZAL ) 5 MG tablet Take 5 mg by mouth every evening. 09/04/22 03/21/24 Yes [provider]  magnesium  oxide (MAG-OX) 400 (240 Mg) MG tablet Take 400 mg by mouth daily.   Yes [provider]  Multiple Vitamin (MULTI-VITAMIN) tablet Take 1 tablet by mouth daily with breakfast.   Yes [provider]  nebivolol  (BYSTOLIC ) 5 MG tablet Take 5 mg by mouth at bedtime.   Yes [provider]  pantoprazole  (PROTONIX ) 40 MG tablet Take 40 mg by mouth in the morning and at bedtime.   Yes [provider]  promethazine   (PHENERGAN ) 25 MG tablet Take 25 mg by mouth daily as needed for nausea or vomiting. 09/28/20  Yes [provider]  QUEtiapine  (SEROQUEL ) 300 MG tablet Take 300 mg by mouth at bedtime.   Yes [provider]  SUMAtriptan  (IMITREX ) 20 MG/ACT nasal spray Place 1 spray (20 mg total) into the nose as needed for migraine or headache. May repeat in 2 hours if headache persists or recurs. 01/11/24  Yes McCue, Harlene, NP  tirzepatide  (MOUNJARO ) 10 MG/0.5ML Pen Inject 10 mg into the skin every 7 (seven) days. 01/18/24  Yes   venlafaxine  XR (EFFEXOR -XR) 150 MG 24 hr capsule Take 150 mg by mouth daily with breakfast.  05/04/18  Yes [provider]  venlafaxine  XR (EFFEXOR -XR) 75 MG 24 hr capsule Take 75 mg by mouth daily with breakfast.   Yes [provider]  topiramate  (TOPAMAX ) 50 MG tablet TAKE 1 TABLET BY MOUTH TWICE A DAY Patient not taking: Reported on 03/15/2024 05/25/23   [place] Harlene, NP  ustekinumab MARGARIE) 90 MG/ML SOSY injection Inject 90 mg into the skin every 3 (three) months. 11/16/19   [provider]     Critical care time: 45 minutes    Dorn Kara, MD [facility] Office: [phone]   See Amion for personal pager PCCM on call pager [phone] until 7pm. Please call Elink 7p-7a. [phone]            [1]  Allergies Allergen Reactions   Haemophilus B Polysaccharide Vaccine Swelling and Other (See Comments)    Redness at injection site (approximatly  golf ball size) hot to the touch   Lisinopril  Hives   Lisinopril -Hydrochlorothiazide  Hives and Other (See Comments)    Welts, too   Wound Dressings Hives   Alpha-Gal Diarrhea, Nausea And Vomiting and Other (See Comments)    Blisters inside of mouth, too   Bovine (Beef) Protein-Containing Drug Products Diarrhea, Nausea And Vomiting and Other (See Comments)    Alpha-gal   Influenza Vaccines     States her arm had a knot and turned red   Adhesive  [Tape] Rash and Other (See Comments)    Also, blisters   Wound Dressing Adhesive Rash and Other (See Comments)    Blisters, too

## 2024-03-21 NOTE — Anesthesia Procedure Notes (Signed)
 Central Venous Catheter Insertion Performed by: Maryclare Cornet, MD, anesthesiologist Start/End12/15/2025 7:15 AM, 03/21/2024 7:28 AM Patient location: Pre-op. Preanesthetic checklist: patient identified, IV checked, site marked, risks and benefits discussed, surgical consent, monitors and equipment checked, pre-op evaluation, timeout performed and anesthesia consent Position: Trendelenburg Lidocaine  1% used for infiltration and patient sedated Hand hygiene performed , maximum sterile barriers used  and Seldinger technique used Catheter size: 7 Fr Central line was placed.Double lumen Procedure performed using ultrasound to evaluate access site. Ultrasound Notes:relevant anatomy identified, ultrasound used to visualize needle entry and vessel patent under ultrasound. Attempts: 1 Following insertion, line sutured, dressing applied and Biopatch. Post procedure assessment: blood return through all ports, free fluid flow and no air  Patient tolerated the procedure well with no immediate complications.

## 2024-03-21 NOTE — Transfer of Care (Signed)
 Immediate Anesthesia Transfer of Care Note  Patient: [name]  Procedure(s) Performed: THORACOSCOPY, ROBOT-ASSISTED (Bilateral: Chest) LAPAROSCOPIC ROUX-EN-Y GASTRIC ENDOSCOPY, UPPER GI TRACT REPAIR, HERNIA, HIATAL, ROBOT-ASSISTED USING GORE BIO-A MESH (Chest)  Patient Location: PACU  Anesthesia Type:General  Level of Consciousness: drowsy, patient cooperative, and responds to stimulation  Airway & Oxygen Therapy: Patient Spontanous Breathing and Patient connected to face mask oxygen  Post-op Assessment: Report given to RN, Post -op Vital signs reviewed and stable, and Patient moving all extremities  Post vital signs: Reviewed and stable  Last Vitals:  Vitals Value Taken Time  BP 138/89 03/21/24 15:45  Temp    Pulse 93 03/21/24 15:51  Resp 14 03/21/24 15:51  SpO2 88 % 03/21/24 15:51  Vitals shown include unfiled device data.  Last Pain:  Vitals:   03/21/24 0639  TempSrc:   PainSc: 0-No pain      Patients Stated Pain Goal: 0 (03/21/24 9370)  Complications: There were no known notable events for this encounter.

## 2024-03-21 NOTE — Anesthesia Procedure Notes (Signed)
 Procedure Name: Intubation Date/Time: 03/21/2024 8:01 AM  Performed by: Chaney Ozell CROME, CRNAPre-anesthesia Checklist: Patient identified, Emergency Drugs available, Suction available and Patient being monitored Patient Re-evaluated:Patient Re-evaluated prior to induction Oxygen Delivery Method: Circle System Utilized Preoxygenation: Pre-oxygenation with 100% oxygen Induction Type: IV induction Ventilation: Mask ventilation without difficulty Laryngoscope Size: Mac and 4 Grade View: Grade II Endobronchial tube: Left, Double lumen EBT and EBT position confirmed by fiberoptic bronchoscope and 39 Fr Number of attempts: 1 Airway Equipment and Method: Stylet and Oral airway Placement Confirmation: ETT inserted through vocal cords under direct vision, positive ETCO2 and breath sounds checked- equal and bilateral Secured at: 38 cm Tube secured with: Tape Dental Injury: Teeth and Oropharynx as per pre-operative assessment

## 2024-03-21 NOTE — Progress Notes (Signed)
 Dr. Stevie made aware of patient's Alpha Gal allergy and that the order for Heparin  5000 units SQ comes across as a high reaction. Hold Heparin  per Dr. Stevie.

## 2024-03-21 NOTE — Interval H&P Note (Signed)
 History and Physical Interval Note:  03/21/2024 7:26 AM  [name]  has presented today for surgery, with the diagnosis of morbid obesity.  The various methods of treatment have been discussed with the patient and family. After consideration of risks, benefits and other options for treatment, the patient has consented to  Procedures with comments: THORACOSCOPY, ROBOT-ASSISTED (Bilateral) - lysis of adhesions REPAIR, HERNIA, HIATAL, ROBOT-ASSISTED (N/A) - re-do LAPAROSCOPIC ROUX-EN-Y GASTRIC (N/A) - LAPAROSCOPIC GASTRIC BYPASS RNY ENDOSCOPY, UPPER GI TRACT (N/A) as a surgical intervention.  The patient's history has been reviewed, patient examined, no change in status, stable for surgery.  I have reviewed the patient's chart and labs.  Questions were answered to the patient's satisfaction.     [name]

## 2024-03-22 ENCOUNTER — Inpatient Hospital Stay (HOSPITAL_COMMUNITY)

## 2024-03-22 ENCOUNTER — Encounter (HOSPITAL_COMMUNITY): Payer: Self-pay | Admitting: Thoracic Surgery (Cardiothoracic Vascular Surgery)

## 2024-03-22 LAB — CBC WITH DIFFERENTIAL/PLATELET
Abs Immature Granulocytes: 0.04 K/uL (ref 0.00–0.07)
Basophils Absolute: 0 K/uL (ref 0.0–0.1)
Basophils Relative: 0 %
Eosinophils Absolute: 0 K/uL (ref 0.0–0.5)
Eosinophils Relative: 0 %
HCT: 36.7 % (ref 36.0–46.0)
Hemoglobin: 11.6 g/dL — ABNORMAL LOW (ref 12.0–15.0)
Immature Granulocytes: 0 %
Lymphocytes Relative: 8 %
Lymphs Abs: 0.9 K/uL (ref 0.7–4.0)
MCH: 29.9 pg (ref 26.0–34.0)
MCHC: 31.6 g/dL (ref 30.0–36.0)
MCV: 94.6 fL (ref 80.0–100.0)
Monocytes Absolute: 0.6 K/uL (ref 0.1–1.0)
Monocytes Relative: 5 %
Neutro Abs: 9.6 K/uL — ABNORMAL HIGH (ref 1.7–7.7)
Neutrophils Relative %: 87 %
Platelets: 195 K/uL (ref 150–400)
RBC: 3.88 MIL/uL (ref 3.87–5.11)
RDW: 15.9 % — ABNORMAL HIGH (ref 11.5–15.5)
WBC: 11.1 K/uL — ABNORMAL HIGH (ref 4.0–10.5)
nRBC: 0 % (ref 0.0–0.2)

## 2024-03-22 LAB — BASIC METABOLIC PANEL WITH GFR
Anion gap: 3 — ABNORMAL LOW (ref 5–15)
BUN: 11 mg/dL (ref 6–20)
CO2: 28 mmol/L (ref 22–32)
Calcium: 8 mg/dL — ABNORMAL LOW (ref 8.9–10.3)
Chloride: 109 mmol/L (ref 98–111)
Creatinine, Ser: 0.74 mg/dL (ref 0.44–1.00)
GFR, Estimated: >60 mL/min (ref >60)
Glucose, Bld: 161 mg/dL — ABNORMAL HIGH (ref 70–99)
Potassium: 4.1 mmol/L (ref 3.5–5.1)
Sodium: 140 mmol/L (ref 135–145)

## 2024-03-22 LAB — GLUCOSE, CAPILLARY
Glucose-Capillary: 178 mg/dL — ABNORMAL HIGH (ref 70–99)
Glucose-Capillary: 140 mg/dL — ABNORMAL HIGH (ref 70–99)
Glucose-Capillary: 125 mg/dL — ABNORMAL HIGH (ref 70–99)
Glucose-Capillary: 122 mg/dL — ABNORMAL HIGH (ref 70–99)
Glucose-Capillary: 109 mg/dL — ABNORMAL HIGH (ref 70–99)
Glucose-Capillary: 117 mg/dL — ABNORMAL HIGH (ref 70–99)

## 2024-03-22 LAB — TRIGLYCERIDES: Triglycerides: 138 mg/dL (ref <150)

## 2024-03-22 LAB — MAGNESIUM: Magnesium: 1.9 mg/dL (ref 1.7–2.4)

## 2024-03-22 MED ORDER — MAGNESIUM SULFATE IN D5W 1-5 GM/100ML-% IV SOLN
1.0000 g | Freq: Once | INTRAVENOUS | Status: AC
  Administered 2024-03-22: 11:00:00 1 g via INTRAVENOUS
  Filled 2024-03-22: qty 100

## 2024-03-22 MED ORDER — CHLORHEXIDINE GLUCONATE CLOTH 2 % EX PADS
6.0000 | MEDICATED_PAD | Freq: Every day | CUTANEOUS | Status: DC
  Administered 2024-03-22: 09:00:00 6 via TOPICAL

## 2024-03-22 MED ORDER — OXYCODONE HCL 5 MG PO TABS
5.0000 mg | ORAL_TABLET | Freq: Four times a day (QID) | ORAL | Status: DC | PRN
  Administered 2024-03-22 (×2): 5 mg via ORAL
  Filled 2024-03-22 (×2): qty 1

## 2024-03-22 NOTE — Anesthesia Postprocedure Evaluation (Signed)
 Anesthesia Post Note  Patient: [name]  Procedure(s) Performed: THORACOSCOPY, ROBOT-ASSISTED (Bilateral: Chest) LAPAROSCOPIC ROUX-EN-Y GASTRIC ENDOSCOPY, UPPER GI TRACT REPAIR, HERNIA, HIATAL, ROBOT-ASSISTED USING GORE BIO-A MESH (Chest)     Patient location during evaluation: PACU Anesthesia Type: General Level of consciousness: awake and patient cooperative Pain management: pain level controlled Vital Signs Assessment: post-procedure vital signs reviewed and stable Respiratory status: spontaneous breathing (Pt connected to BiPAP) Cardiovascular status: blood pressure returned to baseline and stable Postop Assessment: no apparent nausea or vomiting Anesthetic complications: no Comments: Pt placed on BIPAP upon arrival to PACU.  ABG revealed respiratory acidosis with CO2 65.  Pt continues to be drowsy with inability to clear the CO2.  Decision to reintubate was made after discussion with Dr Stevie.  Pt was transferred to ICU with ventilator support.   There were no known notable events for this encounter.  Last Vitals:  Vitals:   03/22/24 0600 03/22/24 0615  BP: (!) 91/55   Pulse: 89 89  Resp: (!) 21 20  Temp:    SpO2: 98% 98%    Last Pain:  Vitals:   03/22/24 0330  TempSrc: Axillary  PainSc:                  [name]

## 2024-03-22 NOTE — Discharge Instructions (Signed)

## 2024-03-22 NOTE — TOC Initial Note (Signed)
 Transition of Care [facility]) - Initial/Assessment Note    Patient Details  Name: [name] MRN: [id number] Date of Birth: Jun 20, 1970  [facility]) CM/SW Contact:    Justina Delcia Czar, RN Phone Number: [phone] 03/22/2024, 6:05 PM  Clinical Narrative:                  Spoke to pt and SO at bedside. States she was independent pta. Drives to appts. Uses her Bipap at home. Has RW but not sure if she will be able to locate. Pt wearing oxygen in the room. Will evaluate if oxygen will be needed at dc.   Will schedule hospital follow up appt with PCP at dc.   SO will provide transportation home.   Expected Discharge Plan: Home/Self Care Barriers to Discharge: Continued Medical Work up   Patient Goals and CMS Choice Patient states their goals for this hospitalization and ongoing recovery are:: wants to remain independent      Expected Discharge Plan and Services   Discharge Planning Services: CM Consult   Living arrangements for the past 2 months: Single Family Home                                      Prior Living Arrangements/Services Living arrangements for the past 2 months: Single Family Home Lives with:: Parents Patient language and need for interpreter reviewed:: Yes Do you feel safe going back to the place where you live?: Yes      Need for Family Participation in Patient Care: No (Comment) Care giver support system in place?: Yes (comment) Current home services: DME (rolling walker, bipap) Criminal Activity/Legal Involvement Pertinent to Current Situation/Hospitalization: No - Comment as needed  Activities of Daily Living   ADL Screening (condition at time of admission) Independently performs ADLs?: Yes (appropriate for developmental age) Is the patient deaf or have difficulty hearing?: No Does the patient have difficulty seeing, even when wearing glasses/contacts?: No Does the patient have difficulty concentrating, remembering, or  making decisions?: No  Permission Sought/Granted Permission sought to share information with : Case Manager, Family Supports, PCP Permission granted to share information with : Yes, Verbal Permission Granted  Share Information with NAME: Jerel Sharps  Permission granted to share info w AGENCY: DME, PCP  Permission granted to share info w Relationship: SO  Permission granted to share info w Contact Information: [phone]  Emotional Assessment Appearance:: Appears stated age Attitude/Demeanor/Rapport: Engaged Affect (typically observed): Accepting Orientation: : Oriented to Self, Oriented to Place, Oriented to  Time, Oriented to Situation   Psych Involvement: No (comment)  Admission diagnosis:  Status post robot-assisted surgical procedure [Z98.890] Hiatal hernia [K44.9] Patient Active Problem List   Diagnosis Date Noted   Status post robot-assisted surgical procedure 03/21/2024   Hiatal hernia 05/22/2023   Acute respiratory failure with hypoxia (HCC) 03/06/2023   Aspiration pneumonia (HCC) 10/03/2022   Morbid (severe) obesity due to excess calories (HCC) 11/03/2021   Insomnia 11/03/2021   Polyneuropathy 11/03/2021   Acute CVA (cerebrovascular accident) (HCC) 11/02/2021   S/P panniculectomy 07/19/2019   Wound dehiscence 02/06/2019   Pneumonia 09/23/2018   Depression with anxiety 09/23/2018   Psoriasis 09/23/2018   Tachyarrhythmia    Ventral hernia 06/09/2018   Breast, fat necrosis 05/11/2018   Posttraumatic stress disorder 04/05/2018   Panniculitis 03/10/2018   Major depressive disorder, single episode, unspecified 04/02/2017   MDD (major  depressive disorder), recurrent episode, severe (HCC) 03/24/2017   Symptomatic mammary hypertrophy 01/09/2016   Nicotine  dependence 12/25/2015   PCP:  Nena Rosina CROME, PA-C Pharmacy:   [facility] [place], [place] - [address] [place] [address] Phone: [phone] Fax: [phone]  MEDCENTER  [facility] [address] [place] [address] Phone: [phone] Fax: [phone]  [facility] [address] [place] [address] Phone: [phone] Fax: [phone]     Social Drivers of Health (SDOH) Social History: SDOH Screenings   Food Insecurity: No Food Insecurity (12/05/2023)   Received from [employer]  Housing: Low Risk (12/05/2023)   Received from [facility]  Transportation Needs: No Transportation Needs (12/05/2023)   Received from [facility]  Utilities: Not At Risk (12/05/2023)   Received from [facility]  Depression (PHQ2-9): Low Risk (07/02/2023)  Financial Resource Strain: Low Risk (12/05/2023)   Received from [facility]  Physical Activity: Insufficiently Active (12/05/2023)   Received from [facility]  Social Connections: Socially Integrated (12/05/2023)   Received from [facility]  Stress: Stress Concern Present (12/05/2023)   Received from [facility]  Tobacco Use: Medium Risk (03/21/2024)  Health Literacy: Adequate Health Literacy (09/10/2022)   Received from [facility]   SDOH Interventions:     Readmission Risk Interventions     No data to display

## 2024-03-22 NOTE — Procedures (Signed)
 Extubation Procedure Note  Patient Details:   Name: LENETTA PICHE DOB: 07/02/1970 MRN: [id number]   Airway Documentation:    Vent end date: 03/22/24 Vent end time: 0828   Evaluation  O2 sats: stable throughout Complications: No apparent complications Patient did tolerate procedure well. Bilateral Breath Sounds: Clear, Diminished   Yes  Patient extubated to [place] per MD order.  Positive cuff leak noted.  No evidence of stridor.  Patient able to speak post extubation.  Sats and vitals are currently stable.  No complications noted at this time.   Leotis KATHEE Benders 03/22/2024, 8:30 AM

## 2024-03-22 NOTE — Progress Notes (Signed)
°  1 Day Post-Op   Chief Complaint/Subjective: Reintubated in PACU, no acute changes overnight  Objective: Vital signs in last 24 hours: Temp:  [97.6 F (36.4 C)-98.3 F (36.8 C)] 97.8 F (36.6 C) (12/16 0330) Pulse Rate:  [88-97] 89 (12/16 0615) Resp:  [13-21] 20 (12/16 0615) BP: (91-148)/(55-93) 91/55 (12/16 0600) SpO2:  [88 %-99 %] 98 % (12/16 0615) Arterial Line BP: (75-148)/(51-77) 117/59 (12/16 0615) FiO2 (%):  [60 %-100 %] 60 % (12/16 0400) Weight:  [162.6 kg] 162.6 kg (12/16 0615)   Intake/Output from previous day: 12/15 0701 - 12/16 0700 In: 5643.2 [I.V.:4943.2; IV Piggyback:700] Out: 2290 [Urine:1950; Chest Tube:340]  PE: Gen: sedated Resp: assisted Card: RRR, L chest tube with thin bloody output Abd: soft, incisions c/d/i  Lab Results:  Recent Labs    03/21/24 1929 03/21/24 1955 03/22/24 0505  WBC 14.8*  --  11.1*  HGB 12.1 12.6 11.6*  HCT 38.7 37.0 36.7  PLT 210  --  195   Recent Labs    03/21/24 1929 03/21/24 1955 03/22/24 0505  NA 138 140 140  K 4.6 4.6 4.1  CL 104  --  109  CO2 22  --  28  GLUCOSE 201*  --  161*  BUN 15  --  11  CREATININE 0.88  --  0.74  CALCIUM  8.2*  --  8.0*   Recent Labs    03/21/24 1929  LABPROT 14.9  INR 1.1      Component Value Date/Time   NA 140 03/22/2024 0505   K 4.1 03/22/2024 0505   CL 109 03/22/2024 0505   CO2 28 03/22/2024 0505   GLUCOSE 161 (H) 03/22/2024 0505   BUN 11 03/22/2024 0505   CREATININE 0.74 03/22/2024 0505   CALCIUM  8.0 (L) 03/22/2024 0505   PROT 6.4 (L) 03/21/2024 1929   ALBUMIN  3.2 (L) 03/21/2024 1929   AST 48 (H) 03/21/2024 1929   ALT 33 03/21/2024 1929   ALKPHOS 66 03/21/2024 1929   BILITOT 0.6 03/21/2024 1929   GFRNONAA >60 03/22/2024 0505   GFRAA >60 02/07/2019 0345    Assessment/Plan  s/p Procedures: THORACOSCOPY, ROBOT-ASSISTED LAPAROSCOPIC ROUX-EN-Y GASTRIC ENDOSCOPY, UPPER GI TRACT REPAIR, HERNIA, HIATAL, ROBOT-ASSISTED USING GORE BIO-A MESH 03/21/2024  -WBC  downtrending appropriately, no tachycardia or fever  Hypercarbic respiratory failure/OSA -PCCM for critical care and vent management  Prediabetes -SSI  FEN - liquids once extubated VTE - lovenox  ID - periop abx complete Disposition - ICU   LOS: 1 day   I reviewed last 24 h vitals and pain scores, last 48 h intake and output, last 24 h labs and trends, and last 24 h imaging results.  This care required high  level of medical decision making.   Herlene Righter [facility] 03/22/2024, 7:25 AM Please see Amion for pager number during day hours 7:00am-4:30pm or 7:00am -11:30am on weekends

## 2024-03-22 NOTE — Progress Notes (Signed)
 NAME:  [name], MRN:  [id number], DOB:  11/26/1970, LOS: 1 ADMISSION DATE:  03/21/2024, CONSULTATION DATE:  12/15 REFERRING MD:  Dr. Stevie, CHIEF COMPLAINT:  post op hernia repair; acute respiratory failure w/ hypercapnia   History of Present Illness:  Patient is a 54 yo F w/ pertinent PMH OSA on bipap, BMI 48, asthma, previous hiatal hernia repair, HTN MDD, anxiety presents to [facility] on 12/15 for elective hernia repair.  Hx of large recurrent hiatal hernia. Previous repairs that have all failed. On 12/15 patient admitted to [facility] for elective thoracic bariatric hiatal hernia repair and robotic roux-en-Y gastric bypass. Post op attempted to extubated. Patient was retaining co2 despite bipap trial requiring reintubation. Transferring to ICU. PCCM consulted.  Pertinent  Medical History   Past Medical History:  Diagnosis Date   Anxiety    Arthritis    joints   Asthma    Endometriosis    Fat necrosis of breast    right   GERD (gastroesophageal reflux disease)    History of hiatal hernia    s/p repair few times   History of suicide attempt 03/2017   Hypertension    MDD (major depressive disorder)    Panniculitis    Pneumonia 10/2018   in hospital with pneumonia   PONV (postoperative nausea and vomiting)    Pre-diabetes    Psoriasis    treated with [place]   Sleep apnea    BiPap     Significant Hospital Events: Including procedures, antibiotic start and stop dates in addition to other pertinent events   12/15 admit for hiatal hernia repair; post op resp failure w/ hypercapnia requiring reintubation  Interim History / Subjective:  See above  Objective    Blood pressure 107/84, pulse 94, temperature 98.6 F (37 C), temperature source Axillary, resp. rate 20, height 5' 11 (1.803 m), weight (!) 162.6 kg, SpO2 97%.    Vent Mode: PRVC FiO2 (%):  [60 %-100 %] 60 % Set Rate:  [20 bmp] 20 bmp Vt Set:  [560 mL-580 mL] 580 mL PEEP:  [8 cmH20] 8 cmH20 Pressure Support:   [8 cmH20] 8 cmH20 Plateau Pressure:  [20 cmH20-24 cmH20] 20 cmH20   Intake/Output Summary (Last 24 hours) at 03/22/2024 0820 Last data filed at 03/22/2024 0800 Gross per 24 hour  Intake 5857.21 ml  Output 2490 ml  Net 3367.21 ml   Filed Weights   03/21/24 0552 03/22/24 0615  Weight: (!) 158.8 kg (!) 162.6 kg    Examination: General: obese woman, intubated, sedated HENT: [place]/AT, ET tube in place, moist mucous membranes Lungs: diminished breath sounds, no wheezing Cardiovascular: rrr, no murmurs Abdomen: soft, multiple incisions clean/dry Extremities: warm, 1+ edema Neuro: sedated, PERRL GU: foley in place  Resolved problem list   Assessment and Plan   Hiatal hernia repair -plan per surgery -avoid acidemia, hypothermia, hypocalcemia, coagulopathy -chest tube out after extubation per TCTS  -can likely transition out of the ICU today  Post-Op Hypotension -weaned off pressors   Acute respiratory failure w/ hypercapnia Hx of OSA on Bipap Hx asthma -failed extubation trial post op despite bipap w/ increasing co2 requiring re-intubation -extubated without difficulty -Bipap at bedtime   HTN -hold home dilt and BB, monitor BP for now and resume prn   MDD, anxiety -monitor respiratory status post-extubation, resume seroquel  and home xanax  prn and if stable  Hx of barrets esophagus -PPI bid -swallow screen and resume diet if passes   Prediabetes -A1c -ssi and cbg  monitoring  Previous TIA -hold plavix  for now, resume per surgery  -resume statin  Psoriatic arthritis Chronic pain? -hold home celebrex , baclofen  and gabapentin  for now, resume later today if mental and respiratory status stable    Labs   CBC: Recent Labs  Lab 03/17/24 1203 03/21/24 1929 03/21/24 1955 03/22/24 0505  WBC 10.6* 14.8*  --  11.1*  NEUTROABS  --  13.6*  --  9.6*  HGB 13.5 12.1 12.6 11.6*  HCT 43.5 38.7 37.0 36.7  MCV 94.2 95.3  --  94.6  PLT 247 210  --  195    Basic  Metabolic Panel: Recent Labs  Lab 03/17/24 1203 03/21/24 1929 03/21/24 1955 03/22/24 0505  NA 140 138 140 140  K 4.2 4.6 4.6 4.1  CL 104 104  --  109  CO2 29 22  --  28  GLUCOSE 111* 201*  --  161*  BUN 11 15  --  11  CREATININE 0.78 0.88  --  0.74  CALCIUM  8.8* 8.2*  --  8.0*  MG  --  1.8  --  1.9   GFR: Estimated Creatinine Clearance: 136.4 mL/min (by C-G formula based on SCr of 0.74 mg/dL). Recent Labs  Lab 03/17/24 1203 03/21/24 1929 03/22/24 0505  WBC 10.6* 14.8* 11.1*    Liver Function Tests: Recent Labs  Lab 03/17/24 1203 03/21/24 1929  AST 30 48*  ALT 31 33  ALKPHOS 87 66  BILITOT 0.7 0.6  PROT 7.3 6.4*  ALBUMIN  3.4* 3.2*   No results for input(s): LIPASE, AMYLASE in the last 168 hours. No results for input(s): AMMONIA in the last 168 hours.  ABG    Component Value Date/Time   PHART 7.344 (L) 03/21/2024 1955   PCO2ART 43.4 03/21/2024 1955   PO2ART 201 (H) 03/21/2024 1955   HCO3 23.7 03/21/2024 1955   TCO2 25 03/21/2024 1955   ACIDBASEDEF 2.0 03/21/2024 1955   O2SAT 100 03/21/2024 1955     Coagulation Profile: Recent Labs  Lab 03/17/24 1203 03/21/24 1929  INR 1.0 1.1    Cardiac Enzymes: No results for input(s): CKTOTAL, CKMB, CKMBINDEX, TROPONINI in the last 168 hours.  HbA1C: Hgb A1c MFr Bld  Date/Time Value Ref Range Status  03/21/2024 07:29 PM 5.9 (H) 4.8 - 5.6 % Final    Comment:    (NOTE) Diagnosis of Diabetes The following HbA1c ranges recommended by the American Diabetes Association (ADA) may be used as an aid in the diagnosis of diabetes mellitus.  Hemoglobin             Suggested A1C NGSP%              Diagnosis  <5.7                   Non Diabetic  5.7-6.4                Pre-Diabetic  >6.4                   Diabetic  <7.0                   Glycemic control for                       adults with diabetes.    11/03/2021 06:39 AM 6.0 (H) 4.8 - 5.6 % Final    Comment:    (NOTE) Pre diabetes:  5.7%-6.4%  Diabetes:              >6.4%  Glycemic control for   <7.0% adults with diabetes     CBG: Recent Labs  Lab 03/21/24 0556 03/21/24 1928 03/21/24 2352 03/22/24 0358 03/22/24 0726  GLUCAP 137* 201* 212* 178* 140*    Review of Systems:   Patient is sedate and/or intubated; therefore, history has been obtained from chart review.    Past Medical History:  She,  has a past medical history of Anxiety, Arthritis, Asthma, Endometriosis, Fat necrosis of breast, GERD (gastroesophageal reflux disease), History of hiatal hernia, History of suicide attempt (03/2017), Hypertension, MDD (major depressive disorder), Panniculitis, Pneumonia (10/2018), PONV (postoperative nausea and vomiting), Pre-diabetes, Psoriasis, and Sleep apnea.   Surgical History:   Past Surgical History:  Procedure Laterality Date   ACHILLES TENDON REPAIR Left 01-11-2015  @[facility]    BREAST CYST EXCISION Right 01/24/2019   Procedure: EXCISIN OF RIGHT BREAST FAT NECROSIS;  Surgeon: Lowery Estefana RAMAN, DO;  Location: [facility];  Service: Plastics;  Laterality: Right;   BREAST REDUCTION SURGERY Bilateral 01/09/2016   Procedure: MAMMARY REDUCTION  (BREAST) BILATERAL;  Surgeon: Estefana RAMAN Lowery, DO;  Location: [facility];  Service: Plastics;  Laterality: Bilateral;   CARPOMETACARPAL (CMC) FUSION OF THUMB Right 05-04-2015   @[facility]    COMBINED HYSTEROSCOPY DIAGNOSTIC / D&C  06-23-2003     dr austin @[facility]    D & C HYSTERSCOPY /  DX LAPAROSCOPY CONVERSION LAPAROTOMY LYSIS ADHESIONS WITH RIGHT SALPINECTOMY  12-26-2003    dr nikki  @[facility]    DEBRIDEMENT AND CLOSURE WOUND N/A 02/07/2019   Procedure: DEBRIDEMENT OF ABDOMINAL WOUND, PLACEMENT OF A-CELL, PLACEMENT OF WOUND VAC;  Surgeon: Lowery Estefana RAMAN, DO;  Location: [facility];  Service: Plastics;  Laterality: N/A;   INCISION AND DRAINAGE OF WOUND Right 06/22/2019   Procedure: Excision of abdominal wound with closure;  Surgeon: Lowery Estefana RAMAN, DO;  Location:  [facility];  Service: Plastics;  Laterality: Right;  Total case time is 1 hour   LAPAROSCOPIC CHOLECYSTECTOMY  10-19-2002   dr effie  @[facility]    LAPAROSCOPIC GASTRIC BANDING  09-25-2003  @[facility]    WITH HIATAL HERNIA REPAIR   LAPAROSCOPIC LYSIS OF ADHESIONS N/A 01/24/2019   Procedure: LAPAROSCOPY WITH LYSIS OF ADHESIONS;  Surgeon: Gladis Cough, MD;  Location: [facility];  Service: General;  Laterality: N/A;   LAPAROSCOPIC REPAIR AND REMOVAL OF GASTRIC BAND  02-06-2006  @[facility]    LAPAROTOMY LYSIS ADHESIONS , UTERINE BIOPSY  01-31-2000   dr lowe  @[facility]    OPEN WOUND DEBRIDEMENT ANTERIOR ABDOMINAL WALL , REMOVAL FORGEIN BODY'S  05-10-2001   dr effie  @[facility]    PANNICULECTOMY N/A 01/24/2019   Procedure: PANNICULECTOMY;  Surgeon: Lowery Estefana RAMAN, DO;  Location: [facility];  Service: Plastics;  Laterality: N/A;   REPAIR RECURRENT  HIATAL HERNIA   04-23-2004  @[facility]    TAKEDOWN RECURRENT HIATAL HERNIA FROM PREVIOUS MESH REPAIR OF DIAPHRAGM/ REPAIR HIATAL HERNIA AND THE GASTROPEXY  11/05/2004   TONSILLECTOMY  age 40   TOTAL ABDOMINAL HYSTERECTOMY W/ BILATERAL SALPINGOOPHORECTOMY  12-23-2005   dr soper   W/  BILATERAL URETEROLYSIS AND EXTENSIVE ABD. LYSIS ADHESIONS   UMBILICAL HERNIA REPAIR  12/27/2009   LAPAROSCOPY TAKEDOWN INCARCERATED UMBILICAL HERNIA WITH REPAIR/ OPEN REPAIR COMPLEX LOWER INCISIONAL HERNIA   VAGOTOMY  2006     Social History:   reports that she quit smoking about 8 years ago. Her smoking use included cigarettes. She started smoking about 28  years ago. She has a 20 pack-year smoking history. She has never used smokeless tobacco. She reports current alcohol use. She reports that she does not use drugs.   Family History:  Her family history includes Cancer in her maternal aunt; Diabetes in her father; Heart attack (age of onset: 48) in her father.   Allergies Allergies[1]   Home Medications  Prior to Admission medications  Medication Sig Start Date End Date Taking? Authorizing  Provider  acetaminophen  (TYLENOL ) 500 MG tablet Take 1,500 mg by mouth every 6 (six) hours as needed for mild pain (pain score 1-3), moderate pain (pain score 4-6), headache or fever.   Yes [provider]  albuterol  (VENTOLIN  HFA) 108 (90 Base) MCG/ACT inhaler Inhale 1-2 puffs into the lungs every 6 (six) hours as needed for wheezing or shortness of breath. Patient taking differently: Inhale 1-2 puffs into the lungs every 3 (three) hours as needed for wheezing or shortness of breath. 04/15/21  Yes Long, Fonda MATSU, MD  ALPRAZolam  (XANAX ) 1 MG tablet Take 1 mg by mouth in the morning and at bedtime.   Yes [provider]  atorvastatin  (LIPITOR) 40 MG tablet Take 1 tablet (40 mg total) by mouth daily. 11/04/21  Yes Odell Celinda Balo, MD  azelastine  (ASTELIN ) 0.1 % nasal spray Place 2 sprays into both nostrils 2 (two) times daily as needed for rhinitis or allergies. 11/13/21  Yes [provider]  baclofen  (LIORESAL ) 10 MG tablet Take 1 tablet (10 mg total) by mouth 2 (two) times daily as needed (migraine). 11/03/23  Yes McCue, Harlene, NP  Black Cohosh 540 MG CAPS Take 540 mg by mouth at bedtime.   Yes [provider]  celecoxib  (CELEBREX ) 200 MG capsule Take 200 mg by mouth daily. 06/09/22  Yes [provider]  chlorhexidine  (PERIDEX ) 0.12 % solution Use as directed 15 mLs in the mouth or throat as needed for wound care for up to 14 days. 02/17/24  Yes   clopidogrel  (PLAVIX ) 75 MG tablet Take 75 mg by mouth daily. 04/17/22  Yes [provider]  diltiazem  (CARDIZEM  CD) 120 MG 24 hr capsule Take 120 mg by mouth daily. 07/21/21  Yes [provider]  famotidine  (PEPCID ) 40 MG tablet Take 40 mg by mouth 2 (two) times daily.   Yes [provider]  fluticasone  (FLONASE) 50 MCG/ACT nasal spray Place 2 sprays into both nostrils daily as needed for allergies or rhinitis.   Yes [provider]  fluticasone -salmeterol (ADVAIR HFA) 115-21  MCG/ACT inhaler Inhale 2 puffs into the lungs 2 (two) times daily. 07/17/23  Yes Kara Dorn NOVAK, MD  gabapentin  (NEURONTIN ) 300 MG capsule Take 300 mg by mouth 3 (three) times daily. 01/22/19  Yes [provider]  levocetirizine (XYZAL ) 5 MG tablet Take 5 mg by mouth every evening. 09/04/22 03/21/24 Yes [provider]  magnesium  oxide (MAG-OX) 400 (240 Mg) MG tablet Take 400 mg by mouth daily.   Yes [provider]  Multiple Vitamin (MULTI-VITAMIN) tablet Take 1 tablet by mouth daily with breakfast.   Yes [provider]  nebivolol  (BYSTOLIC ) 5 MG tablet Take 5 mg by mouth at bedtime.   Yes [provider]  pantoprazole  (PROTONIX ) 40 MG tablet Take 40 mg by mouth in the morning and at bedtime.   Yes [provider]  promethazine  (PHENERGAN ) 25 MG tablet Take 25 mg by mouth daily as needed for nausea or vomiting. 09/28/20  Yes [provider]  QUEtiapine  (SEROQUEL ) 300  MG tablet Take 300 mg by mouth at bedtime.   Yes [provider]  SUMAtriptan  (IMITREX ) 20 MG/ACT nasal spray Place 1 spray (20 mg total) into the nose as needed for migraine or headache. May repeat in 2 hours if headache persists or recurs. 01/11/24  Yes McCue, Harlene, NP  tirzepatide  (MOUNJARO ) 10 MG/0.5ML Pen Inject 10 mg into the skin every 7 (seven) days. 01/18/24  Yes   venlafaxine  XR (EFFEXOR -XR) 150 MG 24 hr capsule Take 150 mg by mouth daily with breakfast.  05/04/18  Yes [provider]  venlafaxine  XR (EFFEXOR -XR) 75 MG 24 hr capsule Take 75 mg by mouth daily with breakfast.   Yes [provider]  topiramate  (TOPAMAX ) 50 MG tablet TAKE 1 TABLET BY MOUTH TWICE A DAY Patient not taking: Reported on 03/15/2024 05/25/23   [place] Harlene, NP  ustekinumab MARGARIE) 90 MG/ML SOSY injection Inject 90 mg into the skin every 3 (three) months. 11/16/19   [provider]     Critical care time: 45 minutes    Dorn Chill, MD [place]  Pulmonary & Critical Care Office: [phone]   See Amion for personal pager PCCM on call pager [phone] until 7pm. Please call Elink 7p-7a. [phone]             [1]  Allergies Allergen Reactions   Haemophilus B Polysaccharide Vaccine Swelling and Other (See Comments)    Redness at injection site (approximatly golf ball size) hot to the touch   Lisinopril  Hives   Lisinopril -Hydrochlorothiazide  Hives and Other (See Comments)    Welts, too   Wound Dressings Hives   Alpha-Gal Diarrhea, Nausea And Vomiting and Other (See Comments)    Blisters inside of mouth, too Has tolerated lovenox    Bovine (Beef) Protein-Containing Drug Products Diarrhea, Nausea And Vomiting and Other (See Comments)    Alpha-gal   Influenza Vaccines     States her arm had a knot and turned red   Adhesive [Tape] Rash and Other (See Comments)    Also, blisters   Wound Dressing Adhesive Rash and Other (See Comments)    Blisters, too

## 2024-03-22 NOTE — Progress Notes (Addendum)
° °   °  [address] [employer] [address]             [phone]         1 Day Post-Op Procedures (LRB): THORACOSCOPY, ROBOT-ASSISTED (Bilateral) LAPAROSCOPIC ROUX-EN-Y GASTRIC (N/A) ENDOSCOPY, UPPER GI TRACT (N/A) REPAIR, HERNIA, HIATAL, ROBOT-ASSISTED USING GORE BIO-A MESH (N/A)  Subjective:  Patient re-intubated overnight  Objective: Vital signs in last 24 hours: Temp:  [97.6 F (36.4 C)-98.6 F (37 C)] 98.6 F (37 C) (12/16 0700) Pulse Rate:  [88-97] 89 (12/16 0615) Cardiac Rhythm: Normal sinus rhythm (12/15 2008) Resp:  [13-21] 20 (12/16 0615) BP: (91-148)/(55-93) 91/55 (12/16 0600) SpO2:  [88 %-99 %] 98 % (12/16 0615) Arterial Line BP: (75-148)/(51-77) 117/59 (12/16 0615) FiO2 (%):  [60 %-100 %] 60 % (12/16 0400) Weight:  [162.6 kg] 162.6 kg (12/16 0615)  Intake/Output from previous day: 12/15 0701 - 12/16 0700 In: 5643.2 [I.V.:4943.2; IV Piggyback:700] Out: 2290 [Urine:1950; Chest Tube:340]  General appearance: intubated Heart: regular rate and rhythm, S1, S2 normal, no murmur, click, rub or gallop Lungs: ventilatory sounds Wound: clean and dry  Lab Results: Recent Labs    03/21/24 1929 03/21/24 1955 03/22/24 0505  WBC 14.8*  --  11.1*  HGB 12.1 12.6 11.6*  HCT 38.7 37.0 36.7  PLT 210  --  195   BMET:  Recent Labs    03/21/24 1929 03/21/24 1955 03/22/24 0505  NA 138 140 140  K 4.6 4.6 4.1  CL 104  --  109  CO2 22  --  28  GLUCOSE 201*  --  161*  BUN 15  --  11  CREATININE 0.88  --  0.74  CALCIUM  8.2*  --  8.0*    PT/INR:  Recent Labs    03/21/24 1929  LABPROT 14.9  INR 1.1   ABG    Component Value Date/Time   PHART 7.344 (L) 03/21/2024 1955   HCO3 23.7 03/21/2024 1955   TCO2 25 03/21/2024 1955   ACIDBASEDEF 2.0 03/21/2024 1955   O2SAT 100 03/21/2024 1955   CBG (last 3)  Recent Labs    03/21/24 2352 03/22/24 0358 03/22/24 0726  GLUCAP 212* 178* 140*    Assessment/Plan: S/P Procedures  (LRB): THORACOSCOPY, ROBOT-ASSISTED (Bilateral) LAPAROSCOPIC ROUX-EN-Y GASTRIC (N/A) ENDOSCOPY, UPPER GI TRACT (N/A) REPAIR, HERNIA, HIATAL, ROBOT-ASSISTED USING GORE BIO-A MESH (N/A)  Chest tube- on water seal, 350 cc bloody output since surgery... will leave in place until extubated   Care per General Surgery   LOS: 1 day    Rocky Shad, PA-C 03/22/2024 7:55 AM  Agree Will remove tube once extubated  Haddon Fyfe O Hazley Dezeeuw

## 2024-03-23 ENCOUNTER — Other Ambulatory Visit (HOSPITAL_COMMUNITY): Payer: Self-pay

## 2024-03-23 ENCOUNTER — Telehealth (HOSPITAL_COMMUNITY): Payer: Self-pay

## 2024-03-23 ENCOUNTER — Inpatient Hospital Stay (HOSPITAL_COMMUNITY)

## 2024-03-23 LAB — CBC WITH DIFFERENTIAL/PLATELET
Abs Immature Granulocytes: 0.06 K/uL (ref 0.00–0.07)
Basophils Absolute: 0 K/uL (ref 0.0–0.1)
Basophils Relative: 0 %
Eosinophils Absolute: 0 K/uL (ref 0.0–0.5)
Eosinophils Relative: 0 %
HCT: 34.1 % — ABNORMAL LOW (ref 36.0–46.0)
Hemoglobin: 10.9 g/dL — ABNORMAL LOW (ref 12.0–15.0)
Immature Granulocytes: 1 %
Lymphocytes Relative: 18 %
Lymphs Abs: 2.3 K/uL (ref 0.7–4.0)
MCH: 30.2 pg (ref 26.0–34.0)
MCHC: 32 g/dL (ref 30.0–36.0)
MCV: 94.5 fL (ref 80.0–100.0)
Monocytes Absolute: 0.9 K/uL (ref 0.1–1.0)
Monocytes Relative: 7 %
Neutro Abs: 9.3 K/uL — ABNORMAL HIGH (ref 1.7–7.7)
Neutrophils Relative %: 74 %
Platelets: 174 K/uL (ref 150–400)
RBC: 3.61 MIL/uL — ABNORMAL LOW (ref 3.87–5.11)
RDW: 16.1 % — ABNORMAL HIGH (ref 11.5–15.5)
WBC: 12.5 K/uL — ABNORMAL HIGH (ref 4.0–10.5)
nRBC: 0 % (ref 0.0–0.2)

## 2024-03-23 LAB — POCT I-STAT 7, (LYTES, BLD GAS, ICA,H+H)
Acid-base deficit: 3 mmol/L — ABNORMAL HIGH (ref 0.0–2.0)
Acid-base deficit: 3 mmol/L — ABNORMAL HIGH (ref 0.0–2.0)
Bicarbonate: 25.6 mmol/L (ref 20.0–28.0)
Bicarbonate: 26.6 mmol/L (ref 20.0–28.0)
Calcium, Ion: 1.14 mmol/L — ABNORMAL LOW (ref 1.15–1.40)
Calcium, Ion: 1.12 mmol/L — ABNORMAL LOW (ref 1.15–1.40)
HCT: 40 % (ref 36.0–46.0)
HCT: 42 % (ref 36.0–46.0)
Hemoglobin: 13.6 g/dL (ref 12.0–15.0)
Hemoglobin: 14.3 g/dL (ref 12.0–15.0)
O2 Saturation: 92 %
O2 Saturation: 96 %
Potassium: 4.7 mmol/L (ref 3.5–5.1)
Potassium: 4.7 mmol/L (ref 3.5–5.1)
Sodium: 139 mmol/L (ref 135–145)
Sodium: 140 mmol/L (ref 135–145)
TCO2: 28 mmol/L (ref 22–32)
TCO2: 29 mmol/L (ref 22–32)
pCO2 arterial: 63.7 mmHg — ABNORMAL HIGH (ref 32–48)
pCO2 arterial: 65 mmHg — ABNORMAL HIGH (ref 32–48)
pH, Arterial: 7.212 — ABNORMAL LOW (ref 7.35–7.45)
pH, Arterial: 7.219 — ABNORMAL LOW (ref 7.35–7.45)
pO2, Arterial: 77 mmHg — ABNORMAL LOW (ref 83–108)
pO2, Arterial: 97 mmHg (ref 83–108)

## 2024-03-23 LAB — GLUCOSE, CAPILLARY
Glucose-Capillary: 122 mg/dL — ABNORMAL HIGH (ref 70–99)
Glucose-Capillary: 105 mg/dL — ABNORMAL HIGH (ref 70–99)

## 2024-03-23 MED ORDER — ENOXAPARIN (LOVENOX) PATIENT EDUCATION KIT
PACK | Freq: Once | Status: DC
  Filled 2024-03-23: qty 1

## 2024-03-23 MED ORDER — ACETAMINOPHEN 500 MG PO TABS: 500.0000 mg | ORAL_TABLET | Freq: Four times a day (QID) | ORAL | 0 refills | Status: AC | PRN

## 2024-03-23 MED ORDER — ONDANSETRON 4 MG PO TBDP: 4.0000 mg | ORAL_TABLET | Freq: Four times a day (QID) | ORAL | 0 refills | Status: AC | PRN

## 2024-03-23 MED ORDER — OXYCODONE HCL 5 MG PO TABS
5.0000 mg | ORAL_TABLET | Freq: Four times a day (QID) | ORAL | 0 refills | Status: DC | PRN
  Filled 2024-03-23: qty 5, 2d supply, fill #0

## 2024-03-23 MED ORDER — ENOXAPARIN SODIUM 40 MG/0.4ML IJ SOSY
40.0000 mg | PREFILLED_SYRINGE | Freq: Two times a day (BID) | INTRAMUSCULAR | 0 refills | Status: AC
  Filled 2024-03-23: qty 11.2, 14d supply, fill #0

## 2024-03-23 NOTE — Progress Notes (Signed)
 Patient provided with verbal discharge instructions. Paper copy of discharge provided to patient. RN answered all questions. VSS at discharge. IV's removed. Patient belongings sent with patient. Lovenox  education done with patient. O2 delivered & TOC meds delivered to patient room. Patient dc'd via wheelchair to discharge lounge to await ride.

## 2024-03-23 NOTE — Progress Notes (Signed)
 Discharge order written - instructions reviewed and given to patient - all questions answered, patient voiced understanding of all instructions - dietician at the bedside completing education as well. Oxygen delivered to room and patient discharged in stable condition via wheelchair with all belongings and oxygen into the care of her significant other.

## 2024-03-23 NOTE — Telephone Encounter (Signed)
 Reviewed post op bariatric instructions to include protein intake, fluid intake, diet, appointment follow up, bariatric multivitamin and calcium  schedule, BELT program, and Support Group information.  Questions answered.  Directed to call [facility] fro any additional questions or concerns.

## 2024-03-23 NOTE — TOC Transition Note (Signed)
 Transition of Care [facility]) - Discharge Note   Patient Details  Name: [name] MRN: [id number] Date of Birth: [birth year]  Transition of Care [facility]) CM/SW Contact:  Roxie KANDICE Stain, RN Phone Number: 03/23/2024, 10:57 AM   Clinical Narrative:     Patient stable for discharge.    Final next level of care: Home/Self Care Barriers to Discharge: Barriers Resolved   Patient Goals and CMS Choice Patient states their goals for this hospitalization and ongoing recovery are:: wants to remain independent CMS Medicare.gov Compare Post Acute Care list provided to:: Patient Choice offered to / list presented to : Patient      Discharge Placement                       Discharge Plan and Services Additional resources added to the After Visit Summary for     Discharge Planning Services: CM Consult            DME Arranged: Oxygen DME Agency: [employer] Date DME Agency Contacted: 03/23/24 Time DME Agency Contacted: 1025 Representative spoke with at DME Agency: [place]            Social Drivers of Health (SDOH) Interventions SDOH Screenings   Food Insecurity: No Food Insecurity (12/05/2023)   Received from [facility]  Housing: Low Risk (12/05/2023)   Received from [facility]  Transportation Needs: No Transportation Needs (12/05/2023)   Received from [facility]  Utilities: Not At Risk (12/05/2023)   Received from [facility]  Depression (PHQ2-9): Low Risk (07/02/2023)  Financial Resource Strain: Low Risk (12/05/2023)   Received from [facility]  Physical Activity: Insufficiently Active (12/05/2023)   Received from [facility]  Social Connections: Socially Integrated (12/05/2023)   Received from [facility]  Stress: Stress Concern Present (12/05/2023)   Received from [facility]  Tobacco Use: Medium Risk (03/21/2024)  Health Literacy: Adequate Health Literacy (09/10/2022)   Received from [facility]     Readmission  Risk Interventions    03/23/2024   10:55 AM  Readmission Risk Prevention Plan  Post Dischage Appt Complete  Medication Screening Complete  Transportation Screening Complete

## 2024-03-23 NOTE — Progress Notes (Cosign Needed)
°  °  Durable Medical Equipment  (From admission, onward)           Start     Ordered   03/23/24 1036  For home use only DME oxygen  Once       Question Answer Comment  Length of Need 6 Months   Mode or (Route) Mask   Liters per Minute 2   Oxygen delivery system: Gas      03/23/24 1035   03/23/24 0000  For home use only DME oxygen       Question Answer Comment  Length of Need 6 Months   Mode or (Route) Nasal cannula   Oxygen delivery system: Gas      03/23/24 1044             Blinda Ores D, RN  Registered Nurse   Progress Notes    Signed   Date of Service: 03/23/2024 10:22 AM   Signed      SATURATION QUALIFICATIONS: (This note is used to comply with regulatory documentation for home oxygen)   Patient Saturations on Room Air at Rest = 90%   Patient Saturations on Room Air while Ambulating = 87%   Patient Saturations on 2 Liters of oxygen while Ambulating = 92%

## 2024-03-23 NOTE — Progress Notes (Signed)
SATURATION QUALIFICATIONS: (This note is used to comply with regulatory documentation for home oxygen)  Patient Saturations on Room Air at Rest = 90%  Patient Saturations on Room Air while Ambulating = 87%  Patient Saturations on 2 Liters of oxygen while Ambulating = 92%

## 2024-03-23 NOTE — TOC Transition Note (Signed)
 Transition of Care [facility]) - Discharge Note   Patient Details  Name: [name] MRN: [id number] Date of Birth: 05-05-1970  [facility]) CM/SW Contact:  Roxie KANDICE Stain, RN Phone Number: 03/23/2024, 12:56 PM   Clinical Narrative:     Patient stable for discharge. Home 02 ordered, referral sent to [employer].  Follow up apt AVS.  Final next level of care: Home/Self Care Barriers to Discharge: Barriers Resolved   Patient Goals and CMS Choice Patient states their goals for this hospitalization and ongoing recovery are:: wants to remain independent CMS Medicare.gov Compare Post Acute Care list provided to:: Patient Choice offered to / list presented to : Patient      Discharge Placement             Home          Discharge Plan and Services Additional resources added to the After Visit Summary for     Discharge Planning Services: CM Consult            DME Arranged: Oxygen DME Agency: [employer] Date DME Agency Contacted: 03/23/24 Time DME Agency Contacted: 1025 Representative spoke with at DME Agency: [place]            Social Drivers of Health (SDOH) Interventions SDOH Screenings   Food Insecurity: No Food Insecurity (12/05/2023)   [facility]  Housing: Low Risk (12/05/2023)   Received from [facility]  Transportation Needs: No Transportation Needs (12/05/2023)   Received from [facility]  Utilities: Not At Risk (12/05/2023)   Received from [facility]  Depression (PHQ2-9): Low Risk (07/02/2023)  Financial Resource Strain: Low Risk (12/05/2023)   Received from [facility]  Physical Activity: Insufficiently Active (12/05/2023)   Received from [facility]  Social Connections: Socially Integrated (12/05/2023)   Received from [facility]  Stress: Stress Concern Present (12/05/2023)   Received from [facility]  Tobacco Use: Medium Risk (03/21/2024)  Health Literacy: Adequate Health Literacy (09/10/2022)    Received from [facility]     Readmission Risk Interventions    03/23/2024   10:55 AM  Readmission Risk Prevention Plan  Post Dischage Appt Complete  Medication Screening Complete  Transportation Screening Complete

## 2024-03-23 NOTE — Progress Notes (Signed)
 Bariatric discharge specific education provided to patient at discharge.  Will follow up for specific questions or concerns.

## 2024-03-23 NOTE — Progress Notes (Signed)
 Nutrition Education Note  RD consulted for nutrition education regarding bariatric surgery (Roux-En-Y).  Body mass index is 50 kg/m. Pt meets criteria for morbid obesity based on current BMI.  RD provided Roux-En-Y Gastric Bypass/Sleeve Gastrectomy Discharge Nutrition Therapy handout from the Academy of Nutrition [employer] Dietetics.   Emphasized the importance of fluid intake, meal frequency, meal content and texture. Discussed importance of controlled and consistent intake throughout the day. Emphasized the importance of hydration with calorie-free beverages and limiting sugar-sweetened beverages. Encouraged pt to discuss physical activity options with physician. Teach back method used. She is already with scheduled follow up with surgery and RD.  Expect adequate compliance.  Current diet order is full liquid, patient is consuming approximately 50-100% of meals at this time. Labs and medications reviewed. No further nutrition interventions warranted at this time. RD contact information provided. If additional nutrition issues arise, please re-consult RD.  Blair Deaner MS, RD, LDN Registered Dietitian Clinical Nutrition RD Inpatient Contact Info in Amion

## 2024-03-23 NOTE — Progress Notes (Addendum)
° °   °  [address] [place] [employer] [address]             [phone]         2 Days Post-Op Procedures (LRB): THORACOSCOPY, ROBOT-ASSISTED (Bilateral) LAPAROSCOPIC ROUX-EN-Y GASTRIC (N/A) ENDOSCOPY, UPPER GI TRACT (N/A) REPAIR, HERNIA, HIATAL, ROBOT-ASSISTED USING GORE BIO-A MESH (N/A)  Subjective:  Getting ready for breathing treatment.  No complaints. Overall doing fairly well  Objective: Vital signs in last 24 hours: Temp:  [97.8 F (36.6 C)-98.2 F (36.8 C)] 98 F (36.7 C) (12/17 0245) Pulse Rate:  [72-100] 72 (12/17 0245) Cardiac Rhythm: Normal sinus rhythm;Heart block (12/17 0700) Resp:  [13-22] 15 (12/17 0245) BP: (99-144)/(59-87) 112/64 (12/17 0245) SpO2:  [91 %-96 %] 93 % (12/17 0245) Arterial Line BP: (93)/(89) 93/89 (12/16 0900) FiO2 (%):  [36 %-50 %] 36 % (12/16 1943)  Intake/Output from previous day: 12/16 0701 - 12/17 0700 In: 1155.7 [P.O.:690; I.V.:465.7] Out: 230 [Urine:200; Chest Tube:30]  General appearance: alert, cooperative, and no distress Heart: regular rate and rhythm Lungs: diminished breath sounds bilaterally Wound: clean and dry  Lab Results: Recent Labs    03/22/24 0505 03/23/24 0458  WBC 11.1* 12.5*  HGB 11.6* 10.9*  HCT 36.7 34.1*  PLT 195 174   BMET:  Recent Labs    03/21/24 1929 03/21/24 1955 03/22/24 0505  NA 138 140 140  K 4.6 4.6 4.1  CL 104  --  109  CO2 22  --  28  GLUCOSE 201*  --  161*  BUN 15  --  11  CREATININE 0.88  --  0.74  CALCIUM  8.2*  --  8.0*    PT/INR:  Recent Labs    03/21/24 1929  LABPROT 14.9  INR 1.1   ABG    Component Value Date/Time   PHART 7.344 (L) 03/21/2024 1955   HCO3 23.7 03/21/2024 1955   TCO2 25 03/21/2024 1955   ACIDBASEDEF 2.0 03/21/2024 1955   O2SAT 100 03/21/2024 1955   CBG (last 3)  Recent Labs    03/22/24 2029 03/22/24 2307 03/23/24 0446  GLUCAP 109* 117* 122*    Assessment/Plan: S/P Procedures (LRB): THORACOSCOPY, ROBOT-ASSISTED  (Bilateral) LAPAROSCOPIC ROUX-EN-Y GASTRIC (N/A) ENDOSCOPY, UPPER GI TRACT (N/A) REPAIR, HERNIA, HIATAL, ROBOT-ASSISTED USING GORE BIO-A MESH (N/A)  Extubated yesterday.SABRA CXR was removed yesterday after extubation.. CXR w/o pneumothorax, some atelectasis bilaterally  Plan per Gen Surgery... please contact us  if we can be of any assistance  LOS: 2 days    Rocky Shad, PA-C 03/23/2024 8:05 AM  CXR stable Dispo planning  Tanvi Gatling O Latrise Bowland

## 2024-03-23 NOTE — Evaluation (Signed)
 Physical Therapy Evaluation & Discharge Patient Details Name: [name] MRN: [id number] DOB: 01/14/1971 Today's Date: 03/23/2024  History of Present Illness  53 yo F adm 03/21/24 for robotic paraesophageal hernia repair with gastric BPG, chest tube, reintubated in PACU. 12/16 extubated. PMhx; severe morbid obesity, HTN, GERD, anxiety/depression, psoriatic arthritis, cervical spondylosis s/p arthropathy of cervical facet joint, PTSD, CVA, polyneuropathy   Clinical Impression  Pt presents with condition above. Pt appears and reports to be functioning at her baseline, mobilizing independently without LOB or need for an AD. She denies any pain at this time. She does report some mild residual R-sided weakness from a prior CVA but she moves her R extremities functionally. She did require 2L of supplemental O2 to maintain her sats >/= 90% on RA when ambulating today though, whereas she does not use supplemental O2 during the day. Her sats dropped to 87% on RA when ambulating. All education completed and questions answered. PT will sign off and defer further mobility and monitoring her sats to nursing and mobility specialists.        If plan is discharge home, recommend the following:  (N/A)   Can travel by private vehicle        Equipment Recommendations None recommended by PT  Recommendations for Other Services       Functional Status Assessment Patient has not had a recent decline in their functional status     Precautions / Restrictions Precautions Precautions: Other (comment) Precaution/Restrictions Comments: watch SpO2 Restrictions Weight Bearing Restrictions Per Provider Order: No      Mobility  Bed Mobility Overal bed mobility: Modified Independent             General bed mobility comments: HOB elevated, no assistance needed to exit R EOB    Transfers Overall transfer level: Independent Equipment used: None               General transfer comment: No LOB  standing from EOB    Ambulation/Gait Ambulation/Gait assistance: Modified independent (Device/Increase time) Gait Distance (Feet): 380 Feet Assistive device: None Gait Pattern/deviations: Step-through pattern, Decreased stride length Gait velocity: WFL Gait velocity interpretation: >2.62 ft/sec, indicative of community ambulatory   General Gait Details: Pt initially ambulating at a slow pace with decreased stride length, but pt able to increase speed to her reported normal speed as distance progressed. No overt LOB  Stairs            Wheelchair Mobility     Tilt Bed    Modified Rankin (Stroke Patients Only)       Balance Overall balance assessment: No apparent balance deficits (not formally assessed)                                           Pertinent Vitals/Pain Pain Assessment Pain Assessment: No/denies pain    Home Living Family/patient expects to be discharged to:: Private residence Living Arrangements: Children;Parent (mother and son) Available Help at Discharge: Family;Available 24 hours/day Type of Home: Mobile home Home Access: Stairs to enter Entrance Stairs-Rails: Can reach both Entrance Stairs-Number of Steps: 5   Home Layout: One level Home Equipment: Shower seat - built in;Grab bars - tub/shower      Prior Function Prior Level of Function : Independent/Modified Independent;Driving             Mobility Comments: No AD ADLs Comments: On disability  so does not work     Radio Producer Extremity Assessment Upper Extremity Assessment: Overall WFL for tasks assessed;RUE deficits/detail RUE Deficits / Details: hx of CVA impacting strength on R side, but functional    Lower Extremity Assessment Lower Extremity Assessment: Overall WFL for tasks assessed;RLE deficits/detail RLE Deficits / Details: hx of CVA impacting strength on R side, but functional    Cervical / Trunk Assessment Cervical / Trunk  Assessment: Normal  Communication   Communication Communication: No apparent difficulties    Cognition Arousal: Alert Behavior During Therapy: WFL for tasks assessed/performed   PT - Cognitive impairments: No apparent impairments                         Following commands: Intact       Cueing Cueing Techniques: Verbal cues     General Comments General comments (skin integrity, edema, etc.): SpO2 dropping to 87% (when pleth was reliable) when ambulating on RA, needed 2L O2 to maintain sats >/= 90% when ambulating, on 2L at rest    Exercises     Assessment/Plan    PT Assessment Patient does not need any further PT services  PT Problem List         PT Treatment Interventions      PT Goals (Current goals can be found in the Care Plan section)  Acute Rehab PT Goals Patient Stated Goal: to go home PT Goal Formulation: All assessment and education complete, DC therapy Time For Goal Achievement: 03/24/24 Potential to Achieve Goals: Good    Frequency       Co-evaluation               AM-PAC PT 6 Clicks Mobility  Outcome Measure Help needed turning from your back to your side while in a flat bed without using bedrails?: None Help needed moving from lying on your back to sitting on the side of a flat bed without using bedrails?: None Help needed moving to and from a bed to a chair (including a wheelchair)?: None Help needed standing up from a chair using your arms (e.g., wheelchair or bedside chair)?: None Help needed to walk in hospital room?: None Help needed climbing 3-5 steps with a railing? : None 6 Click Score: 24    End of Session Equipment Utilized During Treatment: Oxygen Activity Tolerance: Patient tolerated treatment well Patient left: in chair;with call bell/phone within reach (RN reported no need for chair alarm) Nurse Communication: Mobility status PT Visit Diagnosis: Other abnormalities of gait and mobility (R26.89)    Time:  9151-9094 PT Time Calculation (min) (ACUTE ONLY): 17 min   Charges:   PT Evaluation $PT Eval Low Complexity: 1 Low   PT General Charges $$ ACUTE PT VISIT: 1 Visit         Theo Ferretti, PT, DPT Acute Rehabilitation Services  Office: [phone]   Theo CHRISTELLA Ferretti 03/23/2024, 9:15 AM

## 2024-03-24 LAB — POCT I-STAT 7, (LYTES, BLD GAS, ICA,H+H)
Acid-Base Excess: 1 mmol/L (ref 0.0–2.0)
Bicarbonate: 28.7 mmol/L — ABNORMAL HIGH (ref 20.0–28.0)
Calcium, Ion: 1.16 mmol/L (ref 1.15–1.40)
HCT: 37 % (ref 36.0–46.0)
Hemoglobin: 12.6 g/dL (ref 12.0–15.0)
O2 Saturation: 90 %
Potassium: 4.4 mmol/L (ref 3.5–5.1)
Sodium: 140 mmol/L (ref 135–145)
TCO2: 31 mmol/L (ref 22–32)
pCO2 arterial: 58.5 mmHg — ABNORMAL HIGH (ref 32–48)
pH, Arterial: 7.299 — ABNORMAL LOW (ref 7.35–7.45)
pO2, Arterial: 66 mmHg — ABNORMAL LOW (ref 83–108)

## 2024-03-29 ENCOUNTER — Ambulatory Visit

## 2024-03-29 ENCOUNTER — Telehealth (HOSPITAL_COMMUNITY): Payer: Self-pay

## 2024-03-29 VITALS — BP 150/80 | HR 90 | Resp 20 | Ht 71.0 in | Wt 344.0 lb

## 2024-03-29 DIAGNOSIS — K449 Diaphragmatic hernia without obstruction or gangrene: Secondary | ICD-10-CM

## 2024-03-29 DIAGNOSIS — Z09 Encounter for follow-up examination after completed treatment for conditions other than malignant neoplasm: Secondary | ICD-10-CM

## 2024-03-29 NOTE — Progress Notes (Signed)
 "     [address] [name] [address]             [phone]       HPI:  Patient returns for routine postoperative follow-up having undergone a co-surgery with Robotic assisted left video thoracoscopy for paraesophageal hernia repair with Dr. Shyrl and robotic Roux-en-Y gastric bypass, insertion of mesh, and upper endoscopy with Dr. Stevie on 03/21/2024.   Since hospital discharge the patient reports that she is doing well.  Her pain is well managed.  She does have some left should pain due to gas.  She has been using a heating pad which is helping.  She has been following her dietary guidelines as instructed.  Her incision sites have been healing well.  She denies chest pain, shortness of breath.   Allergies as of 03/29/2024       Reactions   Haemophilus B Polysaccharide Vaccine Swelling, Other (See Comments)   Redness at injection site (approximatly golf ball size) hot to the touch   Lisinopril  Hives   Lisinopril -hydrochlorothiazide  Hives, Other (See Comments)   Welts, too   Wound Dressings Hives   Alpha-gal Diarrhea, Nausea And Vomiting, Other (See Comments)   Blisters inside of mouth, too Has tolerated lovenox    Bovine (beef) Protein-containing Drug Products Diarrhea, Nausea And Vomiting, Other (See Comments)   Alpha-gal   Influenza Vaccines    States her arm had a knot and turned red   Adhesive [tape] Rash, Other (See Comments)   Also, blisters   Wound Dressing Adhesive Rash, Other (See Comments)   Blisters, too        Medication List        Accurate as of March 29, 2024 10:37 AM. If you have any questions, ask your nurse or doctor.          PAUSE taking these medications    Mounjaro  10 MG/0.5ML Pen Wait to take this until: April 23, 2024 Generic drug: tirzepatide  Inject 10 mg into the skin every 7 (seven) days.       TAKE these medications    acetaminophen  500 MG tablet Commonly known as: TYLENOL  Take 1 tablet  (500 mg total) by mouth every 6 (six) hours as needed for mild pain (pain score 1-3), moderate pain (pain score 4-6), headache or fever.   albuterol  108 (90 Base) MCG/ACT inhaler Commonly known as: VENTOLIN  HFA Inhale 1-2 puffs into the lungs every 6 (six) hours as needed for wheezing or shortness of breath. What changed: when to take this   ALPRAZolam  1 MG tablet Commonly known as: XANAX  Take 1 mg by mouth in the morning and at bedtime.   atorvastatin  40 MG tablet Commonly known as: LIPITOR Take 1 tablet (40 mg total) by mouth daily.   azelastine  0.1 % nasal spray Commonly known as: ASTELIN  Place 2 sprays into both nostrils 2 (two) times daily as needed for rhinitis or allergies.   baclofen  10 MG tablet Commonly known as: LIORESAL  Take 1 tablet (10 mg total) by mouth 2 (two) times daily as needed (migraine).   Black Cohosh 540 MG Caps Take 540 mg by mouth at bedtime.   chlorhexidine  0.12 % solution Commonly known as: PERIDEX  Use as directed 15 mLs in the mouth or throat as needed for wound care for up to 14 days.   clopidogrel  75 MG tablet Commonly known as: PLAVIX  Take 75 mg by mouth daily.   diltiazem  120 MG 24 hr  capsule Commonly known as: CARDIZEM  CD Take 120 mg by mouth daily.   enoxaparin  40 MG/0.4ML injection Commonly known as: LOVENOX  Inject 0.4 mLs (40 mg total) into the skin every 12 (twelve) hours.   fluticasone  50 MCG/ACT nasal spray Commonly known as: FLONASE Place 2 sprays into both nostrils daily as needed for allergies or rhinitis.   fluticasone -salmeterol 115-21 MCG/ACT inhaler Commonly known as: Advair HFA Inhale 2 puffs into the lungs 2 (two) times daily.   gabapentin  300 MG capsule Commonly known as: NEURONTIN  Take 300 mg by mouth 3 (three) times daily.   levocetirizine 5 MG tablet Commonly known as: XYZAL  Take 5 mg by mouth every evening.   magnesium  oxide 400 (240 Mg) MG tablet Commonly known as: MAG-OX Take 400 mg by mouth daily.    Multi-Vitamin tablet Take 1 tablet by mouth daily with breakfast.   nebivolol  5 MG tablet Commonly known as: BYSTOLIC  Take 5 mg by mouth at bedtime.   ondansetron  4 MG disintegrating tablet Commonly known as: ZOFRAN -ODT Take 1 tablet (4 mg total) by mouth every 6 (six) hours as needed for nausea or vomiting.   oxyCODONE  5 MG immediate release tablet Commonly known as: Oxy IR/ROXICODONE  Take 1 tablet (5 mg total) by mouth every 6 (six) hours as needed for breakthrough pain or severe pain (pain score 7-10).   pantoprazole  40 MG tablet Commonly known as: PROTONIX  Take 40 mg by mouth in the morning and at bedtime.   promethazine  25 MG tablet Commonly known as: PHENERGAN  Take 25 mg by mouth daily as needed for nausea or vomiting.   QUEtiapine  300 MG tablet Commonly known as: SEROQUEL  Take 300 mg by mouth at bedtime.   Stelara 90 MG/ML Sosy injection Generic drug: ustekinumab Inject 90 mg into the skin every 3 (three) months.   SUMAtriptan  20 MG/ACT nasal spray Commonly known as: IMITREX  Place 1 spray (20 mg total) into the nose as needed for migraine or headache. May repeat in 2 hours if headache persists or recurs.   venlafaxine  XR 75 MG 24 hr capsule Commonly known as: EFFEXOR -XR Take 75 mg by mouth daily with breakfast.   venlafaxine  XR 150 MG 24 hr capsule Commonly known as: EFFEXOR -XR Take 150 mg by mouth daily with breakfast.         ROS Review of Systems  Respiratory:  Negative for cough and shortness of breath.   Cardiovascular:  Negative for chest pain, palpitations and leg swelling.  Gastrointestinal:  Negative for abdominal pain.      BP (!) 150/80   Pulse 90   Resp 20   Ht 5' 11 (1.803 m)   Wt (!) 344 lb (156 kg)   SpO2 95% Comment: RA  BMI 47.98 kg/m    Physical Exam Constitutional:      Appearance: Normal appearance.  HENT:     Head: Normocephalic and atraumatic.  Cardiovascular:     Rate and Rhythm: Normal rate and regular rhythm.      Heart sounds: Normal heart sounds, S1 normal and S2 normal.  Pulmonary:     Effort: Pulmonary effort is normal.     Breath sounds: Normal breath sounds.  Musculoskeletal:     Cervical back: Normal range of motion.  Skin:    General: Skin is warm and dry.      Neurological:     General: No focal deficit present.     Mental Status: She is alert.      Imaging: N/A   Assessment/Plan:  Surgery follow-up examination/ s/p Robotic assisted left video thoracoscopy and Hiatal hernia -Overall doing well postoperatively  -She is to continue to follow dietary changes per general surgery -She is to keep incision sites clean with soap and water and pat dry -She should be active with interval walking. Recommend 3 walks per day and increase the time of walks as tolerated -Continue with follow up as scheduled with general surgery -Follow up with [facility] in one month    Manuelita CHRISTELLA Rough, [place] 10:37 AM 03/29/2024  "

## 2024-03-29 NOTE — Patient Instructions (Signed)
 Follow up in one month.

## 2024-03-29 NOTE — Telephone Encounter (Signed)
 1. Tell me about your pain and pain management?    Pt denies any pain.    2. Let's talk about fluid intake. How much total fluid are you taking in?   Pt states that s/he is getting in at least 64 oz of fluid including protein shakes, bottled water, and broth   3. How much protein have you taken in the last day?    Pt states she is meeting the goal of 60g of protein each day with the protein shakes    4. Have you had nausea? Tell me about when you have experienced nausea and what you did to help?   Pt denies nausea.   5. Has the frequency or color changed with your urine?   Pt states that s/he is urinating fine with no changes in frequency or urgency.   6. Tell me what your incisions look like?   Incisions look fine. Pt denies a fever, chills. Pt states incisions are not swollen, open, or draining. Pt encouraged to call [facility] if incisions change.   7. Have you been passing gas? BM?   Pt states that they are having BMs. Last BM 03/29/2024   8. If a problem or question were to arise who would you call? Do you know contact numbers for [facility], [facility], and [facility]?   Pt knows to call [facility] for surgical, [facility] for nutrition, and [facility] for non-urgent questions or concerns. Pt denies dehydration symptoms. Pt can describe s/sx of dehydration.   9. How has the walking going?   Pt states s/he is walking around and able to be active without difficulty.   10. Are you still using your incentive spirometer? If so, how often?   Pt states that s/he is doing the I.S. Pt encouraged to use incentive spirometer, at least 10x every hour while awake until s/he sees the surgeon.   11. How are your vitamins and calcium  going? How are you taking them?     Pt states that s/he is taking his/her supplements and vitamins without difficulty.   12. How has the anticoagulant Lovenox  been going?   Reaction to Lovenox  patient called [facility] stopped medication and resumed plavix  per patient  Reminded patient that the  first 30 days post-operatively are important for successful recovery. Practice good hand hygiene, and minimizing exposure to people who live outside of the home, especially if they are exhibiting any respiratory, GI, or illness-like symptoms.

## 2024-03-30 ENCOUNTER — Other Ambulatory Visit: Payer: Self-pay

## 2024-04-01 LAB — BPAM RBC
Blood Product Expiration Date: 202601112359
Blood Product Expiration Date: 202601112359
ISSUE DATE / TIME: 202512150903
ISSUE DATE / TIME: 202512150903
Unit Type and Rh: 5100
Unit Type and Rh: 5100

## 2024-04-01 LAB — TYPE AND SCREEN
ABO/RH(D): O POS
Antibody Screen: NEGATIVE
Unit division: 0
Unit division: 0

## 2024-04-05 ENCOUNTER — Encounter: Payer: Self-pay | Admitting: Dietician

## 2024-04-05 ENCOUNTER — Encounter: Attending: General Surgery | Admitting: Dietician

## 2024-04-05 VITALS — Ht 71.0 in | Wt 331.7 lb

## 2024-04-05 DIAGNOSIS — Z713 Dietary counseling and surveillance: Secondary | ICD-10-CM | POA: Insufficient documentation

## 2024-04-05 DIAGNOSIS — E669 Obesity, unspecified: Secondary | ICD-10-CM | POA: Diagnosis present

## 2024-04-05 DIAGNOSIS — Z6841 Body Mass Index (BMI) 40.0 and over, adult: Secondary | ICD-10-CM | POA: Insufficient documentation

## 2024-04-05 NOTE — Progress Notes (Signed)
 2 Week Post-Operative Nutrition Class   Class start Time: 1520   Class End Time: 1622  This was a class of 8 patients.   Patient was seen on 04/05/2024 for Post-Operative Nutrition education at [facility]    Surgery date: 03/21/2024 Surgery type: Gastric By-Pass   Anthropometrics  Start weight at [facility]: 347.6 lbs (date: 07/02/2023)  Height: 71 in Weight today: 331.7 lbs BMI: 47.95 kg/m2     Clinical   Pharmacotherapy: History of weight loss medication used: phentermine, Mounjaro   Medical hx: obesity, stroke, asthma, HTN, T2DM, GERD, hiatal hernia, gastric band placement and removal, barrett's esophagus, alpha-gal syndrome Medications: see list (also Mounjaro  7.5 mg) Labs: 03/05/2023: glucose 100, potassium 3.4, calcium  8.2 Notable signs/symptoms: none noted Any previous deficiencies? No Bowel Habits: Every day to every other day no complaints   Body Composition Scale 04/05/2024  Current Body Weight 331.7  Total Body Fat % 48.6  Visceral Fat 20  Fat-Free Mass % 51.3   Total Body Water % 40.1  Muscle-Mass lbs 36.8  BMI 46.1  Body Fat Displacement          Torso  lbs 100.2         Left Leg  lbs 20.0         Right Leg  lbs 20.0         Left Arm  lbs 10.0         Right Arm  lbs 10.0    The following the learning objectives were met by the patient during this course: Identifies Soft Prepped Plan Advancement Guide  Identifies Soft, High Proteins (Phase 1), beginning 2 weeks post-operatively to 3 weeks post-operatively Identifies Additional Soft High Proteins, soft non-starchy vegetables, fruits and starches (Phase 2), beginning 3 weeks post-operatively to 3 months post-operatively Identifies appropriate sources of fluids, proteins, vegetables, fruits and starches Identifies appropriate fat sources and healthy verses unhealthy fat types   States protein, vegetable, fruit and starch recommendations and appropriate sources  post-operatively Identifies the need for appropriate texture modifications, mastication, and bite sizes when consuming solids Identifies appropriate fat consumption and sources Identifies appropriate multivitamin and calcium  sources post-operatively Describes the need for physical activity post-operatively and will follow MD recommendations States when to call healthcare provider regarding medication questions or post-operative complications   Handouts given during class include: Soft Prepped Plan Advancement Guide   Follow-Up Plan: Patient will follow-up at [facility] in 10 weeks for 3 month post-op nutrition visit for diet advancement per MD.

## 2024-04-05 NOTE — Discharge Summary (Signed)
 " Physician Discharge Summary  [name] FMW:[id number] DOB: 09-11-1970 DOA: 03/21/2024  PCP: Nena Rosina CROME, PA-C  Admit date: 03/21/2024 Discharge date: 03/23/2024   Recommendations for Outpatient Follow-up:   (include homehealth, outpatient follow-up instructions, specific recommendations for PCP to follow-up on, etc.)   Follow-up Information     Yanci Bachtell, Herlene Righter, MD. Go on 04/13/2024.   Specialty: General Surgery Why: @ 910 am.  Please arrive 15 minutes prior to appointment.  Thank you Contact information: [address] [place] [address] [phone]         Stein Windhorst, Herlene Righter, MD. Go on 05/16/2024.   Specialty: General Surgery Why: @ 130 pm.  Please arrive 15 minutes prior to appointment time.  Thank you Contact information: [address] [place] [address] [phone]         [facility] (DME) Follow up.   Specialty: DME Services Why: home oxygen Contact information: [address] [employer] [place]  [address] [phone]        Nena Rosina CROME, PA-C Follow up in 1 week(s).   Specialty: Physician Assistant Why: Hospital follow up Contact information: [address] [place] [address] [phone]                Discharge Diagnoses:  Principal Problem:   Status post robot-assisted surgical procedure Active Problems:   Hiatal hernia   Surgical Procedure: Roux-en-Y gastric bypass, upper endoscopy  Discharge Condition: Good Disposition: Home  Diet recommendation: Postoperative sleeve gastrectomy diet (liquids only)  Filed Weights   03/21/24 0552 03/22/24 0615  Weight: (!) 158.8 kg (!) 162.6 kg     Hospital Course:  The patient was admitted after undergoing Roux-en-Y gastric bypass. POD 0 she ambulated well. POD 1 she was started on the water diet protocol and tolerated 300 ml in the first shift. Once meeting the water amount she was advanced to  bariatric protein shakes which they tolerated and were discharged home POD 2.  Treatments: surgery: Roux-en-Y gastric bypass  Discharge Instructions  Discharge Instructions     Amb Referral to Nutrition and Diabetic Education   Complete by: As directed    Ambulate hourly while awake   Complete by: As directed    Call MD for:  difficulty breathing, headache or visual disturbances   Complete by: As directed    Call MD for:  persistant dizziness or light-headedness   Complete by: As directed    Call MD for:  persistant nausea and vomiting   Complete by: As directed    Call MD for:  redness, tenderness, or signs of infection (pain, swelling, redness, odor or green/yellow discharge around incision site)   Complete by: As directed    Call MD for:  severe uncontrolled pain   Complete by: As directed    Call MD for:  temperature >101 F   Complete by: As directed    Diet bariatric full liquid   Complete by: As directed    Discharge wound care:   Complete by: As directed    Remove Bandaids tomorrow, ok to shower tomorrow. Steristrips may fall off in 1-3 weeks.   For home use only DME oxygen   Complete by: As directed    Length of Need: 6 Months   Mode or (Route): Nasal cannula   Oxygen delivery system: Gas   Incentive spirometry   Complete by: As directed    Perform hourly while awake  Allergies as of 03/23/2024       Reactions   Haemophilus B Polysaccharide Vaccine Swelling, Other (See Comments)   Redness at injection site (approximatly golf ball size) hot to the touch   Lisinopril  Hives   Lisinopril -hydrochlorothiazide  Hives, Other (See Comments)   Welts, too   Wound Dressings Hives   Alpha-gal Diarrhea, Nausea And Vomiting, Other (See Comments)   Blisters inside of mouth, too Has tolerated lovenox    Bovine (beef) Protein-containing Drug Products Diarrhea, Nausea And Vomiting, Other (See Comments)   Alpha-gal   Influenza Vaccines    States her arm had a knot and  turned red   Adhesive [tape] Rash, Other (See Comments)   Also, blisters   Wound Dressing Adhesive Rash, Other (See Comments)   Blisters, too        Medication List     PAUSE taking these medications    Mounjaro  10 MG/0.5ML Pen Wait to take this until: April 23, 2024 Generic drug: tirzepatide  Inject 10 mg into the skin every 7 (seven) days.       STOP taking these medications    celecoxib  200 MG capsule Commonly known as: CELEBREX    famotidine  40 MG tablet Commonly known as: PEPCID    topiramate  50 MG tablet Commonly known as: TOPAMAX        TAKE these medications    acetaminophen  500 MG tablet Commonly known as: TYLENOL  Take 1 tablet (500 mg total) by mouth every 6 (six) hours as needed for mild pain (pain score 1-3), moderate pain (pain score 4-6), headache or fever. What changed: how much to take   albuterol  108 (90 Base) MCG/ACT inhaler Commonly known as: VENTOLIN  HFA Inhale 1-2 puffs into the lungs every 6 (six) hours as needed for wheezing or shortness of breath. What changed: when to take this   ALPRAZolam  1 MG tablet Commonly known as: XANAX  Take 1 mg by mouth in the morning and at bedtime.   atorvastatin  40 MG tablet Commonly known as: LIPITOR Take 1 tablet (40 mg total) by mouth daily.   azelastine  0.1 % nasal spray Commonly known as: ASTELIN  Place 2 sprays into both nostrils 2 (two) times daily as needed for rhinitis or allergies.   baclofen  10 MG tablet Commonly known as: LIORESAL  Take 1 tablet (10 mg total) by mouth 2 (two) times daily as needed (migraine).   Black Cohosh 540 MG Caps Take 540 mg by mouth at bedtime.   clopidogrel  75 MG tablet Commonly known as: PLAVIX  Take 75 mg by mouth daily.   diltiazem  120 MG 24 hr capsule Commonly known as: CARDIZEM  CD Take 120 mg by mouth daily.   enoxaparin  40 MG/0.4ML injection Commonly known as: LOVENOX  Inject 0.4 mLs (40 mg total) into the skin every 12 (twelve) hours.    fluticasone  50 MCG/ACT nasal spray Commonly known as: FLONASE Place 2 sprays into both nostrils daily as needed for allergies or rhinitis.   fluticasone -salmeterol 115-21 MCG/ACT inhaler Commonly known as: Advair HFA Inhale 2 puffs into the lungs 2 (two) times daily.   gabapentin  300 MG capsule Commonly known as: NEURONTIN  Take 300 mg by mouth 3 (three) times daily.   levocetirizine 5 MG tablet Commonly known as: XYZAL  Take 5 mg by mouth every evening.   magnesium  oxide 400 (240 Mg) MG tablet Commonly known as: MAG-OX Take 400 mg by mouth daily.   Multi-Vitamin tablet Take 1 tablet by mouth daily with breakfast.   nebivolol  5 MG tablet Commonly known as: BYSTOLIC  Take 5  mg by mouth at bedtime.   ondansetron  4 MG disintegrating tablet Commonly known as: ZOFRAN -ODT Take 1 tablet (4 mg total) by mouth every 6 (six) hours as needed for nausea or vomiting.   oxyCODONE  5 MG immediate release tablet Commonly known as: Oxy IR/ROXICODONE  Take 1 tablet (5 mg total) by mouth every 6 (six) hours as needed for breakthrough pain or severe pain (pain score 7-10).   pantoprazole  40 MG tablet Commonly known as: PROTONIX  Take 40 mg by mouth in the morning and at bedtime.   promethazine  25 MG tablet Commonly known as: PHENERGAN  Take 25 mg by mouth daily as needed for nausea or vomiting.   QUEtiapine  300 MG tablet Commonly known as: SEROQUEL  Take 300 mg by mouth at bedtime.   Stelara 90 MG/ML Sosy injection Generic drug: ustekinumab Inject 90 mg into the skin every 3 (three) months.   SUMAtriptan  20 MG/ACT nasal spray Commonly known as: IMITREX  Place 1 spray (20 mg total) into the nose as needed for migraine or headache. May repeat in 2 hours if headache persists or recurs.   venlafaxine  XR 75 MG 24 hr capsule Commonly known as: EFFEXOR -XR Take 75 mg by mouth daily with breakfast.   venlafaxine  XR 150 MG 24 hr capsule Commonly known as: EFFEXOR -XR Take 150 mg by mouth daily  with breakfast.               Durable Medical Equipment  (From admission, onward)           Start     Ordered   03/23/24 0000  For home use only DME oxygen       Question Answer Comment  Length of Need 6 Months   Mode or (Route) Nasal cannula   Oxygen delivery system: Gas      03/23/24 1044              Discharge Care Instructions  (From admission, onward)           Start     Ordered   03/23/24 0000  Discharge wound care:       Comments: Remove Bandaids tomorrow, ok to shower tomorrow. Steristrips may fall off in 1-3 weeks.   03/23/24 0930            Follow-up Information     Arch Methot, Herlene Righter, MD. Go on 04/13/2024.   Specialty: General Surgery Why: @ 910 am.  Please arrive 15 minutes prior to appointment.  Thank you Contact information: [address] [place] [address] [phone]         Antino Mayabb, Herlene Righter, MD. Go on 05/16/2024.   Specialty: General Surgery Why: @ 130 pm.  Please arrive 15 minutes prior to appointment time.  Thank you Contact information: [address] [place] [address] [phone]         [facility] (DME) Follow up.   Specialty: DME Services Why: home oxygen Contact information: [address] [place]  [address] [phone]        Nena Rosina CROME, PA-C Follow up in 1 week(s).   Specialty: Physician Assistant Why: Hospital follow up Contact information: [address] [place] [address] [phone]                  The results of significant diagnostics from this hospitalization (including imaging, microbiology, ancillary and laboratory) are listed below for reference.    Significant Diagnostic Studies: DG Chest Port 1  View Result Date: 03/23/2024 CLINICAL DATA:  Pneumothorax. EXAM: PORTABLE CHEST 1 VIEW COMPARISON:  03/22/2024 FINDINGS: Endotracheal tube and left chest tube have been removed.  Negative for a pneumothorax. Right jugular central line has been retracted and the tip is likely in the right innominate vein region. Patchy linear densities in the right perihilar region are suggestive for atelectasis. Hazy densities at the left lung base could also represent atelectasis. IMPRESSION: 1. Negative for a pneumothorax following chest tube removal. 2. Right jugular central line has been retracted. The tip is likely in the right innominate vein region. 3. Patchy densities in the right perihilar region and left lung base are suggestive for atelectasis. Electronically Signed   By: Juliene Balder M.D.   On: 03/23/2024 15:38   DG Chest Port 1 View Result Date: 03/22/2024 CLINICAL DATA:  Post robot assisted surgical procedure. EXAM: PORTABLE CHEST 1 VIEW COMPARISON:  03/21/2024 FINDINGS: Right IJ central venous catheter unchanged. Endotracheal tube has tip 4.2 cm above the carina. Left-sided chest tube unchanged. Lungs are somewhat hypoinflated with minimal prominence of the perihilar markings unchanged. No lobar consolidation, effusion or pneumothorax. Cardiomediastinal silhouette and remainder of the exam is unchanged. IMPRESSION: 1. Hypoinflation with minimal prominence of the perihilar markings unchanged. 2. Tubes and lines as described. Electronically Signed   By: Toribio Agreste M.D.   On: 03/22/2024 08:00   DG Chest Port 1 View Result Date: 03/21/2024 EXAM: 1 VIEW(S) XRAY OF THE CHEST 03/21/2024 04:22:00 PM COMPARISON: 03/17/2024 CLINICAL HISTORY: Postop day 0 status post gastric bypass and hiatal hernia repair. FINDINGS: LINES, TUBES AND DEVICES: Right internal jugular central venous catheter in place with tip overlying the expected region of the superior cavoatrial junction. Left lateral chest tube in place. LUNGS AND PLEURA: Low lung volumes. Right mid lung atelectasis. Left hilar/paramediastinal density is new and probably from atelectasis in the postoperative setting. Mediastinal hematoma is a  less likely differential diagnostic consideration, but should the patient experience atypical postoperative course, chest CT should be considered. Trace left pleural effusion. Suspected small left apical pneumothorax. HEART AND MEDIASTINUM: No acute abnormality of the cardiac and mediastinal silhouettes. BONES AND SOFT TISSUES: No acute osseous abnormality. IMPRESSION: 1. New left hilar/paramediastinal density, most consistent with postoperative atelectasis; mediastinal hematoma is less likely, and if the postoperative course is atypical, chest CT can be considered. 2. Suspected small left apical pneumothorax a left chest tube is in place . 3. Low lung volumes with right mid lung atelectasis. 4. Trace left pleural effusion. Electronically signed by: Ryan Salvage MD 03/21/2024 06:55 PM EST RP Workstation: [id number]   DG Chest Port 1 View Result Date: 03/21/2024 EXAM: 1 VIEW(S) XRAY OF THE CHEST 03/21/2024 06:14:00 PM COMPARISON: 03/17/2024 and 03/21/2024 04:06 PM. CLINICAL HISTORY: Postop day 0 status post paraesophageal hernia repair with robotic roux en y gastric bypass. FINDINGS: LINES, TUBES AND DEVICES: Endotracheal tube in place with tip 2.0 cm above carina. Right IJ (internal jugular) CVC (central venous catheter) in place with tip in SVC (superior vena cava). Left chest tube in place. LUNGS AND PLEURA: Low lung volumes accentuating pulmonary vascularity. Ill definition of the aortopulmonary window with left hilar/perihilar density in the region probably from local atelectasis, with mediastinal hematoma a less likely differential diagnostic consideration. If the patient is on postoperative course is atypical, chest CT (computed tomography) could be utilized for further characterization of this left perihilar/paramediastinal prominent appearance. Bandlike subsegmental atelectasis in the right upper lobe. No pleural effusion. No pneumothorax observed. HEART AND MEDIASTINUM:  Low lung volumes  accentuating cardiomediastinal silhouette. Ill definition of the aortopulmonary window with left hilar/perihilar density in the region probably from local atelectasis, with mediastinal hematoma a less likely differential diagnostic consideration. If the patient is on postoperative course is atypical, chest CT could be utilized for further characterization of this left perihilar/paramediastinal prominent appearance. BONES AND SOFT TISSUES: Reversed lordotic projection. No acute osseous abnormality. IMPRESSION: 1. Left perihilar/paramediastinal opacity favoring postoperative atelectasis, with mediastinal hematoma considered less likely; if the postoperative course is atypical, chest CT could be obtained for further characterization. 2. Bandlike subsegmental atelectasis in the right upper lobe. 3. Endotracheal tube, right IJ central venous catheter, and left chest tube in satisfactory positions. 4. Low lung volumes and reversed lordotic projection. Electronically signed by: Ryan Salvage MD 03/21/2024 06:53 PM EST RP Workstation: [id number]   CT Chest Wo Contrast Result Date: 03/18/2024 EXAM: CT CHEST WITHOUT CONTRAST 03/18/2024 02:52:00 PM TECHNIQUE: CT of the chest was performed without the administration of intravenous contrast. Multiplanar reformatted images are provided for review. Automated exposure control, iterative reconstruction, and/or weight based adjustment of the mA/kV was utilized to reduce the radiation dose to as low as reasonably achievable. COMPARISON: CT angiogram chest 07/04/2023. CLINICAL HISTORY: hital hernia FINDINGS: MEDIASTINUM: Heart and pericardium are unremarkable. Mild calcified atherosclerotic disease in the coronary arteries. The central airways are clear. There is a moderate sized hiatal hernia containing the proximal stomach which appears unchanged from prior. LYMPH NODES: No mediastinal, hilar or axillary lymphadenopathy. LUNGS AND PLEURA: There is a new band of  atelectasis/consolidation in the posterior right upper lobe. Previously identified ground glass opacities have resolved in the interval. There are 2 fissural nodules in the right lower lobe measuring up to 8 mm on image 302/46. There is a 2 mm right lower lobe nodule image 302/50 which was not definitely seen on prior examination. Other previously identified ground glass opacities in the right lower lobe have resolved in the interval. There are new clusters of micronodules and tree-in-bud opacities in the right lower lobe. These nodules measure 2 mm or less. Minimal atelectasis seen within the lingula and right middle lobe. No pulmonary edema. No pleural effusion or pneumothorax. SOFT TISSUES/BONES: Again seen are calcifications at the level of the diaphragm centrally, similar to the prior study. No acute abnormality of the bones or soft tissues. UPPER ABDOMEN: Cholecystectomy clips are present. There are scattered colonic diverticula. Limited images of the upper abdomen demonstrates no acute abnormality. IMPRESSION: 1. New band of atelectasis/consolidation in the posterior right upper lobe worrisome for infection . 2. New clusters of micronodules and tree-in-bud opacities in the right lower lobe, measuring 2 mm or less. Findings are likely infectious/inflammatory. 3. Two perifissural nodules in the right lower lobe measuring up to 8 mm; recommend non-contrast chest CT at 3-6 months, then consider an additional non-contrast chest CT at 18-24 months per Fleischner Society Guidelines. 4. A new 2 mm right lower lobe nodule without high-risk features; no routine follow-up imaging is recommended per Fleischner Society Guidelines. 5. Moderate-sized hiatal hernia containing the proximal stomach, unchanged from prior. Electronically signed by: Greig Pique MD 03/18/2024 03:24 PM EST RP Workstation: [id number]   DG Chest 2 View Result Date: 03/17/2024 CLINICAL DATA:  Preoperative respiratory evaluation. EXAM: CHEST - 2  VIEW COMPARISON:  12/09/2023 FINDINGS: The cardiopericardial silhouette is within normal limits for size. The consolidative opacity seen previously in the posterior right upper lobe is less prominent on the PA film today but remains visible on the lateral  projection. Left lung clear. No pleural effusion. The cardiopericardial silhouette is within normal limits for size. Hiatal hernia noted. IMPRESSION: Persistent but less prominent consolidative opacity in the posterior right upper lobe. Persistent collapse/consolidation raises the question of an underlying lesion, potentially central obstructing process. CT chest with contrast recommended to further evaluate. These results will be called to the ordering clinician or representative by the Radiologist Assistant, and communication documented in the PACS or [employer]. Electronically Signed   By: Camellia Candle M.D.   On: 03/17/2024 12:06    Labs: Basic Metabolic Panel: No results for input(s): NA, K, CL, CO2, GLUCOSE, BUN, CREATININE, CALCIUM , MG, PHOS in the last 168 hours. Liver Function Tests: No results for input(s): AST, ALT, ALKPHOS, BILITOT, PROT, ALBUMIN  in the last 168 hours.  CBC: No results for input(s): WBC, NEUTROABS, HGB, HCT, MCV, PLT in the last 168 hours.  CBG: No results for input(s): GLUCAP in the last 168 hours.  Principal Problem:   Status post robot-assisted surgical procedure Active Problems:   Hiatal hernia   VTE plan: I will prescribe outpatient chemical prophylaxis of enoxaparin  due to this increased risk ([email or web address])  Time coordinating discharge: 15 min  "

## 2024-04-12 ENCOUNTER — Telehealth: Payer: Self-pay | Admitting: Dietician

## 2024-04-12 NOTE — Telephone Encounter (Signed)
 RD called pt to verify fluid intake once starting soft, solid proteins 2 week post-bariatric surgery.   Daily Fluid intake:  Daily Protein intake:  Bowel Habits:   Concerns/issues:   Left Voice Message with call back number

## 2024-04-21 ENCOUNTER — Other Ambulatory Visit (HOSPITAL_COMMUNITY): Payer: Self-pay | Admitting: General Surgery

## 2024-04-21 DIAGNOSIS — R1319 Other dysphagia: Secondary | ICD-10-CM

## 2024-04-21 NOTE — Progress Notes (Signed)
 UGI ordered

## 2024-04-27 ENCOUNTER — Ambulatory Visit (HOSPITAL_COMMUNITY)
Admission: RE | Admit: 2024-04-27 | Discharge: 2024-04-27 | Disposition: A | Discharge status: Home / Self Care | Source: Ambulatory Visit | Attending: General Surgery | Admitting: General Surgery

## 2024-04-27 ENCOUNTER — Other Ambulatory Visit (HOSPITAL_COMMUNITY): Payer: Self-pay | Admitting: General Surgery

## 2024-04-27 DIAGNOSIS — R1319 Other dysphagia: Secondary | ICD-10-CM | POA: Insufficient documentation

## 2024-04-28 ENCOUNTER — Ambulatory Visit: Payer: Self-pay

## 2024-04-28 VITALS — BP 150/80 | HR 95 | Resp 20 | Ht 71.0 in | Wt 324.0 lb

## 2024-04-28 DIAGNOSIS — K449 Diaphragmatic hernia without obstruction or gangrene: Secondary | ICD-10-CM

## 2024-04-28 DIAGNOSIS — Z09 Encounter for follow-up examination after completed treatment for conditions other than malignant neoplasm: Secondary | ICD-10-CM

## 2024-04-28 NOTE — Progress Notes (Signed)
 "     [address] [name] [place] [id number]             [phone]       HPI:  Patient returns for routine postoperative follow-up having undergone a co-surgery with Robotic assisted left video thoracoscopy for paraesophageal hernia repair with Dr. Shyrl and robotic Roux-en-Y gastric bypass, insertion of mesh, and upper endoscopy with Dr. Stevie on 03/21/2024.   [name] reports that [name] has been doing well. [name] is having minimal incisional pain.  [name] continues to follow her dietary guidelines for roux-en-y bypass surgery. [name] has been trying to stay active. [name] has had some regurgitation of mucus after certain meals. This does not happen everyday or with every meal.  [name] had a barium swallow test done yesterday.  [name] had esophageal dysmotility which was significant while prone.     Allergies as of 04/28/2024       Reactions   Haemophilus B Polysaccharide Vaccine Swelling, Other (See Comments)   Redness at injection site (approximatly golf ball size) hot to the touch   Lisinopril  Hives   Lisinopril -hydrochlorothiazide  Hives, Other (See Comments)   Welts, too   Wound Dressings Hives   Alpha-gal Diarrhea, Nausea And Vomiting, Other (See Comments)   Blisters inside of mouth, too Has tolerated lovenox    Bovine (beef) Protein-containing Drug Products Diarrhea, Nausea And Vomiting, Other (See Comments)   Alpha-gal   Influenza Vaccines    States her arm had a knot and turned red   Adhesive [tape] Rash, Other (See Comments)   Also, blisters   Wound Dressing Adhesive Rash, Other (See Comments)   Blisters, too        Medication List        Accurate as of April 28, 2024  3:32 PM. If you have any questions, ask your nurse or doctor.          STOP taking these medications    levocetirizine 5 MG tablet Commonly known as: XYZAL    Mounjaro  10 MG/0.5ML Pen Generic drug: tirzepatide    oxyCODONE  5 MG immediate release tablet Commonly known as: Oxy  IR/ROXICODONE        TAKE these medications    acetaminophen  500 MG tablet Commonly known as: TYLENOL  Take 1 tablet (500 mg total) by mouth every 6 (six) hours as needed for mild pain (pain score 1-3), moderate pain (pain score 4-6), headache or fever.   albuterol  108 (90 Base) MCG/ACT inhaler Commonly known as: VENTOLIN  HFA Inhale 1-2 puffs into the lungs every 6 (six) hours as needed for wheezing or shortness of breath. What changed: when to take this   ALPRAZolam  1 MG tablet Commonly known as: XANAX  Take 1 mg by mouth in the morning and at bedtime.   atorvastatin  40 MG tablet Commonly known as: LIPITOR Take 1 tablet (40 mg total) by mouth daily.   azelastine  0.1 % nasal spray Commonly known as: ASTELIN  Place 2 sprays into both nostrils 2 (two) times daily as needed for rhinitis or allergies.   baclofen  10 MG tablet Commonly known as: LIORESAL  Take 1 tablet (10 mg total) by mouth 2 (two) times daily as needed (migraine).   Black Cohosh 540 MG Caps Take 540 mg by mouth at bedtime.   clopidogrel  75 MG tablet Commonly known as: PLAVIX  Take 75 mg by mouth daily.   diltiazem  120 MG 24 hr capsule Commonly known as: CARDIZEM  CD Take 120 mg by mouth daily.   enoxaparin  40  MG/0.4ML injection Commonly known as: LOVENOX  Inject 0.4 mLs (40 mg total) into the skin every 12 (twelve) hours.   fluticasone  50 MCG/ACT nasal spray Commonly known as: FLONASE Place 2 sprays into both nostrils daily as needed for allergies or rhinitis.   fluticasone -salmeterol 115-21 MCG/ACT inhaler Commonly known as: Advair HFA Inhale 2 puffs into the lungs 2 (two) times daily.   gabapentin  300 MG capsule Commonly known as: NEURONTIN  Take 300 mg by mouth 3 (three) times daily.   magnesium  oxide 400 (240 Mg) MG tablet Commonly known as: MAG-OX Take 400 mg by mouth daily.   Multi-Vitamin tablet Take 1 tablet by mouth daily with breakfast.   nebivolol  5 MG tablet Commonly known as:  BYSTOLIC  Take 5 mg by mouth at bedtime.   ondansetron  4 MG disintegrating tablet Commonly known as: ZOFRAN -ODT Take 1 tablet (4 mg total) by mouth every 6 (six) hours as needed for nausea or vomiting.   pantoprazole  40 MG tablet Commonly known as: PROTONIX  Take 40 mg by mouth in the morning and at bedtime.   promethazine  25 MG tablet Commonly known as: PHENERGAN  Take 25 mg by mouth daily as needed for nausea or vomiting.   QUEtiapine  300 MG tablet Commonly known as: SEROQUEL  Take 300 mg by mouth at bedtime.   Stelara 90 MG/ML Sosy injection Generic drug: ustekinumab Inject 90 mg into the skin every 3 (three) months.   SUMAtriptan  20 MG/ACT nasal spray Commonly known as: IMITREX  Place 1 spray (20 mg total) into the nose as needed for migraine or headache. May repeat in 2 hours if headache persists or recurs.   venlafaxine  XR 75 MG 24 hr capsule Commonly known as: EFFEXOR -XR Take 75 mg by mouth daily with breakfast.   venlafaxine  XR 150 MG 24 hr capsule Commonly known as: EFFEXOR -XR Take 150 mg by mouth daily with breakfast.         ROS Review of Systems  Respiratory:  Negative for cough and shortness of breath.   Cardiovascular:  Negative for chest pain, palpitations and leg swelling.  Gastrointestinal:  Negative for abdominal pain, constipation, diarrhea, heartburn and nausea.      BP (!) 150/80   Pulse 95   Resp 20   Ht 5' 11 (1.803 m)   Wt (!) 324 lb (147 kg)   SpO2 96% Comment: RA  BMI 45.19 kg/m    Physical Exam Constitutional:      Appearance: Normal appearance.  HENT:     Head: Normocephalic and atraumatic.  Cardiovascular:     Rate and Rhythm: Normal rate and regular rhythm.     Heart sounds: Normal heart sounds, S1 normal and S2 normal.  Pulmonary:     Effort: Pulmonary effort is normal.     Breath sounds: Normal breath sounds.  Musculoskeletal:     Cervical back: Normal range of motion.  Skin:    General: Skin is warm and dry.      Comments: Incision sites healed  Neurological:     General: No focal deficit present.     Mental Status: [name] is alert.        Assessment/Plan:  Surgery follow-up examination/ s/p Robotic assisted left video thoracoscopy and Hiatal hernia -Overall doing well postoperatively and is not requiring anything for pain -[name] is to continue with pantoprazole   -[name] is following dietary changes after roux-en-y as directed.  [name] has had some dietary classes and is hitting her daily protein goals -[name] is to increase her activity as tolerated.  Discussed that since incision sites are healed [name] can water walk and do water aerobics.  -[name] is to continue with dietary changes.  -CT scan prior to surgery showed pulmonary nodules. Pulmonology has reached out for further surveillance and follow up for this.  -Follow up with [facility] as needed    Manuelita CHRISTELLA Rough, PA-C 3:32 PM 04/28/24  "

## 2024-06-20 ENCOUNTER — Encounter: Admitting: Dietician
